# Patient Record
Sex: Male | Born: 1950
Health system: Southern US, Community
[De-identification: ages and names within clinical notes are randomized; demographics above are authoritative.]

## PROBLEM LIST (undated history)

## (undated) DIAGNOSIS — E785 Hyperlipidemia, unspecified: Secondary | ICD-10-CM

## (undated) DIAGNOSIS — I5042 Chronic combined systolic (congestive) and diastolic (congestive) heart failure: Secondary | ICD-10-CM

## (undated) DIAGNOSIS — E119 Type 2 diabetes mellitus without complications: Secondary | ICD-10-CM

## (undated) DIAGNOSIS — I10 Essential (primary) hypertension: Secondary | ICD-10-CM

## (undated) DIAGNOSIS — F419 Anxiety disorder, unspecified: Secondary | ICD-10-CM

## (undated) DIAGNOSIS — I739 Peripheral vascular disease, unspecified: Secondary | ICD-10-CM

## (undated) DIAGNOSIS — J189 Pneumonia, unspecified organism: Secondary | ICD-10-CM

## (undated) DIAGNOSIS — I2510 Atherosclerotic heart disease of native coronary artery without angina pectoris: Secondary | ICD-10-CM

## (undated) DIAGNOSIS — M1990 Unspecified osteoarthritis, unspecified site: Secondary | ICD-10-CM

## (undated) DIAGNOSIS — I214 Non-ST elevation (NSTEMI) myocardial infarction: Secondary | ICD-10-CM

## (undated) DIAGNOSIS — I25119 Atherosclerotic heart disease of native coronary artery with unspecified angina pectoris: Secondary | ICD-10-CM

## (undated) DIAGNOSIS — R079 Chest pain, unspecified: Secondary | ICD-10-CM

## (undated) HISTORY — DX: Peripheral vascular disease, unspecified: I73.9

## (undated) HISTORY — PX: CORONARY ANGIOPLASTY WITH STENT PLACEMENT: SHX49

## (undated) HISTORY — PX: CORONARY ARTERY BYPASS GRAFT: SHX141

## (undated) HISTORY — DX: Type 2 diabetes mellitus without complications: E11.9

## (undated) HISTORY — DX: Non-ST elevation (NSTEMI) myocardial infarction: I21.4

## (undated) HISTORY — DX: Atherosclerotic heart disease of native coronary artery with unspecified angina pectoris: I25.119

## (undated) HISTORY — PX: COLONOSCOPY: SHX174

## (undated) HISTORY — PX: SHOULDER SURGERY: SHX246

---

## 2000-07-03 ENCOUNTER — Encounter: Admission: RE | Admit: 2000-07-03 | Discharge: 2000-10-01 | Payer: Self-pay | Admitting: Internal Medicine

## 2000-07-11 ENCOUNTER — Encounter: Payer: Self-pay | Admitting: Neurology

## 2000-07-11 ENCOUNTER — Encounter: Admission: RE | Admit: 2000-07-11 | Discharge: 2000-07-11 | Payer: Self-pay | Admitting: Neurology

## 2004-07-19 ENCOUNTER — Encounter: Admission: RE | Admit: 2004-07-19 | Discharge: 2004-07-19 | Payer: Self-pay | Admitting: Orthopedic Surgery

## 2004-07-21 ENCOUNTER — Ambulatory Visit (HOSPITAL_BASED_OUTPATIENT_CLINIC_OR_DEPARTMENT_OTHER): Admission: RE | Admit: 2004-07-21 | Discharge: 2004-07-21 | Payer: Self-pay | Admitting: Orthopedic Surgery

## 2004-07-21 ENCOUNTER — Ambulatory Visit (HOSPITAL_COMMUNITY): Admission: RE | Admit: 2004-07-21 | Discharge: 2004-07-21 | Payer: Self-pay | Admitting: Orthopedic Surgery

## 2008-04-15 ENCOUNTER — Encounter: Admission: RE | Admit: 2008-04-15 | Discharge: 2008-04-15 | Payer: Self-pay | Admitting: Interventional Cardiology

## 2008-04-18 ENCOUNTER — Inpatient Hospital Stay (HOSPITAL_BASED_OUTPATIENT_CLINIC_OR_DEPARTMENT_OTHER): Admission: RE | Admit: 2008-04-18 | Discharge: 2008-04-18 | Payer: Self-pay | Admitting: Internal Medicine

## 2008-04-21 ENCOUNTER — Ambulatory Visit: Payer: Self-pay | Admitting: Thoracic Surgery (Cardiothoracic Vascular Surgery)

## 2008-04-23 ENCOUNTER — Encounter: Payer: Self-pay | Admitting: Thoracic Surgery (Cardiothoracic Vascular Surgery)

## 2008-04-23 ENCOUNTER — Ambulatory Visit (HOSPITAL_COMMUNITY)
Admission: RE | Admit: 2008-04-23 | Discharge: 2008-04-23 | Payer: Self-pay | Admitting: Thoracic Surgery (Cardiothoracic Vascular Surgery)

## 2008-04-25 ENCOUNTER — Ambulatory Visit: Payer: Self-pay | Admitting: Thoracic Surgery (Cardiothoracic Vascular Surgery)

## 2008-04-25 ENCOUNTER — Inpatient Hospital Stay (HOSPITAL_COMMUNITY)
Admission: RE | Admit: 2008-04-25 | Discharge: 2008-04-29 | Payer: Self-pay | Admitting: Thoracic Surgery (Cardiothoracic Vascular Surgery)

## 2008-05-02 ENCOUNTER — Ambulatory Visit: Payer: Self-pay | Admitting: Thoracic Surgery (Cardiothoracic Vascular Surgery)

## 2008-05-19 ENCOUNTER — Ambulatory Visit: Payer: Self-pay | Admitting: Thoracic Surgery (Cardiothoracic Vascular Surgery)

## 2008-05-19 ENCOUNTER — Encounter
Admission: RE | Admit: 2008-05-19 | Discharge: 2008-05-19 | Payer: Self-pay | Admitting: Thoracic Surgery (Cardiothoracic Vascular Surgery)

## 2008-05-21 ENCOUNTER — Inpatient Hospital Stay (HOSPITAL_COMMUNITY): Admission: EM | Admit: 2008-05-21 | Discharge: 2008-05-23 | Payer: Self-pay | Admitting: Emergency Medicine

## 2008-05-21 ENCOUNTER — Ambulatory Visit: Payer: Self-pay | Admitting: Cardiology

## 2008-05-30 ENCOUNTER — Ambulatory Visit: Payer: Self-pay | Admitting: Cardiothoracic Surgery

## 2008-07-28 ENCOUNTER — Ambulatory Visit (HOSPITAL_COMMUNITY): Admission: RE | Admit: 2008-07-28 | Discharge: 2008-07-28 | Payer: Self-pay | Admitting: Interventional Cardiology

## 2010-03-07 ENCOUNTER — Emergency Department (HOSPITAL_COMMUNITY): Payer: BC Managed Care – PPO

## 2010-03-07 ENCOUNTER — Emergency Department (HOSPITAL_COMMUNITY)
Admission: EM | Admit: 2010-03-07 | Discharge: 2010-03-07 | Disposition: A | Discharge status: Home / Self Care | Payer: BC Managed Care – PPO | Attending: Emergency Medicine | Admitting: Emergency Medicine

## 2010-03-07 DIAGNOSIS — R002 Palpitations: Secondary | ICD-10-CM | POA: Insufficient documentation

## 2010-03-07 DIAGNOSIS — I10 Essential (primary) hypertension: Secondary | ICD-10-CM | POA: Insufficient documentation

## 2010-03-07 DIAGNOSIS — R0602 Shortness of breath: Secondary | ICD-10-CM | POA: Insufficient documentation

## 2010-03-07 DIAGNOSIS — R0989 Other specified symptoms and signs involving the circulatory and respiratory systems: Secondary | ICD-10-CM | POA: Insufficient documentation

## 2010-03-07 DIAGNOSIS — Z7982 Long term (current) use of aspirin: Secondary | ICD-10-CM | POA: Insufficient documentation

## 2010-03-07 DIAGNOSIS — E7800 Pure hypercholesterolemia, unspecified: Secondary | ICD-10-CM | POA: Insufficient documentation

## 2010-03-07 DIAGNOSIS — R0609 Other forms of dyspnea: Secondary | ICD-10-CM | POA: Insufficient documentation

## 2010-03-07 DIAGNOSIS — Z79899 Other long term (current) drug therapy: Secondary | ICD-10-CM | POA: Insufficient documentation

## 2010-03-07 DIAGNOSIS — I252 Old myocardial infarction: Secondary | ICD-10-CM | POA: Insufficient documentation

## 2010-03-07 DIAGNOSIS — Z951 Presence of aortocoronary bypass graft: Secondary | ICD-10-CM | POA: Insufficient documentation

## 2010-03-07 DIAGNOSIS — E119 Type 2 diabetes mellitus without complications: Secondary | ICD-10-CM | POA: Insufficient documentation

## 2010-03-07 LAB — DIFFERENTIAL
Lymphs Abs: 1.8 10*3/uL (ref 0.7–4.0)
Monocytes Absolute: 0.9 10*3/uL (ref 0.1–1.0)
Monocytes Relative: 10 % (ref 3–12)
Neutro Abs: 6.1 10*3/uL (ref 1.7–7.7)
Neutrophils Relative %: 67 % (ref 43–77)

## 2010-03-07 LAB — BASIC METABOLIC PANEL
Chloride: 100 mEq/L (ref 96–112)
GFR calc Af Amer: >60 mL/min (ref >60)
Potassium: 3.9 mEq/L (ref 3.5–5.1)
Sodium: 133 mEq/L — ABNORMAL LOW (ref 135–145)

## 2010-03-07 LAB — CBC
Hemoglobin: 15.3 g/dL (ref 13.0–17.0)
MCH: 30.5 pg (ref 26.0–34.0)
MCHC: 35.7 g/dL (ref 30.0–36.0)
MCV: 85.5 fL (ref 78.0–100.0)
RBC: 5.02 MIL/uL (ref 4.22–5.81)

## 2010-03-07 LAB — POCT CARDIAC MARKERS: Troponin i, poc: <0.05 ng/mL (ref 0.00–0.09)

## 2010-04-11 LAB — GLUCOSE, CAPILLARY
Glucose-Capillary: 133 mg/dL — ABNORMAL HIGH (ref 70–99)
Glucose-Capillary: 173 mg/dL — ABNORMAL HIGH (ref 70–99)

## 2010-04-13 LAB — BASIC METABOLIC PANEL
BUN: 7 mg/dL (ref 6–23)
CO2: 23 mEq/L (ref 19–32)
Chloride: 102 mEq/L (ref 96–112)
Chloride: 97 mEq/L (ref 96–112)
GFR calc Af Amer: >60 mL/min (ref >60)
Glucose, Bld: 173 mg/dL — ABNORMAL HIGH (ref 70–99)
Potassium: 4.1 mEq/L (ref 3.5–5.1)
Potassium: 4.4 mEq/L (ref 3.5–5.1)
Sodium: 134 mEq/L — ABNORMAL LOW (ref 135–145)

## 2010-04-13 LAB — COMPREHENSIVE METABOLIC PANEL
ALT: 58 U/L — ABNORMAL HIGH (ref 0–53)
AST: 33 U/L (ref 0–37)
Alkaline Phosphatase: 118 U/L — ABNORMAL HIGH (ref 39–117)
Alkaline Phosphatase: 126 U/L — ABNORMAL HIGH (ref 39–117)
BUN: 9 mg/dL (ref 6–23)
CO2: 28 mEq/L (ref 19–32)
Calcium: 9.3 mg/dL (ref 8.4–10.5)
GFR calc Af Amer: >60 mL/min (ref >60)
GFR calc non Af Amer: >60 mL/min (ref >60)
Glucose, Bld: 181 mg/dL — ABNORMAL HIGH (ref 70–99)
Potassium: 4.1 mEq/L (ref 3.5–5.1)
Potassium: 4.2 mEq/L (ref 3.5–5.1)
Sodium: 129 mEq/L — ABNORMAL LOW (ref 135–145)
Total Bilirubin: 0.5 mg/dL (ref 0.3–1.2)
Total Protein: 7.1 g/dL (ref 6.0–8.3)
Total Protein: 7.1 g/dL (ref 6.0–8.3)

## 2010-04-13 LAB — CARDIAC PANEL(CRET KIN+CKTOT+MB+TROPI)
CK, MB: 1.7 ng/mL (ref 0.3–4.0)
Relative Index: INVALID (ref 0.0–2.5)
Relative Index: INVALID (ref 0.0–2.5)
Relative Index: INVALID (ref 0.0–2.5)
Total CK: 26 U/L (ref 7–232)
Total CK: 35 U/L (ref 7–232)

## 2010-04-13 LAB — DIFFERENTIAL
Basophils Absolute: 0 10*3/uL (ref 0.0–0.1)
Basophils Relative: 1 % (ref 0–1)
Monocytes Relative: 8 % (ref 3–12)
Neutro Abs: 6.2 10*3/uL (ref 1.7–7.7)
Neutrophils Relative %: 74 % (ref 43–77)

## 2010-04-13 LAB — HEPARIN LEVEL (UNFRACTIONATED): Heparin Unfractionated: 0.23 IU/mL — ABNORMAL LOW (ref 0.30–0.70)

## 2010-04-13 LAB — CBC
Hemoglobin: 12.9 g/dL — ABNORMAL LOW (ref 13.0–17.0)
MCHC: 34.2 g/dL (ref 30.0–36.0)
MCHC: 34.6 g/dL (ref 30.0–36.0)
Platelets: 187 10*3/uL (ref 150–400)
Platelets: 218 10*3/uL (ref 150–400)
RDW: 12.9 % (ref 11.5–15.5)
RDW: 13 % (ref 11.5–15.5)

## 2010-04-13 LAB — GLUCOSE, CAPILLARY: Glucose-Capillary: 140 mg/dL — ABNORMAL HIGH (ref 70–99)

## 2010-04-13 LAB — CK TOTAL AND CKMB (NOT AT ARMC)
Relative Index: INVALID (ref 0.0–2.5)
Total CK: 43 U/L (ref 7–232)

## 2010-04-13 LAB — PROTIME-INR
INR: 1.1 (ref 0.00–1.49)
Prothrombin Time: 14.3 seconds (ref 11.6–15.2)

## 2010-04-14 LAB — GLUCOSE, CAPILLARY
Glucose-Capillary: 111 mg/dL — ABNORMAL HIGH (ref 70–99)
Glucose-Capillary: 132 mg/dL — ABNORMAL HIGH (ref 70–99)
Glucose-Capillary: 136 mg/dL — ABNORMAL HIGH (ref 70–99)
Glucose-Capillary: 154 mg/dL — ABNORMAL HIGH (ref 70–99)
Glucose-Capillary: 157 mg/dL — ABNORMAL HIGH (ref 70–99)
Glucose-Capillary: 165 mg/dL — ABNORMAL HIGH (ref 70–99)
Glucose-Capillary: 167 mg/dL — ABNORMAL HIGH (ref 70–99)
Glucose-Capillary: 168 mg/dL — ABNORMAL HIGH (ref 70–99)
Glucose-Capillary: 174 mg/dL — ABNORMAL HIGH (ref 70–99)
Glucose-Capillary: 178 mg/dL — ABNORMAL HIGH (ref 70–99)
Glucose-Capillary: 179 mg/dL — ABNORMAL HIGH (ref 70–99)
Glucose-Capillary: 183 mg/dL — ABNORMAL HIGH (ref 70–99)
Glucose-Capillary: 98 mg/dL (ref 70–99)

## 2010-04-14 LAB — CBC
HCT: 26.4 % — ABNORMAL LOW (ref 39.0–52.0)
HCT: 28.7 % — ABNORMAL LOW (ref 39.0–52.0)
HCT: 31.1 % — ABNORMAL LOW (ref 39.0–52.0)
HCT: 31.2 % — ABNORMAL LOW (ref 39.0–52.0)
HCT: 31.7 % — ABNORMAL LOW (ref 39.0–52.0)
Hemoglobin: 11 g/dL — ABNORMAL LOW (ref 13.0–17.0)
Hemoglobin: 11 g/dL — ABNORMAL LOW (ref 13.0–17.0)
Hemoglobin: 15.8 g/dL (ref 13.0–17.0)
Hemoglobin: 9.3 g/dL — ABNORMAL LOW (ref 13.0–17.0)
Hemoglobin: 9.6 g/dL — ABNORMAL LOW (ref 13.0–17.0)
Hemoglobin: 9.9 g/dL — ABNORMAL LOW (ref 13.0–17.0)
MCHC: 35.3 g/dL (ref 30.0–36.0)
MCHC: 35.3 g/dL (ref 30.0–36.0)
MCHC: 35.4 g/dL (ref 30.0–36.0)
MCV: 88 fL (ref 78.0–100.0)
MCV: 88.3 fL (ref 78.0–100.0)
MCV: 88.7 fL (ref 78.0–100.0)
MCV: 89.8 fL (ref 78.0–100.0)
Platelets: 101 10*3/uL — ABNORMAL LOW (ref 150–400)
RBC: 3 MIL/uL — ABNORMAL LOW (ref 4.22–5.81)
RBC: 3.06 MIL/uL — ABNORMAL LOW (ref 4.22–5.81)
RBC: 3.53 MIL/uL — ABNORMAL LOW (ref 4.22–5.81)
RBC: 5.17 MIL/uL (ref 4.22–5.81)
RDW: 12 % (ref 11.5–15.5)
RDW: 12.8 % (ref 11.5–15.5)
WBC: 10 10*3/uL (ref 4.0–10.5)
WBC: 11.7 10*3/uL — ABNORMAL HIGH (ref 4.0–10.5)
WBC: 9.1 10*3/uL (ref 4.0–10.5)
WBC: 9.8 10*3/uL (ref 4.0–10.5)

## 2010-04-14 LAB — COMPREHENSIVE METABOLIC PANEL
ALT: 44 U/L (ref 0–53)
Alkaline Phosphatase: 76 U/L (ref 39–117)
CO2: 19 mEq/L (ref 19–32)
Glucose, Bld: 147 mg/dL — ABNORMAL HIGH (ref 70–99)
Potassium: 4.7 mEq/L (ref 3.5–5.1)
Sodium: 132 mEq/L — ABNORMAL LOW (ref 135–145)
Total Protein: 6.8 g/dL (ref 6.0–8.3)

## 2010-04-14 LAB — POCT I-STAT GLUCOSE: Glucose, Bld: 153 mg/dL — ABNORMAL HIGH (ref 70–99)

## 2010-04-14 LAB — POCT I-STAT, CHEM 8
BUN: 10 mg/dL (ref 6–23)
BUN: 9 mg/dL (ref 6–23)
Calcium, Ion: 1.2 mmol/L (ref 1.12–1.32)
Chloride: 103 mEq/L (ref 96–112)
Chloride: 97 mEq/L (ref 96–112)
Creatinine, Ser: 0.8 mg/dL (ref 0.4–1.5)
Glucose, Bld: 155 mg/dL — ABNORMAL HIGH (ref 70–99)
HCT: 33 % — ABNORMAL LOW (ref 39.0–52.0)
Potassium: 4.3 mEq/L (ref 3.5–5.1)
Potassium: 5.1 mEq/L (ref 3.5–5.1)
Sodium: 133 mEq/L — ABNORMAL LOW (ref 135–145)
TCO2: 20 mmol/L (ref 0–100)

## 2010-04-14 LAB — POCT I-STAT 3, ART BLOOD GAS (G3+)
Acid-base deficit: 3 mmol/L — ABNORMAL HIGH (ref 0.0–2.0)
Bicarbonate: 19.3 mEq/L — ABNORMAL LOW (ref 20.0–24.0)
O2 Saturation: 99 %
Patient temperature: 38.3
TCO2: 20 mmol/L (ref 0–100)
pCO2 arterial: 31.6 mmHg — ABNORMAL LOW (ref 35.0–45.0)
pCO2 arterial: 39.4 mmHg (ref 35.0–45.0)
pH, Arterial: 7.361 (ref 7.350–7.450)
pO2, Arterial: 351 mmHg — ABNORMAL HIGH (ref 80.0–100.0)

## 2010-04-14 LAB — BLOOD GAS, ARTERIAL
Drawn by: 181601
FIO2: 0.21 %
pCO2 arterial: 34 mmHg — ABNORMAL LOW (ref 35.0–45.0)
pO2, Arterial: 84.5 mmHg (ref 80.0–100.0)

## 2010-04-14 LAB — MAGNESIUM: Magnesium: 2.1 mg/dL (ref 1.5–2.5)

## 2010-04-14 LAB — POCT I-STAT 4, (NA,K, GLUC, HGB,HCT)
Glucose, Bld: 115 mg/dL — ABNORMAL HIGH (ref 70–99)
Glucose, Bld: 118 mg/dL — ABNORMAL HIGH (ref 70–99)
Glucose, Bld: 122 mg/dL — ABNORMAL HIGH (ref 70–99)
Glucose, Bld: 148 mg/dL — ABNORMAL HIGH (ref 70–99)
HCT: 31 % — ABNORMAL LOW (ref 39.0–52.0)
HCT: 38 % — ABNORMAL LOW (ref 39.0–52.0)
Hemoglobin: 14.6 g/dL (ref 13.0–17.0)
Hemoglobin: 8.8 g/dL — ABNORMAL LOW (ref 13.0–17.0)
Potassium: 4 mEq/L (ref 3.5–5.1)
Potassium: 4.3 mEq/L (ref 3.5–5.1)
Potassium: 4.9 mEq/L (ref 3.5–5.1)
Sodium: 133 mEq/L — ABNORMAL LOW (ref 135–145)
Sodium: 134 mEq/L — ABNORMAL LOW (ref 135–145)
Sodium: 135 mEq/L (ref 135–145)

## 2010-04-14 LAB — TYPE AND SCREEN: Antibody Screen: NEGATIVE

## 2010-04-14 LAB — BASIC METABOLIC PANEL
BUN: 13 mg/dL (ref 6–23)
Calcium: 8 mg/dL — ABNORMAL LOW (ref 8.4–10.5)
Chloride: 97 mEq/L (ref 96–112)
GFR calc Af Amer: >60 mL/min (ref >60)
GFR calc non Af Amer: >60 mL/min (ref >60)
GFR calc non Af Amer: >60 mL/min (ref >60)
Potassium: 4 mEq/L (ref 3.5–5.1)
Potassium: 4.5 mEq/L (ref 3.5–5.1)
Sodium: 129 mEq/L — ABNORMAL LOW (ref 135–145)
Sodium: 131 mEq/L — ABNORMAL LOW (ref 135–145)

## 2010-04-14 LAB — CREATININE, SERUM
GFR calc Af Amer: >60 mL/min (ref >60)
GFR calc non Af Amer: >60 mL/min (ref >60)

## 2010-04-14 LAB — HEMOGLOBIN AND HEMATOCRIT, BLOOD: Hemoglobin: 9.5 g/dL — ABNORMAL LOW (ref 13.0–17.0)

## 2010-04-14 LAB — URINALYSIS, ROUTINE W REFLEX MICROSCOPIC
Bilirubin Urine: NEGATIVE
Nitrite: NEGATIVE
Specific Gravity, Urine: 1.02 (ref 1.005–1.030)
pH: 6 (ref 5.0–8.0)

## 2010-04-14 LAB — APTT: aPTT: 25 seconds (ref 24–37)

## 2010-04-14 LAB — HEMOGLOBIN A1C: Hgb A1c MFr Bld: 6.6 % — ABNORMAL HIGH (ref 4.6–6.1)

## 2010-05-18 NOTE — Op Note (Signed)
NAMEALEXIE, SAMSON NO.:  [id number]   MEDICAL RECORD NO.:  [id number]          PATIENT TYPE:  INP   LOCATION:  2311                         FACILITY:  [facility]   PHYSICIAN:  Salvatore Decent. Dorris Fetch, M.D.DATE OF BIRTH:  30-Mar-1950   DATE OF PROCEDURE:  04/25/2008  DATE OF DISCHARGE:                               OPERATIVE REPORT   PREOPERATIVE DIAGNOSIS:  Severe three-vessel coronary disease with  progressive angina.   POSTOPERATIVE DIAGNOSIS:  Severe three-vessel coronary disease with  progressive angina.   PROCEDURE:  Median sternotomy extracorporeal circulation coronary artery  bypass graft x5 (left internal mammary artery to left anterior  descending, saphenous vein graft to first diagonal, saphenous vein graft  to ramus intermedius, saphenous vein graft to posterior descending, left  radial artery to obtuse marginal), and endoscopic vein harvest, right  thigh.   SURGEON:  Salvatore Decent. Dorris Fetch, MD   ASSISTANT:  Doree Fudge, PA   SECOND ASSISTANT:  Stephanie Acre. Dasovich, PAC   ANESTHESIA:  General.   FINDINGS:  Left radial, small but good quality; left mammary, large,  good quality; saphenous vein, good quality; ramus intermedius  intramyocardial coronaries, good quality targets with the exception of  the obtuse marginal which was a 1-mm fair-to-poor quality target.   CLINICAL NOTE:  Mr. Lia is a 60 year old gentleman with known  coronary disease who has had recent progression of symptoms from class  II to class III angina.  He had a positive stress test and cardiac  catheterization was found to have severe three-vessel coronary disease.  He was advised to undergo coronary artery bypass grafting.  The  indications, risks, benefits, and alternatives were discussed in detail  with the patient.  He understood and accepted the risks and agreed to  proceed.   OPERATIVE NOTE:  Mr. Fristoe was brought to the preop holding area on  April 25, 2008.   There lines were placed by Anesthesia for monitoring  arterial, central venous, and pulmonary arterial pressures.  Intravenous  antibiotics were administered.  The patient was taken to the operating  room, anesthetized, and intubated.  A Foley catheter was placed.  The  chest, abdomen, and legs were prepped and draped in the usual fashion.  An incision was made to the medial aspect of the right leg at the level  of the greater saphenous vein was harvested endoscopically from the  right thigh and 2000 units of heparin was administered during the vessel  harvest.  Simultaneously, a incision was made in the volar aspect of the  left wrist and extended just below the antecubital fossa.  The left  radial artery was harvested using the harmonic scalpel.  The patient had  confirmed normal Allen's test preoperatively.  There was a good distal  pulse with proximal radial occlusion.   A median sternotomy was performed.  The left internal mammary artery was  harvested using standard technique.  There was a large caliber good  quality vessel.  There was excellent flow through the distal end of the  mammary artery when divided.   After harvesting the conduits, the left  radial artery incision was  closed.  The arm was tucked.  The leg incision was closed in standard  fashion.  The pericardium was opened.  The ascending aorta was  inspected.  There was no atherosclerotic disease.  The aorta was  cannulated via concentric two Ethibond pledgeted and purse-string  sutures.  A dual-stage venous cannula was placed via purse-string suture  at the right atrial appendage.  Cardiopulmonary bypass was instituted  and the patient was cooled to 32 degrees Celsius.  The coronary arteries  were inspected and anastomotic sites were chosen.  The conduits were  inspected and cut to length.  A foam pad was placed in the pericardium  to protect the left phrenic nerve and insulate the heart.  A temperature  probe was  placed in myocardial septum and a cardioplegic cannula was  placed in the ascending aorta.   The aorta was cross-clamped.  The left ventricle was emptied via the  aortic root and cardiac arrest was then achieved with a combination of  cold antegrade blood cardioplegia and topical iced saline.  A 600 mL of  cardioplegia was administered initially.  The myocardial septal  temperature fell to 9 degrees Celsius.  The following distal anastomoses  were performed.   First a reversed saphenous vein graft was placed end-to-side to the  posterior descending branch of the right coronary.  This was a 1.5 mm  vessel.  It was a good quality target.  The vein graft was anastomosed  end-to-side with a running 7-0 Prolene suture.  At the completion of  each vein graft, it was probed proximally and distally to ensure  patency.  Cardioplegia then was administered to assess flow and  hemostasis.   Next, a reverse saphenous vein graft was placed end-to-side to the ramus  intermedius.  This was a 1.5 mm vessel that was plaquing but was still  good quality.  This vessel was intramyocardial.  The vein graft was  anastomosed end-to-side with a running 7-0 Prolene suture.   Next, a reverse saphenous vein graft was placed end-to-side to the first  diagonal branch of the LAD.  This was a 1.5 mm vessel that was of good  quality and had a 75% stenosis proximally.  The vein was anastomosed end-  to-side with a running 7-0 Prolene suture.  There was good flow through  the graft with good hemostasis.   Next, the radial artery was beveled at its distal end.  As noted, it was  a small vessel but had had good flow in the arm and divided distally and  was a suitable size match for the small obtuse marginal branch.  There  were multiple obtuse marginal branches.  This vessel was nearly worn  that was even close to consideration for grafting.  It was diffusely  diseased proximally but was a 1-mm fair-quality target  distally.  The  radial to obtuse marginal anastomosis was performed with a running 8-0  Prolene suture.  It was probed proximally and distally to ensure patency  with cardioplegia administration down the aortic root.  There was good  backbleeding from the radial artery.   Next, the left internal mammary artery was brought through a window in  the pericardium.  The distal end was beveled and was anastomosed end-to-  side to the LAD.  The LAD was a 2-mm good quality target site  anastomosis.  The mammary was a 2.5 mm good quality conduit.  The  anastomosis was performed with a  running 8-0 Prolene suture.  At the  completion of the mammary to LAD anastomosis, the bulldog clamp was  briefly removed to inspect for hemostasis.  Immediate rapid septal  rewarming was noted.  The bulldog clamp was replaced and the mammary  pedicle was tacked to the epicardial surface of the heart with 6-0  Prolene sutures.   Additional cardioplegia was administered.  The vein grafts were cut to  length.  The proximal vein graft anastomoses were performed to 4.5 mm  punch aortotomies with running 6-0 Prolene sutures.  The radial artery  was too small to anastomose directly to the aorta.  At the completion of  final proximal anastomosis, the patient was placed in Trendelenburg  position.  Lidocaine was administered.  The aortic root was de-aired and  the aortic cross-clamp was removed.  Total cross-clamp time was 75  minutes.  The patient required a single defibrillation with 20 joules  and remained in sinus rhythm thereafter.  Bulldog clamps were placed  proximally and distally on the vein graft to the ramus intermedius.  A  venotomy was made at the proximal anastomosis for the radial was placed  close to the origin of the ramus vein graft from the aorta with a  running 7-0 Prolene suture.  The anastomosis was de-aired.  The bulldog  clamps were removed.  While the patient was being rewarmed, all proximal  and  distal anastomoses were inspected for hemostasis.  Epicardial pacing  wires were placed on the right ventricle and right atrium.  When the  patient rewarmed to a core temperature of 37 degrees Celsius, he was  weaned from cardiopulmonary bypass on the first attempt.  The total  bypass time was 127 minutes.  The patient had initial cardiac output of  3 L per minute, but then subsequently improved to 5 L per minute with  volume administration.  The patient remained hemodynamically stable  thereafter.   A test dose of protamine was administered and was well tolerated.  The  atrial and aortic cannulae were removed.  The remainder of the protamine  was administered without incident.  The chest was irrigated with 1 L of  warm normal saline containing 1 gram of vancomycin.  Hemostasis was  achieved.  The pericardium was reapproximated with interrupted 3-0 silk  sutures and came together easily without tension.  The left pleural and  mediastinal chest tubes were placed through separate subcostal  incisions.  The sternum was closed with interrupted heavy gauge  stainless steel wires.  The pectoralis fascia, subcutaneous tissue, and  skin were closed in standard fashion.  All sponge, needle, and  instrument counts were correct at the end of the procedure.  The patient  was taken from the operating room to the Surgical Intensive Care Unit in  critical but stable condition.      Salvatore Decent Dorris Fetch, M.D.  Electronically Signed     SCH/MEDQ  D:  04/25/2008  T:  04/26/2008  Job:  [id number]   cc:   Lyn Records, M.D.  Thora Lance, M.D.

## 2010-05-18 NOTE — H&P (Signed)
HISTORY AND PHYSICAL EXAMINATION   April 21, 2008   Re:  LINK, BURGESON            DOB:  November 15, 1950   CHIEF COMPLAINT:  The patient is a 60 year old gentleman with a chief  complaint of chest pain.   HISTORY OF PRESENT ILLNESS:  The patient is a 60 year old gentleman with  a history of hypertension, hyperlipidemia, type 2 diabetes and known  coronary disease.  He has a had a chronically totally occluded left  circumflex, which had previously been treated medically.  He noticed  this winter with a cold that he was having more chest tightness since  over the past several winters.  He has had chest tightness when it is  cold, and he is exerting himself, but this year was a little more strain  than it had been in the past.  About 2 weeks ago, he was attending to  some horses, and he got a severe episode of chest pain.  He stopped what  he was doing and sat down.  He rested about 10 minutes and the pain  resolved.  However, we got back up and tried to start again, the pain  came back, he eventually took the nitroglycerin and the pain resolved.  He had a stress test done, which showed an inferior infarction with a  large region of peripheral ischemia.  There was basal-to-mid anterior  wall ischemia, which was new and his ejection fraction was 77%.  On  April 18, 2008, he underwent coronary angiography, which revealed severe  three-vessel disease.  He had about a 40% distal left main.  His LAD was  heavily calcified.  There was 80% stenosis from the LAD after the  takeoff of the large diagonal branch.  This had an 80%-90% stenosis.  Circumflex was totally occluded with left-to-left and left-to-right  collaterals.  Ramus intermedius had a proximal 75% stenosis.  Right  coronary was diffusely diseased and had a 90% stenosis proximal to the  posterior descending.   The patient has had some shortness of breath with lying flat but has not  had any nocturnal or rest anginal  symptoms.   PAST MEDICAL HISTORY:  Significant for coronary artery disease,  hypertension, hyperlipidemia, type 2 non-insulin-dependent diabetes for  10-15 years.  He has had a previous heart attack.   CURRENT MEDICATIONS:  1. Quinaretic 20/12.5 one tablet daily.  2. Crestor 20 mg daily.  3. Toprol-XL 50 mg daily.  4. Cardizem 420 mg daily.  5. Aspirin 81 mg daily.  6. Metformin 1000 mg p.o. b.i.d.  7. Glimepiride 4 mg p.o. daily and nitroglycerin p.r.n.   ALLERGIES:  He has an allergy to codeine.   FAMILY HISTORY:  Strongly positive for coronary artery disease in his  mother and 3 brothers.   SOCIAL HISTORY:  He is married with 5 children.  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  He had a past history of  smoking about 45 pack years, 2-3 packs a day for 15 plus years.  He quit  in 1989.  He does not drink alcohol.   REVIEW OF SYSTEMS:  See HPI, otherwise complains of some cramping in his  calves when he walks.  Also has had right shoulder reconstruction and  recently hurt his left shoulder lifting something heavy.  All other  systems are negative.   PHYSICAL EXAMINATION:  VITAL SIGNS:  The patient is a 5 feet 6 inches  tall, 185 pounds.  His blood pressure is 144/81, pulse 61, respirations  were 18, his oxygen saturation is 93% on room air.  NEUROLOGICALLY:  He is alert, oriented x3.  GENERAL:  He is well developed and well nourished.  HEENT:  Unremarkable.  NECK:  Supple without thyromegaly, adenopathy, or bruits.  CARDIAC:  He has regular rate and rhythm.  Normal S1 and S2.  No rubs,  murmurs, or gallops.  LUNGS:  Clear with equal breath sounds.  ABDOMEN:  Soft, nontender.  EXTREMITIES:  Without clubbing, cyanosis, or edema.  He has a normal  Allen's test on the left.  He has 2+ dorsalis pedis pulses bilaterally.   LABORATORY DATA:  Stress test cardiac catheterization reviewed.  His  labs from April 15, 2008, had a white count 7.9, hematocrit 47,   platelets 180, glucose 125, BUN 10, creatinine 1.0.  Sodium 133,  potassium 4.1, albumin 4.4.  PT is 11.7 with an INR of 1.02.   IMPRESSION:  The patient is a 60 year old gentleman with multiple  cardiac risk factors and known coronary disease with a chronically  totally occluded left circumflex.  He now presents with progression of  angina from class II to class III with a positive stress test and  cardiac catheterization showing severe three-vessel coronary artery  disease.  Coronary artery bypass grafting is indicated for survival  benefit as well as relief of symptoms.  I have discussed in detail with  the patient and his wife.  Indications, risks, benefits, and  alternatives.  They understand the general conduct of the operation.  They understand the reasoning behind using left radial artery, in  addition to the  mammary artery as well as an endoscopic harvest of the  saphenous vein.  They understand the expected hospital stay and overall  recovery.   I discussed in detail with him the risks of the procedure that  understands the risks include but not limited to death, stroke, MI, DVT,  PE, bleeding, possible need for transfusions, infections as well as  other organ system dysfunction including respiratory, renal, or GI  complications.  He understands and accepts these risks and agrees to  proceed.  We have scheduled him for Friday, April 23.  He will be  admitted on the day of surgery.   Salvatore Decent Dorris Fetch, M.D.  Electronically Signed   SCH/MEDQ  D:  04/21/2008  T:  04/22/2008  Job:  [id number]   cc:   Lyn Records, M.D.  Thora Lance, M.D.

## 2010-05-18 NOTE — Cardiovascular Report (Signed)
NAMEJHAIR, WITHERINGTON NO.:  [id number]   MEDICAL RECORD NO.:  [id number]          PATIENT TYPE:  OIB   LOCATION:  1961                         FACILITY:  [facility]   PHYSICIAN:  Lyn Records, M.D.   DATE OF BIRTH:  October 08, 1950   DATE OF PROCEDURE:  04/18/2008  DATE OF DISCHARGE:                            CARDIAC CATHETERIZATION   INDICATIONS FOR PROCEDURE:  Recent progression and angina with  increasing angina grade 2 class III.  Known history of occlusion of the  circumflex collateralized from the left and right coronary.  Recent  Cardiolite study demonstrating a large area of inferior ischemia and a  new anterolateral ischemia.   PROCEDURES PERFORMED:  1. Left heart catheterization.  2. Coronary angiography.  3. Left ventriculography.   DESCRIPTION:  After informed consent, a 4-French sheath was placed in  the right femoral artery using the modified Seldinger technique.  A 4-  [place] A2 multipurpose catheter was used for left ventriculography by  hand injection.  Hemodynamic recordings were performed with this  catheter.  A #4 4-French left and right coronary catheter was used for  left and right coronary angiography.  The patient tolerated the  procedure without complications.   RESULTS:  1. Hemodynamic data:      a.     Aortic pressure 141/74 mmHg.      b.     Left ventricular pressure 143/17.  2. Left ventriculography:  The LV cavity size and systolic function      are normal.  The EF is 60%.  No regional wall motion abnormalities      noted.  3. Coronary angiography:      a.     Left main coronary:  30-40% distal narrowing.      b.     Left anterior descending coronary:  Heavily calcified.  The       LAD branches into a near codominant diagonal LAD.  The LAD wraps       around the left ventricular apex.  The LAD distal to the       bifurcation with the diagonal contains an eccentric 75-80%       stenosis.  The large diagonal contains an eccentric  80-90%       stenosis.  The diagonal supplies collaterals to the large obtuse       margin of the circumflex territory.      c.     Circumflex artery:  Totally occluded proximally.      d.     Ramus intermedius branch:  Eccentric 75-80% stenosis in its       proximal segment.      e.     Right coronary:  The right coronary is severely and       diffusely diseased throughout its entire length but contains 85-       90% stenosis proximal to the PDA.  The midvessel contains 50-60%       narrowing after the first right ventricular branch.  The first       right ventricular branch contains a 95%  stenosis.   CONCLUSIONS:  1. Severe three-vessel coronary disease with heavy calcification in      the proximal portions of all three coronaries.  There is total      occlusion of the circumflex, the circumflex is collateralized from      the diagonal and the right coronary.  The diagonal contains an 85-      90% stenosis, the left anterior descending contains 70-80%      stenosis, a ramus branch contains a 70-80% stenosis, and the distal      right coronary contains 90% stenosis before the      posterior descending artery.  2. Normal left ventricular function.   PLAN:  Surgical revascularization.      Lyn Records, M.D.  Electronically Signed     HWS/MEDQ  D:  04/18/2008  T:  04/18/2008  Job:  [id number]   cc:   [facility]

## 2010-05-18 NOTE — Discharge Summary (Signed)
[name], [name] NO.:  [id number]   MEDICAL RECORD NO.:  [id number]          PATIENT TYPE:  INP   LOCATION:  3701                         FACILITY:  [facility]   PHYSICIAN:  [name] [name], M.D.   DATE OF BIRTH:  [birth year]   DATE OF ADMISSION:  05/21/2008  DATE OF DISCHARGE:  05/23/2008                               DISCHARGE SUMMARY   DISCHARGE DIAGNOSES:  1. Chest pain, not felt to be cardiac.  2. Known coronary artery disease, history of coronary artery bypass      grafting.  3. Diabetes mellitus.  4. Hypertension.  5. Hyperlipidemia.  6. Hyponatremia.  7. Elevated D-dimer with negative pulmonary embolism on CT.   HOSPITAL COURSE:  [name] [name] is a 60 year old male patient who has  severe 3-vessel coronary artery disease and has undergone bypass surgery  who went for a walk with his wife and developed an achiness left  shoulder, which radiated to the left jaw.  He stopped and took a  sublingual nitroglycerin with resolution.  Later, he started walking to  his house, to go to his computer and had sudden onset of shortness of  breath and palpitations.  He took 1 sublingual nitroglycerin with  relief.  His symptoms are not similar to what he has had before his  bypass surgery.  Of note, his grafts were as follows:  LIMA to LAD,  saphenous vein graft to the diagonal, saphenous vein graft to the OM,  saphenous vein graft to PDA, left radial artery to the OM.   He was admitted to the hospital and ruled out by cardiac enzymes.  D-  dimer was elevated, but the CT of the chest showed no central pulmonary  embolism.  He did have a normal sodium initially, but did drop to 129,  but he had no clinical abnormalities.  On discharge, sodium was 129,  potassium 4.4, BUN 7, and creatinine 0.8.  TSH 1.631.  Hemoglobin 12.5,  hematocrit 36.0, white count 7.4, and platelets 187.   DISCHARGE MEDICATIONS:  1. Enteric-coated aspirin 325 mg a day.  2. Toprol XL 50 mg a  day.  3. Quinaretic 20/12.5 mg a day.  4. Crestor 20 mg a day.  5. Glimepiride 4 mg a day.  6. Imdur 30 mg a day.  7. Cardizem daily as before.  8. Finish Keflex.  9. Sublingual nitroglycerin p.r.n. chest pain.  10.Restart metformin on May 25, 2008, secondary to diabetes mellitus.   He had contrast with his chest CT.  We will recheck a sodium level on  the return office visit.  Follow up with Dr. Effie Shy, nurse  practitioner on 06/05/2008 at 10:30 a.m.  Remain on a diabetic diet.  Increase activity slowly.      [name] [name], P.A.      [name] [name], M.D.  Electronically Signed    LB/MEDQ  D:  07/14/2008  T:  07/15/2008  Job:  [id number]

## 2010-05-18 NOTE — Discharge Summary (Signed)
NAMEEVERET, FLAGG NO.:  [id number]   MEDICAL RECORD NO.:  [id number]          PATIENT TYPE:  INP   LOCATION:  2024                         FACILITY:  [facility]   PHYSICIAN:  Salvatore Decent. Dorris Fetch, M.D.DATE OF BIRTH:  [birth year]   DATE OF ADMISSION:  04/25/2008  DATE OF DISCHARGE:  04/29/2008                               DISCHARGE SUMMARY   ADMITTING DIAGNOSES:  1. History of coronary artery disease.  2. History of myocardial infarction.  3. History of hypertension.  4. History of hyperlipidemia.  5. History of diabetes mellitus, type 2.  6. History of remote tobacco abuse (quit in 1989).   DISCHARGE DIAGNOSES:  1. History of coronary artery disease.  2. History of myocardial infarction.  3. History of hypertension.  4. History of hyperlipidemia.  5. History of diabetes mellitus, type 2.  6. History of remote tobacco abuse (quit in 1989).  7. Thrombocytopenia.  8. Postoperative ADL.   PROCEDURES:  Coronary artery bypass graft x5 (left internal mammary  artery to left anterior descending, saphenous vein graft to first  diagonal, saphenous vein graft to ramus intermedius, saphenous vein  graft to posterior descending artery, left radial artery to obtuse  marginal with endoscopic vein harvesting of the right thigh and open  harvest of the left radial artery by Dr. Dorris Fetch on April 25, 2008.   HISTORY OF PRESENTING ILLNESS:  This is a 60 year old Caucasian male  with a past medical history of known coronary artery disease, previous  MI, history of hypertension, history of hyperlipidemia, history of  diabetes mellitus who had a chronically totally occluded left  circumflex, which has been treated medically.  Over this past winter,  the patient noticed he began having more chest tightness.  This usually  occurs while he is exerting himself.  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  He  immediately stopped what he was doing, and after about 10 minutes,  the chest pain resolved.  He went to resume his work, and the chest pain  again recurred.  He took a nitroglycerin, and again, the pain resolved.  He then saw the cardiologist in followup.  A stress test was done, which  showed an inferior infarction with a large region of peripheral  ischemia.  There was basal to mid anterior wall ischemia, which was new,  and his ejection fraction was noted to be 77%.  On April 18, 2008, the  patient then underwent a cardiac catheterization, which revealed severe  3-vessel disease.  He had a 40% distal left main, his LAD was heavily  calcified, there was an 80% stenosis from the LAD after the takeoff of  the first diagonal, totally occluded circumflex with collaterals, ramus  intermediate had proximal 75% stenosis, and the right coronary artery  was diffusely disease with a 90% proximal stenosis.  The patient was  then referred to Dr. Dorris Fetch.  He saw the patient in the office on  April 21, 2008.  The patient was then admitted to [facility] on  April 25, 2008, to undergo the aforementioned coronary bypass grafting  surgery.   BRIEF HOSPITAL COURSE STAY:  The patient was extubated without  difficulty on the evening of surgery.  The patient remained afebrile and  hemodynamically stable.  He was weaned off his drips as tolerated.  A-  line, Swan, and Foley and chest tubes were all removed by April 26, 2008.  Followup chest x-ray revealed no pneumothorax, persistent  bilateral pleural effusions with atelectasis.  The patient was found to  have thrombocytopenia postoperatively (lowest platelet count 74,000).  Gradually, this did begin to increase.  He was not placed on Lovenox  postoperatively.  He was volume overloaded and diuresed accordingly.  He  was also found to have a postoperative acute blood loss anemia.  He did  not require any transfusion, and his H and H were monitored  closely.  He  was then transferred from the intensive care into [facility] for further  convalescence and currently on postop day #3, a T-max of 99 x1, but he  has been febrile in the last 16 hours, heart rate is in the 80s, BP was  117/67, O2 sat 94% on room air, preoperative weight 87.8 kg, today's  weight down to 91.9 kg, CBGs 157, 165, 160 respectively.   PHYSICAL EXAMINATION:  CARDIOVASCULAR:  Regular rate and rhythm.  PULMONARY:  Slightly wheezy at the bases, otherwise clear.  ABDOMEN:  Soft, nontender.  Bowel sounds present.  EXTREMITIES:  Mild lower extremity edema.  Wounds clean and dry.  Left  forearm wound, slight bloody ooze in mid incision, positive ecchymosis,  has some numbness in the last 3 fingers.  Pacing wires are going to  removed today provided the patient remains afebrile and hemodynamically  stable.  He will be discharged home on April 28, 2008.   LAST LABORATORY STUDIES:  BMET done on April 27, 2008, sodium 129,  potassium 4.5, BUN and creatinine 13 and 1.01 respectively.  Last CBC done also on this date, H and H were 9.9 and 28.7, white count  11,700, platelet count 8000.  CBC will be drawn in the morning prior to  discharge.  Last chest x-ray done on April 27, 2008, results as previously  discussed.   DISCHARGE INSTRUCTIONS:  The patient is not to drive or lift more than  10 pounds.  He is to continue with his breathing exercise daily.  He is  to walk every day and increase frequency and duration as tolerated.  He  is to remain on a low-fat, low-salt carbohydrate modified, medium  caloric diet.  He may shower.  He is to cleanse his wounds with mild  soap and water.  He is to call the office if any wound problems arise.   FOLLOWUP APPOINTMENTS:  1. The patient is to contact Dr. Michaelle Copas office a for followup      appointment in 2 weeks.  2. The patient has an appointment to see Dr. Sunday Corn PA on May 19, 2008, at 1:15 p.m.  Prior to this office  appointment, a chest x-      ray will be obtained.   DISCHARGE MEDICATIONS:  1. Enteric-coated aspirin 325 mg p.o. daily.  2. Toprol-XL 50 mg p.o. daily.  3. Quinaretic 20/12.5 mg p.o. daily.  4. Crestor 20 mg p.o. at bedtime.  5. Metformin 1000 mg p.o. 2 times daily.  6. Glimepiride 4 mg p.o. daily.  7. Imdur 30 mg p.o. daily.  8. Cardizem CD 420 mg p.o. daily.  9. Lasix 40 mg p.o. daily.  10.KCl 20 mEq p.o. daily.  It will be decided prior to discharge the number of days that the  potassium and Lasix are to be continued.  1. Darvocet-N 100 one to two tablets every 4-6 hours as needed for      pain.      Doree Fudge, PA      Viviann Spare C. Dorris Fetch, M.D.  Electronically Signed    DZ/MEDQ  D:  04/28/2008  T:  04/28/2008  Job:  [id number]   cc:   Thora Lance, M.D.  Lyn Records, M.D.

## 2010-05-18 NOTE — Assessment & Plan Note (Signed)
OFFICE VISIT   [name] [name], [name] [name]  DOB:  [birth year]                                        May 30, 2008  CHART #:  [id number]   CURRENT PROBLEMS:  1. Status post coronary artery bypass graft x5 with left radial artery      and left IMA on April 24, 2008, by Dr. Charlett Lango.  2. Diabetes mellitus.  3. Cellulitis of the left forearm incision, now resolved with      antibiotics.   PRESENT ILLNESS:  The patient returns for his final office visit after  undergoing multivessel coronary artery bypass revascularization by Dr.  Dorris Fetch in mid April.  He is doing well and is walking twice daily  at home without recurrent angina or symptoms of CHF.  He did have some  palpitations and shortness of breath approximately a week ago and was  hospitalized for 2 days and a CT angiogram at that time showed no  evidence of pulmonary emboli.  He has had no recurrent problems and he  states multiple test during the hospitalization were negative.  He  remains on his home medications including Crestor 20 mg, Toprol-XL 50  mg, Cardizem CD 420 mg, aspirin 325 mg, metformin b.i.d., and  glimepiride daily.   PHYSICAL EXAMINATION:  VITAL SIGNS:  Blood pressure 126/70, pulse 60,  respirations 18, and saturation 98%.  GENERAL:  He is alert and comfortable.  LUNGS:  Breath sounds are clear and equal.  CHEST:  The sternum is stable and well healed.  CARDIAC:  Rhythm is regular.  EXTREMITIES:  The left forearm incision is healed and left hand is warm  with a good grip.  There is no peripheral edema.   PLAN:  The patient will resume driving and light activities.  I provided  him with a return-to-work slip for July 28, 2008, 3 months after  surgery.  Until then he knows not to lift more than 20  pounds with a continuous walking program since he was so far from the  hospital and cannot practically tend outpatient cardiac rehab.   Kerin Perna, M.D.  Electronically Signed   PV/MEDQ  D:  05/30/2008  T:  05/31/2008  Job:  [id number]   cc:   Lyn Records, M.D.

## 2010-05-18 NOTE — Cardiovascular Report (Signed)
NAMEILLYA, GIENGER NO.:  [id number]   MEDICAL RECORD NO.:  [id number]          PATIENT TYPE:  OIB   LOCATION:  2899                         FACILITY:  [facility]   PHYSICIAN:  Lyn Records, M.D.   DATE OF BIRTH:  December 14, 1950   DATE OF PROCEDURE:  07/28/2008  DATE OF DISCHARGE:  07/28/2008                            CARDIAC CATHETERIZATION   INDICATION FOR THIS STUDY:  Recurrent angina following coronary artery  bypass grafting in April 2010.   PROCEDURE PERFORMED:  1. Left heart catheterization.  2. Left ventriculography.  3. Coronary angiography.  4. Vein graft angiography.  5. Left internal mammary graft angiography.   DESCRIPTION:  Versed 2 mg and 50 mcg of fentanyl was given in aliquots  to achieve conscious sedation.  Xylocaine 1% was used in the right  femoral for local anesthesia.  A 6-French sheath was then placed using a  modified Seldinger technique.  A 6-French A2 multipurpose catheter was  used for hemodynamic recordings, left ventriculography by hand  injection, and selective bypass graft angiography.  Native right  coronary angiography was performed with this catheter as well.   We removed the multipurpose catheter and performed saphenous vein bypass  graft angiography with a 6-French left bypass graft catheter.  A 6-  [place] internal mammary catheter was used for left internal mammary  artery angiography.  The patient tolerated the procedure without  complications.   RESULTS:  1. Hemodynamic data:      a.     Aortic pressure 103/60.      b.     Left ventricular pressure 103/10.  2. Left ventriculography:  The left ventricle is normal in size and      has normal contractility.  EF is 65%.  3. Coronary angiography.      a.     Left main coronary widely patent.      b.     Left anterior descending coronary artery:  Proximal vessel       with heavy calcification.  There is 70% proximal LAD eccentric       narrowing seen best in AP cranial  views just after the first       septal perforator.  The first diagonal is a moderate size vessel       with severe proximal and mid obstruction and TIMI grade 2 flow.       The bypass graft to this vessel is occluded.  The LAD beyond the       left internal mammary insertion site contains a relatively       concentric focal 85% stenosis.      c.     Circumflex artery:  The circumflex coronary artery is       totally occluded after the ramus intermedius origin.      d.     Ramus intermedius:  The ramus intermedius contains segmental       60-80% stenosis from its ostium to the end of the proximal       segment.  According to Dr. Sunday Corn note, the ramus was  intramyocardial.      e.     Right coronary artery:  The vessel is totally occluded       distally.  4. Bypass graft angiography:      a.     Saphenous vein graft to the diagonal:  Totally occluded to       the aorto-ostial junction.      b.     Saphenous vein graft to the ramus intermedius:  Totally       occluded.      c.     Free internal mammary artery graft of the saphenous vein       graft to the obtuse marginal:  Widely patent to the distal obtuse       marginal.  D.  Saphenous vein graft to the PDA:  40-50% proximal       narrowing.  5. Left internal mammary to the LAD:  The graft is widely patent.      There is 85% stenosis distal to the graft insertion site.   CONCLUSION:  1. Bypass graft failure with occlusion of the saphenous vein graft to      the diagonal and occlusion of the saphenous vein graft to the ramus      intermedius.  2. Patent man-made Y using the free RIMA from the hood of the      saphenous vein graft to the obtuse marginal.  The saphenous vein      graft to the right coronary contains 50% proximal narrowing.  3. Severe native vessel coronary artery disease with high-grade      occlusion of the first diagonal, 85% obstruction beyond the      insertion of the LIMA to the LAD, 80% segmental  disease in the      ostial to proximal ramus, and total occlusion of the distal right      coronary.  4. Normal LV function.   PLAN:  Intensification of medical therapy.  If angina is refractory  consider PCI on the LAD distal to the LIMA graft insertion site and the  ramus intermedius branch.  The diagonal branch is probably not easily  amendable to PCI because of the small size and tortuosity.  We will  start Plavix. We will follow the patient closely.      Lyn Records, M.D.  Electronically Signed     HWS/MEDQ  D:  07/28/2008  T:  07/28/2008  Job:  [id number]   cc:   Salvatore Decent. Dorris Fetch, M.D.  Thora Lance, M.D.

## 2010-05-18 NOTE — Assessment & Plan Note (Signed)
OFFICE VISIT   [name] [name], [name] [name]  DOB:  24-Apr-1950                                        May 19, 2008  CHART #:  [id number]   HISTORY OF PRESENT ILLNESS:  The patient is status post coronary artery  bypass grafting x5, which was done by Dr. Dorris Fetch in April 25, 2008.  The patient tolerated this procedure well and had a pretty much  uncomplicated postoperative course.  He was discharged to home on April 29, 2008, in stable condition.  The patient presents back to the office  today for his 3-week followup appointment.  The patient saw Dr. Katrinka Blazing PA  last week who felt that the patient was progressing well.  Today, the  patient feels that he is progressing well.  He does complain of some  numbness in his left arm as well as some redness around his left radial  artery harvest site and redness around his middle chest tube site.  Otherwise, he is doing fairly well.  He has pretty much stopped taking  the pain medication except at night as needed.  His pain is minimal.  He  is up ambulating half mile twice a day.  His appetite is slowly coming  back.  He does state that he has lost taste for some of the food he used  to enjoy.  The patient states that cardiac rehab has not contacted him  but that he does not want to do cardiac rehab.  He feels that he is able  to ambulate enough on his own.  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   PHYSICAL EXAMINATION:  VITAL SIGNS:  Blood pressure of 120/73, pulse of  76, respirations of 18, O2 sats 97% on room air.  RESPIRATORY:  Clear to auscultation bilaterally.  CARDIAC:  Regular rate and rhythm.  No murmurs, gallops, or rubs noted.  Sternum stable.  ABDOMEN:  Soft, nontender.  EXTREMITIES:  No edema noted.  Warm to touch.  INCISIONS:  The patient's sternotomy site is clean, dry, and intact and  healing well.  One suture was removed from the midline and one suture  was  removed distally.  The patient's middle chest tube site scabbed over  with mild erythema surrounding.  His right lower extremity incision  sites are clean, dry, and intact.  The patient's left radial artery  harvest site has mild erythema surrounding.  There was one suture  removed from his distal site.  No signs of purulent drainage noted.   STUDY:  The patient had a PA and lateral chest x-ray done today, which  is clear showing improved aeration.  There is a very small amount of  left pleural effusion noted.  All wires intact.   IMPRESSION AND PLAN:  The patient is progressing extremely well.  He was  encouraged to attend cardiac rehab when contacted but told if not he is  to continue ambulating as tolerated and increasing his distance as  tolerated.  He is told it is okay to drive at this standpoint.  The  restrictions are still no heavy lifting over 10 pounds for another  month.  After that time, he is to slowly increase his lifting  requirements based on how he feels.  I placed the  patient on a 10-day  course of Keflex to cover his left radial artery harvest site wound.  I  was planning to see the patient back in a week.  The patient is unable  to come back at that time,  so I scheduled him to follow up with Dr. Donata Clay for Friday the 28th without a chest x-ray.  The patient told in  the interim if he develops any fevers, if his incision site erythema  increases, he develops purulent drainage, shortness of breath, he is to  contact us, and we will see him sooner.   Salvatore Decent Dorris Fetch, M.D.  Electronically Signed   KMD/MEDQ  D:  05/19/2008  T:  05/20/2008  Job:  [id number]   cc:   Lyn Records, M.D.  Thora Lance, M.D.

## 2010-05-21 NOTE — Op Note (Signed)
[name] [name], [name] [name]                ACCOUNT NO.:  [id number]   MEDICAL RECORD NO.:  [id number]          PATIENT TYPE:  AMB   LOCATION:  DSC                          FACILITY:  [facility]   PHYSICIAN:  Harvie Junior, M.D.   DATE OF BIRTH:  06-30-1950   DATE OF PROCEDURE:  07/21/2004  DATE OF DISCHARGE:                                 OPERATIVE REPORT   PREOPERATIVE DIAGNOSIS:  1.  Impingement.  2.  Acromioclavicular joint arthritis.  3.  Suspect rotator cuff tear.   POSTOPERATIVE DIAGNOSIS:  1.  Impingement.  2.  Acromioclavicular joint arthritis.  3.  Suspect rotator cuff tear.  4.  Biceps tendon tear with superior labral tear, anterior to posterior.   OPERATION PERFORMED:  1.  Mini open rotator cuff repair with corresponding acromioplasty.  2.  Arthroscopic distal clavicle resection through an anterior portal.  3.  Open biceps tenodesis.  4.  Debridement of biceps tendon stump and superior labral tear from within      the glenohumeral joint.   SURGEON:  Harvie Junior, M.D.   ASSISTANT:  Marshia Ly, P.A.   ANESTHESIA:  General.   INDICATIONS FOR PROCEDURE:  The patient is a 60 year old male with a long  history of having right shoulder pain. We treated him conservatively for a  long period of time.  Injection therapy seemed to have helped quite a bit  and because of continued complaints of pain, he was ultimately taken to the  operating room for subacromial decompression, distal clavicle resection,  other as needed.   DESCRIPTION OF PROCEDURE:  The patient was brought to the operating room and  after adequate anesthesia was obtained with general anesthetic, the patient  was placed supine on the operating table, moved to beach chair position and  all bony prominences were well padded.  Attention was then turned to the  right shoulder where after routine prep and drape, the patient underwent  evaluation of the glenohumeral joint. There were obvious significant issues  with the superior labrum and somewhat of a slap lesion.  The biceps tendon  was frayed but also pulled down, would pull down into the joint.  At that  point, it was felt that either superior labral repair versus biceps  tenodesis was going to be the most appropriate course of action ultimately,  given the nature of fraying of the biceps tendon, it was felt that biceps  tenodesis would be the more reproducible course of action.  At this point,  an arthroscopic scissors was introduced through the anterior portal and a  clipping of the biceps tendon was undertaken at this point.  The rotator  cuff was evaluated from the inferior surface and there was a small area of  the rotator cuff which seemed to have a full thickness hole. This was just  posterior to the biceps tendon, not a large area.  At this point attention  was turned now to the glenohumeral joint into the subacromial space.  An  aggressive anterolateral acromioplasty was performed.  Distal clavicle  resection over 15 mm and then attention was  then turned towards miniopen  repair.  Incision was made laterally.  Subcutaneous tissue dissected down to  the level of the deltoid.  The deltoid fibers were divided and the rotator  cuff was easily identifiable in this area.  Small full thickness tear was  identified.  During the arthroscopic portion of the examination there was  also a large high grade partial thickness tear that seemed to be a  delamination which was also identified within the cuff.  At this point, the  small rotator cuff tear was explored.  It did extend into the biceps  interval and once we got into the biceps interval, the biceps tendon was in  this area and was grasped and held on to.  At this point a bur was used to  bur down the area of the bone where the rotator cuff tendon tear was and  following this, the biceps groove was burred down.  At this point 5.5 mm  Arthrex suture anchors were used with two sutures attached  to each and the  rotator cuff tendon was repaired down to this area of the head.  Once the  two stitches were placed here and this was placed down in this area,  attention was then turned towards the biceps tendon where a biceps tenodesis  was performed with a 5.5 suture anchor and two stitches through the biceps  tendon. The biceps tendon the second stitch was then also advanced to the  rotator cuff and excellent repair here was achieved.  This was done with the  arm in full extension with the biceps tendon being held taut.  Once this was  repaired, the excess portion of the biceps tendon was cut.  Attention at  this time was turned towards the delaminated area where a #2 Ethibond suture  was used in a single figure-of-eight stitch to close down the delamination.  This was relatively uneventful.  At this point copious irrigation was used  in the subacromial space and the arm was moved through a full range of  motion, no tendency towards impingement at all.  At this point the deltoid  was closed with one Vicryl running suture and the skin with 0 and 2-0  Vicryls and 3-0 Maxon pull out suture.  Benzoin and Steri-Strips were  applied.  The patient was taken to the recovery room where she was noted to  be in satisfactory condition.  The estimated blood loss for this procedure  was none.       JLG/MEDQ  D:  07/21/2004  T:  07/21/2004  Job:  [id number]

## 2010-06-23 ENCOUNTER — Inpatient Hospital Stay (HOSPITAL_COMMUNITY)
Admission: EM | Admit: 2010-06-23 | Discharge: 2010-06-25 | DRG: 853 | Disposition: A | Discharge status: Home / Self Care | Payer: BC Managed Care – PPO | Attending: Interventional Cardiology | Admitting: Interventional Cardiology

## 2010-06-23 DIAGNOSIS — I10 Essential (primary) hypertension: Secondary | ICD-10-CM | POA: Diagnosis present

## 2010-06-23 DIAGNOSIS — I2510 Atherosclerotic heart disease of native coronary artery without angina pectoris: Secondary | ICD-10-CM | POA: Diagnosis present

## 2010-06-23 DIAGNOSIS — R079 Chest pain, unspecified: Secondary | ICD-10-CM

## 2010-06-23 DIAGNOSIS — E119 Type 2 diabetes mellitus without complications: Secondary | ICD-10-CM | POA: Diagnosis present

## 2010-06-23 DIAGNOSIS — Z87891 Personal history of nicotine dependence: Secondary | ICD-10-CM

## 2010-06-23 DIAGNOSIS — I25810 Atherosclerosis of coronary artery bypass graft(s) without angina pectoris: Secondary | ICD-10-CM | POA: Diagnosis present

## 2010-06-23 DIAGNOSIS — E785 Hyperlipidemia, unspecified: Secondary | ICD-10-CM | POA: Diagnosis present

## 2010-06-23 DIAGNOSIS — I214 Non-ST elevation (NSTEMI) myocardial infarction: Principal | ICD-10-CM | POA: Diagnosis present

## 2010-06-24 ENCOUNTER — Emergency Department (HOSPITAL_COMMUNITY): Payer: BC Managed Care – PPO

## 2010-06-24 LAB — CBC
Hemoglobin: 14.1 g/dL (ref 13.0–17.0)
MCV: 84.4 fL (ref 78.0–100.0)
Platelets: 163 10*3/uL (ref 150–400)
RBC: 4.73 MIL/uL (ref 4.22–5.81)
WBC: 8 10*3/uL (ref 4.0–10.5)

## 2010-06-24 LAB — GLUCOSE, CAPILLARY: Glucose-Capillary: 91 mg/dL (ref 70–99)

## 2010-06-24 LAB — CARDIAC PANEL(CRET KIN+CKTOT+MB+TROPI)
CK, MB: 9.6 ng/mL (ref 0.3–4.0)
CK, MB: 7.3 ng/mL (ref 0.3–4.0)
Relative Index: 5 — ABNORMAL HIGH (ref 0.0–2.5)
Relative Index: 5.1 — ABNORMAL HIGH (ref 0.0–2.5)
Total CK: 141 U/L (ref 7–232)
Troponin I: 2.45 ng/mL (ref <0.30)
Troponin I: 3 ng/mL (ref <0.30)
Troponin I: 1.81 ng/mL (ref <0.30)

## 2010-06-24 LAB — MRSA PCR SCREENING: MRSA by PCR: NEGATIVE

## 2010-06-24 LAB — CK TOTAL AND CKMB (NOT AT ARMC)
Relative Index: 3 — ABNORMAL HIGH (ref 0.0–2.5)
Total CK: 147 U/L (ref 7–232)

## 2010-06-24 LAB — LIPID PANEL
Cholesterol: 130 mg/dL (ref 0–200)
LDL Cholesterol: 68 mg/dL (ref 0–99)
Triglycerides: 92 mg/dL (ref <150)
VLDL: 18 mg/dL (ref 0–40)

## 2010-06-24 LAB — DIFFERENTIAL
Basophils Relative: 0 % (ref 0–1)
Eosinophils Absolute: 0.1 10*3/uL (ref 0.0–0.7)
Lymphs Abs: 1.3 10*3/uL (ref 0.7–4.0)
Neutro Abs: 5.8 10*3/uL (ref 1.7–7.7)
Neutrophils Relative %: 73 % (ref 43–77)

## 2010-06-24 LAB — PROTIME-INR: INR: 1.06 (ref 0.00–1.49)

## 2010-06-24 LAB — POCT I-STAT, CHEM 8
Calcium, Ion: 1.19 mmol/L (ref 1.12–1.32)
Chloride: 98 mEq/L (ref 96–112)
HCT: 42 % (ref 39.0–52.0)
Sodium: 129 mEq/L — ABNORMAL LOW (ref 135–145)

## 2010-06-24 LAB — TROPONIN I
Troponin I: 0.54 ng/mL (ref <0.30)
Troponin I: 1.67 ng/mL (ref <0.30)

## 2010-06-24 LAB — HEPARIN LEVEL (UNFRACTIONATED): Heparin Unfractionated: 0.44 IU/mL (ref 0.30–0.70)

## 2010-06-25 LAB — GLUCOSE, CAPILLARY
Glucose-Capillary: 157 mg/dL — ABNORMAL HIGH (ref 70–99)
Glucose-Capillary: 202 mg/dL — ABNORMAL HIGH (ref 70–99)

## 2010-06-25 LAB — BASIC METABOLIC PANEL
Calcium: 8.9 mg/dL (ref 8.4–10.5)
Creatinine, Ser: 0.97 mg/dL (ref 0.50–1.35)
GFR calc Af Amer: >60 mL/min (ref >60)
GFR calc non Af Amer: >60 mL/min (ref >60)
Sodium: 131 mEq/L — ABNORMAL LOW (ref 135–145)

## 2010-06-25 LAB — CBC
MCH: 30.1 pg (ref 26.0–34.0)
MCHC: 34.8 g/dL (ref 30.0–36.0)
MCV: 86.5 fL (ref 78.0–100.0)
Platelets: 151 10*3/uL (ref 150–400)
RBC: 4.38 MIL/uL (ref 4.22–5.81)
RDW: 12.8 % (ref 11.5–15.5)

## 2010-06-29 NOTE — Cardiovascular Report (Signed)
[name], [name] NO.:  [id number]  MEDICAL RECORD NO.:  [id number]  LOCATION:  2916                         FACILITY:  [facility]  PHYSICIAN:  Jake Bathe, MD      DATE OF BIRTH:  January 24, 1950  DATE OF PROCEDURE:  06/24/2010 DATE OF DISCHARGE:                           CARDIAC CATHETERIZATION   CARDIOLOGIST:  Lyn Records, MD  REASON FOR CATHETERIZATION:  Non-ST-elevation myocardial infarction, prior bypass surgery, and chest pain.  PROCEDURE DETAILS:  Informed consent was obtained.  Risk of stroke, heart attack, death, renal impairment, arterial damage, bleeding were explained to the patient at length.  He was prepped and draped in a sterile fashion, placed on catheterization table.  Femoral head was visualized via fluoroscopy.  Modified Seldinger technique was used to cannulate the right heart femoral artery with 6-French sheath. Diagnostic catheters utilized were JL-4 Judkins right 4 and a LIMA catheter.  An angled pigtail was also used to cross the left ventricle. Multiple views with hand injection of Omnipaque were obtained of the native coronary vasculature as well as the LIMA bypass graft and the SVG bypass grafts.  After the procedure was done, diagnostic catheters were removed and Dr. Eldridge Dace performed percutaneous intervention to the first diagonal branch utilizing a Promus DES stent.  He tolerated the procedure well.  FINDINGS: 1. Left main artery - widely patent, gives rise to both the LAD as     well as circumflex branch.  No significant luminal irregularities     noted. 2. LAD - this vessel demonstrates a calcified 60% lesion at the     bifurcation of the first diagonal branch, then continues down to     the anastomosis site of the LIMA graft where there is competitive     flow.  Distal to the LIMA graft, there is a luminal irregularity of     approximately 40%, but when compared to prior catheterization film,     this does not appear to  be 80% as was previously described.  The     first diagonal branch does comprise a long 90% stenosis in a     moderate-sized vessel.  This was the vessel that was by     percutaneous intervention was performed on #3 obtuse marginal - the     obtuse marginal/ramus intermediate branch comprises approximately a     30-50% lesion proximally.  This lesion does not appear to be as     significant as the previously demonstrated 80%.  The ramus was     intramyocardial according to Dr. Sunday Corn note.  The obtuse     marginal is occluded proximally. 3. Right coronary artery - there is calcification at the ostium.  This     vessel is occluded in the distal segment.  Two acute marginal     branches are noted.  The SVG to diagonal graft is occluded at the     aortic junction.  This was seen on prior catheterization in 2010. 4. The SVG to obtuse marginal graft is widely patent and just distal     to this anastomosis and a fairly large obtuse marginal branch,  there is an 80-90% stenosis.  In the vessel that is anastomosed to     the bypass graft ostially, there is a 80% to 90% stenosis.  There     are two other obtuse marginal branches distally that are supplied     by this bypass graft through retrograde flow.  Watching the film in     its late segments, the distal segment of this anastomosis obtuse     marginal branch seems to give rise to a collateral blood flow     perhaps to this diagonal vessel. 5. SVG to PDA, this graft is widely patent.  The PDA distal to this is     also widely patent.  No luminal irregularities. 6. LIMA to LAD also widely patent.  Distal to the LIMA insertion site,     there is a 30-40% lesion.  This lesion does not appear to be as     significant as the previously described 80%.  Graft #5 is occluded     and this is the Y graft that was a free radial, which tied into     another acute obtuse marginal branch.  The button or nub of this     free radial graft is noted  in the SVG to obtuse marginal graft in     the proximal segment.  This has been occluded since the prior     catheterization.  In summary, the free radial graft to obtuse marginal and the SVG to diagonal grafts are occluded as was seen on prior catheterization.  LEFT VENTRICULOGRAM:  Normal left ventricular ejection fraction of 60% with no wall motion abnormalities detected.  No mitral regurgitation. There is no significant aortic stenosis detected.  Ejection fraction of 60%.  Left ventricular systolic pressure 119 with an end-diastolic pressure of 18 mmHg.  Aortic pressure 119/69 with a mean of 90 mmHg.  IMPRESSION: 1. Severe native three-vessel coronary artery disease. 2. Widely patent left internal mammary artery graft to left anterior     descending, saphenous vein graft to posterior descending artery and     saphenous vein graft to obtuse marginal grafts.  Both the free     radial graft to obtuse marginal and the saphenous vein graft to     diagonal are occluded as was seen on prior catheterization. 3. Diseased distal to the left anterior descending anastomosis site of     left internal mammary artery graft appears to be more moderate in     the 30-50% range, then previously seen on prior catheterization. 4. 80-90% stenosis in the obtuse marginal branch distal to the     anastomosis site of the saphenous vein graft.  This vessel seems to     give rise to some collateralization, perhaps to the diagonal     branch. 5. 90% diagonal lesion long.  This is the vessel that percutaneous     coronary intervention was performed on by Dr. Everette Rank. 6. The high obtuse marginal branch that was previously described as     80%, appears to be in the 30-50% range currently. 7. Normal left ventricular ejection fraction.  PLAN:  Findings have been discussed with the patient and Dr. Eldridge Dace. Planned percutaneous intervention to the first diagonal branch was performed with a Promus DES 2.25 x  28 mm stent.  Please see Dr. Hoyle Barr report for full details.  He will continue with his Plavix, which needs to be continued for at least 1 year.  If  he continues to have anginal symptoms, one could consider percutaneous intervention just distal to the SVG obtuse marginal site and the 80% stenotic territory in that region.     Jake Bathe, MD     MCS/MEDQ  D:  06/24/2010  T:  06/25/2010  Job:  [id number]  cc:   Lyn Records, M.D.  Electronically Signed by Donato Schultz MD on 06/29/2010 06:38:39 AM

## 2010-07-12 NOTE — Cardiovascular Report (Signed)
  NAMECHISUM, HABENICHT NO.:  [id number]  MEDICAL RECORD NO.:  [id number]  LOCATION:  2916                         FACILITY:  [facility]  PHYSICIAN:  Corky Crafts, MDDATE OF BIRTH:  10-Dec-1950  DATE OF PROCEDURE:  06/24/2010 DATE OF DISCHARGE:                           CARDIAC CATHETERIZATION   PRIMARY CARDIOLOGIST:  Lyn Records, MD  PROCEDURES PERFORMED:  Percutaneous coronary intervention of the first diagonal.  OPERATOR:  Corky Crafts, MD  INDICATIONS:  Non-ST-elevation MI.  PROCEDURE NARRATIVE:  The diagnostic catheterization was done by Dr. Anne Fu.  This revealed a 95% proximal to mid first diagonal stenosis. The bypass graft to this territory was occluded.  Angiomax was used for anticoagulation.  We elected to perform angioplasty with stent placement.  A CLS 3.5 guiding catheter was used to engage the left main. A Prowater wire was used to cross the stenosis.  Area was predilated with 2.5 x 15 Emerge balloon.  Subsequently a 2.25 x 28 Promus Element stent was used to stent the diseased area.  This stent was postdilated with a 2.5 x 15 [place] Trek balloon.  Several doses of intracoronary nitroglycerin were administered to relieve vasospasm in the distal portion of the vessel likely related to the tight stenosis as well as wire.  There is no residual stenosis at the end of the procedure.  TIMI III flow was maintained throughout.  The patient tolerated the procedure well.  IMPRESSION:  Successful percutaneous coronary intervention of the first diagonal with a drug-eluting stent 2.25 x 28 Promus Element stent postdilated to greater than 2.5 mm in diameter.  RECOMMENDATIONS:  Continue aggressive medical therapy.  He will need to be on dual antiplatelet therapy for at least a year if not longer.  He will be watched overnight and hopefully discharged home tomorrow assuming no complications.     Corky Crafts, MD     JSV/MEDQ  D:   06/24/2010  T:  06/25/2010  Job:  [id number]  Electronically Signed by Lance Muss MD on 07/12/2010 01:27:52 PM

## 2010-07-12 NOTE — Discharge Summary (Signed)
  [name] [name], HUPP NO.:  [id number]  MEDICAL RECORD NO.:  [id number]  LOCATION:  2916                         FACILITY:  [facility]  PHYSICIAN:  Corky Crafts, MDDATE OF BIRTH:  1950-09-11  DATE OF ADMISSION:  06/23/2010 DATE OF DISCHARGE:  06/25/2010                              DISCHARGE SUMMARY   PRIMARY CARDIOLOGIST:  Lyn Records, MD  FINAL DIAGNOSES: 1. Non-ST elevation myocardial infarction. 2. Coronary artery disease. 3. Hypertension. 4. Hyperlipidemia. 5. Diabetes.  PROCEDURE PERFORMED:  Cardiac catheterization with drug-eluting stent placement to the first diagonal on June 24, 2010.  HOSPITAL COURSE:  The patient was admitted after having chest discomfort and having abnormal cardiac enzymes.  He underwent cardiac catheterization the following day.  It was somewhat difficult to tell what the culprit vessel was.  He did have compromised flow to the distal lateral wall territory because his native diagonal had a severe stenosis and the bypass graft to this vessel was occluded.  There were collaterals noted from the circumflex system, however, these were also compromised by a stenosis.  Therefore, the diagonal was intervened upon as noted above.  He tolerated the procedure well.  He had no further angina.  He walked in the hallways the day after the procedure with no problems.  His EKG was normal the following day.  His heart rate was on the slow side.  He had heart rates in the high 50s but would go up to the 70s with walking.  His Cardizem was held on June 25, 2010.  We spoke about this.  He mentioned that sometimes he notes a slow heart rate when he feels short of breath.  He has not had any syncope.  He will continue his home medications and we will discuss this further with Dr. Katrinka Blazing.  DISCHARGE MEDICATIONS: 1. Aspirin 325 mg daily. 2. Xanax 0.25 mg at bedtime p.r.n. 3. Cardizem 420 mg p.o. daily. 4. Crestor 20 mg p.o. daily. 5.  Flonase. 6. Glimepiride 4 mg p.o. daily. 7. Isosorbide 60 mg daily. 8. Metformin 1 gram b.i.d., restart on June 26, 2010. 9. Metoprolol XL 50 mg daily. 10.Nitroglycerin sublingual tablets p.r.n. chest pain. 11.Plavix 75 mg daily. 12.Quinapril/hydrochlorothiazide 20/12.5 mg daily.  ACTIVITY:  Increase activity slowly.  No lifting more than 10 pounds for a week.  DIET:  Low-sodium, heart-healthy diet.  FOLLOWUP APPOINTMENTS:  With Dr. Katrinka Blazing as previously scheduled in July. Of note, at that appointment they should discuss the bradycardia that he describes during episodes of shortness of breath.     Corky Crafts, MD     JSV/MEDQ  D:  06/25/2010  T:  06/26/2010  Job:  [id number]  Electronically Signed by Lance Muss MD on 07/12/2010 01:27:44 PM

## 2010-08-04 ENCOUNTER — Inpatient Hospital Stay (HOSPITAL_COMMUNITY)
Admission: EM | Admit: 2010-08-04 | Discharge: 2010-08-06 | DRG: 808 | Disposition: A | Discharge status: Home / Self Care | Payer: BC Managed Care – PPO | Attending: Interventional Cardiology | Admitting: Interventional Cardiology

## 2010-08-04 ENCOUNTER — Emergency Department (HOSPITAL_COMMUNITY): Payer: BC Managed Care – PPO

## 2010-08-04 DIAGNOSIS — R0602 Shortness of breath: Secondary | ICD-10-CM

## 2010-08-04 DIAGNOSIS — Z7902 Long term (current) use of antithrombotics/antiplatelets: Secondary | ICD-10-CM

## 2010-08-04 DIAGNOSIS — F411 Generalized anxiety disorder: Secondary | ICD-10-CM | POA: Diagnosis present

## 2010-08-04 DIAGNOSIS — I214 Non-ST elevation (NSTEMI) myocardial infarction: Principal | ICD-10-CM | POA: Diagnosis present

## 2010-08-04 DIAGNOSIS — Z7982 Long term (current) use of aspirin: Secondary | ICD-10-CM

## 2010-08-04 DIAGNOSIS — Z951 Presence of aortocoronary bypass graft: Secondary | ICD-10-CM

## 2010-08-04 DIAGNOSIS — Z888 Allergy status to other drugs, medicaments and biological substances status: Secondary | ICD-10-CM

## 2010-08-04 DIAGNOSIS — E119 Type 2 diabetes mellitus without complications: Secondary | ICD-10-CM | POA: Diagnosis present

## 2010-08-04 DIAGNOSIS — I2510 Atherosclerotic heart disease of native coronary artery without angina pectoris: Secondary | ICD-10-CM | POA: Diagnosis present

## 2010-08-04 DIAGNOSIS — E785 Hyperlipidemia, unspecified: Secondary | ICD-10-CM | POA: Diagnosis present

## 2010-08-04 LAB — POCT ACTIVATED CLOTTING TIME
Activated Clotting Time: 303 seconds
Activated Clotting Time: 0 seconds

## 2010-08-04 LAB — TROPONIN I: Troponin I: 0.33 ng/mL (ref <0.30)

## 2010-08-04 LAB — DIFFERENTIAL
Eosinophils Absolute: 0.2 10*3/uL (ref 0.0–0.7)
Eosinophils Relative: 2 % (ref 0–5)
Lymphocytes Relative: 23 % (ref 12–46)
Lymphs Abs: 1.8 10*3/uL (ref 0.7–4.0)
Monocytes Absolute: 1 10*3/uL (ref 0.1–1.0)

## 2010-08-04 LAB — PRO B NATRIURETIC PEPTIDE: Pro B Natriuretic peptide (BNP): 215.7 pg/mL — ABNORMAL HIGH (ref 0–125)

## 2010-08-04 LAB — COMPREHENSIVE METABOLIC PANEL
ALT: 23 U/L (ref 0–53)
AST: 29 U/L (ref 0–37)
Alkaline Phosphatase: 71 U/L (ref 39–117)
CO2: 25 mEq/L (ref 19–32)
Calcium: 9.4 mg/dL (ref 8.4–10.5)
Chloride: 101 mEq/L (ref 96–112)
GFR calc non Af Amer: >60 mL/min (ref >60)
Potassium: 3.9 mEq/L (ref 3.5–5.1)
Sodium: 135 mEq/L (ref 135–145)

## 2010-08-04 LAB — GLUCOSE, CAPILLARY
Glucose-Capillary: 138 mg/dL — ABNORMAL HIGH (ref 70–99)
Glucose-Capillary: 132 mg/dL — ABNORMAL HIGH (ref 70–99)

## 2010-08-04 LAB — POCT I-STAT, CHEM 8
Calcium, Ion: 1.2 mmol/L (ref 1.12–1.32)
Chloride: 99 mEq/L (ref 96–112)
Glucose, Bld: 257 mg/dL — ABNORMAL HIGH (ref 70–99)
HCT: 43 % (ref 39.0–52.0)
Hemoglobin: 14.6 g/dL (ref 13.0–17.0)
TCO2: 23 mmol/L (ref 0–100)

## 2010-08-04 LAB — CBC
HCT: 40.4 % (ref 39.0–52.0)
MCH: 30.3 pg (ref 26.0–34.0)
MCHC: 34.9 g/dL (ref 30.0–36.0)
MCV: 86.9 fL (ref 78.0–100.0)
Platelets: 162 10*3/uL (ref 150–400)
RDW: 12.4 % (ref 11.5–15.5)

## 2010-08-04 LAB — CK TOTAL AND CKMB (NOT AT ARMC)
CK, MB: 4.2 ng/mL — ABNORMAL HIGH (ref 0.3–4.0)
Relative Index: 1.2 (ref 0.0–2.5)
Total CK: 347 U/L — ABNORMAL HIGH (ref 7–232)

## 2010-08-04 LAB — CARDIAC PANEL(CRET KIN+CKTOT+MB+TROPI)
Relative Index: 3.3 — ABNORMAL HIGH (ref 0.0–2.5)
Relative Index: 4.8 — ABNORMAL HIGH (ref 0.0–2.5)
Total CK: 346 U/L — ABNORMAL HIGH (ref 7–232)
Troponin I: 2.89 ng/mL (ref <0.30)

## 2010-08-04 LAB — PROTIME-INR: Prothrombin Time: 13.9 seconds (ref 11.6–15.2)

## 2010-08-05 LAB — GLUCOSE, CAPILLARY
Glucose-Capillary: 116 mg/dL — ABNORMAL HIGH (ref 70–99)
Glucose-Capillary: 106 mg/dL — ABNORMAL HIGH (ref 70–99)

## 2010-08-05 LAB — CARDIAC PANEL(CRET KIN+CKTOT+MB+TROPI): Total CK: 215 U/L (ref 7–232)

## 2010-08-05 LAB — BASIC METABOLIC PANEL
BUN: 12 mg/dL (ref 6–23)
GFR calc Af Amer: >60 mL/min (ref >60)
GFR calc non Af Amer: >60 mL/min (ref >60)
Potassium: 3.6 mEq/L (ref 3.5–5.1)
Sodium: 132 mEq/L — ABNORMAL LOW (ref 135–145)

## 2010-08-05 LAB — POCT ACTIVATED CLOTTING TIME: Activated Clotting Time: 144 seconds

## 2010-08-05 LAB — CBC
MCH: 30.2 pg (ref 26.0–34.0)
MCHC: 34.8 g/dL (ref 30.0–36.0)
Platelets: 155 10*3/uL (ref 150–400)

## 2010-08-06 LAB — GLUCOSE, CAPILLARY: Glucose-Capillary: 144 mg/dL — ABNORMAL HIGH (ref 70–99)

## 2010-08-06 LAB — CARDIAC PANEL(CRET KIN+CKTOT+MB+TROPI): Total CK: 159 U/L (ref 7–232)

## 2010-08-06 LAB — CBC
Hemoglobin: 13.7 g/dL (ref 13.0–17.0)
MCHC: 35.3 g/dL (ref 30.0–36.0)
RDW: 12.3 % (ref 11.5–15.5)

## 2010-08-25 NOTE — H&P (Signed)
NAMEMURRIEL, EIDEM NO.:  [id number]  MEDICAL RECORD NO.:  [id number]  LOCATION:  [facility]                         FACILITY:  [facility]  PHYSICIAN:  Harlon Flor, MD   DATE OF BIRTH:  1950/01/06  DATE OF ADMISSION:  08/04/2010 DATE OF DISCHARGE:                             HISTORY & PHYSICAL   Admission is for outpatient observation.  PRIMARY CARDIOLOGIST:  Lyn Records, MD  PRIMARY CARE PHYSICIAN:  Thora Lance, MD  CHIEF COMPLAINT:  Shortness of breath.  HISTORY OF PRESENT ILLNESS:  Mr. Geiman is a 60 year old white male with coronary artery disease and bypass surgery in 2010 who recently underwent an intervention to his native diagonal 1 month ago.  He presents to the emergency room tonight after an episode of shortness of breath while lying in bed.  He said he had the sensation that his heart rate had slowed down.  This was followed by approximately 15 minutes of shortness of breath, which spontaneously resolved.  He did not have any chest pain.  He thought he may have had a chill; however, he has not had a fever.  He was having similar symptoms prior to his PCI; however, he was also having chest pain at that time.  Since his PCI in late June, he has not had any chest pain at rest or with exertion.  He has a history of what he reports as intermittent bradycardia and intermittent tachycardia.  He has had this evaluated this with Holter monitors in the past that have been unremarkable per his report.  He is currently asymptomatic and not having any pain.  Because of some similarity between these symptoms and ones he had had previously, he wanted to come and get this checked out.  Overall since his most recent intervention, he has been doing fairly well.  He has not had any changes to his medications.  PAST MEDICAL HISTORY: 1. Coronary artery disease:  In 2010, he underwent bypass surgery x5     grafts with a LIMA to LAD, vein graft to diagonal,  vein graft to     ramus, vein graft to right PDA and a left radial to an obtuse     marginal.  By his most recent cath in June 2012, the vein graft to     diagonal as well as a left radial to the OM were occluded.  He     underwent intervention with a 2.25 x 28 Promus Element stent to his     native first diagonal.  Of note, he also had some disease distal to     the graft to the obtuse marginal or ramus that was interpreted as     80% that might be a target for further intervention if needed at a     later date. 2. Diabetes mellitus type 2. 3. Hypertension. 4. Hyperlipidemia. 5. Anxiety.  HOME MEDICATIONS: 1. Xanax 0.25 mg at bedtime as needed. 2. Hydrocodone/Tylenol 5/325 every 6 hours as needed. 3. Quinapril/HCTZ 20/12.5 daily. 4. Plavix 75 mg daily. 5. Nitroglycerin sublingual p.r.n. 6. Toprol-XL 50 mg at bedtime. 7. Metformin 1000 mg b.i.d. 8. Imdur 60 mg daily. 9.  Amaryl 4 mg half a tablet daily. 10.Flonase 1 spray each day. 11.Crestor 20 mg daily. 12.Cardizem 420 mg daily. 13.Aspirin 325 daily.  ALLERGIES:  CODEINE and AVANDIA.  SOCIAL HISTORY:  The patient smoked cigarettes 20 years ago, but quit. He rarely drinks alcohol.  He works as a Teaching laboratory technician at [employer].  FAMILY HISTORY:  There is no history of early coronary artery disease.  REVIEW OF SYSTEMS:  A full review of systems is obtained and is negative except what is stated in the HPI.  PHYSICAL EXAMINATION:  VITAL SIGNS:  Blood pressure 147/71, pulse 70, respirations 16, temperature 98.5. GENERAL:  No acute distress. HEENT: Extraocular movements intact.  Oropharynx benign.  Nonicteric sclera. NECK:  Supple. CARDIOVASCULAR:  Regular rate and rhythm without murmurs, rubs, or gallops. LUNGS:  Clear to auscultation bilaterally. ABDOMEN:  Soft, nontender, nondistended. EXTREMITIES:  No clubbing, cyanosis or edema. NEURO:  Grossly afocal.  Alert and oriented.  Moves all  extremities well. SKIN:  No rashes. LYMPH:  No lymphadenopathy.  EKG shows normal sinus rhythm without ST-T changes.  His chest x-ray is clear.  White count of 7.8, hemoglobin 14, platelets 162.  BUN 15, creatinine 1.10.  His cardiac enzymes were negative.  His D-dimer is negative.  ASSESSMENT/PLAN:  Mr. Purk is a 60 year old white male with coronary artery disease and bypass surgery in 2010 with 3/5 grafts patent by his most recent left heart cath in June 2012, who underwent percutaneous coronary intervention with a 2.25 x 28 Promus drug-eluting stent to his native first diagonal on June 22.  He returns with nonspecific symptoms. He relates nonspecific shortness of breath symptoms to bradycardia. Since his percutaneous coronary intervention, it does not appear that he has had any angina. 1. Coronary artery disease:  It is unclear to me if his symptoms represent     angina.  He reports intermittent shortness of breath that he is relating to     bradycardia.  Currently he is pain free.  We will admit him to a      monitored bed and rule him out for an myocardial infarction with     serial biomarkers.  If he rules out, he can likely go home later     this afternoon. 2. Possible bradycardia:  The patient reports intermittent bradycardia     as well as some tachycardia with heart rates just at 100.  I will     continue Toprol-XL 50 mg daily, but decrease his diltiazem to 240     mg daily and monitor him on telemetry.  If he has no events, we     might consider a 30-day event monitor at discharge. 3. Hypertension:  Fairly well controlled now, however, with a decrease     in his diltiazem dose, may need to     consider increasing Imdur or increasing his quinapril/HCTZ as     needed.    Harlon Flor, MD     MMB/MEDQ  D:  08/04/2010  T:  08/04/2010  Job:  [id number]  cc:   Lyn Records, M.D. Thora Lance, M.D.  Electronically Signed by Meridee Score MD on 08/25/2010  08:16:45 PM

## 2010-09-03 NOTE — Discharge Summary (Signed)
  [name] [name], [name] NO.:  [id number]  MEDICAL RECORD NO.:  [id number]  LOCATION:  2926                         FACILITY:  [facility]  PHYSICIAN:  [name] [name], M.D.   DATE OF BIRTH:  Dec 25, 1950  DATE OF ADMISSION:  08/04/2010 DATE OF DISCHARGE:  08/06/2010                              DISCHARGE SUMMARY   REASON FOR ADMISSION:  Non-ST-elevation myocardial infarction.  DISCHARGE DIAGNOSES: 1. Non-ST-elevation myocardial infarction on August 04, 2010.     a.     Treated with angioplasty of the free radial artery graft to      the obtuse marginal #1. 2. Coronary atherosclerotic heart disease.     a.     Status post coronary artery bypass surgery in 2010.     b.     Non-ST-elevation myocardial infarction in June 2012 with      drug-eluting stent to diagonal #1. 3. Diabetes mellitus. 4. Hyperlipidemia. 5. Anxiety disorder.  PROCEDURE PERFORMED:  Coronary angiography and percutaneous coronary intervention on August 04, 2010 by Dr. Verdis [name].  DISCHARGE PLANNING:  Medication regimen to include an increase in isosorbide XR to 120 mg per day and other medications will be unchanged from admitting including: 1. Alprazolam 0.25 mg at bedtime as needed. 2. Aspirin 325 mg daily. 3. Diltiazem LA 420 mg daily. 4. Crestor 20 mg daily. 5. Flonase one spray each nostril daily. 6. Glimepiride 4 mg one half tablet daily. 7. Hydrocodone/APAP 5/325 mg one tablet every 6 hours. 8. Metformin 1000 mg twice a day. 9. Metoprolol XL 50 mg at bedtime. 10.Nitroglycerin 0.4 mg sublingually p.r.n. 11.Plavix 75 mg per day. 12.Quinapril HCT 20/12.5 mg daily.  ACTIVITY:  As instructed and tolerated per cardiac rehab.  The patient should not return to work until after September 04, 2010.  The patient will have an office visit to see Dr. Verdis [name] on August 18, 2010.  He is to call if any recurrent chest pain.  HISTORY AND PHYSICAL AND HOSPITAL COURSE:  Please see the  patient's admitting history and physical.  Please see the patient's procedure note of August 04, 2010 by Dr. Verdis [name].  The patient's hospital course after PCI was unremarkable.  The patient had elevated cardiac markers on admission to the hospital that gradually diminished during his 3-day stay.  He had no recurrent angina after the percutaneous procedure.  At the time of discharge, the patient is stable.  He is deemed to be at increased risk for ischemic events.  We have discussed this in full.  He will return if any recurrent angina.  Exiting laboratory data includes a hemoglobin of 13.7, creatinine of 0.76, potassium 3.6.     [name] [name], M.D.     HWS/MEDQ  D:  08/06/2010  T:  08/06/2010  Job:  [id number]  Electronically Signed by [name] [name] M.D. on 09/03/2010 04:20:21 PM

## 2010-09-03 NOTE — Cardiovascular Report (Signed)
NAMELARRELL, RAPOZO NO.:  [id number]  MEDICAL RECORD NO.:  [id number]  LOCATION:  2926                         FACILITY:  [facility]  PHYSICIAN:  Lyn Records, M.D.   DATE OF BIRTH:  18-Oct-1950  DATE OF PROCEDURE:  08/04/2010 DATE OF DISCHARGE:                           CARDIAC CATHETERIZATION   INDICATION FOR THE STUDY:  Non-ST-elevation myocardial infarction.  PROCEDURES PERFORMED: 1. Left heart catheterization. 2. Selective coronary angiography. 3. Left ventriculography. 4. Saphenous vein graft angiography. 5. Internal mammary graft angiography. 6. Angioplasty of the obtuse marginal #2 via the free radial graft to     the obtuse marginal #2:  The lesion was undilatable at high     pressure and it is suspected that perhaps the lesion was due to a     suture.  DESCRIPTION:  The patient was brought to the cath lab under urgent circumstances because of continued low grade interscapular and left scapular discomfort and mild dyspnea.  This started last evening and after cycling enzymes, elevation in CPK and troponin was noted.  The patient was then reevaluated.  No EKG changes were present.  He was having waxing and waning episodes of discomfort.  It was felt that recatheterization was indicated, especially in light of the recently placed diagonal drug-eluting stent.  A 5-French sheath was inserted without difficulty.  Versed and fentanyl were used for sedation and analgesia.  Diagnostic cath was performed with 5-French A2 multipurpose catheter and we also used an internal mammary artery catheter.  After visualizing the coronaries, the native diagonal stent looks better than at the completion of the case in June.  Only significant and treatable lesion was a high-grade stenosis distal to the free radial graft insertion site in the second obtuse marginal which is felt to be greater than 90%. This appeared slightly worse than that noted at the time  of catheterization 6 weeks ago.  After some discussion, we decided to proceed with PCI of this lesion. We used Angiomax bolus and 300 mg of Plavix orally.  We then performed angioplasty using initially a 6-French #1 left Amplatz.  This gave poor guide support and ultimately we changed to a 2-cm 6-French left Amplatz guide.  We used an Office manager and did angioplasty on this lesion at the junction between the graft insertion and the obtuse marginal.  Using a 2.25 and 2.5 mm balloons at pressures up to 22 atmospheres, we were unable to fully dilate the stenosis with a napkin ring obstruction being noted at high pressure.  We were unable to pass the cutting balloon in this area.  We have no access with rotational atherectomy.  We ultimately removed the equipment, kind of watched the vessel in the lab for 15 minutes.  No symptoms occurred.  The patient's Angiomax infusion was decreased to 0.25 mg/hour and Integrilin infusion will be started.  At that time, the Angiomax will be discontinued. Sheath removal will be by manual compression. RESULTS: 1. Hemodynamic data:     a.     Aortic pressure 117/64.     b.     Left ventricular pressure 114/12 mmHg. 2. Left ventriculography:  LV cavity size  and function are normal. 3. Left main coronary:     a.     Distal left main disease of 40-50% is noted.     b.     Left anterior descending coronary artery:  Heavily calcified      with competitive flow in the midvessel due to patent LIMA.  A      large diagonal that was recently stented by Dr. Eldridge Dace is      patent.  There is a mid stent 60-70% narrowing and a significant      region of tortuosity/kinking at the distal stent margin.  TIMI      flow however is grade 3.  There is a proximal edge dissection      noted.     c.     Ramus intermedius branch:  The ramus is patent and contains      proximal 30-50% narrowing and haziness.     d.     Circumflex artery:  Totally occluded proximally.      e.     Right coronary artery:  The right coronary artery is totally      occluded in the distal vessel.  The conus branch supplies      collaterals and the right atrial recurrent branch supplies      collaterals to the distal circumflex. 4. Bypass graft angiography.     a.     Saphenous vein graft to the right coronary/PDA:  Patent with      proximal 50% narrowing.     b.     Free radial graft to the distal circumflex/second obtuse      marginal widely patent but with a distal anastomoses/obtuse      marginal #2 greater than 90% stenosis.  The man-made Y saphenous      vein graft to the ramus intermedius is occluded.     c.     Saphenous vein graft to the first diagonal:  Totally      occluded. 5. LIMA to the LAD:  Widely patent. 6. PTCA of the obtuse marginal #2 via the free radial graft     technically successful but clinically failed due to inability to     dilate the lesion at high pressure greater than 20 atmospheres.  We     therefore did not stent the lesion because of our inability to     expand the artery appropriately.  CONCLUSIONS: 1. Recurrent unstable angina/non-ST-elevation MI due to marginal     disease beyond the graft insertion site and possibly some ischemia     caused by the diagonal. 2. Bypass graft occlusion with 100% stenosis of the saphenous vein     graft to the ramus and 100% of the graft to the diagonal. 3. Patent recently tented diagonal #1 but with 2 regions of in-stent     narrowing of up to 60-70%. 4. Normal LV function. 5. Severe native vessel coronary artery disease with occlusion of the     distal right coronary artery, the proximal circumflex, and high-     grade obstruction of the distal circumflex/obtuse marginal. 6. The patient will be at high risk for occlusion of this graft at     segment due to inability to dilate.  PLAN:  ICU.  Integrilin.  Medical management of this coronary artery syndrome.     Lyn Records,  M.D.     HWS/MEDQ  D:  08/04/2010  T:  08/04/2010  Job:  [id number]  cc:   Thora Lance, M.D.  Electronically Signed by Verdis Prime M.D. on 09/03/2010 04:20:24 PM

## 2010-09-26 NOTE — H&P (Signed)
[name] [name], [name] [name] NO.:  [id number]  MEDICAL RECORD NO.:  [id number]  LOCATION:  [facility]                         FACILITY:  [facility]  PHYSICIAN:  Henderson Cloud, MD     DATE OF BIRTH:  12/11/1950  DATE OF ADMISSION:  06/23/2010 DATE OF DISCHARGE:                             HISTORY & PHYSICAL   ATTENDING PHYSICIAN:  Dr. Katrinka Blazing  CHIEF COMPLAINT:  Chest pain.  HISTORY OF PRESENT ILLNESS:  The patient is a 60 year old white male with past medical history significant for coronary artery disease status post CABG, diabetes, hypertension, hyperlipidemia who is presenting with worsening chest discomfort.  The patient states he usually has chest discomfort several times a week requiring nitroglycerin but this evening at rest, the patient had onset of chest discomfort that was significantly worse than his normal angina.  It radiated to his left arm associated with some shortness of breath.  The patient reported to the emergency room where his troponin was found to be elevated.  Other than the symptoms above, the patient has been in his normal state of health. He denies any issues with hematochezia and remains on dual antiplatelet therapy and is compliant with his medications.  PAST MEDICAL HISTORY:  Coronary artery disease.  His last left heart catheterization was in July 2010 that showed an occluded vein graft to diagonal and occluded vein graft to ramus.  His LIMA to LAD was patent, However, there was stenosis of 85% distal to the LIMA anastomosis.  His free RIMA to OM was patent.  Other past medical history as above in HPI.  SOCIAL HISTORY:  History of tobacco, but none current.  No alcohol.  He was at home with his wife.  FAMILY HISTORY:  Noncontributory.  ALLERGIES:  CODEINE.  MEDICATIONS: 1. Aspirin 325 daily. 2. Plavix 75 mg daily. 3. Toprol-XL 50 mg daily. 4. Crestor, unclear dose. 5. Glimepiride, unclear dose. 6. Metformin 1000 mg b.i.d. 7. Cardizem  CD per his list 420 mg daily. 8. Quinaretic 20/12.5 mg daily. 9. Imdur 60 mg daily.  REVIEW OF SYSTEMS:  As in HPI.  The patient also endorses palpitations and he wore a cardiac event monitor at home that was nondiagnostic per him.  Other systems as in HPI, otherwise negative.  PHYSICAL EXAMINATION:  VITAL SIGNS:  Temperature is 98.2, blood pressure 138/71, pulse of 74, respirations 14, satting 99% on room air. GENERAL:  No acute distress. HEENT:  Normocephalic, atraumatic. NECK:  Supple.  There is no JVD. HEART:  Regular rate and rhythm without murmur, rub or gallop. LUNGS:  Clear bilaterally. ABDOMEN:  Soft, nontender, nondistended. EXTREMITIES:  Without edema. SKIN:  Warm and dry. PSYCHIATRIC:  The patient is appropriate. NEURO:  Nonfocal. MUSCULOSKELETAL:  5/5 bilateral upper and lower extremity strength.  LABORATORY DATA:  Sodium 133, potassium 3.9, chloride 100, CO2 22, BUN 15, creatinine 1.2, glucose 147.  White count 8, hemoglobin 14, hematocrit 40, platelet count 163.  Troponin 0.54, CK-MB 4.4, CK 147.Chest x-ray is within normal limits.  EKG shows normal sinus rhythm without any significant ST-segment or T-wave abnormalities.  ASSESSMENT:  A 60 year old white male with known coronary artery disease presenting with a  non-ST-segment elevation myocardial infarction.  He is currently having very mild ongoing chest discomfort.  PLAN:  The patient will be admitted to step-down unit.  His home medications including aspirin and Plavix will be continued.  We will start him on a heparin and a nitroglycerin drip.  He will be given morphine p.r.n. for chest discomfort.  He will be made n.p.o. for a probable left heart catheterization.  It is possible that his native LAD lesion distal to the LIMA anastomosis may need to be intervened on.     Henderson Cloud, MD     SGA/MEDQ  D:  06/24/2010  T:  06/24/2010  Job:  [id number]  Electronically Signed by Raynelle Bring MD on  09/26/2010 09:52:39 AM

## 2011-01-04 ENCOUNTER — Other Ambulatory Visit: Payer: Self-pay

## 2011-01-04 ENCOUNTER — Encounter: Payer: Self-pay | Admitting: *Deleted

## 2011-01-04 ENCOUNTER — Emergency Department (HOSPITAL_COMMUNITY): Payer: BC Managed Care – PPO

## 2011-01-04 ENCOUNTER — Inpatient Hospital Stay (HOSPITAL_COMMUNITY)
Admission: EM | Admit: 2011-01-04 | Discharge: 2011-01-06 | DRG: 132 | Disposition: A | Discharge status: Home / Self Care | Payer: BC Managed Care – PPO | Attending: Interventional Cardiology | Admitting: Interventional Cardiology

## 2011-01-04 DIAGNOSIS — I200 Unstable angina: Secondary | ICD-10-CM | POA: Diagnosis present

## 2011-01-04 DIAGNOSIS — R002 Palpitations: Secondary | ICD-10-CM | POA: Diagnosis present

## 2011-01-04 DIAGNOSIS — Z7982 Long term (current) use of aspirin: Secondary | ICD-10-CM

## 2011-01-04 DIAGNOSIS — Z951 Presence of aortocoronary bypass graft: Secondary | ICD-10-CM

## 2011-01-04 DIAGNOSIS — I10 Essential (primary) hypertension: Secondary | ICD-10-CM

## 2011-01-04 DIAGNOSIS — I2510 Atherosclerotic heart disease of native coronary artery without angina pectoris: Principal | ICD-10-CM | POA: Diagnosis present

## 2011-01-04 DIAGNOSIS — E119 Type 2 diabetes mellitus without complications: Secondary | ICD-10-CM | POA: Diagnosis present

## 2011-01-04 DIAGNOSIS — E785 Hyperlipidemia, unspecified: Secondary | ICD-10-CM

## 2011-01-04 DIAGNOSIS — E871 Hypo-osmolality and hyponatremia: Secondary | ICD-10-CM | POA: Diagnosis present

## 2011-01-04 HISTORY — DX: Atherosclerotic heart disease of native coronary artery without angina pectoris: I25.10

## 2011-01-04 HISTORY — DX: Essential (primary) hypertension: I10

## 2011-01-04 LAB — CBC
HCT: 41.7 % (ref 39.0–52.0)
MCHC: 35.5 g/dL (ref 30.0–36.0)
MCV: 83.9 fL (ref 78.0–100.0)
RDW: 12.8 % (ref 11.5–15.5)

## 2011-01-04 LAB — PROTIME-INR
INR: 1.1 (ref 0.00–1.49)
Prothrombin Time: 14.4 seconds (ref 11.6–15.2)

## 2011-01-04 LAB — DIFFERENTIAL
Basophils Absolute: 0 10*3/uL (ref 0.0–0.1)
Basophils Relative: 0 % (ref 0–1)
Eosinophils Relative: 1 % (ref 0–5)
Monocytes Absolute: 1 10*3/uL (ref 0.1–1.0)

## 2011-01-04 LAB — COMPREHENSIVE METABOLIC PANEL
AST: 16 U/L (ref 0–37)
Albumin: 4 g/dL (ref 3.5–5.2)
Calcium: 9.5 mg/dL (ref 8.4–10.5)
Creatinine, Ser: 0.91 mg/dL (ref 0.50–1.35)
GFR calc non Af Amer: 89 mL/min — ABNORMAL LOW (ref >90)

## 2011-01-04 LAB — CARDIAC PANEL(CRET KIN+CKTOT+MB+TROPI)
Relative Index: INVALID (ref 0.0–2.5)
Relative Index: INVALID (ref 0.0–2.5)
Total CK: 105 U/L (ref 7–232)
Total CK: 99 U/L (ref 7–232)

## 2011-01-04 LAB — HEMOGLOBIN A1C
Hgb A1c MFr Bld: 6.2 % — ABNORMAL HIGH (ref <5.7)
Mean Plasma Glucose: 131 mg/dL — ABNORMAL HIGH (ref <117)

## 2011-01-04 MED ORDER — ONDANSETRON HCL 4 MG/2ML IJ SOLN: 4.0000 mg | Freq: Four times a day (QID) | INTRAMUSCULAR | Status: DC | PRN

## 2011-01-04 MED ORDER — FLUTICASONE PROPIONATE 50 MCG/ACT NA SUSP
2.0000 | Freq: Every day | NASAL | Status: DC | PRN
  Filled 2011-01-04: qty 16

## 2011-01-04 MED ORDER — ISOSORBIDE MONONITRATE ER 60 MG PO TB24
120.0000 mg | ORAL_TABLET | Freq: Every day | ORAL | Status: DC
  Administered 2011-01-04 – 2011-01-06 (×3): 120 mg via ORAL
  Filled 2011-01-04 (×3): qty 2

## 2011-01-04 MED ORDER — NITROGLYCERIN 0.4 MG SL SUBL: 0.4000 mg | SUBLINGUAL_TABLET | SUBLINGUAL | Status: DC | PRN

## 2011-01-04 MED ORDER — DILTIAZEM HCL ER BEADS 300 MG PO CP24
420.0000 mg | ORAL_CAPSULE | Freq: Every day | ORAL | Status: DC
  Administered 2011-01-04 – 2011-01-06 (×3): 420 mg via ORAL
  Filled 2011-01-04 (×3): qty 1

## 2011-01-04 MED ORDER — HYDROCODONE-ACETAMINOPHEN 5-325 MG PO TABS: 1.0000 | ORAL_TABLET | Freq: Four times a day (QID) | ORAL | Status: DC | PRN

## 2011-01-04 MED ORDER — HYDROCHLOROTHIAZIDE 12.5 MG PO CAPS
12.5000 mg | ORAL_CAPSULE | Freq: Every day | ORAL | Status: DC
  Administered 2011-01-04: 12.5 mg via ORAL
  Filled 2011-01-04 (×2): qty 1

## 2011-01-04 MED ORDER — HEPARIN SOD (PORCINE) IN D5W 100 UNIT/ML IV SOLN
14.0000 [IU]/kg/h | INTRAVENOUS | Status: DC
  Administered 2011-01-04 – 2011-01-05 (×2): 14 [IU]/kg/h via INTRAVENOUS
  Filled 2011-01-04 (×2): qty 250

## 2011-01-04 MED ORDER — LISINOPRIL 20 MG PO TABS
20.0000 mg | ORAL_TABLET | Freq: Every day | ORAL | Status: DC
  Administered 2011-01-04 – 2011-01-06 (×3): 20 mg via ORAL
  Filled 2011-01-04 (×3): qty 1

## 2011-01-04 MED ORDER — HEPARIN SOD (PORCINE) IN D5W 100 UNIT/ML IV SOLN: 1150.0000 [IU]/kg/h | INTRAVENOUS | Status: DC

## 2011-01-04 MED ORDER — ASPIRIN 325 MG PO TABS
325.0000 mg | ORAL_TABLET | Freq: Every day | ORAL | Status: DC
  Administered 2011-01-04 – 2011-01-06 (×3): 325 mg via ORAL
  Filled 2011-01-04 (×3): qty 1

## 2011-01-04 MED ORDER — METFORMIN HCL 500 MG PO TABS
1000.0000 mg | ORAL_TABLET | Freq: Two times a day (BID) | ORAL | Status: DC
  Administered 2011-01-04 – 2011-01-06 (×4): 1000 mg via ORAL
  Filled 2011-01-04 (×6): qty 2

## 2011-01-04 MED ORDER — CLOPIDOGREL BISULFATE 75 MG PO TABS
75.0000 mg | ORAL_TABLET | Freq: Every day | ORAL | Status: DC
  Administered 2011-01-04 – 2011-01-06 (×3): 75 mg via ORAL
  Filled 2011-01-04 (×3): qty 1

## 2011-01-04 MED ORDER — NITROGLYCERIN 2 % TD OINT
0.5000 [in_us] | TOPICAL_OINTMENT | Freq: Three times a day (TID) | TRANSDERMAL | Status: DC
  Administered 2011-01-04 (×3): 0.5 [in_us] via TOPICAL
  Filled 2011-01-04: qty 30

## 2011-01-04 MED ORDER — ALPRAZOLAM 0.25 MG PO TABS: 0.2500 mg | ORAL_TABLET | Freq: Three times a day (TID) | ORAL | Status: DC | PRN

## 2011-01-04 MED ORDER — QUINAPRIL-HYDROCHLOROTHIAZIDE 20-12.5 MG PO TABS: 1.0000 | ORAL_TABLET | Freq: Every day | ORAL | Status: DC

## 2011-01-04 MED ORDER — GLIMEPIRIDE 4 MG PO TABS
4.0000 mg | ORAL_TABLET | Freq: Every day | ORAL | Status: DC
  Administered 2011-01-05: 4 mg via ORAL
  Administered 2011-01-06: 1 mg via ORAL
  Filled 2011-01-04 (×3): qty 1

## 2011-01-04 MED ORDER — HEPARIN BOLUS VIA INFUSION
4000.0000 [IU] | Freq: Once | INTRAVENOUS | Status: AC
  Administered 2011-01-04: 4000 [IU] via INTRAVENOUS
  Filled 2011-01-04: qty 4000

## 2011-01-04 MED ORDER — ROSUVASTATIN CALCIUM 20 MG PO TABS
20.0000 mg | ORAL_TABLET | Freq: Every day | ORAL | Status: DC
  Administered 2011-01-04 – 2011-01-06 (×3): 20 mg via ORAL
  Filled 2011-01-04 (×3): qty 1

## 2011-01-04 NOTE — H&P (Signed)
Admit date: 01/04/2011 Referring Physician [facility] emergency room Primary Cardiologist Verdis Prime, M.D. Chief complaint/reason for admission: Chest discomfort  HPI: 61 year old with coronary artery disease who has had multiple coronary interventions, most recently in August of 2012.  He woke up this morning and walked to the bathroom.  He felt like his heart was beating fast.  He then took a nitroglycerin.  He had some mild chest discomfort at that point.  It was not like his prior anginal pain that he had before.  This was less severe.  This was not sharp in nature.  He called EMS.  He has been pain free since he has been at the hospital.  His initial cardiac markers were mildly positive and so we are called to further evaluate.    PMH:    Past Medical History  Diagnosis Date  . Coronary artery disease   . Diabetes mellitus   . Hypertension     PSH:    Past Surgical History  Procedure Date  . Coronary artery bypass graft   . Coronary angioplasty with stent placement     ALLERGIES:   Codeine  Prior to Admit Meds:   (Not in a hospital admission) Family HX:   History reviewed. No pertinent family history. Social HX:    History   Social History  . Marital Status: Married    Spouse Name: N/A    Number of Children: N/A  . Years of Education: N/A   Occupational History  . Not on file.   Social History Main Topics  . Smoking status: Former Games developer  . Smokeless tobacco: Not on file  . Alcohol Use: Yes  . Drug Use: No  . Sexually Active:    Other Topics Concern  . Not on file   Social History Narrative  . No narrative on file     ROS:  All 11 ROS were addressed and are negative except what is stated in the HPI  PHYSICAL EXAM Filed Vitals:   01/04/11 0730  BP: 137/72  Pulse: 88  Temp:   Resp: 14   General: Well developed, well nourished, in no acute distress Head: Eyes PERRLA, No xanthomas.   Normal cephalic and atramatic  Lungs:   Clear bilaterally to  auscultation and percussion. Heart:   HRRR S1 S2             No carotid bruit.  Abdomen: Bowel sounds are positive, abdomen soft and non-tender without masses or                  Hernia's noted. Msk:  Back normal,  Normal strength and tone for age. Extremities:   No  edema.  Neuro: Alert and oriented X 3. Psych:  Good affect, responds appropriately   Labs:   Lab Results  Component Value Date   WBC 9.2 01/04/2011   HGB 14.8 01/04/2011   HCT 41.7 01/04/2011   MCV 83.9 01/04/2011   PLT 173 01/04/2011    Lab 01/04/11 0453  NA 128*  K 4.1  CL 93*  CO2 22  BUN 13  CREATININE 0.91  CALCIUM 9.5  PROT 7.4  BILITOT 0.5  ALKPHOS 84  ALT 19  AST 16  GLUCOSE 131*   Lab Results  Component Value Date   CKTOTAL 105 01/04/2011   CKMB 2.6 01/04/2011   TROPONINI 0.35* 01/04/2011   No results found for this basename: PTT   Lab Results  Component Value Date   INR 1.05 08/04/2010  INR 1.03 08/04/2010   INR 1.06 06/24/2010     Lab Results  Component Value Date   CHOL 130 06/24/2010   Lab Results  Component Value Date   HDL 44 06/24/2010   Lab Results  Component Value Date   LDLCALC 68 06/24/2010   Lab Results  Component Value Date   TRIG 92 06/24/2010   Lab Results  Component Value Date   CHOLHDL 3.0 06/24/2010   No results found for this basename: LDLDIRECT      Radiology:  @RISRSLT24 @  EKG:  Normal sinus rhythm, no significant ST segment changes.  ASSESSMENT: 1) unstable angina  PLAN:  1) rule out MI with enzymes.  Watch on telemetry.  Heparin per pharmacy protocol.  Continue aspirin, Plavix, statin.  Depending on his enzymes, he may need repeat coronary angiography in the morning.  Will keep n.p.o. past midnight.  2) mild hyponatremia.  May need to stop hydrochlorothiazide.  Corky Crafts., MD  01/04/2011  8:59 AM

## 2011-01-04 NOTE — ED Provider Notes (Signed)
History     CSN: [id number]  Arrival date & time 01/04/11  7829   First MD Initiated Contact with Patient 01/04/11 [phone]      Chief Complaint  Patient presents with  . Chest Pain    (Consider location/radiation/quality/duration/timing/severity/associated sxs/prior treatment) Patient is a 61 y.o. male presenting with chest pain. The history is provided by the patient (the pt states he had chest pain and palpitaions).  Chest Pain The chest pain began 3 - 5 hours ago. Chest pain occurs frequently. The chest pain is improving. Associated with: nothing. At its most intense, the pain is at 5/10. The pain is currently at 2/10. The quality of the pain is described as aching. The pain radiates to the left arm. Pertinent negatives for primary symptoms include no fever, no fatigue, no cough and no abdominal pain.  Pertinent negatives for associated symptoms include no claudication. He tried nitroglycerin for the symptoms. Risk factors include male gender.  Pertinent negatives for past medical history include no aneurysm and no seizures.  His family medical history is significant for CAD in family.     Past Medical History  Diagnosis Date  . Coronary artery disease   . Diabetes mellitus   . Hypertension     Past Surgical History  Procedure Date  . Coronary artery bypass graft   . Coronary angioplasty with stent placement     History reviewed. No pertinent family history.  History  Substance Use Topics  . Smoking status: Former Games developer  . Smokeless tobacco: Not on file  . Alcohol Use: Yes      Review of Systems  Constitutional: Negative for fever and fatigue.  HENT: Negative for congestion, sinus pressure and ear discharge.   Eyes: Negative for discharge.  Respiratory: Negative for cough.   Cardiovascular: Positive for chest pain. Negative for claudication.  Gastrointestinal: Negative for abdominal pain and diarrhea.  Genitourinary: Negative for frequency and hematuria.    Musculoskeletal: Negative for back pain.  Skin: Negative for rash.  Neurological: Negative for seizures and headaches.  Hematological: Negative.   Psychiatric/Behavioral: Negative for hallucinations.    Allergies  Codeine  Home Medications   Current Outpatient Rx  Name Route Sig Dispense Refill  . ALPRAZOLAM 0.25 MG PO TABS Oral Take 0.25 mg by mouth 3 (three) times daily as needed. For anxiety     . ASPIRIN 325 MG PO TABS Oral Take 325 mg by mouth daily.      Marland Kitchen LIPITOR PO Oral Take 1 tablet by mouth daily.      Marland Kitchen CLOPIDOGREL BISULFATE 75 MG PO TABS Oral Take 75 mg by mouth daily.      Marland Kitchen DILTIAZEM HCL ER BEADS 420 MG PO CP24 Oral Take 420 mg by mouth daily.      Marland Kitchen FLUTICASONE PROPIONATE 50 MCG/ACT NA SUSP Nasal Place 2 sprays into the nose daily as needed. For nasal congestion      . GLIMEPIRIDE 4 MG PO TABS Oral Take 4 mg by mouth daily before breakfast.      . HYDROCODONE-ACETAMINOPHEN 5-325 MG PO TABS Oral Take 1 tablet by mouth every 6 (six) hours as needed. For pain     . ISOSORBIDE MONONITRATE ER 120 MG PO TB24 Oral Take 120 mg by mouth daily.      Marland Kitchen METFORMIN HCL 1000 MG PO TABS Oral Take 1,000 mg by mouth 2 (two) times daily with a meal.      . NITROGLYCERIN 2 % TD OINT Transdermal Place  0.5 inches onto the skin 3 (three) times daily.      Marland Kitchen NITROGLYCERIN 0.4 MG SL SUBL Sublingual Place 0.4 mg under the tongue every 5 (five) minutes as needed.      Marland Kitchen PRESCRIPTION MEDICATION Oral Take 1 tablet by mouth 2 (two) times daily. metoprolol     . QUINAPRIL-HYDROCHLOROTHIAZIDE 20-12.5 MG PO TABS Oral Take 1 tablet by mouth daily.        BP 137/72  Pulse 88  Temp(Src) 98.7 F (37.1 C) (Oral)  Resp 14  SpO2 97%  Physical Exam  Constitutional: He is oriented to person, place, and time. He appears well-developed.  HENT:  Head: Normocephalic and atraumatic.  Eyes: Conjunctivae and EOM are normal. No scleral icterus.  Neck: Neck supple. No thyromegaly present.  Cardiovascular:  Normal rate and regular rhythm.  Exam reveals no gallop and no friction rub.   No murmur heard. Pulmonary/Chest: No stridor. He has no wheezes. He has no rales. He exhibits no tenderness.  Abdominal: He exhibits no distension. There is no tenderness. There is no rebound.  Musculoskeletal: Normal range of motion. He exhibits no edema.  Lymphadenopathy:    He has no cervical adenopathy.  Neurological: He is oriented to person, place, and time. Coordination normal.  Skin: No rash noted. No erythema.  Psychiatric: He has a normal mood and affect. His behavior is normal.    ED Course  Procedures (including critical care time)  Labs Reviewed  CARDIAC PANEL(CRET KIN+CKTOT+MB+TROPI) - Abnormal; Notable for the following:    Troponin I 0.35 (*)    All other components within normal limits  COMPREHENSIVE METABOLIC PANEL - Abnormal; Notable for the following:    Sodium 128 (*)    Chloride 93 (*)    Glucose, Bld 131 (*)    GFR calc non Af Amer 89 (*)    All other components within normal limits  CBC  DIFFERENTIAL   Dg Chest 2 View  01/04/2011  *RADIOLOGY REPORT*  Clinical Data: Rapid heart rate and left arm pain.  CHEST - 2 VIEW  Comparison: 08/04/2010  Findings: Stable postoperative changes in the mediastinum.  Normal heart size and pulmonary vascularity.  No focal airspace consolidation in the lungs.  No blunting of costophrenic angles. No pneumothorax.  No significant change since prior study.  IMPRESSION: No evidence of active pulmonary disease.  Original Report Authenticated By: Marlon Pel, M.D.     1. Chest pain      Date: 01/04/2011  Rate: 91  Rhythm: normal sinus rhythm  QRS Axis: normal  Intervals: normal  ST/T Wave abnormalities: normal  Conduction Disutrbances:none  Narrative Interpretation:   Old EKG Reviewed: none available    MDM  Chest pain and palpitations will admit to rule out.  Slightly elevated troponin        Benny Lennert, MD 01/05/11  1220

## 2011-01-04 NOTE — ED Notes (Signed)
Received pt. From home via EMS with c/o chest pain, pt. Alert and oriented, pain free on arrival, pt. Took aspirin 325mg . And 1 nitro without improvement, Nitro x 1 and nitro paste given per EMS

## 2011-01-04 NOTE — Progress Notes (Signed)
Pharmacy: Re-Heparin  A: Patient is a 61 y.o M on heparin for ACS with heparin drip started earlier today..   Heparin level now back therapeutic at 0.47.  No bleeding noted.  P:  Continue current heparin drip at 1150 units/hr for now   Dorna Leitz, PharmD, BCPS

## 2011-01-04 NOTE — Progress Notes (Signed)
ANTICOAGULATION CONSULT NOTE - Initial Consult  Pharmacy Consult for heparin Indication: chest pain/ACS  Allergies  Allergen Reactions  . Codeine Itching, Swelling and Other (See Comments)    Burning and heart flutter    Patient Measurements: Ht=67'' Wt ~83kg Adjusted Body Weight: 83kg  Vital Signs: Temp: 98.7 F (37.1 C) (01/01 0330) Temp src: Oral (01/01 0330) BP: 137/72 mmHg (01/01 0730) Pulse Rate: 88  (01/01 0730)  Labs:  Basename 01/04/11 0453  HGB 14.8  HCT 41.7  PLT 173  APTT --  LABPROT --  INR --  HEPARINUNFRC --  CREATININE 0.91  CKTOTAL 105  CKMB 2.6  TROPONINI 0.35*    Medical History: Past Medical History  Diagnosis Date  . Coronary artery disease   . Diabetes mellitus   . Hypertension     Assessment: 61 yo amle here with CP to start heparin, patient noted for cath in am.  Goal of Therapy:  Heparin level 0.3-0.7 units/ml   Plan:  1) Heparin bolus of 4000 units followed by 1150 units/kg/hr (~14 units/kg/hr) 2) Heparin level in 6 hrs and daily with CBC daily   Benny Lennert 01/04/2011,9:09 AM

## 2011-01-05 ENCOUNTER — Other Ambulatory Visit: Payer: Self-pay

## 2011-01-05 LAB — CBC
HCT: 38.6 % — ABNORMAL LOW (ref 39.0–52.0)
MCH: 30 pg (ref 26.0–34.0)
MCHC: 35.2 g/dL (ref 30.0–36.0)
RDW: 13 % (ref 11.5–15.5)

## 2011-01-05 LAB — BASIC METABOLIC PANEL
BUN: 12 mg/dL (ref 6–23)
Creatinine, Ser: 0.96 mg/dL (ref 0.50–1.35)
GFR calc Af Amer: >90 mL/min (ref >90)
GFR calc non Af Amer: 87 mL/min — ABNORMAL LOW (ref >90)
Glucose, Bld: 138 mg/dL — ABNORMAL HIGH (ref 70–99)

## 2011-01-05 LAB — GLUCOSE, CAPILLARY: Glucose-Capillary: 63 mg/dL — ABNORMAL LOW (ref 70–99)

## 2011-01-05 MED ORDER — METOPROLOL TARTRATE 12.5 MG HALF TABLET
12.5000 mg | ORAL_TABLET | Freq: Two times a day (BID) | ORAL | Status: DC
  Administered 2011-01-05 – 2011-01-06 (×3): 12.5 mg via ORAL
  Filled 2011-01-05 (×4): qty 1

## 2011-01-05 NOTE — Progress Notes (Signed)
Utilization review completed. Matthew Pais, RN, BSN. 01/05/11 

## 2011-01-05 NOTE — Progress Notes (Signed)
Patient Name: [name] [name] Date of Encounter: 01/05/2011     SUBJECTIVE: History reviewed with the patient. This was very similar to August admission. He awakened and felt that his heart was pounding. He then experienced chest and left arm discomfort. Total duration of the discomfort was one hour and a half.  TELEMETRY:  Normal sinus rhythm: Filed Vitals:   01/04/11 1100 01/04/11 1427 01/04/11 2057 01/05/11 0546  BP:  138/75 103/65 111/61  Pulse:  77 71 60  Temp:  98.1 F (36.7 C) 98.4 F (36.9 C) 97.7 F (36.5 C)  TempSrc:  Oral Oral Oral  Resp:  18 18 18   Height: 5\' 7"  (1.702 m)     Weight: 80.74 kg (178 lb)     SpO2:  95% 96% 98%    Intake/Output Summary (Last 24 hours) at 01/05/11 0835 Last data filed at 01/04/11 1900  Gross per 24 hour  Intake    360 ml  Output    600 ml  Net   -240 ml    LABS: Basic Metabolic Panel:  Basename 01/05/11 0540 01/04/11 0453  NA 126* 128*  K 3.6 4.1  CL 92* 93*  CO2 23 22  GLUCOSE 138* 131*  BUN 12 13  CREATININE 0.96 0.91  CALCIUM 9.5 9.5  MG -- --  PHOS -- --   CBC:  Basename 01/05/11 0540 01/04/11 0453  WBC 7.8 9.2  NEUTROABS -- 6.8  HGB 13.6 14.8  HCT 38.6* 41.7  MCV 85.2 83.9  PLT 158 173   Cardiac Enzymes:  Basename 01/04/11 1738 01/04/11 1117 01/04/11 0453  CKTOTAL 97 99 105  CKMB 2.7 2.9 2.6  CKMBINDEX -- -- --  TROPONINI <0.30 0.38* 0.35*   BNP: No components found with this basename: POCBNP:3 Hemoglobin A1C:  Basename 01/04/11 1117  HGBA1C 6.2*   Fasting Lipid Panel: No results found for this basename: CHOL,HDL,LDLCALC,TRIG,CHOLHDL,LDLDIRECT in the last 72 hours  Radiology/Studies:  No acute cardiopulmonary abnormality noted on chest x-ray 01/04/2011  Physical Exam: Blood pressure 111/61, pulse 60, temperature 97.7 F (36.5 C), temperature source Oral, resp. rate 18, height 5\' 7"  (1.702 m), weight 80.74 kg (178 lb), SpO2 98.00%. Weight change:    S4 gallop. Soft 1/6 systolic  murmur.  ASSESSMENT:  1. Coronary atherosclerotic heart disease with acute coronary syndrome possibly precipitated by arrhythmia with minimal enzyme bump. This is a similar presentation to August. In August we were unable to dilate and stent the circumflex stenosis distal to the graft insertion site because of it being undilatable.  2. Hypertension  3. Hyponatremia  Plan:  1. Medical therapy, as interventional approach 6 months ago was unsuccessful.  2. Discontinue heparin.  3. May need long-acting nitrate therapy  4. Hyponatremia possibly due to diuretic therapy.  Selinda Eon 01/05/2011, 8:35 AM

## 2011-01-06 ENCOUNTER — Other Ambulatory Visit: Payer: Self-pay

## 2011-01-06 DIAGNOSIS — E785 Hyperlipidemia, unspecified: Secondary | ICD-10-CM

## 2011-01-06 DIAGNOSIS — I10 Essential (primary) hypertension: Secondary | ICD-10-CM

## 2011-01-06 NOTE — Discharge Summary (Signed)
Patient ID: [name] [name] MRN: [id number] DOB/AGE: 63/16/1952 63 y.o.  Admit date: 01/04/2011 Discharge date: 01/06/2011  Primary Discharge Diagnosis: Acute coronary syndrome Secondary Discharge Diagnosis: 1. Coronary artery disease  2. Hypertension  3. Hyperlipidemia  4. Palpitations preceding the episodes of angina, raising suspicion of atrial fib or flutter  5 Hyponatremia  Significant Diagnostic Studies: none  Consults: none  Hospital Course: The patient was admited with mild chest pain and had trace elevation in troponins. His prior angiogram from 08/2010 was reviewed. The problem was an undilatable stenosis in the OM beyond the graft insertion site. Assumptions were made after discussion with the patient that this presentation was likely due to the same problem. This region is collateralized. It was decided to continue med therapy without re-investigation unless MI or refractory symptoms.   Discharge Exam: Blood pressure 125/68, pulse 63, temperature 97.4 F (36.3 C), temperature source Oral, resp. rate 18, height 5\' 7"  (1.702 m), weight 80.8 kg (178 lb 2.1 oz), SpO2 95.00%.   No significant abnormality. Labs:   Lab Results  Component Value Date   WBC 7.8 01/05/2011   HGB 13.6 01/05/2011   HCT 38.6* 01/05/2011   MCV 85.2 01/05/2011   PLT 158 01/05/2011    Lab 01/05/11 0540 01/04/11 0453  NA 126* --  K 3.6 --  CL 92* --  CO2 23 --  BUN 12 --  CREATININE 0.96 --  CALCIUM 9.5 --  PROT -- 7.4  BILITOT -- 0.5  ALKPHOS -- 84  ALT -- 19  AST -- 16  GLUCOSE 138* --   Lab Results  Component Value Date   CKTOTAL 97 01/04/2011   CKMB 2.7 01/04/2011   TROPONINI <0.30 01/04/2011    Lab Results  Component Value Date   CHOL 130 06/24/2010   Lab Results  Component Value Date   HDL 44 06/24/2010   Lab Results  Component Value Date   LDLCALC 68 06/24/2010   Lab Results  Component Value Date   TRIG 92 06/24/2010   Lab Results  Component Value Date   CHOLHDL 3.0 06/24/2010     No results found for this basename: LDLDIRECT      Radiology: No abnormality on admission CXR  EKG: No acute changes  FOLLOW UP PLANS AND APPOINTMENTS  Current Discharge Medication List    CONTINUE these medications which have NOT CHANGED   Details  ALPRAZolam (XANAX) 0.25 MG tablet Take 0.25 mg by mouth 3 (three) times daily as needed. For anxiety     aspirin 325 MG tablet Take 325 mg by mouth daily.      Atorvastatin Calcium (LIPITOR PO) Take 1 tablet by mouth daily.      clopidogrel (PLAVIX) 75 MG tablet Take 75 mg by mouth daily.      diltiazem (TIAZAC) 420 MG 24 hr capsule Take 420 mg by mouth daily.      fluticasone (FLONASE) 50 MCG/ACT nasal spray Place 2 sprays into the nose daily as needed. For nasal congestion      glimepiride (AMARYL) 4 MG tablet Take 4 mg by mouth daily before breakfast.      HYDROcodone-acetaminophen (NORCO) 5-325 MG per tablet Take 1 tablet by mouth every 6 (six) hours as needed. For pain     isosorbide mononitrate (IMDUR) 120 MG 24 hr tablet Take 120 mg by mouth daily.      metFORMIN (GLUCOPHAGE) 1000 MG tablet Take 1,000 mg by mouth 2 (two) times daily with a meal.  metoprolol (TOPROL-XL) 50 MG 24 hr tablet Take 50 mg by mouth 2 (two) times daily.     nitroGLYCERIN (NITROSTAT) 0.4 MG SL tablet Place 0.4 mg under the tongue every 5 (five) minutes as needed.      quinapril-hydrochlorothiazide (ACCURETIC) 20-12.5 MG per tablet Take 1 tablet by mouth daily.        STOP taking these medications     nitroGLYCERIN (NITROGLYN) 2 % ointment        Follow-up Information    Follow up with Lesleigh Noe, MD. (Already scheduled for later thi month)    Contact information:   [address] [place] [address] [phone]          BRING ALL MEDICATIONS WITH YOU TO FOLLOW UP APPOINTMENTS  Time spent with patient to include physician time: 30 min Signed: Lesleigh Noe 01/06/2011, 7:48 AM

## 2011-06-02 ENCOUNTER — Observation Stay (HOSPITAL_COMMUNITY)
Admission: EM | Admit: 2011-06-02 | Discharge: 2011-06-02 | Disposition: A | Discharge status: Home / Self Care | Payer: BC Managed Care – PPO | Attending: Internal Medicine | Admitting: Internal Medicine

## 2011-06-02 ENCOUNTER — Encounter (HOSPITAL_COMMUNITY): Payer: Self-pay | Admitting: Emergency Medicine

## 2011-06-02 ENCOUNTER — Emergency Department (HOSPITAL_COMMUNITY): Payer: BC Managed Care – PPO

## 2011-06-02 DIAGNOSIS — E871 Hypo-osmolality and hyponatremia: Secondary | ICD-10-CM | POA: Insufficient documentation

## 2011-06-02 DIAGNOSIS — Z951 Presence of aortocoronary bypass graft: Secondary | ICD-10-CM | POA: Insufficient documentation

## 2011-06-02 DIAGNOSIS — M25529 Pain in unspecified elbow: Secondary | ICD-10-CM | POA: Insufficient documentation

## 2011-06-02 DIAGNOSIS — I252 Old myocardial infarction: Secondary | ICD-10-CM | POA: Insufficient documentation

## 2011-06-02 DIAGNOSIS — E1165 Type 2 diabetes mellitus with hyperglycemia: Secondary | ICD-10-CM

## 2011-06-02 DIAGNOSIS — E119 Type 2 diabetes mellitus without complications: Secondary | ICD-10-CM

## 2011-06-02 DIAGNOSIS — R0602 Shortness of breath: Secondary | ICD-10-CM | POA: Insufficient documentation

## 2011-06-02 DIAGNOSIS — R072 Precordial pain: Secondary | ICD-10-CM

## 2011-06-02 DIAGNOSIS — R079 Chest pain, unspecified: Principal | ICD-10-CM | POA: Diagnosis present

## 2011-06-02 DIAGNOSIS — I10 Essential (primary) hypertension: Secondary | ICD-10-CM | POA: Diagnosis present

## 2011-06-02 DIAGNOSIS — I2510 Atherosclerotic heart disease of native coronary artery without angina pectoris: Secondary | ICD-10-CM | POA: Insufficient documentation

## 2011-06-02 DIAGNOSIS — E782 Mixed hyperlipidemia: Secondary | ICD-10-CM

## 2011-06-02 DIAGNOSIS — E785 Hyperlipidemia, unspecified: Secondary | ICD-10-CM | POA: Diagnosis present

## 2011-06-02 HISTORY — DX: Type 2 diabetes mellitus without complications: E11.9

## 2011-06-02 LAB — COMPREHENSIVE METABOLIC PANEL
ALT: 20 U/L (ref 0–53)
Albumin: 3.6 g/dL (ref 3.5–5.2)
BUN: 16 mg/dL (ref 6–23)
CO2: 23 mEq/L (ref 19–32)
Calcium: 9.4 mg/dL (ref 8.4–10.5)
Calcium: 9.2 mg/dL (ref 8.4–10.5)
Creatinine, Ser: 1.09 mg/dL (ref 0.50–1.35)
GFR calc Af Amer: 82 mL/min — ABNORMAL LOW (ref >90)
GFR calc Af Amer: >90 mL/min (ref >90)
GFR calc non Af Amer: 70 mL/min — ABNORMAL LOW (ref >90)
Glucose, Bld: 134 mg/dL — ABNORMAL HIGH (ref 70–99)
Glucose, Bld: 116 mg/dL — ABNORMAL HIGH (ref 70–99)
Sodium: 128 mEq/L — ABNORMAL LOW (ref 135–145)
Sodium: 130 mEq/L — ABNORMAL LOW (ref 135–145)
Total Protein: 7.2 g/dL (ref 6.0–8.3)
Total Protein: 6.7 g/dL (ref 6.0–8.3)

## 2011-06-02 LAB — GLUCOSE, CAPILLARY
Glucose-Capillary: 133 mg/dL — ABNORMAL HIGH (ref 70–99)
Glucose-Capillary: 119 mg/dL — ABNORMAL HIGH (ref 70–99)
Glucose-Capillary: 127 mg/dL — ABNORMAL HIGH (ref 70–99)

## 2011-06-02 LAB — CBC
HCT: 38.2 % — ABNORMAL LOW (ref 39.0–52.0)
Hemoglobin: 14.1 g/dL (ref 13.0–17.0)
Hemoglobin: 13.5 g/dL (ref 13.0–17.0)
MCH: 31.1 pg (ref 26.0–34.0)
MCHC: 36.5 g/dL — ABNORMAL HIGH (ref 30.0–36.0)
MCHC: 35.3 g/dL (ref 30.0–36.0)
MCV: 85 fL (ref 78.0–100.0)
MCV: 84.9 fL (ref 78.0–100.0)
WBC: 8.9 10*3/uL (ref 4.0–10.5)

## 2011-06-02 LAB — CARDIAC PANEL(CRET KIN+CKTOT+MB+TROPI)
CK, MB: 2 ng/mL (ref 0.3–4.0)
CK, MB: 2 ng/mL (ref 0.3–4.0)
Relative Index: INVALID (ref 0.0–2.5)
Total CK: 89 U/L (ref 7–232)
Troponin I: <0.3 ng/mL (ref <0.30)

## 2011-06-02 LAB — DIFFERENTIAL
Basophils Relative: 1 % (ref 0–1)
Eosinophils Absolute: 0.2 10*3/uL (ref 0.0–0.7)
Eosinophils Relative: 3 % (ref 0–5)
Lymphs Abs: 1.8 10*3/uL (ref 0.7–4.0)
Monocytes Absolute: 0.7 10*3/uL (ref 0.1–1.0)
Monocytes Relative: 8 % (ref 3–12)
Neutrophils Relative %: 68 % (ref 43–77)

## 2011-06-02 MED ORDER — MORPHINE SULFATE 2 MG/ML IJ SOLN
2.0000 mg | Freq: Once | INTRAMUSCULAR | Status: AC
  Administered 2011-06-02: 2 mg via INTRAVENOUS
  Filled 2011-06-02: qty 1

## 2011-06-02 MED ORDER — ASPIRIN 325 MG PO TABS: 325.0000 mg | ORAL_TABLET | Freq: Every day | ORAL | Status: DC

## 2011-06-02 MED ORDER — ONDANSETRON HCL 4 MG/2ML IJ SOLN: 4.0000 mg | Freq: Four times a day (QID) | INTRAMUSCULAR | Status: DC | PRN

## 2011-06-02 MED ORDER — HEPARIN (PORCINE) IN NACL 100-0.45 UNIT/ML-% IJ SOLN
1150.0000 [IU]/h | INTRAMUSCULAR | Status: DC
  Administered 2011-06-02: 1150 [IU]/h via INTRAVENOUS
  Filled 2011-06-02 (×2): qty 250

## 2011-06-02 MED ORDER — ACETAMINOPHEN 325 MG PO TABS: 650.0000 mg | ORAL_TABLET | Freq: Four times a day (QID) | ORAL | Status: DC | PRN

## 2011-06-02 MED ORDER — SODIUM CHLORIDE 0.9 % IV SOLN
INTRAVENOUS | Status: AC
  Administered 2011-06-02: 04:00:00 via INTRAVENOUS

## 2011-06-02 MED ORDER — FLUTICASONE PROPIONATE 50 MCG/ACT NA SUSP: 2.0000 | Freq: Every day | NASAL | Status: DC | PRN

## 2011-06-02 MED ORDER — ALPRAZOLAM 0.25 MG PO TABS: 0.2500 mg | ORAL_TABLET | Freq: Three times a day (TID) | ORAL | Status: DC | PRN

## 2011-06-02 MED ORDER — ONDANSETRON HCL 4 MG PO TABS: 4.0000 mg | ORAL_TABLET | Freq: Four times a day (QID) | ORAL | Status: DC | PRN

## 2011-06-02 MED ORDER — ASPIRIN EC 325 MG PO TBEC
325.0000 mg | DELAYED_RELEASE_TABLET | Freq: Every day | ORAL | Status: DC
  Administered 2011-06-02: 325 mg via ORAL
  Filled 2011-06-02: qty 1

## 2011-06-02 MED ORDER — HEPARIN BOLUS VIA INFUSION
4000.0000 [IU] | Freq: Once | INTRAVENOUS | Status: AC
  Administered 2011-06-02: 4000 [IU] via INTRAVENOUS

## 2011-06-02 MED ORDER — INSULIN ASPART 100 UNIT/ML ~~LOC~~ SOLN
0.0000 [IU] | Freq: Three times a day (TID) | SUBCUTANEOUS | Status: DC
  Administered 2011-06-02 (×2): 1 [IU] via SUBCUTANEOUS

## 2011-06-02 MED ORDER — QUINAPRIL HCL 20 MG PO TABS: 20.0000 mg | ORAL_TABLET | Freq: Every day | ORAL | Status: DC

## 2011-06-02 MED ORDER — NITROGLYCERIN 2 % TD OINT
1.0000 [in_us] | TOPICAL_OINTMENT | Freq: Once | TRANSDERMAL | Status: AC
  Administered 2011-06-02: 1 [in_us] via TOPICAL
  Filled 2011-06-02: qty 1

## 2011-06-02 MED ORDER — LISINOPRIL 20 MG PO TABS
20.0000 mg | ORAL_TABLET | Freq: Every day | ORAL | Status: DC
  Administered 2011-06-02: 20 mg via ORAL
  Filled 2011-06-02: qty 1

## 2011-06-02 MED ORDER — METOPROLOL SUCCINATE ER 50 MG PO TB24
50.0000 mg | ORAL_TABLET | Freq: Two times a day (BID) | ORAL | Status: DC
  Administered 2011-06-02: 50 mg via ORAL
  Filled 2011-06-02 (×2): qty 1

## 2011-06-02 MED ORDER — METOPROLOL SUCCINATE ER 50 MG PO TB24: 50.0000 mg | ORAL_TABLET | Freq: Every day | ORAL | Status: DC

## 2011-06-02 MED ORDER — DILTIAZEM HCL ER BEADS 300 MG PO CP24
420.0000 mg | ORAL_CAPSULE | Freq: Every day | ORAL | Status: DC
  Administered 2011-06-02: 420 mg via ORAL
  Filled 2011-06-02: qty 1

## 2011-06-02 MED ORDER — SODIUM CHLORIDE 0.9 % IV SOLN: INTRAVENOUS | Status: DC

## 2011-06-02 MED ORDER — NITROGLYCERIN 0.4 MG SL SUBL: 0.4000 mg | SUBLINGUAL_TABLET | SUBLINGUAL | Status: DC | PRN

## 2011-06-02 MED ORDER — SODIUM CHLORIDE 0.9 % IJ SOLN: 3.0000 mL | Freq: Two times a day (BID) | INTRAMUSCULAR | Status: DC

## 2011-06-02 MED ORDER — ISOSORBIDE MONONITRATE ER 60 MG PO TB24
120.0000 mg | ORAL_TABLET | Freq: Every day | ORAL | Status: DC
  Administered 2011-06-02: 120 mg via ORAL
  Filled 2011-06-02: qty 2

## 2011-06-02 MED ORDER — ACETAMINOPHEN 650 MG RE SUPP: 650.0000 mg | Freq: Four times a day (QID) | RECTAL | Status: DC | PRN

## 2011-06-02 MED ORDER — ATORVASTATIN CALCIUM 40 MG PO TABS
40.0000 mg | ORAL_TABLET | Freq: Every day | ORAL | Status: DC
  Administered 2011-06-02: 40 mg via ORAL
  Filled 2011-06-02: qty 1

## 2011-06-02 MED ORDER — CLOPIDOGREL BISULFATE 75 MG PO TABS
75.0000 mg | ORAL_TABLET | Freq: Every day | ORAL | Status: DC
  Administered 2011-06-02: 75 mg via ORAL
  Filled 2011-06-02 (×2): qty 1

## 2011-06-02 MED ORDER — TRAMADOL HCL 50 MG PO TABS: 50.0000 mg | ORAL_TABLET | Freq: Four times a day (QID) | ORAL | Status: DC | PRN

## 2011-06-02 NOTE — ED Notes (Signed)
CALLED FLOOR TO GIVE REPORT , NURSE NOT READY AT THIS TIME.

## 2011-06-02 NOTE — Discharge Summary (Addendum)
Patient ID: [name] [name] MRN: [id number] DOB/AGE: July 23, 1950 61 y.o.  Admit date: 06/02/2011 Discharge date: 06/02/2011  Primary Discharge Diagnosis: Dyspnea and palpitations, uncertain cause. Myocardial infarction excluded. Severe sinus bradycardia noted at rest. The beta blocker dose was decreased by 50% Secondary Discharge Diagnosis: 1. Coronary atherosclerosis with prior CABG and drug-eluting stent implantations  2. Hypertension  3. Diabetes mellitus  4. Hyperlipidemia  5. Hyponatremia due to diuretic therapy  Significant Diagnostic Studies: Electrocardiogram. Telemetry monitoring.  Consults: None.  Hospital Course: The patient developed a palpitation that caused him to feel he was bradycardic. He did not measure his pulse. During this time he felt short of breath. After lasting 20 minutes he called EMS. Upon EMS arrival there was no arrhythmia although he continued to have this sensation although slightly less severe. There is no significant chest pain. Nitroglycerin did not help it. He denied tachycardia/tachypalpitations. He has been active recently without limitations.  Hydrochlorothiazide will be discontinued due to hyponatremia.   Discharge Exam: Blood pressure 114/61, pulse 50, temperature 97.5 F (36.4 C), temperature source Tympanic, resp. rate 14, height 5' 8.11" (1.73 m), weight 84.1 kg (185 lb 6.5 oz), SpO2 96.00%.   The chest is clear.  Cardiac exam reveals a 2 of 6 systolic murmur left lower sternal border with radiation to right upper sternal border  Neurological exam is intact.  Labs:   Lab Results  Component Value Date   WBC 8.9 06/02/2011   HGB 13.5 06/02/2011   HCT 38.2* 06/02/2011   MCV 84.9 06/02/2011   PLT 159 06/02/2011     Lab 06/02/11 0705  NA 130*  K 3.7  CL 93*  CO2 24  BUN 15  CREATININE 0.86  CALCIUM 9.2  PROT 6.7  BILITOT 0.4  ALKPHOS 78  ALT 20  AST 23  GLUCOSE 116*   Lab Results  Component Value Date   CKTOTAL 89  06/02/2011   CKMB 2.0 06/02/2011   TROPONINI <0.30 06/02/2011    Lab Results  Component Value Date   CHOL 130 06/24/2010   Lab Results  Component Value Date   HDL 44 06/24/2010   Lab Results  Component Value Date   LDLCALC 68 06/24/2010   Lab Results  Component Value Date   TRIG 92 06/24/2010   Lab Results  Component Value Date   CHOLHDL 3.0 06/24/2010   No results found for this basename: LDLDIRECT      Radiology: No acute abnormality EKG: Normal  FOLLOW UP PLANS AND APPOINTMENTS  Medication List  As of 06/02/2011  4:56 PM   STOP taking these medications         quinapril-hydrochlorothiazide 20-12.5 MG per tablet         TAKE these medications         ALPRAZolam 0.25 MG tablet   Commonly known as: XANAX   Take 0.25 mg by mouth 3 (three) times daily as needed. For anxiety      aspirin 325 MG tablet   Take 325 mg by mouth daily.      atorvastatin 40 MG tablet   Commonly known as: LIPITOR   Take 40 mg by mouth daily.      cholecalciferol 1000 UNITS tablet   Commonly known as: VITAMIN D   Take 1,000 Units by mouth daily.      clopidogrel 75 MG tablet   Commonly known as: PLAVIX   Take 75 mg by mouth daily.      diltiazem 420 MG  24 hr capsule   Commonly known as: TIAZAC   Take 420 mg by mouth daily.      fluticasone 50 MCG/ACT nasal spray   Commonly known as: FLONASE   Place 2 sprays into the nose daily as needed. For nasal congestion        isosorbide mononitrate 120 MG 24 hr tablet   Commonly known as: IMDUR   Take 120 mg by mouth daily.      metFORMIN 1000 MG tablet   Commonly known as: GLUCOPHAGE   Take 1,000 mg by mouth 2 (two) times daily with a meal.      metoprolol succinate 50 MG 24 hr tablet   Commonly known as: TOPROL-XL   Take 1 tablet (50 mg total) by mouth daily.      nitroGLYCERIN 0.4 MG SL tablet   Commonly known as: NITROSTAT   Place 0.4 mg under the tongue every 5 (five) minutes x 3 doses as needed. For chest pain       quinapril 20 MG tablet   Commonly known as: ACCUPRIL   Take 1 tablet (20 mg total) by mouth at bedtime.      traMADol 50 MG tablet   Commonly known as: ULTRAM   Take 50 mg by mouth every 6 (six) hours as needed. For pain.           Follow-up Information    Follow up with Lesleigh Noe, MD. (As previously scheduled)    Contact information:   [address] [place] [address] [phone]          BRING ALL MEDICATIONS WITH YOU TO FOLLOW UP APPOINTMENTS  Time spent with patient to include physician time: 20 minutes Signed: Lesleigh Noe 06/02/2011, 4:56 PM

## 2011-06-02 NOTE — Progress Notes (Signed)
ANTICOAGULATION CONSULT NOTE - Initial Consult  Pharmacy Consult for heparin Indication: chest pain/ACS  Allergies  Allergen Reactions  . Avandia (Rosiglitazone) Other (See Comments)    Weight gain  . Codeine Itching, Swelling and Other (See Comments)    Burning and heart flutter dizziness    Patient Measurements: Height: 5' 8.11" (173 cm) Weight: 178 lb 2.1 oz (80.8 kg) IBW/kg (Calculated) : 68.65  Heparin Dosing Weight: 80.8  Vital Signs: Temp: 98.6 F (37 C) (05/30 0252) Temp src: Oral (05/30 0252) BP: 119/64 mmHg (05/30 0423) Pulse Rate: 55  (05/30 0423)  Labs:  Basename 06/02/11 0100  HGB 14.1  HCT 38.6*  PLT 163  APTT --  LABPROT --  INR --  HEPARINUNFRC --  CREATININE 1.09  CKTOTAL --  CKMB --  TROPONINI <0.30    Estimated Creatinine Clearance: 67.4 ml/min (by C-G formula based on Cr of 1.09).   Medical History: Past Medical History  Diagnosis Date  . Coronary artery disease   . Diabetes mellitus   . Hypertension   . Myocardial infarction     Medications: see med rec   Assessment:  61 yo man to start heparin for ACS/STEMI after presenting with SOB and chest pain and pain in left arm.  PMH significant for CAD, DM, HTN, MI, CABG, Stent. Goal of Therapy:  Heparin level 0.3-0.7 units/ml Monitor platelets by anticoagulation protocol: Yes   Plan:  Give 4000 units bolus x 1 Start heparin infusion at 1150 units/hr Check anti-Xa level in 6 hours and daily while on heparin Continue to monitor H&H and platelets  Herby Abraham, Pharm.D. [phone] 06/02/2011 5:26 AM

## 2011-06-02 NOTE — ED Notes (Signed)
REPORT GIVEN TO ADRIAN RN , TRANSPORTED TO FLOOR IN STABLE CONDITION , NO CHEST PAIN / RESPIRATIONS UNLABORED / IV HEPARIN DRIP INFUSING .

## 2011-06-02 NOTE — ED Provider Notes (Signed)
History     CSN: [id number]  Arrival date & time 06/02/11  0029   First MD Initiated Contact with Patient 06/02/11 0049      Chief Complaint  Patient presents with  . Elbow Pain    (Consider location/radiation/quality/duration/timing/severity/associated sxs/prior treatment) Patient is a 61 y.o. male presenting with chest pain. The history is provided by the patient (pt had sob and chest pain in left arm). No language interpreter was used.  Chest Pain The chest pain began less than 1 hour ago. Chest pain occurs intermittently. The chest pain is unchanged. The pain is associated with stress. At its most intense, the pain is at 6/10. The pain is currently at 2/10. The severity of the pain is moderate. The quality of the pain is described as aching. The pain does not radiate. Chest pain is worsened by stress. Pertinent negatives for primary symptoms include no fever, no fatigue, no cough and no abdominal pain.  Pertinent negatives for past medical history include no seizures.     Past Medical History  Diagnosis Date  . Coronary artery disease   . Diabetes mellitus   . Hypertension   . Myocardial infarction     Past Surgical History  Procedure Date  . Coronary artery bypass graft   . Coronary angioplasty with stent placement     No family history on file.  History  Substance Use Topics  . Smoking status: Former Games developer  . Smokeless tobacco: Not on file  . Alcohol Use: Yes      Review of Systems  Constitutional: Negative for fever and fatigue.  HENT: Negative for congestion, sinus pressure and ear discharge.   Eyes: Negative for discharge.  Respiratory: Negative for cough.   Cardiovascular: Positive for chest pain.  Gastrointestinal: Negative for abdominal pain and diarrhea.  Genitourinary: Negative for frequency and hematuria.  Musculoskeletal: Negative for back pain.  Skin: Negative for rash.  Neurological: Negative for seizures and headaches.  Hematological:  Negative.   Psychiatric/Behavioral: Negative for hallucinations.    Allergies  Avandia and Codeine  Home Medications   Current Outpatient Rx  Name Route Sig Dispense Refill  . ALPRAZOLAM 0.25 MG PO TABS Oral Take 0.25 mg by mouth 3 (three) times daily as needed. For anxiety     . ASPIRIN 325 MG PO TABS Oral Take 325 mg by mouth daily.      . ATORVASTATIN CALCIUM 40 MG PO TABS Oral Take 40 mg by mouth daily.    Marland Kitchen VITAMIN D 1000 UNITS PO TABS Oral Take 1,000 Units by mouth daily.    Marland Kitchen CLOPIDOGREL BISULFATE 75 MG PO TABS Oral Take 75 mg by mouth daily.      Marland Kitchen DILTIAZEM HCL ER BEADS 420 MG PO CP24 Oral Take 420 mg by mouth daily.      Marland Kitchen FLUTICASONE PROPIONATE 50 MCG/ACT NA SUSP Nasal Place 2 sprays into the nose daily as needed. For nasal congestion      . ISOSORBIDE MONONITRATE ER 120 MG PO TB24 Oral Take 120 mg by mouth daily.     Marland Kitchen METFORMIN HCL 1000 MG PO TABS Oral Take 1,000 mg by mouth 2 (two) times daily with a meal.     . METOPROLOL SUCCINATE ER 50 MG PO TB24 Oral Take 50 mg by mouth 2 (two) times daily.     Marland Kitchen NITROGLYCERIN 0.4 MG SL SUBL Sublingual Place 0.4 mg under the tongue every 5 (five) minutes x 3 doses as needed. For chest pain    .  QUINAPRIL-HYDROCHLOROTHIAZIDE 20-12.5 MG PO TABS Oral Take 1 tablet by mouth daily.      . TRAMADOL HCL 50 MG PO TABS Oral Take 50 mg by mouth every 6 (six) hours as needed. For pain.      BP 129/64  Pulse 67  Temp(Src) 98.6 F (37 C) (Oral)  Resp 13  SpO2 98%  Physical Exam  Constitutional: He is oriented to person, place, and time. He appears well-developed.  HENT:  Head: Normocephalic and atraumatic.  Eyes: Conjunctivae and EOM are normal. No scleral icterus.  Neck: Neck supple. No thyromegaly present.  Cardiovascular: Normal rate and regular rhythm.  Exam reveals no gallop and no friction rub.   No murmur heard. Pulmonary/Chest: No stridor. He has no wheezes. He has no rales. He exhibits no tenderness.  Abdominal: He exhibits no  distension. There is no tenderness. There is no rebound.  Musculoskeletal: Normal range of motion. He exhibits no edema.  Lymphadenopathy:    He has no cervical adenopathy.  Neurological: He is oriented to person, place, and time. Coordination normal.  Skin: No rash noted. No erythema.  Psychiatric: He has a normal mood and affect. His behavior is normal.    ED Course  Procedures (including critical care time)  Labs Reviewed  CBC - Abnormal; Notable for the following:    HCT 38.6 (*)    MCHC 36.5 (*)    All other components within normal limits  COMPREHENSIVE METABOLIC PANEL - Abnormal; Notable for the following:    Sodium 128 (*)    Chloride 93 (*)    Glucose, Bld 134 (*)    GFR calc non Af Amer 70 (*)    GFR calc Af Amer 82 (*)    All other components within normal limits  DIFFERENTIAL  TROPONIN I   Dg Chest Portable 1 View  06/02/2011  *RADIOLOGY REPORT*  Clinical Data: Mid chest pain and shortness of breath.  PORTABLE CHEST - 1 VIEW  Comparison: 01/04/2011  Findings: Stable appearance of mediastinal postoperative changes. Borderline heart size with normal pulmonary vascularity.  No focal airspace consolidation in the lungs.  No blunting of costophrenic angles.  No pneumothorax.  Calcification of the aorta.  No significant changes since previous study.  IMPRESSION: No evidence of active pulmonary disease.  Original Report Authenticated By: Marlon Pel, M.D.     1. Chest pain      Date: 06/02/2011  Rate:71  Rhythm: normal sinus rhythm  QRS Axis: normal  Intervals: normal  ST/T Wave abnormalities: normal  Conduction Disutrbances:none  Narrative Interpretation:   Old EKG Reviewed: unchanged    MDM          Benny Lennert, MD 06/02/11 [phone]

## 2011-06-02 NOTE — H&P (Signed)
[name] [name] is an 61 y.o. male.   PCP - Dr.John Valentina Lucks. Cardiologist - Dr.Smith. Chief Complaint: Chest discomfort. HPI: 61 year-old male with known history of CAD status post CABG status post stenting and had her last cardiac catheter last year and had been admitted previously this year in January 2013 chest pain and was medically managed at that time presented with complaints of having sudden onset of shortness of breath and he felt like his heart was pounding but slow. This happened while he was about to go to the bed. This lasted for a few minutes and he called the EMS and was brought to the ER. In the ER he developed some chest discomfort running across the chest. This improved with nitroglycerin sublingual. He also was given morphine. Presently chest pain-free. His EKG cardiac enzymes and chest x-ray has been negative for anything acute. Patient will be admitted for further workup. Patient denies any nausea vomiting abdominal pain diaphoresis.  Past Medical History  Diagnosis Date  . Coronary artery disease   . Diabetes mellitus   . Hypertension   . Myocardial infarction     Past Surgical History  Procedure Date  . Coronary artery bypass graft   . Coronary angioplasty with stent placement     Family History  Problem Relation Age of Onset  . Anesthesia problems Brother    Social History:  reports that he has quit smoking. He does not have any smokeless tobacco history on file. He reports that he drinks alcohol. He reports that he does not use illicit drugs.  Allergies:  Allergies  Allergen Reactions  . Avandia (Rosiglitazone) Other (See Comments)    Weight gain  . Codeine Itching, Swelling and Other (See Comments)    Burning and heart flutter dizziness     (Not in a hospital admission)  Results for orders placed during the hospital encounter of 06/02/11 (from the past 48 hour(s))  CBC     Status: Abnormal   Collection Time   06/02/11  1:00 AM      Component Value  Range Comment   WBC 8.5  4.0 - 10.5 (K/uL)    RBC 4.54  4.22 - 5.81 (MIL/uL)    Hemoglobin 14.1  13.0 - 17.0 (g/dL)    HCT 29.5 (*) 62.1 - 52.0 (%)    MCV 85.0  78.0 - 100.0 (fL)    MCH 31.1  26.0 - 34.0 (pg)    MCHC 36.5 (*) 30.0 - 36.0 (g/dL)    RDW 30.8  65.7 - 84.6 (%)    Platelets 163  150 - 400 (K/uL)   DIFFERENTIAL     Status: Normal   Collection Time   06/02/11  1:00 AM      Component Value Range Comment   Neutrophils Relative 68  43 - 77 (%)    Neutro Abs 5.8  1.7 - 7.7 (K/uL)    Lymphocytes Relative 21  12 - 46 (%)    Lymphs Abs 1.8  0.7 - 4.0 (K/uL)    Monocytes Relative 8  3 - 12 (%)    Monocytes Absolute 0.7  0.1 - 1.0 (K/uL)    Eosinophils Relative 3  0 - 5 (%)    Eosinophils Absolute 0.2  0.0 - 0.7 (K/uL)    Basophils Relative 1  0 - 1 (%)    Basophils Absolute 0.0  0.0 - 0.1 (K/uL)   TROPONIN I     Status: Normal   Collection Time   06/02/11  1:00 AM      Component Value Range Comment   Troponin I <0.30  <0.30 (ng/mL)   COMPREHENSIVE METABOLIC PANEL     Status: Abnormal   Collection Time   06/02/11  1:00 AM      Component Value Range Comment   Sodium 128 (*) 135 - 145 (mEq/L)    Potassium 4.3  3.5 - 5.1 (mEq/L)    Chloride 93 (*) 96 - 112 (mEq/L)    CO2 23  19 - 32 (mEq/L)    Glucose, Bld 134 (*) 70 - 99 (mg/dL)    BUN 16  6 - 23 (mg/dL)    Creatinine, Ser 1.61  0.50 - 1.35 (mg/dL)    Calcium 9.4  8.4 - 10.5 (mg/dL)    Total Protein 7.2  6.0 - 8.3 (g/dL)    Albumin 4.0  3.5 - 5.2 (g/dL)    AST 20  0 - 37 (U/L)    ALT 21  0 - 53 (U/L)    Alkaline Phosphatase 84  39 - 117 (U/L)    Total Bilirubin 0.3  0.3 - 1.2 (mg/dL)    GFR calc non Af Amer 70 (*) >90 (mL/min)    GFR calc Af Amer 82 (*) >90 (mL/min)    Dg Chest Portable 1 View  06/02/2011  *RADIOLOGY REPORT*  Clinical Data: Mid chest pain and shortness of breath.  PORTABLE CHEST - 1 VIEW  Comparison: 01/04/2011  Findings: Stable appearance of mediastinal postoperative changes. Borderline heart size with  normal pulmonary vascularity.  No focal airspace consolidation in the lungs.  No blunting of costophrenic angles.  No pneumothorax.  Calcification of the aorta.  No significant changes since previous study.  IMPRESSION: No evidence of active pulmonary disease.  Original Report Authenticated By: Marlon Pel, M.D.    Review of Systems  Constitutional: Negative.   HENT: Negative.   Eyes: Negative.   Respiratory: Positive for shortness of breath.   Cardiovascular: Positive for chest pain.  Gastrointestinal: Negative.   Genitourinary: Negative.   Musculoskeletal: Negative.   Skin: Negative.   Neurological: Negative.   Endo/Heme/Allergies: Negative.   Psychiatric/Behavioral: Negative.     Blood pressure 119/64, pulse 55, temperature 98.6 F (37 C), temperature source Oral, resp. rate 14, height 5' 8.11" (1.73 m), weight 80.8 kg (178 lb 2.1 oz), SpO2 97.00%. Physical Exam  Constitutional: He is oriented to person, place, and time. He appears well-developed and well-nourished. No distress.  HENT:  Head: Normocephalic and atraumatic.  Right Ear: External ear normal.  Left Ear: External ear normal.  Nose: Nose normal.  Mouth/Throat: Oropharynx is clear and moist. No oropharyngeal exudate.  Eyes: Conjunctivae are normal. Pupils are equal, round, and reactive to light. Right eye exhibits no discharge. Left eye exhibits no discharge. No scleral icterus.  Neck: Normal range of motion. Neck supple.  Cardiovascular: Normal rate and regular rhythm.   Respiratory: Effort normal and breath sounds normal. No respiratory distress. He has no wheezes. He has no rales.  GI: Soft. Bowel sounds are normal. He exhibits no distension. There is no tenderness. There is no rebound.  Musculoskeletal: Normal range of motion. He exhibits no edema and no tenderness.  Neurological: He is alert and oriented to person, place, and time.       Moves all extremities.  Skin: Skin is warm and dry. No rash noted. He  is not diaphoretic. No erythema.  Psychiatric: His behavior is normal.     Assessment/Plan #1. Chest pain  with history of CAD status post CABG and stenting at this time concerning for unstable angina - patient is chest pain-free. Has been started on IV heparin infusion. We will cycle cardiac markers. Patient is already on aspirin and Plavix which we'll continue. Consult cardiology in a.m. #2. Mild hyponatremia - patient did have hyponatremia in this January also. We will discontinue the HCTZ and follow metabolic panel. #3. Diabetes mellitus2 - hold metformin in anticipation of possible procedure. Place patient on sliding-scale coverage. #4. Hypertension - continue present medications. #5. Hyperlipidemia - continue present medications.  CODE STATUS - full code.  Zowie Lundahl N. 06/02/2011, 5:31 AM

## 2011-06-02 NOTE — ED Notes (Signed)
PT. ARRIVED WITH EMS REPORTS PAIN AT LEFT ELBOW  ONSET THIS EVENING , DENIES INJURY , PAIN RELIEVED BY OXYGEN / WORSE WITH MOVEMENT, STATES HISTORY OF MI WITH CABG. STATES MILD MID CHEST DISCOMFORT AT ARRIVAL WITH SOB.

## 2011-06-02 NOTE — Progress Notes (Signed)
ANTICOAGULATION CONSULT NOTE - Follow Up Consult  Pharmacy Consult for Heparin Indication: chest pain/ACS  Allergies  Allergen Reactions  . Avandia (Rosiglitazone) Other (See Comments)    Weight gain  . Codeine Itching, Swelling and Other (See Comments)    Burning and heart flutter dizziness    Patient Measurements: Height: 5' 8.11" (173 cm) Weight: 185 lb 6.5 oz (84.1 kg) IBW/kg (Calculated) : 68.65  Heparin Dosing Weight: 84kg  Vital Signs: Temp: 97.5 F (36.4 C) (05/30 1339) Temp src: Tympanic (05/30 1339) BP: 114/61 mmHg (05/30 1339) Pulse Rate: 50  (05/30 1339)  Labs:  Basename 06/02/11 1307 06/02/11 1300 06/02/11 0705 06/02/11 0100  HGB -- -- 13.5 14.1  HCT -- -- 38.2* 38.6*  PLT -- -- 159 163  APTT -- -- -- --  LABPROT -- -- -- --  INR -- -- -- --  HEPARINUNFRC -- 0.76* -- --  CREATININE -- -- 0.86 1.09  CKTOTAL 89 -- 99 --  CKMB 2.0 -- 2.0 --  TROPONINI <0.30 -- <0.30 <0.30    Estimated Creatinine Clearance: 93.1 ml/min (by C-G formula based on Cr of 0.86).   Medications:  Heparin 1150 units/hr  Assessment: 61yom on heparin for CP/USA. Heparin level (0.76) is supratherapeutic. H/H and plts wnl, no bleeding reported. Noted orders to lock IV so contacted Dr. Katrinka Blazing - received verbal orders to discontinue heparin drip.   Plan:  1. Discontinue heparin drip, levels and protocol per MD orders  [name] [name] [phone] 06/02/2011,3:56 PM

## 2012-10-04 ENCOUNTER — Encounter: Payer: Self-pay | Admitting: *Deleted

## 2012-10-04 ENCOUNTER — Other Ambulatory Visit: Payer: Self-pay | Admitting: Cardiology

## 2012-10-04 ENCOUNTER — Encounter (INDEPENDENT_AMBULATORY_CARE_PROVIDER_SITE_OTHER): Payer: BC Managed Care – PPO

## 2012-10-04 DIAGNOSIS — R002 Palpitations: Secondary | ICD-10-CM

## 2012-10-04 DIAGNOSIS — R42 Dizziness and giddiness: Secondary | ICD-10-CM

## 2012-10-04 NOTE — Progress Notes (Signed)
Patient ID: [name] [name], male   DOB: 28-Oct-1950, 62 y.o.   MRN: [id number] E-Cardio Verite 30 day cardiac event monitor applied to patient.

## 2012-10-18 ENCOUNTER — Other Ambulatory Visit: Payer: Self-pay | Admitting: Interventional Cardiology

## 2012-11-07 ENCOUNTER — Telehealth: Payer: Self-pay

## 2012-11-07 NOTE — Telephone Encounter (Signed)
pt notified of monitor results.pt monitor normal.pt verbalized understanding

## 2013-03-24 ENCOUNTER — Other Ambulatory Visit: Payer: Self-pay | Admitting: Interventional Cardiology

## 2013-04-18 ENCOUNTER — Other Ambulatory Visit: Payer: Self-pay | Admitting: Interventional Cardiology

## 2013-05-02 ENCOUNTER — Telehealth: Payer: Self-pay

## 2013-05-02 NOTE — Telephone Encounter (Signed)
Cardiac clearance form given to medical record to fax to [facility] attn:Wendy [phone]

## 2013-05-03 ENCOUNTER — Telehealth: Payer: Self-pay | Admitting: Interventional Cardiology

## 2013-05-03 NOTE — Telephone Encounter (Signed)
Received request from Nurse fax box, documents faxed for surgical clearance. To: [employer] Fax number: [phone] Attention: [phone]/[phone]

## 2013-05-16 IMAGING — CR DG CHEST 1V PORT
1 series · 1 of 1 positions shown · non-contrast
Comparison: 03/07/2010

CLINICAL DATA: Chest pain

PORTABLE CHEST - 1 VIEW

[AP]
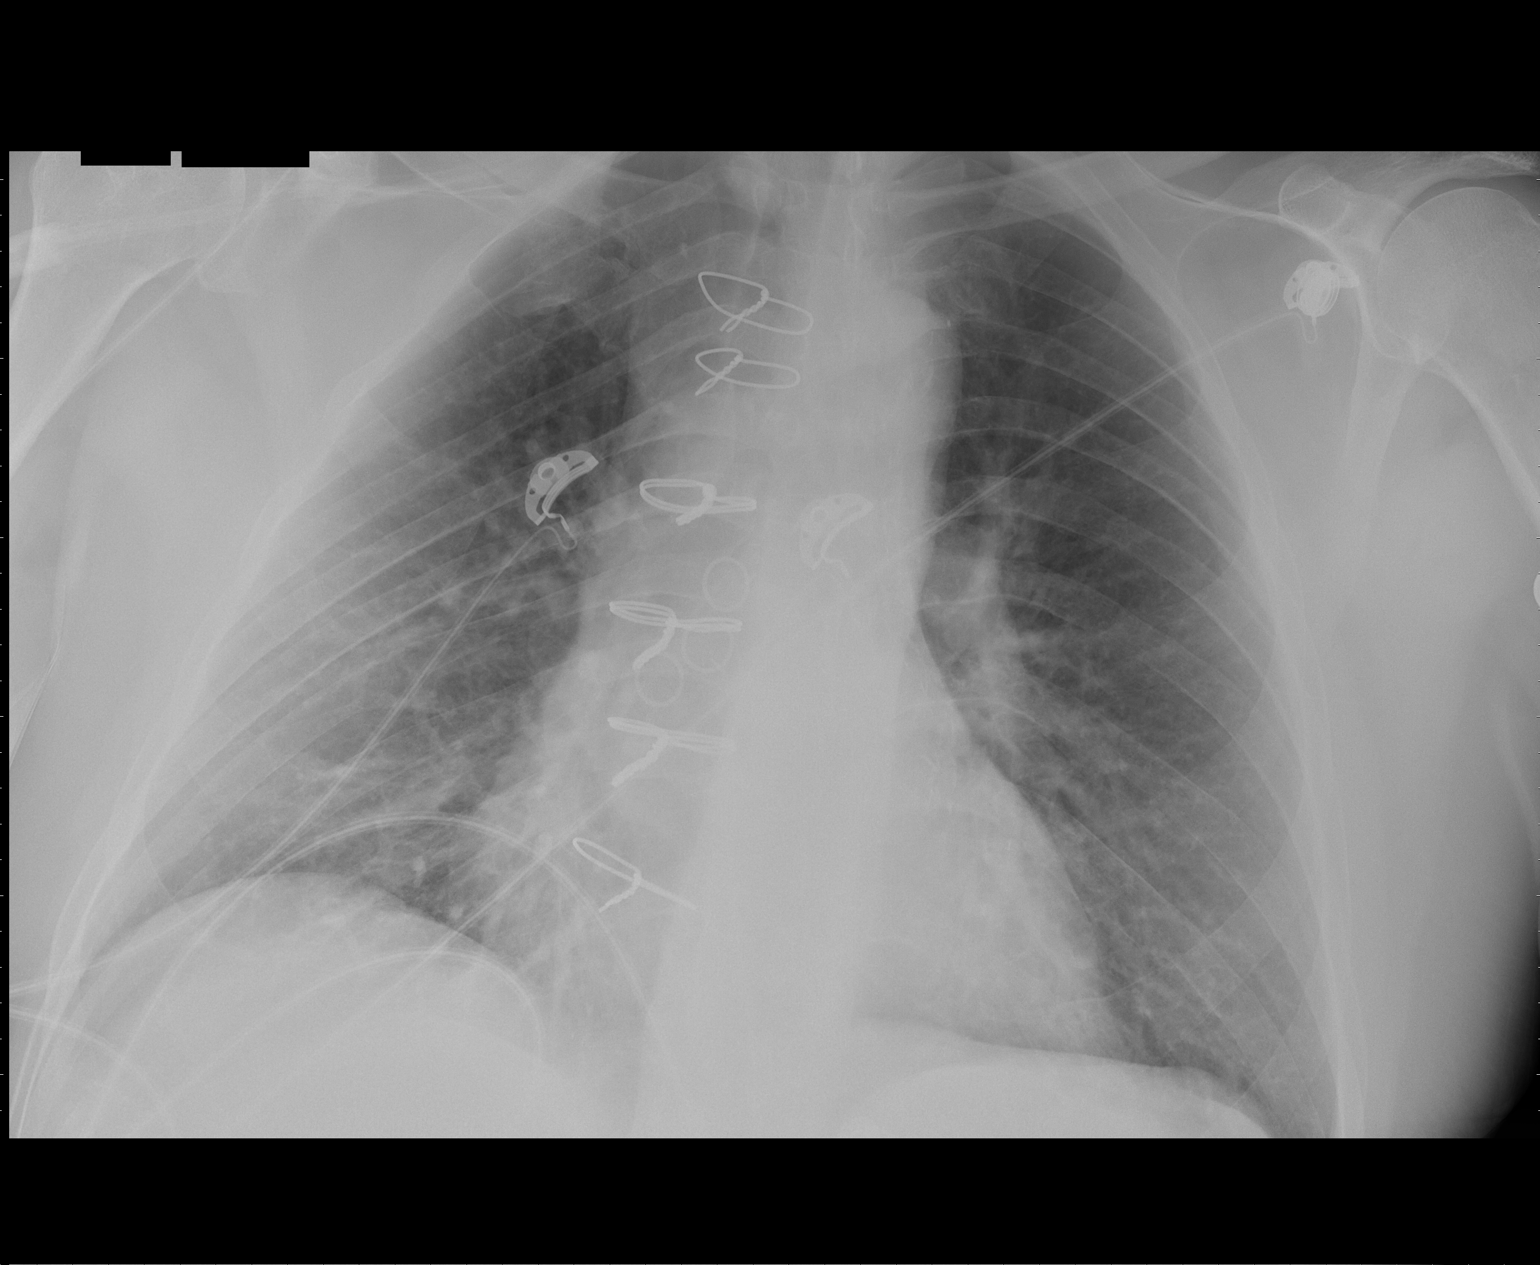

[1 of 1 positions shown; findings below may reference images not displayed]

FINDINGS: Heart size is normal.

No pleural effusion or pulmonary edema

No airspace consolidation identified.

Review of the visualized osseous structures is unremarkable.
IMPRESSION: 1.  No active cardiopulmonary abnormalities.

## 2013-05-17 NOTE — Progress Notes (Addendum)
Anesthesia note: Patient is a 63 year old male scheduled for left shoulder arthroscopy, subacromial decompression, distal clavicle resection, and rotator cuff repair on 05/23/2013 by Dr. Onnie Graham. PAT appointment is scheduled for 05/20/13.  History includes CAD with inferolateral non-Q wave MI '89 with occluded CX, s/p CABG (LIMA to LAD, SVG to DIAG, SVG to RAMUS, SVG to PDA, free RA to OM2) 04/25/08, 06/23/12 DES to [place], NSTEMI 08/04/10, hyperlipidemia, hypertension, diabetes mellitus type 2, former smoker.  Cardiologist is Dr. Daneen Schick who cleared patient for this procedure.  Last visit was in 09/2012. Patient has known stable angina. PCP is Dr. [name].  EKG on 10/01/12 [facility]) showed: SB @ 45 bpm.  30 day event monitor from 10/2012 showed: No significant brady or tachy arrhythmias even during symptoms.  Cardiac catheterization on 08/04/10 showed: Normal LV cavity size and function are normal. 40-50% distal LM. Left anterior descending coronary artery: Heavily calcified LAD with competitive flow in the midvessel due to patent LIMA. A large diagonal that was recently stented by Dr. Irish Lack is patent. There is a mid stent 60-70% narrowing and a significant region of tortuosity/kinking at the distal stent margin. TIMI flow however is grade. There is a proximal edge dissection noted. 30-50 % proximal ramus intermedius. Occluded proximal CX. Occluded distal RCA. The conus branch supplies collaterals and the right atrial recurrent branch supplies collaterals to the distal circumflex. Bypass graft angiography. SVG to RCA/PDA: Patent with proximal 50% narrowing. Free radial graft to the distal circumflex/second obtuse marginal widely patent but with a distal anastomoses/obtuse marginal #2 greater than 90% stenosis. The man-made Y saphenous vein graft to the ramus intermedius is occluded. SVG to D1 is totally occluded. Widely patent LIMA to LAD. PTCA of the obtuse marginal #2 via the free radial graft technically  successful but clinically failed due to inability to dilate the lesion at high pressure greater than 20 atmospheres. We therefore did not stent the lesion because of our inability to expand the artery appropriately.  CONCLUSIONS: 1. Recurrent unstable angina/non-ST-elevation MI due to marginal disease beyond the graft insertion site and possibly some ischemia caused by the diagonal.  2. Bypass graft occlusion with 100% stenosis of the saphenous vein graft to the ramus and 100% of the graft to the diagonal.  3. Patent recently tented diagonal #1 but with 2 regions of in-stent narrowing of up to 60-70%.  4. Normal LV function.  5. Severe native vessel coronary artery disease with occlusion of the distal right coronary artery, the proximal circumflex, and high-grade obstruction of the distal circumflex/obtuse marginal.  6. The patient will be at high risk for occlusion of this graft at segment due to inability to dilate.  Additional preoperative testing at his PAT appointment on 05/20/13.  [facility] Short Stay Center/Anesthesiology Phone [phone] 05/17/2013 6:23 PM  Addendum: 05/20/2013 4:05 PM Patient had his PAT appointment this morning.  I had planned to speak with him, but he left following his labs/CXR.  I did call him this afternoon.  He reports continued stable angina without progressive or worsening symptoms for at least a year.  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  On average, he will notice chest pressure/achiness about 3-4 X /month.  Most of the time if it occurs, it will occur later in the evening when he is out feeding his animals.  He will take one Nitro and rest for a few minutes then go  about his activities.  There are no associated symptoms such as diaphoresis or nausea. He has had no new SOB, dizziness, palpitations, or pre-syncope since Dr. Tamala Julian adjusted his cardiac medications due to bradycardia  approximately one year ago. He hurt his right knee last year and will get occasional mild right ankle edema with prolonged standing.  His fasting glucose runs ~ 130.  He reports that Dr. Tamala Julian did not want his to stop ASA or Plavix, so he is still on these medications.  Patient said Dr. Onnie Graham was aware of this. I called and spoke with Abigail Butts at Dr. Susie Cassette office, and she confirmed that Dr. Onnie Graham was aware.  His labs and CXR from today appear acceptable for OR.    I have updated anesthesiologist Dr. Sherren Kerns regarding patient's CAD and stable angina symptoms, cardiac clearance, and that Plavix and ASA are not being held for surgery.  Patient will be further evaluated by his assigned anesthesiologist on the day of surgery to ensure no acute changes and determine if a nerve block will utilized since he is on anti-platelet therapy.

## 2013-05-20 ENCOUNTER — Encounter (HOSPITAL_COMMUNITY)
Admission: RE | Admit: 2013-05-20 | Discharge: 2013-05-20 | Disposition: A | Discharge status: Home / Self Care | Payer: Worker's Compensation | Source: Ambulatory Visit | Attending: Orthopedic Surgery | Admitting: Orthopedic Surgery

## 2013-05-20 ENCOUNTER — Other Ambulatory Visit (HOSPITAL_COMMUNITY): Payer: Self-pay | Admitting: *Deleted

## 2013-05-20 ENCOUNTER — Encounter (HOSPITAL_COMMUNITY): Payer: Self-pay

## 2013-05-20 ENCOUNTER — Encounter (HOSPITAL_COMMUNITY)
Admission: RE | Admit: 2013-05-20 | Discharge: 2013-05-20 | Disposition: A | Discharge status: Home / Self Care | Payer: Worker's Compensation | Source: Ambulatory Visit | Attending: Anesthesiology | Admitting: Anesthesiology

## 2013-05-20 DIAGNOSIS — Z01818 Encounter for other preprocedural examination: Secondary | ICD-10-CM | POA: Insufficient documentation

## 2013-05-20 DIAGNOSIS — Z951 Presence of aortocoronary bypass graft: Secondary | ICD-10-CM | POA: Insufficient documentation

## 2013-05-20 DIAGNOSIS — Z01812 Encounter for preprocedural laboratory examination: Secondary | ICD-10-CM | POA: Insufficient documentation

## 2013-05-20 HISTORY — DX: Chest pain, unspecified: R07.9

## 2013-05-20 LAB — COMPREHENSIVE METABOLIC PANEL
ALK PHOS: 85 U/L (ref 39–117)
ALT: 18 U/L (ref 0–53)
AST: 21 U/L (ref 0–37)
Albumin: 4 g/dL (ref 3.5–5.2)
BILIRUBIN TOTAL: 0.5 mg/dL (ref 0.3–1.2)
BUN: 12 mg/dL (ref 6–23)
CHLORIDE: 96 meq/L (ref 96–112)
CO2: 21 meq/L (ref 19–32)
Calcium: 9.8 mg/dL (ref 8.4–10.5)
Creatinine, Ser: 0.95 mg/dL (ref 0.50–1.35)
GFR calc Af Amer: >90 mL/min (ref >90)
GFR, EST NON AFRICAN AMERICAN: 86 mL/min — AB (ref >90)
Glucose, Bld: 222 mg/dL — ABNORMAL HIGH (ref 70–99)
POTASSIUM: 4.4 meq/L (ref 3.7–5.3)
SODIUM: 133 meq/L — AB (ref 137–147)
Total Protein: 7.8 g/dL (ref 6.0–8.3)

## 2013-05-20 LAB — CBC WITH DIFFERENTIAL/PLATELET
BASOS ABS: 0 10*3/uL (ref 0.0–0.1)
BASOS PCT: 0 % (ref 0–1)
Eosinophils Absolute: 0.4 10*3/uL (ref 0.0–0.7)
Eosinophils Relative: 4 % (ref 0–5)
HCT: 44 % (ref 39.0–52.0)
Hemoglobin: 15.1 g/dL (ref 13.0–17.0)
LYMPHS PCT: 36 % (ref 12–46)
Lymphs Abs: 3.2 10*3/uL (ref 0.7–4.0)
MCH: 30 pg (ref 26.0–34.0)
MCHC: 34.3 g/dL (ref 30.0–36.0)
MCV: 87.5 fL (ref 78.0–100.0)
Monocytes Absolute: 0.7 10*3/uL (ref 0.1–1.0)
Monocytes Relative: 8 % (ref 3–12)
NEUTROS ABS: 4.6 10*3/uL (ref 1.7–7.7)
NEUTROS PCT: 52 % (ref 43–77)
PLATELETS: 156 10*3/uL (ref 150–400)
RBC: 5.03 MIL/uL (ref 4.22–5.81)
RDW: 13 % (ref 11.5–15.5)
WBC: 9 10*3/uL (ref 4.0–10.5)

## 2013-05-20 LAB — APTT: aPTT: 27 seconds (ref 24–37)

## 2013-05-20 LAB — PROTIME-INR
INR: 1.01 (ref 0.00–1.49)
Prothrombin Time: 13.1 seconds (ref 11.6–15.2)

## 2013-05-20 NOTE — Pre-Procedure Instructions (Signed)
[name] [name]  05/20/2013   Your procedure is scheduled on:  Thursday, May 23, 2013 at 10:00 AM.   Report to [facility] Entrance "A" Admitting Office at 8:00 AM.   Call this number if you have problems the morning of surgery: [phone]   Remember:   Do not eat food or drink liquids after midnight Wednesday, 05/22/13.   Take these medicines the morning of surgery with A SIP OF WATER: diltiazem (TIAZAC), isosorbide mononitrate (IMDUR),  metoprolol succinate (TOPROL-XL), traMADol (ULTRAM) - if needed, nitroGLYCERIN (NITROSTAT) - if needed, ALPRAZolam Duanne Moron) - if needed, fluticasone (FLONASE) - if needed.  Stop Vitamins, Plavix and Aspirin as of today.  Do not take any of your diabetic medications day of surgery. Continue to take all your other medicines as you normally do until day of surgery and then follow above instructions.     Do not wear jewelry.  Do not wear lotions, powders, or cologne. You may NOT wear deodorant.  Men may shave face and neck.  Do not bring valuables to the hospital.  [facility] is not responsible                  for any belongings or valuables.               Contacts, dentures or bridgework may not be worn into surgery.  Leave suitcase in the car. After surgery it may be brought to your room.  For patients admitted to the hospital, discharge time is determined by your                treatment team.               Patients discharged the day of surgery will not be allowed to drive  home.    Special Instructions: [place] - Preparing for Surgery  Before surgery, you can play an important role.  Because skin is not sterile, your skin needs to be as free of germs as possible.  You can reduce the number of germs on you skin by washing with CHG (chlorahexidine gluconate) soap before surgery.  CHG is an antiseptic cleaner which kills germs and bonds with the skin to continue killing germs even after washing.  Please DO NOT use if you have an allergy  to CHG or antibacterial soaps.  If your skin becomes reddened/irritated stop using the CHG and inform your nurse when you arrive at Short Stay.  Do not shave (including legs and underarms) for at least 48 hours prior to the first CHG shower.  You may shave your face.  Please follow these instructions carefully:   1.  Shower with CHG Soap the night before surgery and the                                morning of Surgery.  2.  If you choose to wash your hair, wash your hair first as usual with your       normal shampoo.  3.  After you shampoo, rinse your hair and body thoroughly to remove the                      Shampoo.  4.  Use CHG as you would any other liquid soap.  You can apply chg directly       to the skin and wash gently with scrungie or a clean washcloth.  5.  Apply the CHG Soap to your body ONLY FROM THE NECK DOWN.        Do not use on open wounds or open sores.  Avoid contact with your eyes, ears, mouth and genitals (private parts).  Wash genitals (private parts) with your normal soap.  6.  Wash thoroughly, paying special attention to the area where your surgery        will be performed.  7.  Thoroughly rinse your body with warm water from the neck down.  8.  DO NOT shower/wash with your normal soap after using and rinsing off       the CHG Soap.  9.  Pat yourself dry with a clean towel.            10.  Wear clean pajamas.            11.  Place clean sheets on your bed the night of your first shower and do not        sleep with pets.  Day of Surgery  Do not apply any lotions/deodorants the morning of surgery.  Please wear clean clothes to the hospital/surgery center.     Please read over the following fact sheets that you were given: Pain Booklet, Coughing and Deep Breathing and Surgical Site Infection Prevention

## 2013-05-20 NOTE — Progress Notes (Signed)
   PCP is Dr. [name] Cardiologist is Dr. Daneen Schick with [facility].  Pt reports that he has chest pain at least weekly and usually takes 1 SL NTG with relief. Last episode was yesterday. Pt denies that chest pain was new or different. Pt reports that Dr. Tamala Julian is aware of frequent chest pain and that pt was informed that due to 2 failed bypass grafts after CABG that he could expect chest pain to occur. Pt reports that he was instructed to call 911 if 3 SL NTG do not relieve chest pain.

## 2013-05-20 NOTE — Progress Notes (Signed)
05/20/13 1037  OBSTRUCTIVE SLEEP APNEA  Have you ever been diagnosed with sleep apnea through a sleep study? No  Do you snore loudly (loud enough to be heard through closed doors)?  1  Do you often feel tired, fatigued, or sleepy during the daytime? 0  Has anyone observed you stop breathing during your sleep? 0  Do you have, or are you being treated for high blood pressure? 1  BMI more than 35 kg/m2? 0  Age over 65 years old? 1  Neck circumference greater than 40 cm/16 inches? 0  Gender: 1  Obstructive Sleep Apnea Score 4

## 2013-05-22 MED ORDER — CHLORHEXIDINE GLUCONATE 4 % EX LIQD
60.0000 mL | Freq: Once | CUTANEOUS | Status: DC
  Filled 2013-05-22: qty 60

## 2013-05-22 MED ORDER — CEFAZOLIN SODIUM-DEXTROSE 2-3 GM-% IV SOLR
2.0000 g | INTRAVENOUS | Status: AC
  Administered 2013-05-23: 2 g via INTRAVENOUS
  Filled 2013-05-22: qty 50

## 2013-05-23 ENCOUNTER — Encounter (HOSPITAL_COMMUNITY): Payer: Self-pay | Admitting: *Deleted

## 2013-05-23 ENCOUNTER — Encounter (HOSPITAL_COMMUNITY): Payer: Worker's Compensation | Admitting: Vascular Surgery

## 2013-05-23 ENCOUNTER — Ambulatory Visit (HOSPITAL_COMMUNITY)
Admission: RE | Admit: 2013-05-23 | Discharge: 2013-05-23 | Disposition: A | Discharge status: Home / Self Care | Payer: Worker's Compensation | Source: Ambulatory Visit | Attending: Orthopedic Surgery | Admitting: Orthopedic Surgery

## 2013-05-23 ENCOUNTER — Ambulatory Visit (HOSPITAL_COMMUNITY): Payer: Worker's Compensation | Admitting: Anesthesiology

## 2013-05-23 ENCOUNTER — Encounter (HOSPITAL_COMMUNITY)
Admission: RE | Disposition: A | Discharge status: Home / Self Care | Payer: Self-pay | Source: Ambulatory Visit | Attending: Orthopedic Surgery

## 2013-05-23 DIAGNOSIS — Z951 Presence of aortocoronary bypass graft: Secondary | ICD-10-CM | POA: Diagnosis not present

## 2013-05-23 DIAGNOSIS — I10 Essential (primary) hypertension: Secondary | ICD-10-CM | POA: Diagnosis not present

## 2013-05-23 DIAGNOSIS — M19019 Primary osteoarthritis, unspecified shoulder: Secondary | ICD-10-CM | POA: Diagnosis not present

## 2013-05-23 DIAGNOSIS — Z885 Allergy status to narcotic agent status: Secondary | ICD-10-CM | POA: Insufficient documentation

## 2013-05-23 DIAGNOSIS — M25819 Other specified joint disorders, unspecified shoulder: Secondary | ICD-10-CM | POA: Insufficient documentation

## 2013-05-23 DIAGNOSIS — S43429A Sprain of unspecified rotator cuff capsule, initial encounter: Secondary | ICD-10-CM | POA: Insufficient documentation

## 2013-05-23 DIAGNOSIS — Z87891 Personal history of nicotine dependence: Secondary | ICD-10-CM | POA: Diagnosis not present

## 2013-05-23 DIAGNOSIS — X58XXXA Exposure to other specified factors, initial encounter: Secondary | ICD-10-CM | POA: Diagnosis not present

## 2013-05-23 DIAGNOSIS — M7530 Calcific tendinitis of unspecified shoulder: Secondary | ICD-10-CM | POA: Insufficient documentation

## 2013-05-23 DIAGNOSIS — E119 Type 2 diabetes mellitus without complications: Secondary | ICD-10-CM | POA: Insufficient documentation

## 2013-05-23 DIAGNOSIS — Z9861 Coronary angioplasty status: Secondary | ICD-10-CM | POA: Insufficient documentation

## 2013-05-23 DIAGNOSIS — Z888 Allergy status to other drugs, medicaments and biological substances status: Secondary | ICD-10-CM | POA: Insufficient documentation

## 2013-05-23 DIAGNOSIS — Y9289 Other specified places as the place of occurrence of the external cause: Secondary | ICD-10-CM | POA: Insufficient documentation

## 2013-05-23 DIAGNOSIS — I252 Old myocardial infarction: Secondary | ICD-10-CM | POA: Insufficient documentation

## 2013-05-23 DIAGNOSIS — Z79899 Other long term (current) drug therapy: Secondary | ICD-10-CM | POA: Insufficient documentation

## 2013-05-23 DIAGNOSIS — I2510 Atherosclerotic heart disease of native coronary artery without angina pectoris: Secondary | ICD-10-CM | POA: Insufficient documentation

## 2013-05-23 DIAGNOSIS — Z7982 Long term (current) use of aspirin: Secondary | ICD-10-CM | POA: Insufficient documentation

## 2013-05-23 DIAGNOSIS — M7580 Other shoulder lesions, unspecified shoulder: Secondary | ICD-10-CM

## 2013-05-23 HISTORY — PX: SHOULDER ARTHROSCOPY WITH ROTATOR CUFF REPAIR AND SUBACROMIAL DECOMPRESSION: SHX5686

## 2013-05-23 LAB — GLUCOSE, CAPILLARY
GLUCOSE-CAPILLARY: 166 mg/dL — AB (ref 70–99)
Glucose-Capillary: 155 mg/dL — ABNORMAL HIGH (ref 70–99)

## 2013-05-23 SURGERY — SHOULDER ARTHROSCOPY WITH ROTATOR CUFF REPAIR AND SUBACROMIAL DECOMPRESSION
Anesthesia: Regional | Site: Shoulder | Laterality: Left

## 2013-05-23 MED ORDER — LACTATED RINGERS IV SOLN
INTRAVENOUS | Status: DC
  Administered 2013-05-23: 09:00:00 via INTRAVENOUS

## 2013-05-23 MED ORDER — GLYCOPYRROLATE 0.2 MG/ML IJ SOLN
INTRAMUSCULAR | Status: AC
  Filled 2013-05-23: qty 2

## 2013-05-23 MED ORDER — OXYCODONE-ACETAMINOPHEN 5-325 MG PO TABS: 1.0000 | ORAL_TABLET | ORAL | Status: DC | PRN

## 2013-05-23 MED ORDER — ONDANSETRON HCL 4 MG/2ML IJ SOLN
INTRAMUSCULAR | Status: DC | PRN
  Administered 2013-05-23: 4 mg via INTRAVENOUS

## 2013-05-23 MED ORDER — FENTANYL CITRATE 0.05 MG/ML IJ SOLN
INTRAMUSCULAR | Status: DC | PRN
  Administered 2013-05-23: 50 ug via INTRAVENOUS

## 2013-05-23 MED ORDER — FENTANYL CITRATE 0.05 MG/ML IJ SOLN
INTRAMUSCULAR | Status: AC
  Filled 2013-05-23: qty 2

## 2013-05-23 MED ORDER — ROCURONIUM BROMIDE 100 MG/10ML IV SOLN
INTRAVENOUS | Status: DC | PRN
  Administered 2013-05-23: 50 mg via INTRAVENOUS

## 2013-05-23 MED ORDER — OXYCODONE HCL 5 MG PO TABS: 5.0000 mg | ORAL_TABLET | Freq: Once | ORAL | Status: DC | PRN

## 2013-05-23 MED ORDER — ONDANSETRON HCL 4 MG/2ML IJ SOLN
INTRAMUSCULAR | Status: AC
  Filled 2013-05-23: qty 2

## 2013-05-23 MED ORDER — PROPOFOL 10 MG/ML IV BOLUS
INTRAVENOUS | Status: AC
  Filled 2013-05-23: qty 20

## 2013-05-23 MED ORDER — EPHEDRINE SULFATE 50 MG/ML IJ SOLN
INTRAMUSCULAR | Status: AC
  Filled 2013-05-23: qty 1

## 2013-05-23 MED ORDER — DIAZEPAM 5 MG PO TABS: 2.5000 mg | ORAL_TABLET | Freq: Four times a day (QID) | ORAL | Status: DC | PRN

## 2013-05-23 MED ORDER — PROPOFOL 10 MG/ML IV BOLUS
INTRAVENOUS | Status: DC | PRN
  Administered 2013-05-23: 180 mg via INTRAVENOUS

## 2013-05-23 MED ORDER — LIDOCAINE HCL (CARDIAC) 20 MG/ML IV SOLN
INTRAVENOUS | Status: AC
  Filled 2013-05-23: qty 5

## 2013-05-23 MED ORDER — ONDANSETRON HCL 4 MG/2ML IJ SOLN: 4.0000 mg | Freq: Four times a day (QID) | INTRAMUSCULAR | Status: DC | PRN

## 2013-05-23 MED ORDER — MIDAZOLAM HCL 5 MG/ML IJ SOLN: 1.0000 mg | Freq: Once | INTRAMUSCULAR | Status: DC

## 2013-05-23 MED ORDER — BUPIVACAINE-EPINEPHRINE (PF) 0.5% -1:200000 IJ SOLN
INTRAMUSCULAR | Status: DC | PRN
  Administered 2013-05-23: 30 mL

## 2013-05-23 MED ORDER — NEOSTIGMINE METHYLSULFATE 10 MG/10ML IV SOLN
INTRAVENOUS | Status: AC
  Filled 2013-05-23: qty 1

## 2013-05-23 MED ORDER — LACTATED RINGERS IV SOLN
INTRAVENOUS | Status: DC | PRN
  Administered 2013-05-23 (×2): via INTRAVENOUS

## 2013-05-23 MED ORDER — GLYCOPYRROLATE 0.2 MG/ML IJ SOLN
INTRAMUSCULAR | Status: DC | PRN
  Administered 2013-05-23: 0.4 mg via INTRAVENOUS

## 2013-05-23 MED ORDER — FENTANYL CITRATE 0.05 MG/ML IJ SOLN
INTRAMUSCULAR | Status: AC
  Filled 2013-05-23: qty 5

## 2013-05-23 MED ORDER — OXYCODONE HCL 5 MG/5ML PO SOLN: 5.0000 mg | Freq: Once | ORAL | Status: DC | PRN

## 2013-05-23 MED ORDER — FENTANYL CITRATE 0.05 MG/ML IJ SOLN
50.0000 ug | Freq: Once | INTRAMUSCULAR | Status: AC
  Administered 2013-05-23: 50 ug via INTRAVENOUS

## 2013-05-23 MED ORDER — SUCCINYLCHOLINE CHLORIDE 20 MG/ML IJ SOLN
INTRAMUSCULAR | Status: AC
  Filled 2013-05-23: qty 1

## 2013-05-23 MED ORDER — MIDAZOLAM HCL 2 MG/2ML IJ SOLN
INTRAMUSCULAR | Status: AC
  Administered 2013-05-23: 1 mg
  Filled 2013-05-23: qty 2

## 2013-05-23 MED ORDER — HYDROMORPHONE HCL PF 1 MG/ML IJ SOLN: 0.2500 mg | INTRAMUSCULAR | Status: DC | PRN

## 2013-05-23 MED ORDER — LIDOCAINE HCL (CARDIAC) 20 MG/ML IV SOLN
INTRAVENOUS | Status: DC | PRN
  Administered 2013-05-23: 70 mg via INTRAVENOUS

## 2013-05-23 MED ORDER — EPHEDRINE SULFATE 50 MG/ML IJ SOLN
INTRAMUSCULAR | Status: DC | PRN
  Administered 2013-05-23 (×5): 5 mg via INTRAVENOUS
  Administered 2013-05-23: 10 mg via INTRAVENOUS
  Administered 2013-05-23: 5 mg via INTRAVENOUS

## 2013-05-23 MED ORDER — SODIUM CHLORIDE 0.9 % IJ SOLN
INTRAMUSCULAR | Status: AC
  Filled 2013-05-23: qty 10

## 2013-05-23 MED ORDER — SODIUM CHLORIDE 0.9 % IR SOLN
Status: DC | PRN
  Administered 2013-05-23 (×4): 3000 mL

## 2013-05-23 MED ORDER — NEOSTIGMINE METHYLSULFATE 10 MG/10ML IV SOLN
INTRAVENOUS | Status: DC | PRN
  Administered 2013-05-23: 3 mg via INTRAVENOUS

## 2013-05-23 SURGICAL SUPPLY — 66 items
ANCHOR PEEK 4.75X19.1 SWLK C (Anchor) ×6 IMPLANT
ANCHOR PEEK CORKSCREW 4.5 (Anchor) ×2 IMPLANT
ANCHOR PEEK CORKSCREW 4.5MM (Anchor) ×1 IMPLANT
BLADE CUTTER GATOR 3.5 (BLADE) ×3 IMPLANT
BLADE GREAT WHITE 4.2 (BLADE) ×2 IMPLANT
BLADE GREAT WHITE 4.2MM (BLADE) ×1
BLADE SURG 11 STRL SS (BLADE) ×3 IMPLANT
BOOTCOVER CLEANROOM LRG (PROTECTIVE WEAR) ×6 IMPLANT
BUR 3.5 LG SPHERICAL (BURR) IMPLANT
BUR OVAL 4.0 (BURR) ×3 IMPLANT
BURR 3.5 LG SPHERICAL (BURR)
BURR 3.5MM LG SPHERICAL (BURR)
CANISTER SUCT LVC 12 LTR MEDI- (MISCELLANEOUS) ×3 IMPLANT
CANNULA ACUFLEX KIT 5X76 (CANNULA) ×3 IMPLANT
CANNULA DRILOCK 5.0MMX75MM (CANNULA) ×3
CANNULA DRILOCK 5.0X75 (CANNULA) ×6 IMPLANT
CLOSURE STERI-STRIP 1/2X4 (GAUZE/BANDAGES/DRESSINGS) ×1
CLOSURE WOUND 1/2 X4 (GAUZE/BANDAGES/DRESSINGS) ×1
CLSR STERI-STRIP ANTIMIC 1/2X4 (GAUZE/BANDAGES/DRESSINGS) ×2 IMPLANT
CONNECTOR 5 IN 1 STRAIGHT STRL (MISCELLANEOUS) ×3 IMPLANT
DRAPE INCISE 23X17 IOBAN STRL (DRAPES) ×2
DRAPE INCISE IOBAN 23X17 STRL (DRAPES) ×1 IMPLANT
DRAPE INCISE IOBAN 66X45 STRL (DRAPES) ×3 IMPLANT
DRAPE STERI 35X30 U-POUCH (DRAPES) ×3 IMPLANT
DRAPE SURG 17X11 SM STRL (DRAPES) ×3 IMPLANT
DRAPE U-SHAPE 47X51 STRL (DRAPES) ×3 IMPLANT
DRSG PAD ABDOMINAL 8X10 ST (GAUZE/BANDAGES/DRESSINGS) ×6 IMPLANT
DURAPREP 26ML APPLICATOR (WOUND CARE) ×6 IMPLANT
ELECT REM PT RETURN 9FT ADLT (ELECTROSURGICAL) ×3
ELECTRODE REM PT RTRN 9FT ADLT (ELECTROSURGICAL) ×1 IMPLANT
GLOVE BIO SURGEON STRL SZ7.5 (GLOVE) ×3 IMPLANT
GLOVE BIO SURGEON STRL SZ8 (GLOVE) ×3 IMPLANT
GLOVE EUDERMIC 7 POWDERFREE (GLOVE) ×3 IMPLANT
GLOVE SS BIOGEL STRL SZ 7.5 (GLOVE) ×1 IMPLANT
GLOVE SUPERSENSE BIOGEL SZ 7.5 (GLOVE) ×2
GOWN STRL REUS W/ TWL LRG LVL3 (GOWN DISPOSABLE) ×1 IMPLANT
GOWN STRL REUS W/ TWL XL LVL3 (GOWN DISPOSABLE) ×4 IMPLANT
GOWN STRL REUS W/TWL LRG LVL3 (GOWN DISPOSABLE) ×2
GOWN STRL REUS W/TWL XL LVL3 (GOWN DISPOSABLE) ×8
KIT BASIN OR (CUSTOM PROCEDURE TRAY) ×3 IMPLANT
KIT ROOM TURNOVER OR (KITS) ×3 IMPLANT
KIT SHOULDER TRACTION (DRAPES) ×3 IMPLANT
MANIFOLD NEPTUNE II (INSTRUMENTS) ×3 IMPLANT
NDL SUT 6 .5 CRC .975X.05 MAYO (NEEDLE) IMPLANT
NEEDLE MAYO TAPER (NEEDLE)
NEEDLE SPNL 18GX3.5 QUINCKE PK (NEEDLE) ×3 IMPLANT
NS IRRIG 1000ML POUR BTL (IV SOLUTION) ×3 IMPLANT
PACK SHOULDER (CUSTOM PROCEDURE TRAY) ×3 IMPLANT
PAD ARMBOARD 7.5X6 YLW CONV (MISCELLANEOUS) ×6 IMPLANT
SET ARTHROSCOPY TUBING (MISCELLANEOUS) ×2
SET ARTHROSCOPY TUBING LN (MISCELLANEOUS) ×1 IMPLANT
SLING ARM LRG ADULT FOAM STRAP (SOFTGOODS) IMPLANT
SLING ARM MED ADULT FOAM STRAP (SOFTGOODS) ×3 IMPLANT
SPONGE GAUZE 4X4 12PLY (GAUZE/BANDAGES/DRESSINGS) ×3 IMPLANT
SPONGE GAUZE 4X4 12PLY STER LF (GAUZE/BANDAGES/DRESSINGS) ×3 IMPLANT
SPONGE LAP 4X18 X RAY DECT (DISPOSABLE) ×3 IMPLANT
STRIP CLOSURE SKIN 1/2X4 (GAUZE/BANDAGES/DRESSINGS) ×2 IMPLANT
SUT MNCRL AB 3-0 PS2 18 (SUTURE) ×3 IMPLANT
SUT PDS AB 0 CT 36 (SUTURE) IMPLANT
SUT RETRIEVER GRASP 30 DEG (SUTURE) ×3 IMPLANT
SYR 20CC LL (SYRINGE) ×3 IMPLANT
TAPE PAPER 3X10 WHT MICROPORE (GAUZE/BANDAGES/DRESSINGS) ×3 IMPLANT
TOWEL OR 17X24 6PK STRL BLUE (TOWEL DISPOSABLE) ×3 IMPLANT
TOWEL OR 17X26 10 PK STRL BLUE (TOWEL DISPOSABLE) ×3 IMPLANT
WAND SUCTION MAX 4MM 90S (SURGICAL WAND) ×3 IMPLANT
WATER STERILE IRR 1000ML POUR (IV SOLUTION) ×3 IMPLANT

## 2013-05-23 NOTE — H&P (Signed)
[name] [name]    Chief Complaint: LEFT SHOULDER IMPINGEMENT  HPI: The patient is a 63 y.o. male with chronic left shoulder pain and MRI evidence for full thickness rotator cuff tear  Past Medical History  Diagnosis Date  . Coronary artery disease   . Diabetes mellitus   . Hypertension   . Myocardial infarction   . Chest pain     Pt reports chest pain at least weekly. Reports that Dr. Daneen Schick is aware.   . Anginal pain     Past Surgical History  Procedure Laterality Date  . Coronary artery bypass graft    . Coronary angioplasty with stent placement    . Shoulder surgery Right 7-8 years ago    Dr. Dorna Leitz  . Colonoscopy      Family History  Problem Relation Age of Onset  . Anesthesia problems Brother     Social History:  reports that he has quit smoking. He has quit using smokeless tobacco. He reports that he drinks about 1.2 ounces of alcohol per week. He reports that he does not use illicit drugs.  Allergies:  Allergies  Allergen Reactions  . Avandia [Rosiglitazone] Other (See Comments)    Weight gain  . Codeine Itching, Swelling and Other (See Comments)    Burning and heart flutter dizziness    Medications Prior to Admission  Medication Sig Dispense Refill  . ALPRAZolam (XANAX) 0.25 MG tablet Take 0.25 mg by mouth 3 (three) times daily as needed. For anxiety       . aspirin 325 MG tablet Take 325 mg by mouth daily.        Marland Kitchen atorvastatin (LIPITOR) 40 MG tablet Take 40 mg by mouth daily.      . cetirizine (ZYRTEC) 10 MG tablet Take 10 mg by mouth daily.      . cholecalciferol (VITAMIN D) 1000 UNITS tablet Take 1,000 Units by mouth daily.      . clopidogrel (PLAVIX) 75 MG tablet Take 75 mg by mouth daily with breakfast.      . desoximetasone (TOPICORT) 0.25 % cream Apply 1 application topically 2 (two) times daily as needed (for itching).      Marland Kitchen diltiazem (CARDIZEM LA) 420 MG 24 hr tablet Take 420 mg by mouth daily.      . fluticasone (FLONASE) 50 MCG/ACT  nasal spray Place 2 sprays into the nose daily as needed. For nasal congestion        . hydrocortisone cream 1 % Apply 1 application topically 2 (two) times daily as needed (for eczema).      . isosorbide mononitrate (IMDUR) 120 MG 24 hr tablet Take 120 mg by mouth daily.      Marland Kitchen KRILL OIL PO Take 1 capsule by mouth daily.      . metFORMIN (GLUCOPHAGE) 1000 MG tablet Take 1,000 mg by mouth 2 (two) times daily with a meal.       . metoprolol succinate (TOPROL-XL) 50 MG 24 hr tablet Take 50 mg by mouth daily. Take with or immediately following a meal.      . nitroGLYCERIN (NITROSTAT) 0.4 MG SL tablet Place 0.4 mg under the tongue every 5 (five) minutes x 3 doses as needed. For chest pain      . Polyvinyl Alcohol-Povidone (CLEAR EYES ALL SEASONS OP) Place 1 drop into both eyes daily as needed (for dry eyes).      . quinapril (ACCUPRIL) 20 MG tablet Take 20 mg by mouth at bedtime.      Marland Kitchen  sitaGLIPtin (JANUVIA) 100 MG tablet Take 100 mg by mouth daily.      . traMADol (ULTRAM) 50 MG tablet Take 50 mg by mouth every 6 (six) hours as needed. For pain.         Physical Exam: left shoulder with painful and restricted motion as noted at recent office visits  Vitals  Temp:  [97.6 F (36.4 C)] 97.6 F (36.4 C) (05/21 0844) Pulse Rate:  [53] 53 (05/21 0910) Resp:  [12-18] 14 (05/21 0910) BP: (127-143)/(53-65) 127/53 mmHg (05/21 0910) SpO2:  [100 %] 100 % (05/21 0910) Weight:  [80.74 kg (178 lb)] 80.74 kg (178 lb) (05/21 0844)  Assessment/Plan  Impression: LEFT SHOULDER IMPINGEMENT   Plan of Action: Procedure(s): LEFT SHOULDER ARTHROSCOPY SUBACROMIAL  DECOMPRESSION DISTAL CLAVICLE RESECTION AND ROTATOR CUFF REPAIR   Metta Clines Everardo Voris 05/23/2013, 9:29 AM

## 2013-05-23 NOTE — Anesthesia Preprocedure Evaluation (Addendum)
Anesthesia Evaluation  Patient identified by MRN, date of birth, ID band Patient awake    Reviewed: Allergy & Precautions, H&P , NPO status , Patient's Chart, lab work & pertinent test results, reviewed documented beta blocker date and time   History of Anesthesia Complications Negative for: history of anesthetic complications  Airway Mallampati: III TM Distance: >3 FB Neck ROM: full    Dental  (+) Poor Dentition, Chipped, Dental Advisory Given   Pulmonary former smoker,  breath sounds clear to auscultation        Cardiovascular hypertension, Pt. on medications and Pt. on home beta blockers + angina with exertion + CAD, + Past MI and + CABG Rhythm:Regular Rate:Normal  Pt with stable angina.  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  Dr Tamala Julian (cardiologist) cleared for surgery.   Neuro/Psych negative neurological ROS  negative psych ROS   GI/Hepatic negative GI ROS, Neg liver ROS,   Endo/Other  diabetes, Type 2, Oral Hypoglycemic Agents  Renal/GU negative Renal ROS     Musculoskeletal   Abdominal   Peds  Hematology   Anesthesia Other Findings   Reproductive/Obstetrics negative OB ROS                          Anesthesia Physical Anesthesia Plan  ASA: III  Anesthesia Plan: General and Regional   Post-op Pain Management: MAC Combined w/ Regional for Post-op pain   Induction: Intravenous  Airway Management Planned: Oral ETT  Additional Equipment:   Intra-op Plan:   Post-operative Plan: Extubation in OR  Informed Consent: I have reviewed the patients History and Physical, chart, labs and discussed the procedure including the risks, benefits and alternatives for the proposed anesthesia with the patient or authorized representative who has indicated his/her understanding and acceptance.     Plan Discussed with: CRNA, Anesthesiologist and Surgeon  Anesthesia Plan Comments:          Anesthesia Quick Evaluation

## 2013-05-23 NOTE — Op Note (Signed)
05/23/2013  11:51 AM  PATIENT:   [name] [name]  63 y.o. male  PRE-OPERATIVE DIAGNOSIS:  LEFT SHOULDER IMPINGEMENT with ac joint oa and rotator cuff tear.  POST-OPERATIVE DIAGNOSIS:  Same with labral tear and bicep tendon subluxation  PROCEDURE:  LSA, bicep tenotomy, labral debridement, SAD, DCR, RCR  SURGEON:  Josette Shimabukuro, Metta Clines. M.D.  ASSISTANTS: Shuford pac   ANESTHESIA:   GET + ISB  EBL: min  SPECIMEN:  none  Drains: none   PATIENT DISPOSITION:  PACU - hemodynamically stable.    PLAN OF CARE: Discharge to home after PACU  Dictation# [id number]

## 2013-05-23 NOTE — Anesthesia Postprocedure Evaluation (Signed)
Anesthesia Post Note  Patient: [name] [name]  Procedure(s) Performed: Procedure(s) (LRB): LEFT SHOULDER ARTHROSCOPY SUBACROMIAL  DECOMPRESSION DISTAL CLAVICLE RESECTION AND ROTATOR CUFF REPAIR  (Left)  Anesthesia type: General  Patient location: PACU  Post pain: Pain level controlled and Adequate analgesia  Post assessment: Post-op Vital signs reviewed, Patient's Cardiovascular Status Stable, Respiratory Function Stable, Patent Airway and Pain level controlled  Last Vitals:  Filed Vitals:   05/23/13 1300  BP:   Pulse: 52  Temp: 35.9 C  Resp: 16    Post vital signs: Reviewed and stable  Level of consciousness: awake, alert  and oriented  Complications: No apparent anesthesia complications

## 2013-05-23 NOTE — Discharge Instructions (Signed)
° °  Metta Clines. Supple, M.D., F.A.A.O.S. Orthopaedic Surgery Specializing in Arthroscopic and Reconstructive Surgery of the Shoulder and Knee [phone] [address] [place], [place] [address] - Fax [phone]  POST-OP SHOULDER ARTHROSCOPIC ROTATOR CUFF AND/OR LABRAL REPAIR INSTRUCTIONS  1. Call the office at [phone] to schedule your first post-op appointment 7-10 days from the date of your surgery.  2. Leave the steri-strips in place over your incisions when performing dressing changes and showering. You may remove your dressings and begin showering 72 hours from surgery. You can expect drainage that is clear to bloody in nature that occasionally will soak through your dressings. If this occurs go ahead and perform a dressing change. The drainage should lessen daily and when there is no drainage from your incisions feel free to go without a dressing.  3. Wear your sling/immobilizer at all times except to perform the exercises below or to occasionally let your arm dangle by your side to stretch your elbow. You also need to sleep in your sling immobilizer until instructed otherwise.  4. Range of motion to your elbow, wrist, and hand are encouraged 3-5 times daily. Exercise to your hand and fingers helps to reduce swelling you may experience.  5. Utilize ice to the shoulder 3-4 times minimum a day and additionally if you are experiencing pain.  6. You may one-armed drive when safely off of narcotics and muscle relaxants. You may use your hand that is in the sling to support the steering wheel only. However, should it be your right arm that is in the sling it is not to be used for gear shifting in a manual transmission.  7. If you had a block pre-operatively to provide post-op pain relief you may want to go ahead and begin utilizing your pain meds as your arm begins to wake up. Blocks can sometimes last up to 16-18 hours. If you are still pain-free prior to going to bed you may  want to strongly consider taking a pain medication to avoid being awakened in the night with the onset of pain. A muscle relaxant is also provided for you should you experience muscle spasms. It is recommended that if you are experiencing pain that your pain medication alone is not controlling, add the muscle relaxant along with the pain medication which can give additional pain relief. The first one to two days is generally the most severe of your pain and then should gradually decrease. As your pain lessens it is recommended that you decrease your use of the pain medications to an "as needed basis" only and to always comply with the recommended dosages of the pain medications.  8. Pain medications can produce constipation along with their use. If you experience this, the use of an over the counter stool softener or laxative daily is recommended.   9. For additional questions or concerns, please do not hesitate to call the office. If after hours there is an answering service to forward your concerns to the physician on call.  POST-OP EXERCISES  Pendulum Exercises  Perform pendulum exercises while standing and bending at the waist. Support your uninvolved arm on a table or chair and allow your operated arm to hang freely. Make sure to do these exercises passively - not using you shoulder muscle.  Repeat 20 times. Do 3 sessions per day.

## 2013-05-23 NOTE — Anesthesia Procedure Notes (Addendum)
Anesthesia Regional Block:  Interscalene brachial plexus block  Pre-Anesthetic Checklist: ,, timeout performed, Correct Patient, Correct Site, Correct Laterality, Correct Procedure, Correct Position, site marked, Risks and benefits discussed,  Surgical consent,  Pre-op evaluation,  At surgeon's request and post-op pain management  Laterality: Left  Prep: chloraprep       Needles:  Injection technique: Single-shot  Needle Type: Echogenic Stimulator Needle     Needle Length: 5cm 5 cm Needle Gauge: 22 and 22 G    Additional Needles:  Procedures: ultrasound guided (picture in chart) and nerve stimulator Interscalene brachial plexus block  Nerve Stimulator or Paresthesia:  Response: biceps flexion, 0.45 mA,   Additional Responses:   Narrative:  Start time: 05/23/2013 9:10 AM End time: 05/23/2013 9:19 AM Injection made incrementally with aspirations every 5 mL.  Performed by: Personally  Anesthesiologist: Dr Marcie Bal  Additional Notes: Functioning IV was confirmed and monitors were applied.  A 42mm 22ga Arrow echogenic stimulator needle was used. Sterile prep and drape,hand hygiene and sterile gloves were used.  Negative aspiration and negative test dose prior to incremental administration of local anesthetic. The patient tolerated the procedure well.  Ultrasound guidance: relevent anatomy identified, needle position confirmed, local anesthetic spread visualized around nerve(s), vascular puncture avoided.  Image printed for medical record.    Procedure Name: Intubation Date/Time: 05/23/2013 10:11 AM Performed by: Jenne Campus Pre-anesthesia Checklist: Patient identified, Emergency Drugs available, Suction available, Patient being monitored and Timeout performed Patient Re-evaluated:Patient Re-evaluated prior to inductionOxygen Delivery Method: Circle system utilized Preoxygenation: Pre-oxygenation with 100% oxygen Intubation Type: IV induction Ventilation: Mask ventilation  without difficulty and Oral airway inserted - appropriate to patient size Laryngoscope Size: Miller and 2 Grade View: Grade I Tube type: Oral Tube size: 7.5 mm Number of attempts: 1 Airway Equipment and Method: Stylet Placement Confirmation: ETT inserted through vocal cords under direct vision,  positive ETCO2,  CO2 detector and breath sounds checked- equal and bilateral Secured at: 22 cm Tube secured with: Tape Dental Injury: Teeth and Oropharynx as per pre-operative assessment

## 2013-05-23 NOTE — Transfer of Care (Signed)
Immediate Anesthesia Transfer of Care Note  Patient: Artavious Eccleston  Procedure(s) Performed: Procedure(s): LEFT SHOULDER ARTHROSCOPY SUBACROMIAL  DECOMPRESSION DISTAL CLAVICLE RESECTION AND ROTATOR CUFF REPAIR  (Left)  Patient Location: PACU  Anesthesia Type:General  Level of Consciousness: awake, alert  and oriented  Airway & Oxygen Therapy: Patient Spontanous Breathing and Patient connected to nasal cannula oxygen  Post-op Assessment: Report given to PACU RN, Post -op Vital signs reviewed and stable and Patient moving all extremities  Post vital signs: Reviewed and stable  Complications: No apparent anesthesia complications

## 2013-05-24 ENCOUNTER — Encounter (HOSPITAL_COMMUNITY): Payer: Self-pay | Admitting: Orthopedic Surgery

## 2013-05-24 NOTE — Op Note (Signed)
NAMEPIERCE, BAROCIO NO.:  [id number]  MEDICAL RECORD NO.:  [id number]  LOCATION:  [facility]                         FACILITY:  [facility]  PHYSICIAN:  Metta Clines. Kellin Fifer, M.D.  DATE OF BIRTH:  10-Jun-1950  DATE OF PROCEDURE:  05/23/2013 DATE OF DISCHARGE:  05/23/2013                              OPERATIVE REPORT   PREOPERATIVE DIAGNOSES: 1. Chronic left shoulder pain with impingement syndrome and full-     thickness rotator cuff tear. 2. Left shoulder acromioclavicular joint arthrosis.  POSTOPERATIVE DIAGNOSES: 1. Chronic left shoulder impingement syndrome. 2. Left shoulder full-thickness rotator cuff tear. 3. Left shoulder acromioclavicular joint arthropathy. 4. Diffuse calcific tendinitis. 5. Biceps tendon subluxation. 6. Labral tear.  PROCEDURE: 1. Left shoulder examination under anesthesia. 2. Left shoulder glenoid joint diagnostic arthroscopy. 3. Biceps tendon tenotomy. 4. Labral debridement. 5. Arthroscopic subacromial decompression bursectomy. 6. Debridement of multiple areas of calcific     tendinitis/chondrocalcinosis. 7. Arthroscopic subacromial decompression. 8. Arthroscopic distal clavicle resection. 9. Arthroscopic rotator cuff repair using double row sutures for     repair construct.  SURGEON:  Metta Clines. Maesyn Frisinger, M.D.  Terrence DupontOlivia Mackie A. Shuford, P.A.-C.  ANESTHESIA:  General endotracheal as well as an interscalene block.  ESTIMATED BLOOD LOSS:  Minimal.  DRAINS:  None.  HISTORY:  Mr. [name] [name] is a 63 year old gentleman who has had chronic left shoulder pain after an original work-related injury having sustained a full-thickness rotator cuff tear.  His ultimate treatment of the shoulder condition was delayed related to underlying cardiac issues which needed interventional treatment and he has now been medically optimized and stabilized for planned left shoulder surgery as described below.  Preoperatively, I had counseled Mr. Petrovich regarding  treatment options and risks versus benefits thereof.  Possible surgical complications were reviewed including potential for bleeding, infection, neurovascular injury, persistent pain, loss of motion, anesthetic complication, recurrence of rotator cuff tear, possible need for additional surgery. He understands and accepts and agrees with our planned procedure.  PROCEDURE IN DETAIL:  After undergoing routine preop evaluation, the patient received prophylactic antibiotics.  An interscalene block was established in the holding area by the Anesthesia Department.  Placed supine on the operating table, underwent smooth induction of a general endotracheal anesthesia.  Turned to the right lateral decubitus position on a beanbag and appropriately padded and protected.  Left shoulder examination under anesthesia revealed full motion and no instability patterns noted.  Left arm was suspended at 70 degrees of abduction with 10 pounds traction and the left shoulder girdle region had been sterilely prepped and draped in standard fashion.  Time-out was called. Posterior portal was established in the glenohumeral joint and __________ was established under direct visualization.  The articular surfaces were found to be in good condition.  Medially evident was evidence for subluxation of the biceps tendon through the rotator cuff tear and given the degenerative nature of wound, I had performed a biceps tendon tenotomy.  There also was degenerative labral tearing superiorly, which we debrided with shaver.  No instability patterns were noted.  There was an obvious full-thickness tear of rotator cuff.  At this point, fluid and instruments were then removed from the glenohumeral  joint.  Arms were then dropped down to __________ degrees of abduction with arthroscope introduced in the subacromial space through the posterior portal and a direct lateral __________ the subacromial space.  Abundant dense bursal  tissue and multiple adhesions were encountered and also multiple areas of calcifications of the soft tissues consistent with calcific tendinitis and dystrophic calcification particularly about the [facility] joint.  These areas were all debrided.  A subacromial decompression was then performed with __________ morphology. Portal was then established directly into the distal clavicle and distal clavicle resection was performed with a bur.  Care was taken to confirm visualization of entire circumference of the distal clavicle to ensure adequate removal of bone.  We then completed the subacromial/subdeltoid bursectomy, and again, there was dense bursal tissue and multiple adhesions throughout the space which was excised and then, the wand was used to obtain hemostasis.  The rotator tear was readily identified and debrided back to stable margin of tissue with the footprint approximately 3 cm in width.  We then removed the soft tissue __________ tuberosity and gently abraded the bone to bleeding bed.  Accessory portal __________ was established and through a stab wound on the plantar margin of the acromion, placed an Arthrex PEEK corkscrew suture anchor.  Limbs of the suture anchor were then passed through the free margin of the rotator cuff in a horizontal mattress pattern and then tied with sliding locking knots followed by multiple overhand throws and alternating __________ created "suture bridge" with 2 SwiveLock suture anchors, which nicely decompressed the margin of the rotator cuff against the __________ tuberosity.  The overall construct was much to our satisfaction.  Suture limbs were clipped.  Final hemostasis was obtained.  Fluid __________ removed.  The ports were closed with Monocryl and Steri-Strips, a dry dressing __________ left shoulder, left arm was placed in sling immobilizer.  The patient was awakened, extubated, and taken to recovery room in stable condition.  Jenetta Loges, PA-C,  was used as an Environmental consultant throughout this case, essential for help with positioning of the patient, positioning of the extremity, management the arthroscopic equipment, tissue manipulations, suture management, wound closure, and intraoperative decision making.     Metta Clines. Gray Doering, M.D.    KMS/MEDQ  D:  05/23/2013  T:  05/24/2013  Job:  [id number]

## 2013-10-15 ENCOUNTER — Other Ambulatory Visit: Payer: Self-pay | Admitting: Internal Medicine

## 2013-10-15 DIAGNOSIS — Z136 Encounter for screening for cardiovascular disorders: Secondary | ICD-10-CM

## 2013-10-23 ENCOUNTER — Ambulatory Visit
Admission: RE | Admit: 2013-10-23 | Discharge: 2013-10-23 | Disposition: A | Discharge status: Home / Self Care | Payer: Medicare Other | Source: Ambulatory Visit | Attending: Internal Medicine | Admitting: Internal Medicine

## 2013-10-23 DIAGNOSIS — Z136 Encounter for screening for cardiovascular disorders: Secondary | ICD-10-CM

## 2013-11-08 ENCOUNTER — Ambulatory Visit (INDEPENDENT_AMBULATORY_CARE_PROVIDER_SITE_OTHER): Payer: Medicare Other | Admitting: Interventional Cardiology

## 2013-11-08 ENCOUNTER — Encounter: Payer: Self-pay | Admitting: Interventional Cardiology

## 2013-11-08 VITALS — BP 136/78 | HR 50 | Ht 66.0 in | Wt 181.1 lb

## 2013-11-08 DIAGNOSIS — I25709 Atherosclerosis of coronary artery bypass graft(s), unspecified, with unspecified angina pectoris: Secondary | ICD-10-CM

## 2013-11-08 DIAGNOSIS — E1159 Type 2 diabetes mellitus with other circulatory complications: Secondary | ICD-10-CM

## 2013-11-08 DIAGNOSIS — E785 Hyperlipidemia, unspecified: Secondary | ICD-10-CM

## 2013-11-08 DIAGNOSIS — I5032 Chronic diastolic (congestive) heart failure: Secondary | ICD-10-CM

## 2013-11-08 DIAGNOSIS — I10 Essential (primary) hypertension: Secondary | ICD-10-CM

## 2013-11-08 NOTE — Progress Notes (Signed)
Patient ID: [name] [name], male   DOB: 15-Jul-1950, 63 y.o.   MRN: [id number]    [address] [place], [place]  [address] Phone: [phone] Fax:  [phone]  Date:  11/08/2013   ID:  [name] [name], DOB February 25, 1950, MRN [id number]  PCP:  Irven Shelling, MD   ASSESSMENT:  1. Coronary artery disease, status post coronary bypass surgery, with angina, stable 2. Systemic arterial essential hypertension, stable 3. Chronic diastolic heart failure 4. Hyperlipidemia  PLAN:  1. Encouraged physical aerobic activity 2. No change in medical regimen 3. Clinic follow-up in one year   SUBJECTIVE: [name] [name] is a 63 y.o. male who ran out of diltiazem for 48 hours and noticed angina requiring nitroglycerin. He is now back on diltiazem and have a no difficulty. He states that overall he is been very stable from the standpoint of physical activity. Angina is not restricting his lifestyle. He denies orthopnea, PND, palpitations, and syncope. No neurological complaints.   Wt Readings from Last 3 Encounters:  11/08/13 181 lb 1.9 oz (82.155 kg)  05/23/13 178 lb (80.74 kg)  05/20/13 178 lb 12.7 oz (81.1 kg)     Past Medical History  Diagnosis Date  . Coronary artery disease   . Diabetes mellitus   . Hypertension   . Myocardial infarction   . Chest pain     Pt reports chest pain at least weekly. Reports that Dr. Daneen Schick is aware.   . Anginal pain     Current Outpatient Prescriptions  Medication Sig Dispense Refill  . ALPRAZolam (XANAX) 0.25 MG tablet Take 0.25 mg by mouth 3 (three) times daily as needed. For anxiety     . aspirin 325 MG tablet Take 325 mg by mouth daily.      Marland Kitchen atorvastatin (LIPITOR) 40 MG tablet Take 40 mg by mouth daily.    . cholecalciferol (VITAMIN D) 1000 UNITS tablet Take 1,000 Units by mouth daily.    . clopidogrel (PLAVIX) 75 MG tablet Take 75 mg by mouth daily with breakfast.    . desoximetasone (TOPICORT) 0.25 % cream Apply 1 application  topically 2 (two) times daily as needed (for itching).    . diazepam (VALIUM) 5 MG tablet Take 0.5-1 tablets (2.5-5 mg total) by mouth every 6 (six) hours as needed for muscle spasms or sedation. 40 tablet 1  . diltiazem (CARDIZEM LA) 420 MG 24 hr tablet Take 420 mg by mouth daily.    . fluticasone (FLONASE) 50 MCG/ACT nasal spray Place 2 sprays into the nose daily as needed. For nasal congestion      . hydrocortisone cream 1 % Apply 1 application topically 2 (two) times daily as needed (for eczema).    . isosorbide mononitrate (IMDUR) 120 MG 24 hr tablet Take 120 mg by mouth daily.    . metoprolol succinate (TOPROL-XL) 50 MG 24 hr tablet Take 50 mg by mouth daily. Take with or immediately following a meal.    . nitroGLYCERIN (NITROSTAT) 0.4 MG SL tablet Place 0.4 mg under the tongue every 5 (five) minutes x 3 doses as needed. For chest pain    . Polyvinyl Alcohol-Povidone (CLEAR EYES ALL SEASONS OP) Place 1 drop into both eyes daily as needed (for dry eyes).    . quinapril (ACCUPRIL) 20 MG tablet Take 20 mg by mouth at bedtime.    . sitaGLIPtin (JANUVIA) 100 MG tablet Take 100 mg by mouth daily.    . traMADol (ULTRAM) 50 MG tablet  Take 50 mg by mouth every 6 (six) hours as needed. For pain.    Marland Kitchen KRILL OIL PO Take 1 capsule by mouth daily.     No current facility-administered medications for this visit.    Allergies:    Allergies  Allergen Reactions  . Avandia [Rosiglitazone] Other (See Comments)    Weight gain  . Codeine Itching, Swelling and Other (See Comments)    Burning and heart flutter dizziness    Social History:  The patient  reports that he has quit smoking. He has quit using smokeless tobacco. He reports that he drinks about 1.2 oz of alcohol per week. He reports that he does not use illicit drugs.   ROS:  Please see the history of present illness.   Denies claudication. Denies abdominal pain postprandial. No edema. No orthopnea.   All other systems reviewed and negative.    OBJECTIVE: VS:  BP 136/78 mmHg  Pulse 50  Ht 5\' 6"  (1.676 m)  Wt 181 lb 1.9 oz (82.155 kg)  BMI 29.25 kg/m2 Well nourished, well developed, in no acute distress, appropriate for age HEENT: normal Neck: JVD flat. Carotid bruit absent  Cardiac:  normal S1, S2; RRR; no murmur Lungs:  clear to auscultation bilaterally, no wheezing, rhonchi or rales Abd: soft, nontender, no hepatomegaly Ext: Edema absent. Pulses 2+ Skin: warm and dry Neuro:  CNs 2-12 intact, no focal abnormalities noted  EKG:  Sinus bradycardia with first-degree AV block but otherwise unremarkable       Signed, Illene Labrador III, MD 11/08/2013 9:47 AM

## 2013-11-08 NOTE — Patient Instructions (Signed)
Your physician recommends that you continue on your current medications as directed. Please refer to the Current Medication list given to you today.  Your physician discussed the importance of regular exercise and recommended that you start or continue a regular exercise program for good health.   Your physician wants you to follow-up in: 1 year You will receive a reminder letter in the mail two months in advance. If you don't receive a letter, please call our office to schedule the follow-up appointment.

## 2014-07-22 ENCOUNTER — Telehealth: Payer: Self-pay | Admitting: Interventional Cardiology

## 2014-07-22 DIAGNOSIS — R29898 Other symptoms and signs involving the musculoskeletal system: Secondary | ICD-10-CM

## 2014-07-22 DIAGNOSIS — M79604 Pain in right leg: Secondary | ICD-10-CM

## 2014-07-22 NOTE — Telephone Encounter (Signed)
Returned pt call. Spoke with pt wife who sts that pt was seen by his pcp, who was concerned about pt LE circulation. No testing was ordered. Pt pcp recommended pt f/u with Dr.Smith. Adv her that Dr.Smith is currently out of the office, pt may ne LE study.Dr.Smith earliest available appt is probably mid Sept. Adv pt wife I am in a different office location today. I will f/u tomorrow when I return to the [facility] office and am able to talk with another provider in the office for further recoomendation

## 2014-07-22 NOTE — Telephone Encounter (Signed)
New message     Patient calling spoke with the orthopedic physician on yesterday - was told that he might have a circulation problem in his legs.

## 2014-07-23 ENCOUNTER — Other Ambulatory Visit: Payer: Self-pay | Admitting: Interventional Cardiology

## 2014-07-23 DIAGNOSIS — I739 Peripheral vascular disease, unspecified: Secondary | ICD-10-CM

## 2014-07-23 DIAGNOSIS — R29898 Other symptoms and signs involving the musculoskeletal system: Secondary | ICD-10-CM

## 2014-07-23 DIAGNOSIS — M79604 Pain in right leg: Secondary | ICD-10-CM

## 2014-07-23 NOTE — Telephone Encounter (Signed)
Pt sts that he has been having fatigue in both legs. He has had increased pain in the right leg. He injured his right knee a couple of years ago and has swelling and pain in that leg from time to time. Pt has been seen recently by his pcp and a orthopedic, who recommended he f/u with Dr.Smith to have LE dopp.  Adv pt that Dr.Smith is currently out of the office, I have spoken with another provider in the office Lucillie Garfinkel, it would be reasonable to have a LE study to r/o and blockages. Adv pt a scheduler from our office will call him to schedule the test. Pt verbalized understanding.

## 2014-07-23 NOTE — Telephone Encounter (Signed)
Pt LE dopp scheduled for 7/25 @ 1:30 pm

## 2014-07-25 NOTE — Telephone Encounter (Signed)
FYI

## 2014-07-27 NOTE — Telephone Encounter (Signed)
noted 

## 2014-07-28 ENCOUNTER — Other Ambulatory Visit: Payer: Self-pay | Admitting: Interventional Cardiology

## 2014-07-28 ENCOUNTER — Ambulatory Visit (HOSPITAL_COMMUNITY): Payer: Medicare Other | Attending: Interventional Cardiology

## 2014-07-28 DIAGNOSIS — R5383 Other fatigue: Secondary | ICD-10-CM | POA: Diagnosis not present

## 2014-07-28 DIAGNOSIS — I739 Peripheral vascular disease, unspecified: Secondary | ICD-10-CM

## 2014-07-28 DIAGNOSIS — M79604 Pain in right leg: Secondary | ICD-10-CM | POA: Diagnosis present

## 2014-07-28 DIAGNOSIS — R29898 Other symptoms and signs involving the musculoskeletal system: Secondary | ICD-10-CM

## 2014-07-30 ENCOUNTER — Telehealth: Payer: Self-pay

## 2014-07-30 NOTE — Telephone Encounter (Signed)
-----   Message from Belva Crome, MD sent at 07/29/2014  8:42 PM EDT ----- There is right greater than left lower extremity PAD accounting for discomfort with exertion. Please refer to Dr. Fletcher Anon or Dr. Gwenlyn Found for consultation

## 2014-07-30 NOTE — Telephone Encounter (Signed)
Pt aware of LE study results and Dr.Smith's recommendation There is right greater than left lower extremity PAD accounting for discomfort with exertion. Please refer to Dr. Fletcher Anon or Dr. Gwenlyn Found for consultation Adv pt a scheduler will call pt to schedule  Consult with Dr.Berry or Dr.Arida. Pt verbalized understanding.

## 2014-07-30 NOTE — Telephone Encounter (Signed)
Called to give pt LE study results and Dr.Smith's recommendation. [email or web address]

## 2014-08-12 ENCOUNTER — Encounter: Payer: Self-pay | Admitting: Cardiovascular Disease

## 2014-08-12 ENCOUNTER — Ambulatory Visit (INDEPENDENT_AMBULATORY_CARE_PROVIDER_SITE_OTHER): Payer: Medicare Other | Admitting: Cardiovascular Disease

## 2014-08-12 VITALS — BP 140/74 | HR 55 | Ht 66.5 in | Wt 178.0 lb

## 2014-08-12 DIAGNOSIS — I25118 Atherosclerotic heart disease of native coronary artery with other forms of angina pectoris: Secondary | ICD-10-CM | POA: Diagnosis not present

## 2014-08-12 DIAGNOSIS — I739 Peripheral vascular disease, unspecified: Secondary | ICD-10-CM | POA: Diagnosis not present

## 2014-08-12 NOTE — Patient Instructions (Signed)
Medication Instructions:  Your physician recommends that you continue on your current medications as directed. Please refer to the Current Medication list given to you today.   Labwork: NONE  Testing/Procedures: NONE  Follow-Up: Your physician recommends that you schedule a follow-up appointment in: 1 month with Dr. Fletcher Anon.   Any Other Special Instructions Will Be Listed Below (If Applicable).

## 2014-08-16 ENCOUNTER — Encounter: Payer: Self-pay | Admitting: Cardiovascular Disease

## 2014-08-16 DIAGNOSIS — I739 Peripheral vascular disease, unspecified: Secondary | ICD-10-CM

## 2014-08-16 HISTORY — DX: Peripheral vascular disease, unspecified: I73.9

## 2014-08-16 NOTE — Assessment & Plan Note (Signed)
He reports rare episodes of chest pain with no recent worsening.

## 2014-08-16 NOTE — Progress Notes (Signed)
Patient ID: [name] [name], male   DOB: 06-23-1950, 64 y.o.   MRN: [id number]     Date:  08/16/2014   ID:  [name] [name], DOB [birth year], MRN [id number]  PCP:  Irven Shelling, MD  Primary cardiologist: Dr. Tamala Julian    SUBJECTIVE: [name] [name] is a 64 y.o. male who was referred by Dr. Tamala Julian for evaluation and management of peripheral arterial disease. He has known history of coronary artery disease status post CABG, chronic diastolic heart failure, hypertension, type 2 diabetes and hyperlipidemia.  3 months ago, he started having right calf discomfort and cramps with walking. This initially progressed to both sides but since then has been gradually improving with walking. His symptoms are mostly noticeable when he is going uphill. He use to walk 3 or 4 months at the time but he has slowed down due to concerns about the discomfort. He has not tried to advance his activities again since his symptoms improved. He reports no rest pain and has no lower extremity ulceration. He has rare episodes of chest discomfort with no recent worsening. He underwent noninvasive vascular evaluation which showed an ABI of 0.79 on the right and 1.0 on the left. Duplex showed bilateral common iliac artery stenosis greater on the right with a peak velocity of 458. There was also severe right popliteal artery stenosis with a peak velocity of 425 and three-vessel runoff bilaterally.  Wt Readings from Last 3 Encounters:  08/12/14 178 lb (80.74 kg)  11/08/13 181 lb 1.9 oz (82.155 kg)  05/23/13 178 lb (80.74 kg)     Past Medical History  Diagnosis Date  . Coronary artery disease   . Diabetes mellitus   . Hypertension   . Myocardial infarction   . Chest pain     Pt reports chest pain at least weekly. Reports that Dr. Daneen Schick is aware.   . Anginal pain     Current Outpatient Prescriptions  Medication Sig Dispense Refill  . ALPRAZolam (XANAX) 0.25 MG tablet Take 0.25 mg by mouth 3 (three) times daily as needed.  For anxiety     . aspirin 325 MG tablet Take 325 mg by mouth daily.      Marland Kitchen atorvastatin (LIPITOR) 40 MG tablet Take 40 mg by mouth daily.    . cholecalciferol (VITAMIN D) 1000 UNITS tablet Take 1,000 Units by mouth daily.    . clopidogrel (PLAVIX) 75 MG tablet Take 75 mg by mouth daily with breakfast.    . desoximetasone (TOPICORT) 0.25 % cream Apply 1 application topically 2 (two) times daily as needed (for itching).    Marland Kitchen diltiazem (CARDIZEM LA) 420 MG 24 hr tablet Take 420 mg by mouth daily.    . fluticasone (FLONASE) 50 MCG/ACT nasal spray Place 2 sprays into the nose daily as needed. For nasal congestion      . hydrocortisone cream 1 % Apply 1 application topically 2 (two) times daily as needed (for eczema).    . isosorbide mononitrate (IMDUR) 120 MG 24 hr tablet Take 120 mg by mouth daily.    . metoprolol succinate (TOPROL-XL) 50 MG 24 hr tablet Take 50 mg by mouth daily. Take with or immediately following a meal.    . nitroGLYCERIN (NITROSTAT) 0.4 MG SL tablet Place 0.4 mg under the tongue every 5 (five) minutes x 3 doses as needed. For chest pain    . Polyvinyl Alcohol-Povidone (CLEAR EYES ALL SEASONS OP) Place 1 drop into both eyes daily as needed (for dry eyes).    Marland Kitchen  quinapril (ACCUPRIL) 20 MG tablet Take 20 mg by mouth at bedtime.    . sitaGLIPtin (JANUVIA) 100 MG tablet Take 100 mg by mouth daily.     No current facility-administered medications for this visit.    Allergies:    Allergies  Allergen Reactions  . Avandia [Rosiglitazone] Other (See Comments)    Weight gain  . Codeine Itching, Swelling and Other (See Comments)    Burning and heart flutter dizziness    Social History:  The patient  reports that he quit smoking about 27 years ago. He has quit using smokeless tobacco. He reports that he drinks about 1.2 oz of alcohol per week. He reports that he does not use illicit drugs.   ROS:  Please see the history of present illness.   Denies claudication. Denies abdominal  pain postprandial. No edema. No orthopnea.   All other systems reviewed and negative.   OBJECTIVE: VS:  BP 140/74 mmHg  Pulse 55  Ht 5' 6.5" (1.689 m)  Wt 178 lb (80.74 kg)  BMI 28.30 kg/m2 Well nourished, well developed, in no acute distress, appropriate for age 62: normal Neck: JVD flat. Carotid bruit absent  Cardiac:  normal S1, S2; RRR; no murmur Lungs:  clear to auscultation bilaterally, no wheezing, rhonchi or rales Abd: soft, nontender, no hepatomegaly Ext: Edema absent. Pulses 2+ Skin: warm and dry Neuro:  CNs 2-12 intact, no focal abnormalities noted Vascular: Femoral pulses are normal bilaterally. Posterior tibial: +1 on the right and +2 on the left. Bursitis pedis is not palpable.

## 2014-08-16 NOTE — Assessment & Plan Note (Signed)
The patient is mostly having right calf claudication and a lesser degree mild bilateral hip pain likely due to iliac disease. The right calf claudication is likely due mostly to the right popliteal disease with significant disease affecting the right common iliac artery. I discussed with him the natural history and management of peripheral arterial disease. He is already on optimal medical therapy for coronary artery disease. We discussed different management options including angiography with plans for endovascular intervention. However, the patient prefers to start with a conservative approach. I discussed with him the importance of a walking program and he is going to start that. I'm going to reevaluate his symptoms in one month and if there is no improvement, we can consider proceeding with angiography.

## 2014-08-27 ENCOUNTER — Encounter (HOSPITAL_COMMUNITY): Payer: Self-pay | Admitting: Neurology

## 2014-08-27 ENCOUNTER — Inpatient Hospital Stay (HOSPITAL_COMMUNITY)
Admission: EM | Admit: 2014-08-27 | Discharge: 2014-08-29 | DRG: 281 | Disposition: A | Discharge status: Home / Self Care | Payer: Medicare Other | Attending: Cardiology | Admitting: Cardiology

## 2014-08-27 ENCOUNTER — Emergency Department (HOSPITAL_COMMUNITY): Payer: Medicare Other

## 2014-08-27 DIAGNOSIS — Z7982 Long term (current) use of aspirin: Secondary | ICD-10-CM | POA: Diagnosis not present

## 2014-08-27 DIAGNOSIS — I200 Unstable angina: Secondary | ICD-10-CM | POA: Diagnosis not present

## 2014-08-27 DIAGNOSIS — I25119 Atherosclerotic heart disease of native coronary artery with unspecified angina pectoris: Secondary | ICD-10-CM | POA: Diagnosis present

## 2014-08-27 DIAGNOSIS — Z885 Allergy status to narcotic agent status: Secondary | ICD-10-CM | POA: Diagnosis not present

## 2014-08-27 DIAGNOSIS — I252 Old myocardial infarction: Secondary | ICD-10-CM

## 2014-08-27 DIAGNOSIS — I214 Non-ST elevation (NSTEMI) myocardial infarction: Principal | ICD-10-CM | POA: Diagnosis present

## 2014-08-27 DIAGNOSIS — E871 Hypo-osmolality and hyponatremia: Secondary | ICD-10-CM | POA: Diagnosis present

## 2014-08-27 DIAGNOSIS — E119 Type 2 diabetes mellitus without complications: Secondary | ICD-10-CM | POA: Diagnosis present

## 2014-08-27 DIAGNOSIS — R079 Chest pain, unspecified: Secondary | ICD-10-CM | POA: Diagnosis present

## 2014-08-27 DIAGNOSIS — I739 Peripheral vascular disease, unspecified: Secondary | ICD-10-CM | POA: Diagnosis present

## 2014-08-27 DIAGNOSIS — I209 Angina pectoris, unspecified: Secondary | ICD-10-CM

## 2014-08-27 DIAGNOSIS — Z888 Allergy status to other drugs, medicaments and biological substances status: Secondary | ICD-10-CM

## 2014-08-27 DIAGNOSIS — Z7902 Long term (current) use of antithrombotics/antiplatelets: Secondary | ICD-10-CM | POA: Diagnosis not present

## 2014-08-27 DIAGNOSIS — I2510 Atherosclerotic heart disease of native coronary artery without angina pectoris: Secondary | ICD-10-CM | POA: Diagnosis not present

## 2014-08-27 DIAGNOSIS — I5042 Chronic combined systolic (congestive) and diastolic (congestive) heart failure: Secondary | ICD-10-CM | POA: Diagnosis present

## 2014-08-27 DIAGNOSIS — I25110 Atherosclerotic heart disease of native coronary artery with unstable angina pectoris: Secondary | ICD-10-CM | POA: Diagnosis present

## 2014-08-27 DIAGNOSIS — I25700 Atherosclerosis of coronary artery bypass graft(s), unspecified, with unstable angina pectoris: Secondary | ICD-10-CM | POA: Diagnosis present

## 2014-08-27 DIAGNOSIS — E785 Hyperlipidemia, unspecified: Secondary | ICD-10-CM | POA: Diagnosis not present

## 2014-08-27 DIAGNOSIS — Z8249 Family history of ischemic heart disease and other diseases of the circulatory system: Secondary | ICD-10-CM | POA: Diagnosis not present

## 2014-08-27 DIAGNOSIS — Z79899 Other long term (current) drug therapy: Secondary | ICD-10-CM

## 2014-08-27 DIAGNOSIS — Z87891 Personal history of nicotine dependence: Secondary | ICD-10-CM | POA: Diagnosis not present

## 2014-08-27 DIAGNOSIS — I10 Essential (primary) hypertension: Secondary | ICD-10-CM | POA: Diagnosis present

## 2014-08-27 DIAGNOSIS — Z833 Family history of diabetes mellitus: Secondary | ICD-10-CM | POA: Diagnosis not present

## 2014-08-27 DIAGNOSIS — I249 Acute ischemic heart disease, unspecified: Secondary | ICD-10-CM | POA: Diagnosis present

## 2014-08-27 HISTORY — DX: Chronic combined systolic (congestive) and diastolic (congestive) heart failure: I50.42

## 2014-08-27 HISTORY — DX: Peripheral vascular disease, unspecified: I73.9

## 2014-08-27 HISTORY — DX: Hyperlipidemia, unspecified: E78.5

## 2014-08-27 HISTORY — DX: Type 2 diabetes mellitus without complications: E11.9

## 2014-08-27 LAB — BASIC METABOLIC PANEL
Anion gap: 12 (ref 5–15)
BUN: 14 mg/dL (ref 6–20)
CHLORIDE: 93 mmol/L — AB (ref 101–111)
CO2: 22 mmol/L (ref 22–32)
CREATININE: 1.09 mg/dL (ref 0.61–1.24)
Calcium: 9.4 mg/dL (ref 8.9–10.3)
GFR calc Af Amer: >60 mL/min (ref >60)
GFR calc non Af Amer: >60 mL/min (ref >60)
GLUCOSE: 195 mg/dL — AB (ref 65–99)
Potassium: 4.4 mmol/L (ref 3.5–5.1)
Sodium: 127 mmol/L — ABNORMAL LOW (ref 135–145)

## 2014-08-27 LAB — CBC WITH DIFFERENTIAL/PLATELET
Basophils Absolute: 0 10*3/uL (ref 0.0–0.1)
Basophils Relative: 0 % (ref 0–1)
EOS ABS: 0.1 10*3/uL (ref 0.0–0.7)
Eosinophils Relative: 1 % (ref 0–5)
HEMATOCRIT: 43.3 % (ref 39.0–52.0)
Hemoglobin: 15.4 g/dL (ref 13.0–17.0)
LYMPHS ABS: 1.7 10*3/uL (ref 0.7–4.0)
Lymphocytes Relative: 19 % (ref 12–46)
MCH: 31.1 pg (ref 26.0–34.0)
MCHC: 35.6 g/dL (ref 30.0–36.0)
MCV: 87.5 fL (ref 78.0–100.0)
MONOS PCT: 8 % (ref 3–12)
Monocytes Absolute: 0.8 10*3/uL (ref 0.1–1.0)
NEUTROS PCT: 72 % (ref 43–77)
Neutro Abs: 6.6 10*3/uL (ref 1.7–7.7)
Platelets: 180 10*3/uL (ref 150–400)
RBC: 4.95 MIL/uL (ref 4.22–5.81)
RDW: 12.5 % (ref 11.5–15.5)
WBC: 9.3 10*3/uL (ref 4.0–10.5)

## 2014-08-27 LAB — TROPONIN I
TROPONIN I: 0.06 ng/mL — AB (ref <0.031)
TROPONIN I: 0.29 ng/mL — AB (ref <0.031)

## 2014-08-27 LAB — HEPARIN LEVEL (UNFRACTIONATED): Heparin Unfractionated: 0.42 IU/mL (ref 0.30–0.70)

## 2014-08-27 LAB — GLUCOSE, CAPILLARY: Glucose-Capillary: 148 mg/dL — ABNORMAL HIGH (ref 65–99)

## 2014-08-27 LAB — I-STAT TROPONIN, ED: Troponin i, poc: 0.06 ng/mL (ref 0.00–0.08)

## 2014-08-27 MED ORDER — CLOPIDOGREL BISULFATE 75 MG PO TABS
75.0000 mg | ORAL_TABLET | Freq: Every day | ORAL | Status: DC
  Administered 2014-08-28 – 2014-08-29 (×2): 75 mg via ORAL
  Filled 2014-08-27 (×2): qty 1

## 2014-08-27 MED ORDER — ASPIRIN 81 MG PO CHEW: 324.0000 mg | CHEWABLE_TABLET | Freq: Once | ORAL | Status: DC

## 2014-08-27 MED ORDER — NITROGLYCERIN 0.4 MG SL SUBL
0.4000 mg | SUBLINGUAL_TABLET | SUBLINGUAL | Status: DC | PRN
  Administered 2014-08-28: 0.4 mg via SUBLINGUAL
  Filled 2014-08-27: qty 1

## 2014-08-27 MED ORDER — DILTIAZEM HCL ER COATED BEADS 420 MG PO TB24: 420.0000 mg | ORAL_TABLET | Freq: Every day | ORAL | Status: DC

## 2014-08-27 MED ORDER — ASPIRIN EC 81 MG PO TBEC: 81.0000 mg | DELAYED_RELEASE_TABLET | Freq: Every day | ORAL | Status: DC

## 2014-08-27 MED ORDER — DILTIAZEM HCL ER COATED BEADS 240 MG PO CP24
420.0000 mg | ORAL_CAPSULE | Freq: Every day | ORAL | Status: DC
  Administered 2014-08-28: 420 mg via ORAL
  Filled 2014-08-27 (×2): qty 1

## 2014-08-27 MED ORDER — SODIUM CHLORIDE 0.9 % IJ SOLN: 3.0000 mL | INTRAMUSCULAR | Status: DC | PRN

## 2014-08-27 MED ORDER — VITAMIN D 1000 UNITS PO TABS
1000.0000 [IU] | ORAL_TABLET | Freq: Every day | ORAL | Status: DC
  Administered 2014-08-28 – 2014-08-29 (×2): 1000 [IU] via ORAL
  Filled 2014-08-27 (×2): qty 1

## 2014-08-27 MED ORDER — HEPARIN (PORCINE) IN NACL 100-0.45 UNIT/ML-% IJ SOLN
1000.0000 [IU]/h | INTRAMUSCULAR | Status: DC
Start: 2014-08-27 — End: 2014-08-28
  Administered 2014-08-27: 1000 [IU]/h via INTRAVENOUS
  Filled 2014-08-27: qty 250

## 2014-08-27 MED ORDER — ISOSORBIDE MONONITRATE ER 60 MG PO TB24
120.0000 mg | ORAL_TABLET | Freq: Every day | ORAL | Status: DC
  Administered 2014-08-28: 120 mg via ORAL
  Filled 2014-08-27 (×2): qty 2

## 2014-08-27 MED ORDER — ALPRAZOLAM 0.25 MG PO TABS: 0.2500 mg | ORAL_TABLET | Freq: Three times a day (TID) | ORAL | Status: DC | PRN

## 2014-08-27 MED ORDER — INSULIN ASPART 100 UNIT/ML ~~LOC~~ SOLN
0.0000 [IU] | Freq: Three times a day (TID) | SUBCUTANEOUS | Status: DC
  Administered 2014-08-28: 3 [IU] via SUBCUTANEOUS

## 2014-08-27 MED ORDER — SODIUM CHLORIDE 0.9 % IJ SOLN
3.0000 mL | Freq: Two times a day (BID) | INTRAMUSCULAR | Status: DC
  Administered 2014-08-28: 3 mL via INTRAVENOUS

## 2014-08-27 MED ORDER — ONDANSETRON HCL 4 MG/2ML IJ SOLN: 4.0000 mg | Freq: Four times a day (QID) | INTRAMUSCULAR | Status: DC | PRN

## 2014-08-27 MED ORDER — FLUTICASONE PROPIONATE 50 MCG/ACT NA SUSP: 2.0000 | Freq: Every day | NASAL | Status: DC | PRN

## 2014-08-27 MED ORDER — ACETAMINOPHEN 325 MG PO TABS: 650.0000 mg | ORAL_TABLET | ORAL | Status: DC | PRN

## 2014-08-27 MED ORDER — METOPROLOL SUCCINATE ER 50 MG PO TB24
50.0000 mg | ORAL_TABLET | Freq: Every day | ORAL | Status: DC
  Administered 2014-08-28 – 2014-08-29 (×2): 50 mg via ORAL
  Filled 2014-08-27 (×2): qty 1

## 2014-08-27 MED ORDER — HEPARIN BOLUS VIA INFUSION
4000.0000 [IU] | Freq: Once | INTRAVENOUS | Status: AC
  Administered 2014-08-27: 4000 [IU] via INTRAVENOUS
  Filled 2014-08-27: qty 4000

## 2014-08-27 MED ORDER — ATORVASTATIN CALCIUM 80 MG PO TABS
80.0000 mg | ORAL_TABLET | Freq: Every day | ORAL | Status: DC
  Administered 2014-08-27 – 2014-08-28 (×2): 80 mg via ORAL
  Filled 2014-08-27 (×2): qty 1

## 2014-08-27 MED ORDER — QUINAPRIL HCL 10 MG PO TABS
20.0000 mg | ORAL_TABLET | Freq: Every day | ORAL | Status: DC
  Administered 2014-08-27 – 2014-08-28 (×2): 20 mg via ORAL
  Filled 2014-08-27 (×3): qty 2

## 2014-08-27 MED ORDER — SODIUM CHLORIDE 0.9 % IV SOLN: 250.0000 mL | INTRAVENOUS | Status: DC | PRN

## 2014-08-27 MED ORDER — NITROGLYCERIN 0.4 MG SL SUBL: 0.4000 mg | SUBLINGUAL_TABLET | SUBLINGUAL | Status: DC | PRN

## 2014-08-27 NOTE — ED Provider Notes (Signed)
CSN: [id number]     Arrival date & time 08/27/14  1212 History   First MD Initiated Contact with Patient 08/27/14 1216     Chief Complaint  Patient presents with  . Chest Pain     (Consider location/radiation/quality/duration/timing/severity/associated sxs/prior Treatment) Patient is a 64 y.o. male presenting with chest pain. The history is provided by the patient.  Chest Pain Pain location:  Substernal area Pain quality: crushing and sharp   Pain radiates to:  Does not radiate Pain radiates to the back: no   Pain severity:  Moderate Onset quality:  Sudden Duration:  4 hours Timing:  Constant Progression:  Waxing and waning Chronicity:  New Relieved by:  Rest and nitroglycerin Worsened by:  Movement and exertion Ineffective treatments:  None tried Associated symptoms: diaphoresis   Associated symptoms: no abdominal pain, no fever, no headache, no nausea, no palpitations, no shortness of breath and not vomiting   Risk factors: coronary artery disease and diabetes mellitus    64 year old male with a chief complaint chest pain. This feels like his prior coronary events. Patient had a 5 way bypass as well as stent. 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 He took one nitroglycerin onset down with some relief. Patient went to go do a separate activity had recurrence of the pain. Patient took another nitroglycerin and had improvement, then went to do a third activity and had recurrence of pain again patient took third nitroglycerin and called EMS. Took asa 325 at home.   Past Medical History  Diagnosis Date  . Coronary artery disease     a. s/p MI in 1989;  b. 04/2008 CABG x 5 (LIMA->LAD, VG->D1, VG->RI, VG->PDA, L Radial->OM2);  c. 06/2010 PCI/DES to the Diag (2.25x28 Promus Element DES). VG->Diag occluded;  d. 08/2010 Attempted PTCA of the OM2 via the free radial graft. Unsuccessful.  Lesion felt to  be 2/2 suture.  . Type II diabetes mellitus   . Hypertension   . Claudication     a. 08/2014 ABI: R 0.79, L 1.0-->Walking program instituted.  . Chronic diastolic CHF (congestive heart failure)   . Hyperlipidemia   . Chest pain     a. experience weekly, nitrate responsive c/p x several years.   Past Surgical History  Procedure Laterality Date  . Coronary artery bypass graft    . Coronary angioplasty with stent placement    . Shoulder surgery Right 7-8 years ago    Dr. Dorna Leitz  . Colonoscopy    . Shoulder arthroscopy with rotator cuff repair and subacromial decompression Left 05/23/2013    Procedure: LEFT SHOULDER ARTHROSCOPY SUBACROMIAL  DECOMPRESSION DISTAL CLAVICLE RESECTION AND ROTATOR CUFF REPAIR ;  Surgeon: Marin Shutter, MD;  Location: [place];  Service: Orthopedics;  Laterality: Left;   Family History  Problem Relation Age of Onset  . Heart Problems Brother     heart valve replacement  . Anesthesia problems Brother   . Heart disease Mother   . Cancer - Lung Mother   . Heart attack Father   . Heart Problems Sister     had heart valve replacement  . Hypertension Brother   . Diabetes Brother   . Kidney failure Brother   . Heart Problems Brother    Social History  Substance Use Topics  . Smoking status: Former Smoker    Quit date: 01/04/1987  . Smokeless tobacco: Former Systems developer  .  Alcohol Use: 1.2 oz/week    2 Cans of beer per week     Comment: a week    Review of Systems  Constitutional: Positive for diaphoresis. Negative for fever and chills.  HENT: Negative for congestion and facial swelling.   Eyes: Negative for discharge and visual disturbance.  Respiratory: Negative for shortness of breath.   Cardiovascular: Positive for chest pain. Negative for palpitations.  Gastrointestinal: Negative for nausea, vomiting, abdominal pain and diarrhea.  Musculoskeletal: Negative for myalgias and arthralgias.  Skin: Negative for color change and rash.  Neurological:  Negative for tremors, syncope and headaches.  Psychiatric/Behavioral: Negative for confusion and dysphoric mood.      Allergies  Avandia and Codeine  Home Medications   Prior to Admission medications   Medication Sig Start Date End Date Taking? Authorizing Provider  ALPRAZolam (XANAX) 0.25 MG tablet Take 0.25 mg by mouth 3 (three) times daily as needed. For anxiety     Historical Provider, MD  aspirin 325 MG tablet Take 325 mg by mouth daily.      Historical Provider, MD  atorvastatin (LIPITOR) 40 MG tablet Take 40 mg by mouth daily.    Historical Provider, MD  cholecalciferol (VITAMIN D) 1000 UNITS tablet Take 1,000 Units by mouth daily.    Historical Provider, MD  clopidogrel (PLAVIX) 75 MG tablet Take 75 mg by mouth daily with breakfast.    Historical Provider, MD  desoximetasone (TOPICORT) 0.25 % cream Apply 1 application topically 2 (two) times daily as needed (for itching).    Historical Provider, MD  diltiazem (CARDIZEM LA) 420 MG 24 hr tablet Take 420 mg by mouth daily.    Historical Provider, MD  fluticasone (FLONASE) 50 MCG/ACT nasal spray Place 2 sprays into the nose daily as needed. For nasal congestion      Historical Provider, MD  hydrocortisone cream 1 % Apply 1 application topically 2 (two) times daily as needed (for eczema).    Historical Provider, MD  isosorbide mononitrate (IMDUR) 120 MG 24 hr tablet Take 120 mg by mouth daily.    Historical Provider, MD  metoprolol succinate (TOPROL-XL) 50 MG 24 hr tablet Take 50 mg by mouth daily. Take with or immediately following a meal.    Historical Provider, MD  nitroGLYCERIN (NITROSTAT) 0.4 MG SL tablet Place 0.4 mg under the tongue every 5 (five) minutes x 3 doses as needed. For chest pain    Historical Provider, MD  Polyvinyl Alcohol-Povidone (CLEAR EYES ALL SEASONS OP) Place 1 drop into both eyes daily as needed (for dry eyes).    Historical Provider, MD  quinapril (ACCUPRIL) 20 MG tablet Take 20 mg by mouth at bedtime.     Historical Provider, MD  sitaGLIPtin (JANUVIA) 100 MG tablet Take 100 mg by mouth daily.    Historical Provider, MD   BP 165/89 mmHg  Pulse 69  Temp(Src) 99.1 F (37.3 C) (Oral)  Resp 18  Ht 5' 6.5" (1.689 m)  Wt 174 lb 9.7 oz (79.2 kg)  BMI 27.76 kg/m2  SpO2 96% Physical Exam  Constitutional: He is oriented to person, place, and time. He appears well-developed and well-nourished.  HENT:  Head: Normocephalic and atraumatic.  Eyes: EOM are normal. Pupils are equal, round, and reactive to light.  Neck: Normal range of motion. Neck supple. No JVD present.  Cardiovascular: Normal rate and regular rhythm.  Exam reveals no gallop and no friction rub.   No murmur heard. Pulmonary/Chest: No respiratory distress. He has no wheezes. He  has no rales. He exhibits no tenderness.  Abdominal: He exhibits no distension. There is no tenderness. There is no rebound and no guarding.  Musculoskeletal: Normal range of motion.  Neurological: He is alert and oriented to person, place, and time.  Skin: No rash noted. No pallor.  Psychiatric: He has a normal mood and affect. His behavior is normal.    ED Course  Procedures (including critical care time) Labs Review Labs Reviewed  BASIC METABOLIC PANEL - Abnormal; Notable for the following:    Sodium 127 (*)    Chloride 93 (*)    Glucose, Bld 195 (*)    All other components within normal limits  TROPONIN I - Abnormal; Notable for the following:    Troponin I 0.06 (*)    All other components within normal limits  CBC WITH DIFFERENTIAL/PLATELET  HEPARIN LEVEL (UNFRACTIONATED)  HEPARIN LEVEL (UNFRACTIONATED)  CBC  TROPONIN I  TROPONIN I  TROPONIN I  COMPREHENSIVE METABOLIC PANEL  LIPID PANEL  I-STAT TROPOININ, ED    Imaging Review Dg Chest 2 View  08/27/2014   CLINICAL DATA:  Mid chest pain radiating into the neck with onset yesterday; history of coronary artery disease and previous MI, diabetes.  EXAM: CHEST  2 VIEW  COMPARISON:  Chest  x-ray of May 20, 2013  FINDINGS: The lungs are well-expanded. The heart and pulmonary vascularity are normal. The mediastinum is normal in width. There is no pleural effusion. There are 6 intact sternal wires. The bony thorax is unremarkable.  IMPRESSION: There is no active cardiopulmonary disease.   Electronically Signed   By: David  Martinique M.D.   On: 08/27/2014 13:36   I have personally reviewed and evaluated these images and lab results as part of my medical decision-making.   EKG Interpretation   Date/Time:  Wednesday August 27 2014 12:19:46 EDT Ventricular Rate:  77 PR Interval:  159 QRS Duration: 78 QT Interval:  381 QTC Calculation: 431 R Axis:   44 Text Interpretation:  Sinus rhythm Borderline repolarization abnormality  Non-specific ST-t changes Confirmed by Joyel Chenette MD, Quillian Quince ([id number]) on  08/27/2014 6:21:59 PM      MDM   Final diagnoses:  Acute coronary syndrome  Ischemic chest pain    64 yo with chest pain. Feels similar to prior chest pains. Concern for inducible ischemia. Will obtain troponin, consult cardiology.  Positive trop, patient continues to be pain free.  Heparin gtt ordered.  Mild hyponatremia and hypochloridemia.  CXR without acute process.   CRITICAL CARE Performed by: Cecilio Asper   Total critical care time: 30 min  Critical care time was exclusive of separately billable procedures and treating other patients.  Critical care was necessary to treat or prevent imminent or life-threatening deterioration.  Critical care was time spent personally by me on the following activities: development of treatment plan with patient and/or surrogate as well as nursing, discussions with consultants, evaluation of patient's response to treatment, examination of patient, obtaining history from patient or surrogate, ordering and performing treatments and interventions, ordering and review of laboratory studies, ordering and review of radiographic studies, pulse  oximetry and re-evaluation of patient's condition.   The patients results and plan were reviewed and discussed.   Any x-rays performed were independently reviewed by myself.   Differential diagnosis were considered with the presenting HPI.  Medications  nitroGLYCERIN (NITROSTAT) SL tablet 0.4 mg (not administered)  heparin ADULT infusion 100 units/mL (25000 units/250 mL) (1,000 Units/hr Intravenous New Bag/Given 08/27/14 1433)  clopidogrel (  PLAVIX) tablet 75 mg (not administered)  diltiazem (CARDIZEM LA) 24 hr tablet 420 mg (not administered)  isosorbide mononitrate (IMDUR) 24 hr tablet 120 mg (not administered)  metoprolol succinate (TOPROL-XL) 24 hr tablet 50 mg (not administered)  quinapril (ACCUPRIL) tablet 20 mg (not administered)  atorvastatin (LIPITOR) tablet 80 mg (not administered)  cholecalciferol (VITAMIN D) tablet 1,000 Units (not administered)  ALPRAZolam (XANAX) tablet 0.25 mg (not administered)  fluticasone (FLONASE) 50 MCG/ACT nasal spray 2 spray (not administered)  nitroGLYCERIN (NITROSTAT) SL tablet 0.4 mg (not administered)  aspirin EC tablet 81 mg (not administered)  acetaminophen (TYLENOL) tablet 650 mg (not administered)  ondansetron (ZOFRAN) injection 4 mg (not administered)  sodium chloride 0.9 % injection 3 mL (not administered)  sodium chloride 0.9 % injection 3 mL (not administered)  0.9 %  sodium chloride infusion (not administered)  insulin aspart (novoLOG) injection 0-9 Units (not administered)  heparin bolus via infusion 4,000 Units (4,000 Units Intravenous Given 08/27/14 1433)    Filed Vitals:   08/27/14 1600 08/27/14 1630 08/27/14 1700 08/27/14 1814  BP: 140/76 136/72 125/67 165/89  Pulse: 75 69 65 69  Temp:    99.1 F (37.3 C)  TempSrc:    Oral  Resp: '17 12 17 18  '$ Height:    5' 6.5" (1.689 m)  Weight:    174 lb 9.7 oz (79.2 kg)  SpO2: 99% 99% 95% 96%    Final diagnoses:  Acute coronary syndrome  Ischemic chest pain    Admission/  observation were discussed with the admitting physician, patient and/or family and they are comfortable with the plan.      Deno Etienne, DO 08/27/14 Vernelle Emerald

## 2014-08-27 NOTE — H&P (Signed)
Patient ID: [name] [name] MRN: [id number], DOB/AGE: 1950-03-17   Admit date: 08/27/2014   Primary Physician: Irven Shelling, MD Primary Cardiologist: Linard Millers, MD   Pt. Profile:  64 y/o male with a h/o CAD and coronary artery bypass grafting in 2010, who presented to the ED this morning with recurrent, nitrate responsive chest pain.  Problem List  Past Medical History  Diagnosis Date  . Coronary artery disease     a. s/p MI in 1989;  b. 04/2008 CABG x 5 (LIMA->LAD, VG->D1, VG->RI, VG->PDA, L Radial->OM2);  c. 06/2010 PCI/DES to the Diag (2.25x28 Promus Element DES). VG->Diag occluded;  d. 08/2010 Attempted PTCA of the OM2 via the free radial graft. Unsuccessful.  Lesion felt to be 2/2 suture.  . Type II diabetes mellitus   . Hypertension   . Claudication     a. 08/2014 ABI: R 0.79, L 1.0-->Walking program instituted.  . Chronic diastolic CHF (congestive heart failure)   . Hyperlipidemia   . Chest pain     a. experience weekly, nitrate responsive c/p x several years.    Past Surgical History  Procedure Laterality Date  . Coronary artery bypass graft    . Coronary angioplasty with stent placement    . Shoulder surgery Right 7-8 years ago    Dr. Dorna Leitz  . Colonoscopy    . Shoulder arthroscopy with rotator cuff repair and subacromial decompression Left 05/23/2013    Procedure: LEFT SHOULDER ARTHROSCOPY SUBACROMIAL  DECOMPRESSION DISTAL CLAVICLE RESECTION AND ROTATOR CUFF REPAIR ;  Surgeon: Marin Shutter, MD;  Location: [place];  Service: Orthopedics;  Laterality: Left;     Allergies  Allergies  Allergen Reactions  . Avandia [Rosiglitazone] Other (See Comments)    Weight gain  . Codeine Itching, Swelling and Other (See Comments)    Burning and heart flutter dizziness    HPI  64 year old male with prior history of coronary artery disease status post coronary artery bypass grafting in 2010. He subsequently underwent drilling stent placement to the native diagonal  secondary to occlusion of the vein graft to the diagonal in June 2012. There was also attempted PCI of the second obtuse marginal via the left radial graft in August 2012, which was unsuccessful. The lesion at that time was felt to be secondary to a suture. Over the past several years, patient has been experiencing intermittent, exertional, substernal chest pressure, approximately once a week, lasting 1-2 minutes, and resolving with nitroglycerin. 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 Over the past several months, he has been experiencing bilateral lower extremity claudication. This has improved some with increasing activity and walking. He saw Dr. Fletcher Anon on August 9, and ABIs were performed and showed an ABI of 0.79 on the right, and 1.0 on the left. He was provided a prescription for walking with plan to follow-up symptoms in one month. He says that since that office visit, his claudication has significantly improved.  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 He took nitroglycerin and symptoms resolved within 15 minutes. A little later in the morning, he got up to do something around the house and had recurrent chest discomfort, again requiring sublingual nitroglycerin, and resolving within a few minutes. He had a third episode of chest pressure about 2 hours later at that point he called EMS. He was taken to the [facility] where her ECG  showed minimal lateral ST depression. Initial troponin was mildly elevated at 0.06. Labs were otherwise unrevealing. We have been asked to evaluate. He is currently chest pain-free.  Home Medications  Prior to Admission medications   Medication Sig Start Date End Date Taking? Authorizing Provider  ALPRAZolam (XANAX) 0.25 MG tablet Take 0.25 mg by mouth 3 (three) times daily as needed. For anxiety     Historical Provider, MD  aspirin 325 MG tablet Take 325 mg by mouth  daily.      Historical Provider, MD  atorvastatin (LIPITOR) 40 MG tablet Take 40 mg by mouth daily.    Historical Provider, MD  cholecalciferol (VITAMIN D) 1000 UNITS tablet Take 1,000 Units by mouth daily.    Historical Provider, MD  clopidogrel (PLAVIX) 75 MG tablet Take 75 mg by mouth daily with breakfast.    Historical Provider, MD  desoximetasone (TOPICORT) 0.25 % cream Apply 1 application topically 2 (two) times daily as needed (for itching).    Historical Provider, MD  diltiazem (CARDIZEM LA) 420 MG 24 hr tablet Take 420 mg by mouth daily.    Historical Provider, MD  fluticasone (FLONASE) 50 MCG/ACT nasal spray Place 2 sprays into the nose daily as needed. For nasal congestion      Historical Provider, MD  hydrocortisone cream 1 % Apply 1 application topically 2 (two) times daily as needed (for eczema).    Historical Provider, MD  isosorbide mononitrate (IMDUR) 120 MG 24 hr tablet Take 120 mg by mouth daily.    Historical Provider, MD  metoprolol succinate (TOPROL-XL) 50 MG 24 hr tablet Take 50 mg by mouth daily. Take with or immediately following a meal.    Historical Provider, MD  nitroGLYCERIN (NITROSTAT) 0.4 MG SL tablet Place 0.4 mg under the tongue every 5 (five) minutes x 3 doses as needed. For chest pain    Historical Provider, MD  Polyvinyl Alcohol-Povidone (CLEAR EYES ALL SEASONS OP) Place 1 drop into both eyes daily as needed (for dry eyes).    Historical Provider, MD  quinapril (ACCUPRIL) 20 MG tablet Take 20 mg by mouth at bedtime.    Historical Provider, MD  sitaGLIPtin (JANUVIA) 100 MG tablet Take 100 mg by mouth daily.    Historical Provider, MD    Family History  Family History  Problem Relation Age of Onset  . Heart Problems Brother     heart valve replacement  . Anesthesia problems Brother   . Heart disease Mother   . Cancer - Lung Mother   . Heart attack Father   . Heart Problems Sister     had heart valve replacement  . Hypertension Brother   . Diabetes  Brother   . Kidney failure Brother   . Heart Problems Brother     Social History  Social History   Social History  . Marital Status: Married    Spouse Name: N/A  . Number of Children: N/A  . Years of Education: N/A   Occupational History  . Not on file.   Social History Main Topics  . Smoking status: Former Smoker    Quit date: 01/04/1987  . Smokeless tobacco: Former Systems developer  . Alcohol Use: 1.2 oz/week    2 Cans of beer per week     Comment: a week  . Drug Use: No  . Sexual Activity: Not on file   Other Topics Concern  . Not on file   Social History Narrative   Lives in [place] with his wife.  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    Review of Systems General:  No chills, fever, night sweats or weight changes.  Cardiovascular:  Positive chest pain, no dyspnea on exertion, edema, orthopnea, palpitations, paroxysmal nocturnal dyspnea. Positive claudication, right greater than left -improving. Dermatological: No rash, lesions/masses Respiratory: No cough, dyspnea Urologic: No hematuria, dysuria Abdominal:   No nausea, vomiting, diarrhea, bright red blood per rectum, melena, or hematemesis Neurologic:  No visual changes, wkns, changes in mental status. All other systems reviewed and are otherwise negative except as noted above.  Physical Exam  Blood pressure 133/75, pulse 73, temperature 98.3 F (36.8 C), temperature source Oral, resp. rate 13, height 5' 6.5" (1.689 m), weight 176 lb (79.833 kg), SpO2 98 %.  General: Pleasant, NAD Psych: Normal affect. Neuro: Alert and oriented X 3. Moves all extremities spontaneously. HEENT: Normal  Neck: Supple without bruits or JVD. Lungs:  Resp regular and unlabored, CTA. Heart: RRR no s3, s4, or murmurs. Abdomen: Soft, non-tender, non-distended, BS + x 4.  Extremities: No clubbing, cyanosis or edema. DP/PT/Radials 1 + and equal bilaterally. No femoral bruits.  Labs  Troponin [facility] Test)  Recent Labs  08/27/14 1302    TROPIPOC 0.06    Recent Labs  08/27/14 1200  TROPONINI 0.06*   Lab Results  Component Value Date   WBC 9.3 08/27/2014   HGB 15.4 08/27/2014   HCT 43.3 08/27/2014   MCV 87.5 08/27/2014   PLT 180 08/27/2014     Recent Labs Lab 08/27/14 1200  NA 127*  K 4.4  CL 93*  CO2 22  BUN 14  CREATININE 1.09  CALCIUM 9.4  GLUCOSE 195*   Radiology/Studies  Dg Chest 2 View  08/27/2014   CLINICAL DATA:  Mid chest pain radiating into the neck with onset yesterday; history of coronary artery disease and previous MI, diabetes.  EXAM: CHEST  2 VIEW  COMPARISON:  Chest x-ray of May 20, 2013  FINDINGS: The lungs are well-expanded. The heart and pulmonary vascularity are normal. The mediastinum is normal in width. There is no pleural effusion. There are 6 intact sternal wires. The bony thorax is unremarkable.  IMPRESSION: There is no active cardiopulmonary disease.   Electronically Signed   By: David  Martinique M.D.   On: 08/27/2014 13:36    ECG  Regular sinus rhythm, 77, minimal lateral ST depression.  ASSESSMENT AND PLAN  1. Unstable angina/acute chronic syndrome/coronary artery disease: Patient has a history of coronary artery disease dating back to 1989 with subsequent interventions and then CABG 5 in 2010. His most recent status post PCI undergoing stent placement to the native diagonal in 2012. He has known stenosis within the second obtuse marginal which is felt to be related to a suture. He has chronic stable angina, expressing exertional chest discomfort approximately once per week, which resolves quickly with nitrates. This morning, he had 3 episodes of exertional substernal chest pressure, which has been nitrate responsive. He is currently chest pain-free. His troponin is mildly elevated at 0.06. We will plan to admit continue cycle cardiac markers. Continue aspirin, statin, Plavix, beta blocker, ACE inhibitor, and nitrate therapy.  Plan on performing diagnostic catheterization in the  a.m.  The patient understands that risks include but are not limited to stroke (1 in 1000), death (1 in 80), kidney failure [usually temporary] (1 in 500), bleeding (1 in 200), allergic reaction [possibly serious] (1 in 200), and agrees to proceed.   2. Essential hypertension: Stable. Continue beta  blocker, calcium channel blocker, and ACE inhibitor therapy. Next  3. Hyperlipidemia: Follow-up lipids and LFTs. Continue statin therapy.  4. Type 2 diabetes mellitus: Continue Januvia. As sliding scale insulin. Patient reports a sensitivity to sliding scale. Use sensitive scale.  5. Chronic diastolic congestive heart failure: Euvolemic on exam. Heart rate and blood pressure stable.   Signed, Murray Hodgkins, NP 08/27/2014, 3:06 PM Patient seen and examined. I agree with the assessment and plan as detailed above. See also my additional thoughts below.   I have reviewed all of the information with Mr. Sharolyn Douglas. I spoke with the patient and his family. He has known significant coronary disease. He does have chest pain that is infrequent. Today he had several recurring episodes, and this is unusual for him. We are admitting him with IV heparin and will proceed with cardiac catheterization tomorrow.  Dola Argyle, MD, [facility] 08/27/2014 4:49 PM

## 2014-08-27 NOTE — Progress Notes (Signed)
ANTICOAGULATION CONSULT NOTE - Initial Consult  Pharmacy Consult for heparin Indication: chest pain/ACS  Allergies  Allergen Reactions  . Avandia [Rosiglitazone] Other (See Comments)    Weight gain  . Codeine Itching, Swelling and Other (See Comments)    Burning and heart flutter dizziness    Patient Measurements: Height: 5' 6.5" (168.9 cm) Weight: 176 lb (79.833 kg) IBW/kg (Calculated) : 64.95 Heparin Dosing Weight: ~ 80 kg  Vital Signs: Temp: 98.3 F (36.8 C) (08/24 1229) Temp Source: Oral (08/24 1229) BP: 145/74 mmHg (08/24 1400) Pulse Rate: 76 (08/24 1400)  Labs:  Recent Labs  08/27/14 1200  HGB 15.4  HCT 43.3  PLT 180  CREATININE 1.09  TROPONINI 0.06*    Estimated Creatinine Clearance: 66.9 mL/min (by C-G formula based on Cr of 1.09).   Medical History: Past Medical History  Diagnosis Date  . Coronary artery disease   . Diabetes mellitus   . Hypertension   . Myocardial infarction   . Chest pain     Pt reports chest pain at least weekly. Reports that Dr. Daneen Schick is aware.   . Anginal pain    Assessment: 64 yo M with CP, r/o MI.  Wt 79.8 kg, CBC wnl, Trop 0.06; creat 1.09.  Not on anticoagulants PTA.  Goal of Therapy:  Heparin level 0.3-0.7 units/ml Monitor platelets by anticoagulation protocol: Yes   Plan:  Give 4000 units bolus x 1 Start heparin infusion at 1000 units/hr Check anti-Xa level in 6 hours and daily while on heparin Continue to monitor H&H and platelets  Eudelia Bunch, Pharm.D. [phone] 08/27/2014 2:18 PM

## 2014-08-27 NOTE — ED Notes (Signed)
Per ems- pt comes from home this morning at 0700 was feeding the horses when he developed central cp radiating into left arm and neck. Took 1 nitro that relieved it. Happened again, took another nitro with relief and called EMS. Has hx of MI with 1 stent. Took 325 aspirin, given 100 cc NS, total 3 nitro, 1 nitro spray. No Pain at current. BP 162/80, HR 70, 100% RA.

## 2014-08-27 NOTE — Interval H&P Note (Signed)
Cath Lab Visit (complete for each Cath Lab visit)  Clinical Evaluation Leading to the Procedure:   ACS: Yes.    Non-ACS:    Anginal Classification: CCS IV  Anti-ischemic medical therapy: Maximal Therapy (2 or more classes of medications)  Non-Invasive Test Results: No non-invasive testing performed  Prior CABG: Previous CABG      History and Physical Interval Note:  08/27/2014 8:31 PM Procedure from femoral approach will be required, L> R. [name]  has presented today for surgery, with the diagnosis of cp  The various methods of treatment have been discussed with the patient and family. After consideration of risks, benefits and other options for treatment, the patient has consented to  Procedure(s): Left Heart Cath and Coronary Angiography (N/A) as a surgical intervention .  The patient's history has been reviewed, patient examined, no change in status, stable for surgery.  I have reviewed the patient's chart and labs.  Questions were answered to the patient's satisfaction.     Sinclair Grooms

## 2014-08-27 NOTE — Progress Notes (Signed)
ANTICOAGULATION CONSULT NOTE - Follow-up Consult  Pharmacy Consult for heparin Indication: chest pain/ACS  Allergies  Allergen Reactions  . Avandia [Rosiglitazone] Other (See Comments)    Weight gain  . Codeine Itching, Swelling and Other (See Comments)    Burning and heart flutter dizziness    Patient Measurements: Height: 5' 6.5" (168.9 cm) Weight: 174 lb 9.7 oz (79.2 kg) (scale ) IBW/kg (Calculated) : 64.95 Heparin Dosing Weight: ~ 80 kg  Vital Signs: Temp: 98.5 F (36.9 C) (08/24 2131) Temp Source: Oral (08/24 2131) BP: 137/69 mmHg (08/24 2131) Pulse Rate: 66 (08/24 2131)  Labs:  Recent Labs  08/27/14 1200 08/27/14 1855 08/27/14 2152  HGB 15.4  --   --   HCT 43.3  --   --   PLT 180  --   --   HEPARINUNFRC  --   --  0.42  CREATININE 1.09  --   --   TROPONINI 0.06* 0.29*  --     Estimated Creatinine Clearance: 66.7 mL/min (by C-G formula based on Cr of 1.09).   Assessment: 64 yo M on heparin for r/o ACS. Heparin level therapeutic on 1000 units/hr. No bleeding noted. Plan for cath tomorrow.  Goal of Therapy:  Heparin level 0.3-0.7 units/ml Monitor platelets by anticoagulation protocol: Yes   Plan:  Continue heparin at 1000 units/hr F/u daily heparin level and CBC  Sherlon Handing, PharmD, BCPS Clinical pharmacist, pager [phone] 08/27/2014 11:56 PM

## 2014-08-28 ENCOUNTER — Encounter (HOSPITAL_COMMUNITY)
Admission: EM | Disposition: A | Discharge status: Home / Self Care | Payer: Self-pay | Source: Home / Self Care | Attending: Cardiology

## 2014-08-28 ENCOUNTER — Encounter (HOSPITAL_COMMUNITY): Payer: Self-pay | Admitting: Interventional Cardiology

## 2014-08-28 DIAGNOSIS — E119 Type 2 diabetes mellitus without complications: Secondary | ICD-10-CM

## 2014-08-28 DIAGNOSIS — E785 Hyperlipidemia, unspecified: Secondary | ICD-10-CM

## 2014-08-28 HISTORY — PX: CARDIAC CATHETERIZATION: SHX172

## 2014-08-28 LAB — COMPREHENSIVE METABOLIC PANEL
ALBUMIN: 3.9 g/dL (ref 3.5–5.0)
ALT: 20 U/L (ref 17–63)
ANION GAP: 10 (ref 5–15)
AST: 25 U/L (ref 15–41)
Alkaline Phosphatase: 69 U/L (ref 38–126)
BILIRUBIN TOTAL: 0.7 mg/dL (ref 0.3–1.2)
BUN: 13 mg/dL (ref 6–20)
CHLORIDE: 96 mmol/L — AB (ref 101–111)
CO2: 23 mmol/L (ref 22–32)
Calcium: 9.3 mg/dL (ref 8.9–10.3)
Creatinine, Ser: 1.07 mg/dL (ref 0.61–1.24)
GFR calc Af Amer: >60 mL/min (ref >60)
GFR calc non Af Amer: >60 mL/min (ref >60)
GLUCOSE: 148 mg/dL — AB (ref 65–99)
POTASSIUM: 5 mmol/L (ref 3.5–5.1)
SODIUM: 129 mmol/L — AB (ref 135–145)
Total Protein: 7 g/dL (ref 6.5–8.1)

## 2014-08-28 LAB — HEPARIN LEVEL (UNFRACTIONATED): Heparin Unfractionated: 0.52 IU/mL (ref 0.30–0.70)

## 2014-08-28 LAB — CBC
HCT: 42.9 % (ref 39.0–52.0)
Hemoglobin: 15.3 g/dL (ref 13.0–17.0)
MCH: 31 pg (ref 26.0–34.0)
MCHC: 35.7 g/dL (ref 30.0–36.0)
MCV: 86.8 fL (ref 78.0–100.0)
PLATELETS: 167 10*3/uL (ref 150–400)
RBC: 4.94 MIL/uL (ref 4.22–5.81)
RDW: 12.6 % (ref 11.5–15.5)
WBC: 10.4 10*3/uL (ref 4.0–10.5)

## 2014-08-28 LAB — GLUCOSE, CAPILLARY
GLUCOSE-CAPILLARY: 136 mg/dL — AB (ref 65–99)
GLUCOSE-CAPILLARY: 107 mg/dL — AB (ref 65–99)
GLUCOSE-CAPILLARY: 80 mg/dL (ref 65–99)
Glucose-Capillary: 223 mg/dL — ABNORMAL HIGH (ref 65–99)

## 2014-08-28 LAB — PROTIME-INR
INR: 1.1 (ref 0.00–1.49)
Prothrombin Time: 14.4 seconds (ref 11.6–15.2)

## 2014-08-28 LAB — LIPID PANEL
CHOL/HDL RATIO: 4.1 ratio
Cholesterol: 160 mg/dL (ref 0–200)
HDL: 39 mg/dL — AB (ref >40)
LDL CALC: 99 mg/dL (ref 0–99)
Triglycerides: 108 mg/dL (ref <150)
VLDL: 22 mg/dL (ref 0–40)

## 2014-08-28 LAB — POCT ACTIVATED CLOTTING TIME: Activated Clotting Time: 159 seconds

## 2014-08-28 LAB — TROPONIN I
TROPONIN I: 0.38 ng/mL — AB (ref <0.031)
TROPONIN I: 0.46 ng/mL — AB (ref <0.031)

## 2014-08-28 SURGERY — LEFT HEART CATH AND CORS/GRAFTS ANGIOGRAPHY
Anesthesia: LOCAL

## 2014-08-28 MED ORDER — FENTANYL CITRATE (PF) 100 MCG/2ML IJ SOLN
INTRAMUSCULAR | Status: DC | PRN
  Administered 2014-08-28: 50 ug via INTRAVENOUS

## 2014-08-28 MED ORDER — SODIUM CHLORIDE 0.9 % IV SOLN: 250.0000 mL | INTRAVENOUS | Status: DC | PRN

## 2014-08-28 MED ORDER — MIDAZOLAM HCL 2 MG/2ML IJ SOLN
INTRAMUSCULAR | Status: AC
  Filled 2014-08-28: qty 4

## 2014-08-28 MED ORDER — LIDOCAINE HCL (PF) 1 % IJ SOLN
INTRAMUSCULAR | Status: DC | PRN
  Administered 2014-08-28: 15 mL

## 2014-08-28 MED ORDER — SODIUM CHLORIDE 0.9 % WEIGHT BASED INFUSION: 3.0000 mL/kg/h | INTRAVENOUS | Status: AC

## 2014-08-28 MED ORDER — SODIUM CHLORIDE 0.9 % WEIGHT BASED INFUSION
3.0000 mL/kg/h | INTRAVENOUS | Status: DC
  Administered 2014-08-28: 3 mL/kg/h via INTRAVENOUS

## 2014-08-28 MED ORDER — SODIUM CHLORIDE 0.9 % IJ SOLN: 3.0000 mL | INTRAMUSCULAR | Status: DC | PRN

## 2014-08-28 MED ORDER — HEPARIN SODIUM (PORCINE) 1000 UNIT/ML IJ SOLN
INTRAMUSCULAR | Status: DC | PRN
  Administered 2014-08-28: 1500 [IU] via INTRAVENOUS

## 2014-08-28 MED ORDER — SODIUM CHLORIDE 0.9 % IV SOLN
INTRAVENOUS | Status: DC | PRN
  Administered 2014-08-28: 75 mL/h via INTRAVENOUS

## 2014-08-28 MED ORDER — IOHEXOL 350 MG/ML SOLN
INTRAVENOUS | Status: DC | PRN
  Administered 2014-08-28: 50 mL via INTRAVENOUS
  Administered 2014-08-28: 100 mL via INTRAVENOUS
  Administered 2014-08-28: 50 mL via INTRAVENOUS

## 2014-08-28 MED ORDER — ENOXAPARIN SODIUM 40 MG/0.4ML ~~LOC~~ SOLN
40.0000 mg | SUBCUTANEOUS | Status: DC
  Administered 2014-08-29: 40 mg via SUBCUTANEOUS
  Filled 2014-08-28: qty 0.4

## 2014-08-28 MED ORDER — ASPIRIN EC 81 MG PO TBEC
81.0000 mg | DELAYED_RELEASE_TABLET | Freq: Every day | ORAL | Status: DC
  Administered 2014-08-28 – 2014-08-29 (×3): 81 mg via ORAL
  Filled 2014-08-28 (×3): qty 1

## 2014-08-28 MED ORDER — HEPARIN (PORCINE) IN NACL 2-0.9 UNIT/ML-% IJ SOLN
INTRAMUSCULAR | Status: AC
Start: 2014-08-28 — End: 2014-08-28
  Filled 2014-08-28: qty 1000

## 2014-08-28 MED ORDER — ENOXAPARIN SODIUM 40 MG/0.4ML ~~LOC~~ SOLN
40.0000 mg | SUBCUTANEOUS | Status: DC
Start: 2014-08-29 — End: 2014-08-28

## 2014-08-28 MED ORDER — SODIUM CHLORIDE 0.9 % IJ SOLN
3.0000 mL | Freq: Two times a day (BID) | INTRAMUSCULAR | Status: DC
  Administered 2014-08-28: 3 mL via INTRAVENOUS

## 2014-08-28 MED ORDER — LIDOCAINE HCL (PF) 1 % IJ SOLN
INTRAMUSCULAR | Status: AC
  Filled 2014-08-28: qty 30

## 2014-08-28 MED ORDER — ACETAMINOPHEN 325 MG PO TABS: 650.0000 mg | ORAL_TABLET | ORAL | Status: DC | PRN

## 2014-08-28 MED ORDER — MIDAZOLAM HCL 2 MG/2ML IJ SOLN
INTRAMUSCULAR | Status: DC | PRN
  Administered 2014-08-28 (×2): 1 mg via INTRAVENOUS

## 2014-08-28 MED ORDER — FENTANYL CITRATE (PF) 100 MCG/2ML IJ SOLN
INTRAMUSCULAR | Status: AC
  Filled 2014-08-28: qty 4

## 2014-08-28 MED ORDER — ASPIRIN 81 MG PO CHEW: 81.0000 mg | CHEWABLE_TABLET | ORAL | Status: DC

## 2014-08-28 MED ORDER — SODIUM CHLORIDE 0.9 % WEIGHT BASED INFUSION
1.0000 mL/kg/h | INTRAVENOUS | Status: DC
  Administered 2014-08-28 (×2): 1 mL/kg/h via INTRAVENOUS

## 2014-08-28 MED ORDER — LIDOCAINE HCL (PF) 1 % IJ SOLN
INTRAMUSCULAR | Status: DC | PRN
  Administered 2014-08-28: 12:00:00

## 2014-08-28 SURGICAL SUPPLY — 10 items
CATH INFINITI 5 FR IM (CATHETERS) ×2 IMPLANT
CATH INFINITI 5FR JL4 (CATHETERS) ×2 IMPLANT
CATH INFINITI JR4 5F (CATHETERS) ×2 IMPLANT
CATH SITESEER 5F MULTI A 2 (CATHETERS) ×2 IMPLANT
KIT HEART LEFT (KITS) ×2 IMPLANT
PACK CARDIAC CATHETERIZATION (CUSTOM PROCEDURE TRAY) ×2 IMPLANT
SHEATH PINNACLE 5F 10CM (SHEATH) ×2 IMPLANT
TRANSDUCER W/STOPCOCK (MISCELLANEOUS) ×2 IMPLANT
WIRE EMERALD 3MM-J .035X150CM (WIRE) ×2 IMPLANT
WIRE EMERALD 3MM-J .035X260CM (WIRE) ×2 IMPLANT

## 2014-08-28 NOTE — Progress Notes (Signed)
Pt a/o, no c/o pain, pt on hep gtt @ 23m/hr, pt NPO for cath, consent signed and on chart, pt resting comfortably, VSS, pt stable

## 2014-08-28 NOTE — Progress Notes (Signed)
At around 540 pt c/o c/p, PRN nitro given BP 183/63 HR 84 O2 100%, pt stated he had some SOB 2L nasal canula on, post nitro pt stated he's starting to feel better, MD notified

## 2014-08-28 NOTE — Progress Notes (Signed)
Site area: rt groin Site Prior to Removal:  Level 0 Pressure Applied For:  20 minutes Manual:   yes Patient Status During Pull:  stable Post Pull Site:  Level  0 Post Pull Instructions Given:  yes Post Pull Pulses Present: yes Dressing Applied:  tegaderm Bedrest begins @ 1740 Comments:   0

## 2014-08-28 NOTE — Progress Notes (Signed)
Patient Name: [name] [name] Date of Encounter: 08/28/2014     Active Problems:   Acute coronary syndrome    SUBJECTIVE  Had some chest pain this AM when nurses weighing him. Resolved with SL NTG but not as quickly as it usually does. Some residual chest pressure and stomach upset this AM that he thinks is indigestion.   CURRENT MEDS . aspirin  81 mg Oral Pre-Cath  . [START ON 08/29/2014] aspirin EC  81 mg Oral Daily  . atorvastatin  80 mg Oral QHS  . cholecalciferol  1,000 Units Oral Daily  . clopidogrel  75 mg Oral Q breakfast  . diltiazem  420 mg Oral Daily  . insulin aspart  0-9 Units Subcutaneous TID WC  . isosorbide mononitrate  120 mg Oral Daily  . metoprolol succinate  50 mg Oral Daily  . quinapril  20 mg Oral QHS  . sodium chloride  3 mL Intravenous Q12H  . sodium chloride  3 mL Intravenous Q12H    OBJECTIVE  Filed Vitals:   08/27/14 2131 08/28/14 0100 08/28/14 0500 08/28/14 0555  BP: 137/69 130/73 165/80 183/63  Pulse: 66 63 73 82  Temp: 98.5 F (36.9 C) 98.7 F (37.1 C) 98.2 F (36.8 C)   TempSrc: Oral Oral Oral   Resp: '18 18 18   '$ Height:      Weight:   78.427 kg (172 lb 14.4 oz)   SpO2: 98% 100% 100% 100%    Intake/Output Summary (Last 24 hours) at 08/28/14 0922 Last data filed at 08/28/14 0740  Gross per 24 hour  Intake      0 ml  Output      0 ml  Net      0 ml   Filed Weights   08/27/14 1229 08/27/14 1814 08/28/14 0500  Weight: 79.833 kg (176 lb) 79.2 kg (174 lb 9.7 oz) 78.427 kg (172 lb 14.4 oz)    PHYSICAL EXAM  General: Pleasant, NAD. Neuro: Alert and oriented X 3. Moves all extremities spontaneously. Psych: Normal affect. HEENT:  Normal  Neck: Supple without bruits or JVD. Lungs:  Resp regular and unlabored, CTA. Heart: RRR no s3, s4, or murmurs. Abdomen: Soft, non-tender, non-distended, BS + x 4.  Extremities: No clubbing, cyanosis or edema. DP/PT/Radials 2+ and equal bilaterally.  Accessory Clinical Findings  CBC  Recent  Labs  08/27/14 1200 08/28/14 0610  WBC 9.3 10.4  NEUTROABS 6.6  --   HGB 15.4 15.3  HCT 43.3 42.9  MCV 87.5 86.8  PLT 180 578   Basic Metabolic Panel  Recent Labs  08/27/14 1200 08/28/14 0610  NA 127* 129*  K 4.4 5.0  CL 93* 96*  CO2 22 23  GLUCOSE 195* 148*  BUN 14 13  CREATININE 1.09 1.07  CALCIUM 9.4 9.3   Liver Function Tests  Recent Labs  08/28/14 0610  AST 25  ALT 20  ALKPHOS 69  BILITOT 0.7  PROT 7.0  ALBUMIN 3.9   No results for input(s): LIPASE, AMYLASE in the last 72 hours. Cardiac Enzymes  Recent Labs  08/27/14 1855 08/28/14 0015 08/28/14 0610  TROPONINI 0.29* 0.46* 0.38*   Fasting Lipid Panel  Recent Labs  08/28/14 0610  CHOL 160  HDL 39*  LDLCALC 99  TRIG 108  CHOLHDL 4.1    TELE  NSR with some ventricular bigeminy  Radiology/Studies  Dg Chest 2 View  08/27/2014   CLINICAL DATA:  Mid chest pain radiating into the neck with onset yesterday;  history of coronary artery disease and previous MI, diabetes.  EXAM: CHEST  2 VIEW  COMPARISON:  Chest x-ray of May 20, 2013  FINDINGS: The lungs are well-expanded. The heart and pulmonary vascularity are normal. The mediastinum is normal in width. There is no pleural effusion. There are 6 intact sternal wires. The bony thorax is unremarkable.  IMPRESSION: There is no active cardiopulmonary disease.   Electronically Signed   By: David  Martinique M.D.   On: 08/27/2014 13:36    ASSESSMENT AND PLAN  1. NSTEMI: Troponin slightly elevated and trending downwards 0.29--> 0.46--> 0.38.  Patient has a history of coronary artery disease dating back to 1989 with subsequent interventions and then CABG 5 in 2010. His most recent status post PCI undergoing stent placement to the native diagonal in 2012. He has known stenosis within the second obtuse marginal which is felt to be related to a suture. He has chronic stable angina, expressing exertional chest discomfort approximately once per week, which resolves  quickly with nitrates.  -- Continue aspirin, statin, Plavix, beta blocker, ACE inhibitor, and nitrate therapy.  Plan on performing diagnostic catheterization today with his primary cardiogist, Dr. Tamala Julian  2. Essential hypertension: Stable. Continue beta blocker, calcium channel blocker, and ACE inhibitor therapy. Next  3. Hyperlipidemia: LDL 99. LFTs normal. Continue statin therapy.  4. Type 2 diabetes mellitus: Continue Januvia. Continue sensitive sliding scale insulin.  5. Chronic diastolic congestive heart failure: Euvolemic on exam. Heart rate and blood pressure stable.   Judy Pimple PA-C  Pager [phone]  Attending Note:   The patient was seen and examined.  Agree with assessment and plan as noted above.  Changes made to the above note as needed.  I agree that patient needs to have cath . He has a hx of CABG.  Now presents with angina   Has had his left radial artery harvested for a graft so he will need to have cath from right or left femoral artery   discussed risks, benefits, options / He understands and agrees to proceed.    Thayer Headings, Brooke Bonito., MD, [facility] 08/28/2014, 10:22 AM [address],  [place] Pager [phone]

## 2014-08-28 NOTE — Progress Notes (Signed)
Pt completed bedrest. Pt sits at bedside. R femoral site remains level 0 with no complications. Extremity warm to touch and distal pulses 2+.

## 2014-08-28 NOTE — Progress Notes (Signed)
Pt is back from Cath Lab procedure, Vitals stable, tele monitor continue, pt tolerated liquid and diet is advanced, Site area is assessing continuously, no any sign of bleeding, dorsal and femoral pulses are present, HOB is maintained at 30, Pt is resting comfortably in bed without any sign of SOB and distress.

## 2014-08-29 ENCOUNTER — Encounter (HOSPITAL_COMMUNITY): Payer: Self-pay | Admitting: Physician Assistant

## 2014-08-29 ENCOUNTER — Telehealth: Payer: Self-pay | Admitting: Interventional Cardiology

## 2014-08-29 DIAGNOSIS — I214 Non-ST elevation (NSTEMI) myocardial infarction: Principal | ICD-10-CM

## 2014-08-29 DIAGNOSIS — I25119 Atherosclerotic heart disease of native coronary artery with unspecified angina pectoris: Secondary | ICD-10-CM | POA: Diagnosis present

## 2014-08-29 DIAGNOSIS — I2510 Atherosclerotic heart disease of native coronary artery without angina pectoris: Secondary | ICD-10-CM

## 2014-08-29 DIAGNOSIS — I5042 Chronic combined systolic (congestive) and diastolic (congestive) heart failure: Secondary | ICD-10-CM | POA: Diagnosis present

## 2014-08-29 HISTORY — DX: Non-ST elevation (NSTEMI) myocardial infarction: I21.4

## 2014-08-29 LAB — BASIC METABOLIC PANEL
Anion gap: 7 (ref 5–15)
BUN: 8 mg/dL (ref 6–20)
CALCIUM: 9.1 mg/dL (ref 8.9–10.3)
CHLORIDE: 100 mmol/L — AB (ref 101–111)
CO2: 22 mmol/L (ref 22–32)
CREATININE: 0.94 mg/dL (ref 0.61–1.24)
GFR calc non Af Amer: >60 mL/min (ref >60)
Glucose, Bld: 244 mg/dL — ABNORMAL HIGH (ref 65–99)
Potassium: 4.3 mmol/L (ref 3.5–5.1)
SODIUM: 129 mmol/L — AB (ref 135–145)

## 2014-08-29 LAB — GLUCOSE, CAPILLARY: Glucose-Capillary: 117 mg/dL — ABNORMAL HIGH (ref 65–99)

## 2014-08-29 MED ORDER — RANOLAZINE ER 500 MG PO TB12: 500.0000 mg | ORAL_TABLET | Freq: Two times a day (BID) | ORAL | Status: DC

## 2014-08-29 MED ORDER — ASPIRIN 81 MG PO TABS: 81.0000 mg | ORAL_TABLET | Freq: Every day | ORAL | Status: DC

## 2014-08-29 MED ORDER — RANOLAZINE ER 500 MG PO TB12
500.0000 mg | ORAL_TABLET | Freq: Two times a day (BID) | ORAL | Status: DC
  Administered 2014-08-29: 500 mg via ORAL
  Filled 2014-08-29: qty 1

## 2014-08-29 NOTE — Telephone Encounter (Signed)
TCM call---pt to be discharged from the hospital sometime today.  Will defer this message to triage desktop to call pt on Monday 8/29 for TCM call and follow-up.

## 2014-08-29 NOTE — Discharge Summary (Signed)
Discharge Summary   Patient ID: [name] [name] MRN: [id number], DOB/AGE: [birth year] 66 y.o. Admit date: 08/27/2014 D/C date:     08/29/2014  Primary Cardiologist: Dr. Tamala [name]  Principal Problem:   NSTEMI (non-ST elevated myocardial infarction) Active Problems:   Hypertension   Hyperlipidemia   Chest pain   Diabetes mellitus   Peripheral arterial disease   Coronary artery disease   Chronic combined systolic and diastolic CHF (congestive heart failure)    Admission Dates: 08/27/14-08/29/14 Discharge Diagnosis: NSTEMI s/p LHC with severe native and graft disease. Medical therapy recommended. Ranexa added.   HPI: [name] [name] is a 64 y.o. male with a history of HTN, HLD, PVD, chronic diastolic CHF and CAD s/p CABG who presented to the ED 8/24 with recurrent, nitrate responsive chest pain.  He has a prior history of CAD s/p CABG in 2010. He subsequently underwent drilling stent placement to the native diagonal secondary to occlusion of the vein graft to the diagonal in June 2012. There was also attempted PCI of the second obtuse marginal via the left radial graft in August 2012, which was unsuccessful. The lesion at that time was felt to be secondary to a suture. Over the past several years, patient has been experiencing intermittent, exertional, substernal chest pressure, approximately once a week, lasting 1-2 minutes, and resolving with nitroglycerin. He lives in [facility] and is very active on his horse [place]. Over the past several months, he has been experiencing bilateral lower extremity claudication. This has improved some with increasing activity and walking. He saw Dr. Fletcher [name] on August 9, and ABIs were performed and showed an ABI of 0.79 on the right, and 1.0 on the left. He was provided a prescription for walking with plan to follow-up symptoms in one month. He says that since that office visit, his claudication has significantly improved.  On the morning of admission, the patient awoke  at about 7 AM and walked down to his horses to feed them and developed substernal chest pressure. He took nitroglycerin and symptoms resolved within 15 minutes. A little later in the morning, he got up to do something around the house and had recurrent chest discomfort, again requiring sublingual nitroglycerin, and resolving within a few minutes. He had a third episode of chest pressure about 2 hours later at that point he called EMS. He was taken to the [facility] where her ECG showed minimal lateral ST depression. Initial troponin was mildly elevated at 0.06. Labs were otherwise unrevealing.   Hospital Course  1. NSTEMI: Troponin slightly elevated and trending downwards 0.29--> 0.46--> 0.38.  -- s/p LHC on 08/28/14 which revealed  Severe native vessel disease with 50% distal left main, 80% ostial LAD, total occlusion of the proximal circumflex, 99% stenosis of the ramus intermedius, and total occlusion of the mid RCA. Additionally, the large first diagonal that was stented in 2012 is now totally occluded and represents the only significant change when compared to those images.  Severe bypass graft occlusive disease with total occlusion occlusion of the saphenous vein graft to the ramus intermedius and total occlusion of the saphenous vein graft to the diagonal. The free radial graft to the obtuse marginal remains patent. The saphenous vein graft to the PDA is patent. The LIMA to the LAD is widely patent.  The obtuse marginal distal to the insertion of the free radial contains 90% stenosis. This region represents an non-dilatable lesion from attempted PCI in 2012. It is angiographically unchanged.  The distal  diagonal territory now receives collaterals from the left coronary. The ramus intermedius has sluggish flow and is also collateralized.  Left ventricular dysfunction with akinesis of the mid anterolateral wall. EF 40-50% Recommendations:  Continue medical therapy. -- Continue aspirin, statin,  Plavix, beta blocker, ACE inhibitor, and nitrate therapy.Ranexa '500mg'$  BID added this admission.  -- Groin site stable  2. Essential hypertension: Stable. Continue beta blocker, calcium channel blocker, and ACE inhibitor therapy.   3. Hyperlipidemia: LDL 99. LFTs normal. Continue statin therapy.  4. Type 2 diabetes mellitus: Continue home medications  5. Chronic systolic/diastolic congestive heart failure: -- EF now 40-50% by LV gram on cath this admission  -- Euvolemic on exam. Heart rate and blood pressure stable.  6. PAD: He saw Dr. Fletcher [name] on 08/12/14, and ABIs were performed and showed an ABI of 0.79 on the right, and 1.0 on the left.  -- Continue walking Rx   The patient has had an uncomplicated hospital course and is recovering well. The femoral catheter site is stable. He has been seen by Dr. Acie [name] today and deemed ready for discharge home. All follow-up appointments have been scheduled. Discharge medications are listed below.   Discharge Vitals: Blood pressure 111/59, pulse 54, temperature 98.1 F (36.7 C), temperature source Oral, resp. rate 17, height 5' 6.5" (1.689 m), weight 79.107 kg (174 lb 6.4 oz), SpO2 96 %.  Labs: Lab Results  Component Value Date   WBC 10.4 08/28/2014   HGB 15.3 08/28/2014   HCT 42.9 08/28/2014   MCV 86.8 08/28/2014   PLT 167 08/28/2014     Recent Labs Lab 08/28/14 0610 08/29/14 0936  NA 129* 129*  K 5.0 4.3  CL 96* 100*  CO2 23 22  BUN 13 8  CREATININE 1.07 0.94  CALCIUM 9.3 9.1  PROT 7.0  --   BILITOT 0.7  --   ALKPHOS 69  --   ALT 20  --   AST 25  --   GLUCOSE 148* 244*    Recent Labs  08/27/14 1200 08/27/14 1855 08/28/14 0015 08/28/14 0610  TROPONINI 0.06* 0.29* 0.46* 0.38*   Lab Results  Component Value Date   CHOL 160 08/28/2014   HDL 39* 08/28/2014   LDLCALC 99 08/28/2014   TRIG 108 08/28/2014   Lab Results  Component Value Date   DDIMER 0.41 08/04/2010    Diagnostic Studies/Procedures   Dg Chest 2  View  08/27/2014   CLINICAL DATA:  Mid chest pain radiating into the neck with onset yesterday; history of coronary artery disease and previous MI, diabetes.  EXAM: CHEST  2 VIEW  COMPARISON:  Chest x-ray of May 20, 2013  FINDINGS: The lungs are well-expanded. The heart and pulmonary vascularity are normal. The mediastinum is normal in width. There is no pleural effusion. There are 6 intact sternal wires. The bony thorax is unremarkable.  IMPRESSION: There is no active cardiopulmonary disease.   Electronically Signed   By: David  Martinique M.D.   On: 08/27/2014 13:36   LHC 08/28/14  Indications    NSTEMI (non-ST elevated myocardial infarction) [I21.4 (ICD-10-CM)]   Coronary artery disease involving coronary bypass graft of native heart with unstable angina pectoris [I25.700 (ICD-10-CM)]    Technique and Indications    The right femoral area was sterilely prepped and draped. Intravenous sedation with Versed and fentanyl was administered. 1% Xylocaine was infiltrated to achieve local analgesia. An anterior wall stick was utilized to obtain intra-arterial access. The modified Seldinger technique was used to  place a 50F vascular sheath in the right radial artery. Coronary angiography was done using 5 F multipurpose catheter. Right coronary angiography was performed using a JR4. Left ventriculography was done using the A2 MP catheter and hand injection. Left coronary angiography was performed with a JL 4.0 cm. A 50F internal mammary catheter was used for left internal mammary angiography in exchange for the Judkins right which was used to selectively engage the left subclavian.  The patient tolerated the procedure without complications. Hemostasis is achieved with manual femoral compression.    Conclusion     Severe native vessel disease with 50% distal left main, 80% ostial LAD, total occlusion of the proximal circumflex, 99% stenosis of the ramus intermedius, and total occlusion of the mid RCA.  Additionally, the large first diagonal that was stented in 2012 is now totally occluded and represents the only significant change when compared to those images.  Severe bypass graft occlusive disease with total occlusion occlusion of the saphenous vein graft to the ramus intermedius and total occlusion of the saphenous vein graft to the diagonal. The free radial graft to the obtuse marginal remains patent. The saphenous vein graft to the PDA is patent. The LIMA to the LAD is widely patent.  The obtuse marginal distal to the insertion of the free radial contains 90% stenosis. This region represents an non-dilatable lesion from attempted PCI in 2012. It is angiographically unchanged.  The distal diagonal territory now receives collaterals from the left coronary. The ramus intermedius has sluggish flow and is also collateralized.  Left ventricular dysfunction with akinesis of the mid anterolateral wall. EF 40-50%. Recommendations:  Continue medical therapy.     Discharge Medications     Medication List    TAKE these medications        acetaminophen 500 MG tablet  Commonly known as:  TYLENOL  Take 500 mg by mouth daily as needed for mild pain.     ALPRAZolam 0.25 MG tablet  Commonly known as:  XANAX  Take 0.25 mg by mouth 3 (three) times daily as needed. For anxiety     aspirin 81 MG tablet  Take 1 tablet (81 mg total) by mouth daily.     atorvastatin 40 MG tablet  Commonly known as:  LIPITOR  Take 40 mg by mouth daily at 6 PM.     cholecalciferol 1000 UNITS tablet  Commonly known as:  VITAMIN D  Take 1,000 Units by mouth at bedtime.     CLEAR EYES ALL SEASONS OP  Place 1 drop into both eyes daily as needed (for dry eyes).     clopidogrel 75 MG tablet  Commonly known as:  PLAVIX  Take 75 mg by mouth daily with breakfast.     desoximetasone 0.25 % cream  Commonly known as:  TOPICORT  Apply 1 application topically 2 (two) times daily as needed (for eczema and itching).      diltiazem 420 MG 24 hr tablet  Commonly known as:  CARDIZEM LA  Take 420 mg by mouth at bedtime.     fluticasone 50 MCG/ACT nasal spray  Commonly known as:  FLONASE  Place 2 sprays into the nose daily as needed. For nasal congestion     isosorbide mononitrate 120 MG 24 hr tablet  Commonly known as:  IMDUR  Take 120 mg by mouth daily.     metoprolol succinate 50 MG 24 hr tablet  Commonly known as:  TOPROL-XL  Take 50 mg by mouth daily. Take  with or immediately following a meal.     nitroGLYCERIN 0.4 MG SL tablet  Commonly known as:  NITROSTAT  Place 0.4 mg under the tongue every 5 (five) minutes x 3 doses as needed. For chest pain     quinapril 20 MG tablet  Commonly known as:  ACCUPRIL  Take 20 mg by mouth at bedtime.     ranolazine 500 MG 12 hr tablet  Commonly known as:  RANEXA  Take 1 tablet (500 mg total) by mouth 2 (two) times daily.     sitaGLIPtin 100 MG tablet  Commonly known as:  JANUVIA  Take 100 mg by mouth at bedtime.     VICKS VAPOR INHALER IN  Inhale into the lungs daily as needed.        Disposition   The patient will be discharged in stable condition to home.  Follow-up Information    Follow up with [facility] R, NP On 09/12/2014.   Specialties:  Cardiology, Radiology   Why:  @ 9:30am   Contact information:   [place] [id number] [phone]         Duration of Discharge Encounter: Greater than 30 minutes including physician and PA time.  SignedAngelena Form R PA-C 08/29/2014, 10:59 AM   Attending Note:   The patient was seen and examined.  Agree with assessment and plan as noted above.  Changes made to the above note as needed.  See note from earlier today  Pt has severe diffuse disease Added ranexa  Follow up with Dr. Kathie Rhodes, Brooke Bonito., MD, [facility] 08/29/2014, 11:15 AM [address]  [place] Pager [phone]

## 2014-08-29 NOTE — Discharge Instructions (Signed)

## 2014-08-29 NOTE — Progress Notes (Signed)
Patient Name: [name] [name] Date of Encounter: 08/29/2014     Active Problems:   Acute coronary syndrome    SUBJECTIVE  No more chest pain. He has been up walking around and feeling good. Ready to go home.    CURRENT MEDS . aspirin EC  81 mg Oral Daily  . atorvastatin  80 mg Oral QHS  . cholecalciferol  1,000 Units Oral Daily  . clopidogrel  75 mg Oral Q breakfast  . enoxaparin (LOVENOX) injection  40 mg Subcutaneous Q24H  . insulin aspart  0-9 Units Subcutaneous TID WC  . isosorbide mononitrate  120 mg Oral Daily  . metoprolol succinate  50 mg Oral Daily  . quinapril  20 mg Oral QHS  . sodium chloride  3 mL Intravenous Q12H    OBJECTIVE  Filed Vitals:   08/28/14 1640 08/28/14 2051 08/29/14 0026 08/29/14 0503  BP: 103/53 115/60 109/59 111/59  Pulse: 60 54 59 54  Temp:  97.7 F (36.5 C) 98.1 F (36.7 C) 98.1 F (36.7 C)  TempSrc:  Oral Oral Oral  Resp:  '17 17 17  '$ Height:      Weight:    79.107 kg (174 lb 6.4 oz)  SpO2:  96% 98% 96%    Intake/Output Summary (Last 24 hours) at 08/29/14 0757 Last data filed at 08/28/14 1830  Gross per 24 hour  Intake 988.72 ml  Output      0 ml  Net 988.72 ml   Filed Weights   08/27/14 1814 08/28/14 0500 08/29/14 0503  Weight: 79.2 kg (174 lb 9.7 oz) 78.427 kg (172 lb 14.4 oz) 79.107 kg (174 lb 6.4 oz)    PHYSICAL EXAM  General: Pleasant, NAD. Neuro: Alert and oriented X 3. Moves all extremities spontaneously. Psych: Normal affect. HEENT:  Normal  Neck: Supple without bruits or JVD. Lungs:  Resp regular and unlabored, CTA. Heart: RRR no s3, s4, or murmurs. Abdomen: Soft, non-tender, non-distended, BS + x 4.  Extremities: No clubbing, cyanosis or edema. DP/PT/Radials 2+ and equal bilaterally. Groin site with some mild ecchymosis  Accessory Clinical Findings  CBC  Recent Labs  08/27/14 1200 08/28/14 0610  WBC 9.3 10.4  NEUTROABS 6.6  --   HGB 15.4 15.3  HCT 43.3 42.9  MCV 87.5 86.8  PLT 180 245   Basic  Metabolic Panel  Recent Labs  08/27/14 1200 08/28/14 0610  NA 127* 129*  K 4.4 5.0  CL 93* 96*  CO2 22 23  GLUCOSE 195* 148*  BUN 14 13  CREATININE 1.09 1.07  CALCIUM 9.4 9.3   Liver Function Tests  Recent Labs  08/28/14 0610  AST 25  ALT 20  ALKPHOS 69  BILITOT 0.7  PROT 7.0  ALBUMIN 3.9   No results for input(s): LIPASE, AMYLASE in the last 72 hours. Cardiac Enzymes  Recent Labs  08/27/14 1855 08/28/14 0015 08/28/14 0610  TROPONINI 0.29* 0.46* 0.38*   Fasting Lipid Panel  Recent Labs  08/28/14 0610  CHOL 160  HDL 39*  LDLCALC 99  TRIG 108  CHOLHDL 4.1    TELE  NSR with some sinus brady  Radiology/Studies  Dg Chest 2 View  08/27/2014   CLINICAL DATA:  Mid chest pain radiating into the neck with onset yesterday; history of coronary artery disease and previous MI, diabetes.  EXAM: CHEST  2 VIEW  COMPARISON:  Chest x-ray of May 20, 2013  FINDINGS: The lungs are well-expanded. The heart and pulmonary vascularity are normal. The  mediastinum is normal in width. There is no pleural effusion. There are 6 intact sternal wires. The bony thorax is unremarkable.  IMPRESSION: There is no active cardiopulmonary disease.   Electronically Signed   By: David  Martinique M.D.   On: 08/27/2014 13:36    ASSESSMENT AND PLAN  1. NSTEMI: Troponin slightly elevated and trending downwards 0.29--> 0.46--> 0.38.  -- s/p LHC on 08/28/14 which revealed  Severe native vessel disease with 50% distal left main, 80% ostial LAD, total occlusion of the proximal circumflex, 99% stenosis of the ramus intermedius, and total occlusion of the mid RCA. Additionally, the large first diagonal that was stented in 2012 is now totally occluded and represents the only significant change when compared to those images.  Severe bypass graft occlusive disease with total occlusion occlusion of the saphenous vein graft to the ramus intermedius and total occlusion of the saphenous vein graft to the diagonal.  The free radial graft to the obtuse marginal remains patent. The saphenous vein graft to the PDA is patent. The LIMA to the LAD is widely patent.  The obtuse marginal distal to the insertion of the free radial contains 90% stenosis. This region represents an non-dilatable lesion from attempted PCI in 2012. It is angiographically unchanged.  The distal diagonal territory now receives collaterals from the left coronary. The ramus intermedius has sluggish flow and is also collateralized.  Left ventricular dysfunction with akinesis of the mid anterolateral wall. EF 40-50% Recommendations:  Continue medical therapy. -- Continue aspirin, statin, Plavix, beta blocker, ACE inhibitor, and nitrate therapy.  Will arrange f/u as an outpatient w/ Dr Tamala Julian -- Groin site stable  2. Essential hypertension: Stable. Continue beta blocker, calcium channel blocker, and ACE inhibitor therapy.   3. Hyperlipidemia: LDL 99. LFTs normal. Continue statin therapy.  4. Type 2 diabetes mellitus: Continue home medications  5. Chronic diastolic congestive heart failure: Euvolemic on exam. Heart rate and blood pressure stable.   Judy Pimple PA-C  Pager [phone]   Attending Note:   The patient was seen and examined.  Agree with assessment and plan as noted above.  Changes made to the above note as needed.  Pt has severe native and graft disease. Medical therapy is indicated at this point .  His is on   Plavix Metoprolol Imdur 120  a day  Will add Ranexa 500 bid  Follow up with Dr. Tamala Julian .  Will need an ecg at his next office visit    Ramond Dial., MD, [facility] 08/29/2014, 9:31 AM [address]  [place] Pager [phone]

## 2014-08-29 NOTE — Progress Notes (Signed)
UR completed 

## 2014-08-29 NOTE — Progress Notes (Signed)
Pt a/o, no c/o pain, pt ambulated in hallway, steady gait, VSS, pt stable, pt resting comfortably

## 2014-08-29 NOTE — Telephone Encounter (Signed)
TCM per Janalyn Shy-- 9/9 @ 9:30 w/Laura

## 2014-09-01 NOTE — Telephone Encounter (Signed)
Patient contacted regarding discharge from [facility] on 08/29/14.  Patient understands to follow up with provider Cecilie Kicks, NP on 09/12/14 at [place] Patient understands discharge instructions? yes Patient understands medications and regiment? yes Patient understands to bring all medications to this visit? yes

## 2014-09-10 ENCOUNTER — Encounter: Payer: Self-pay | Admitting: *Deleted

## 2014-09-12 ENCOUNTER — Ambulatory Visit (INDEPENDENT_AMBULATORY_CARE_PROVIDER_SITE_OTHER): Payer: Medicare Other | Admitting: Cardiology

## 2014-09-12 ENCOUNTER — Encounter: Payer: Self-pay | Admitting: Cardiology

## 2014-09-12 VITALS — BP 130/70 | HR 45 | Ht 66.5 in | Wt 179.8 lb

## 2014-09-12 DIAGNOSIS — I5042 Chronic combined systolic (congestive) and diastolic (congestive) heart failure: Secondary | ICD-10-CM | POA: Diagnosis not present

## 2014-09-12 DIAGNOSIS — I25709 Atherosclerosis of coronary artery bypass graft(s), unspecified, with unspecified angina pectoris: Secondary | ICD-10-CM

## 2014-09-12 DIAGNOSIS — R001 Bradycardia, unspecified: Secondary | ICD-10-CM

## 2014-09-12 DIAGNOSIS — I2510 Atherosclerotic heart disease of native coronary artery without angina pectoris: Secondary | ICD-10-CM

## 2014-09-12 DIAGNOSIS — I10 Essential (primary) hypertension: Secondary | ICD-10-CM | POA: Diagnosis not present

## 2014-09-12 DIAGNOSIS — E785 Hyperlipidemia, unspecified: Secondary | ICD-10-CM

## 2014-09-12 MED ORDER — DILTIAZEM HCL ER COATED BEADS 180 MG PO CP24: 360.0000 mg | ORAL_CAPSULE | Freq: Every day | ORAL | Status: DC

## 2014-09-12 MED ORDER — EZETIMIBE 10 MG PO TABS: 10.0000 mg | ORAL_TABLET | Freq: Every day | ORAL | Status: DC

## 2014-09-12 MED ORDER — ATORVASTATIN CALCIUM 40 MG PO TABS: 80.0000 mg | ORAL_TABLET | Freq: Every day | ORAL | Status: DC

## 2014-09-12 NOTE — Patient Instructions (Addendum)
Medication Instructions:  Your physician has recommended you make the following change in your medication:  1.  HOLD the Cardizem (Diltiazem) tonight.  Resume it tomorrow, 09-13-14: 180 mg tablets take 2 at bedtime. 2.  Increase the Lipitor to '80mg'$ .  You can take 2 of the 40 mg tablets at 6 pm until you use all the 40 mgs.  A new rx has been sent to the pharmacy for the new dose change.    Labwork: None ordered  Testing/Procedures: None ordered  Follow-Up: Your physician wants you to keep your follow-up appointment with Dr. Fletcher Anon 09-16-14 @ 9:45 and you will have a repeat EKG at that time.

## 2014-09-12 NOTE — Progress Notes (Signed)
Cardiology Office Note   Date:  09/12/2014   ID:  [name] [name], DOB 08/05/1950, MRN [id number]  PCP:  Irven Shelling, MD  Cardiologist:  Dr. Tamala Julian    Chief Complaint  Patient presents with  . Hospitalization Follow-up    NSTEMI, no chest pain      History of Present Illness: [name] [name] is a 64 y.o. male who presents for post hospitalization from 08/27/14 to 08/28/16 for NSTEMI.   He has a history of CAD s/p CABG in 2010. He subsequently underwent drug eluding stent placement to the native diagonal secondary to occlusion of the vein graft to the diagonal in June 2012. There was also attempted PCI of the second obtuse marginal via the left radial graft in August 2012, which was unsuccessful. The lesion at that time was felt to be secondary to a suture. Over the past several years, patient has been experiencing intermittent, exertional, substernal chest pressure, approximately once a week, lasting 1-2 minutes, and resolving with nitroglycerin. He lives in [facility] and is very [place].  Other hx of claudication with ABI on Rt of 0.79 but with walking the claudication has improved.   He was admitted with chest pain/NSTEMI and pk troponin 0.46. Cardiac cath: s/p LHC on 08/28/14 which revealed  Severe native vessel disease with 50% distal left main, 80% ostial LAD, total occlusion of the proximal circumflex, 99% stenosis of the ramus intermedius, and total occlusion of the mid RCA. Additionally, the large first diagonal that was stented in 2012 is now totally occluded and represents the only significant change when compared to those images.  Severe bypass graft occlusive disease with total occlusion occlusion of the saphenous vein graft to the ramus intermedius and total occlusion of the saphenous vein graft to the diagonal. The free radial graft to the obtuse marginal remains patent. The saphenous vein graft to the PDA is patent. The LIMA to the LAD is widely  patent.  The obtuse marginal distal to the insertion of the free radial contains 90% stenosis. This region represents an non-dilatable lesion from attempted PCI in 2012. It is angiographically unchanged.  The distal diagonal territory now receives collaterals from the left coronary. The ramus intermedius has sluggish flow and is also collateralized.  Left ventricular dysfunction with akinesis of the mid anterolateral wall. EF 40-50% Recommendations:  Continue medical therapy  Since discharge he has not had chest pain or SOB.Marland Kitchen He does feel weakness, fatigue. He is bradycardic today.  He was placed on Ranexa and at first had dizziness which has resolved.       Past Medical History  Diagnosis Date  . Coronary artery disease     a. s/p MI in 1989;  b. 04/2008 CABG x 5 (LIMA->LAD, VG->D1, VG->RI, VG->PDA, L Radial->OM2);  c. 06/2010 PCI/DES to the Diag (2.25x28 Promus Element DES). VG->Diag occluded;  d. 08/2010 Attempted PTCA of the OM2 via the free radial graft. Unsuccessful.  Lesion felt to be 2/2 suture.  c. NSTEMI s/p [facility] with severe native and graph dz; D1 that was stented in 2012 now occulded: Rx therapy recommended  . Type II diabetes mellitus   . Hypertension   . Claudication     a. 08/2014 ABI: R 0.79, L 1.0-->Walking program instituted.  . Chronic combined systolic and diastolic CHF (congestive heart failure)   . Hyperlipidemia   . Chest pain     a. experience weekly, nitrate responsive c/p x several years.   b. ranexa  added on 08/2014 admission     Past Surgical History  Procedure Laterality Date  . Coronary artery bypass graft    . Coronary angioplasty with stent placement    . Shoulder surgery Right 7-8 years ago    Dr. Dorna Leitz  . Colonoscopy    . Shoulder arthroscopy with rotator cuff repair and subacromial decompression Left 05/23/2013    Procedure: LEFT SHOULDER ARTHROSCOPY SUBACROMIAL  DECOMPRESSION DISTAL CLAVICLE RESECTION AND ROTATOR CUFF REPAIR ;  Surgeon: Marin Shutter, MD;  Location: [place];  Service: Orthopedics;  Laterality: Left;  . Cardiac catheterization N/A 08/28/2014    Procedure: Left Heart Cath and Cors/Grafts Angiography;  Surgeon: Belva Crome, MD;  Location: [place];  Service: Cardiovascular;  Laterality: N/A;     Current Outpatient Prescriptions  Medication Sig Dispense Refill  . acetaminophen (TYLENOL) 500 MG tablet Take 500 mg by mouth daily as needed for mild pain.    Marland Kitchen ALPRAZolam (XANAX) 0.25 MG tablet Take 0.25 mg by mouth 3 (three) times daily as needed. For anxiety     . Aromatic Inhalants (VICKS VAPOR INHALER IN) Inhale into the lungs daily as needed (for wheezing).     Marland Kitchen aspirin 81 MG tablet Take 1 tablet (81 mg total) by mouth daily.    Marland Kitchen atorvastatin (LIPITOR) 40 MG tablet Take 2 tablets (80 mg total) by mouth daily at 6 PM. 180 tablet 1  . cholecalciferol (VITAMIN D) 1000 UNITS tablet Take 1,000 Units by mouth at bedtime.     . clopidogrel (PLAVIX) 75 MG tablet Take 75 mg by mouth daily with breakfast.    . desoximetasone (TOPICORT) 0.25 % cream Apply 1 application topically 2 (two) times daily as needed (for eczema and itching).     . fluticasone (FLONASE) 50 MCG/ACT nasal spray Place 2 sprays into the nose daily as needed. For nasal congestion    . isosorbide mononitrate (IMDUR) 120 MG 24 hr tablet Take 120 mg by mouth daily.    . metoprolol succinate (TOPROL-XL) 50 MG 24 hr tablet Take 50 mg by mouth daily. Take with or immediately following a meal.    . nitroGLYCERIN (NITROSTAT) 0.4 MG SL tablet Place 0.4 mg under the tongue every 5 (five) minutes x 3 doses as needed. For chest pain    . Polyvinyl Alcohol-Povidone (CLEAR EYES ALL SEASONS OP) Place 1 drop into both eyes daily as needed (for dry eyes).    . quinapril (ACCUPRIL) 20 MG tablet Take 20 mg by mouth at bedtime.    . ranolazine (RANEXA) 500 MG 12 hr tablet Take 1 tablet (500 mg total) by mouth 2 (two) times daily. 60 tablet 11  . sitaGLIPtin (JANUVIA)  100 MG tablet Take 100 mg by mouth at bedtime.     Marland Kitchen diltiazem (CARDIZEM CD) 180 MG 24 hr capsule Take 2 capsules (360 mg total) by mouth daily. Take 2 tablets at bedtime 180 capsule 0   No current facility-administered medications for this visit.    Allergies:   Avandia and Codeine    Social History:  The patient  reports that he quit smoking about 27 years ago. He has quit using smokeless tobacco. He reports that he drinks about 1.2 oz of alcohol per week. He reports that he does not use illicit drugs.   Family History:  The patient's family history includes Anesthesia problems in his brother; Diabetes in his brother; Heart Problems in his brother, brother, and sister; Heart attack in  his father; Heart disease in his mother; Heart failure in his mother; Hypertension in his brother; Kidney failure in his brother; Lung cancer in his mother.    ROS:  General:no colds or fevers, no weight changes, + fatigue Skin:no rashes or ulcers HEENT:no blurred vision, no congestion CV:see HPI PUL:see HPI GI:no diarrhea constipation or melena, no indigestion GU:no hematuria, no dysuria MS:no joint pain, no claudication Neuro:no syncope, no lightheadedness Endo:+ diabetes, no thyroid disease  Wt Readings from Last 3 Encounters:  09/12/14 179 lb 12 oz (81.534 kg)  08/29/14 174 lb 6.4 oz (79.107 kg)  08/12/14 178 lb (80.74 kg)     PHYSICAL EXAM: VS:  BP 130/70 mmHg  Pulse 45  Ht 5' 6.5" (1.689 m)  Wt 179 lb 12 oz (81.534 kg)  BMI 28.58 kg/m2 , BMI Body mass index is 28.58 kg/(m^2). General:Pleasant affect, NAD Skin:Warm and dry, brisk capillary refill HEENT:normocephalic, sclera clear, mucus membranes moist Neck:supple, no JVD, no bruits  Heart:S1S2 RRR without murmur, gallup, rub or click Lungs:clear without rales, rhonchi, or wheezes IRS:WNIO, non tender, + BS, do not palpate liver spleen or masses Ext:no lower ext edema, 2+ pedal pulses, 2+ radial pulses Neuro:alert and oriented X 3,  MAE, follows commands, + facial symmetry    EKG:  EKG is ordered today. The ekg ordered today demonstrates Sinus Brady at 39, lat ST and t wave changes improved from hospital.    Recent Labs: 08/28/2014: ALT 20; Hemoglobin 15.3; Platelets 167 08/29/2014: BUN 8; Creatinine, Ser 0.94; Potassium 4.3; Sodium 129*    Lipid Panel    Component Value Date/Time   CHOL 160 08/28/2014 0610   TRIG 108 08/28/2014 0610   HDL 39* 08/28/2014 0610   CHOLHDL 4.1 08/28/2014 0610   VLDL 22 08/28/2014 0610   LDLCALC 99 08/28/2014 0610       Other studies Reviewed: Additional studies/ records that were reviewed today include: cardiac cath hospital notes.   ASSESSMENT AND PLAN:  1. NSTEMI-- CAD of VG with cardiac cath.  Medical therapy. ranexa added only symptom was dizziness at first but that has resolved.  Hx of CABG  2.  Sinus Brady HR at 39 today. + symptomatic with fatigue.  Discussed with Dr. Angelena Form -decrease cardizem to 360, though we will hold dose tonight, and begin tomorrow.  Will check EKG next week when he is seen by Dr. Jessie Foot.  Also briefly discussed possible need for PPM if meds for angina cause symptomatic bradycardia.  Will send Dr. Tamala Julian today's note.  He will follow with Dr. Tamala Julian first open.   3 Essential hypertension: Stable. Continue beta blocker, calcium channel blocker at lower dose, and ACE inhibitor therapy.   4. Hyperlipidemia: LDL 99. LFTs normal. Increase statin therapy.to lipitor 80, we discussed increase of lipitor vs. Add zetia but due to cost will increase lipitor.   5. Type 2 diabetes mellitus: Continue home medications  6. Chronic systolic/diastolic congestive heart failure: -- EF now 40-50% by LV gram on cath this admission  -- Euvolemic on exam.   7. PAD: He saw Dr. Fletcher Anon on 08/12/14, and ABIs were performed and showed an ABI of 0.79 on the right, and 1.0 on the left.  -- Continue walking Rx follow up with Dr. Fletcher Anon as arranged.   Current medicines are  reviewed with the patient today.  The patient Has no concerns regarding medicines.  The following changes have been made:  See above Labs/ tests ordered today include:see above  Disposition:  FU:  see above  Lennie Muckle, NP  09/12/2014 10:22 AM    [facility] [place], [place], [address] [place], [place] Phone: [phone]; Fax: [phone]

## 2014-09-15 ENCOUNTER — Telehealth: Payer: Self-pay

## 2014-09-15 NOTE — Telephone Encounter (Signed)
-----   Message from Isaiah Serge, NP sent at 09/14/2014  4:41 PM EDT ----- Have pt follow up with APP in 4 weeks to check for angina and HR.  thanks

## 2014-09-16 ENCOUNTER — Encounter: Payer: Self-pay | Admitting: Cardiovascular Disease

## 2014-09-16 ENCOUNTER — Ambulatory Visit (INDEPENDENT_AMBULATORY_CARE_PROVIDER_SITE_OTHER): Payer: Medicare Other | Admitting: Cardiovascular Disease

## 2014-09-16 VITALS — BP 170/70 | HR 60 | Ht 66.5 in | Wt 175.2 lb

## 2014-09-16 DIAGNOSIS — I5042 Chronic combined systolic (congestive) and diastolic (congestive) heart failure: Secondary | ICD-10-CM

## 2014-09-16 DIAGNOSIS — I10 Essential (primary) hypertension: Secondary | ICD-10-CM | POA: Diagnosis not present

## 2014-09-16 DIAGNOSIS — I739 Peripheral vascular disease, unspecified: Secondary | ICD-10-CM

## 2014-09-16 DIAGNOSIS — I25118 Atherosclerotic heart disease of native coronary artery with other forms of angina pectoris: Secondary | ICD-10-CM | POA: Diagnosis not present

## 2014-09-16 MED ORDER — QUINAPRIL HCL 40 MG PO TABS: 40.0000 mg | ORAL_TABLET | Freq: Every day | ORAL | Status: DC

## 2014-09-16 NOTE — Assessment & Plan Note (Signed)
Blood pressure is elevated. I increased the dose of quinapril to 40 mg daily.

## 2014-09-16 NOTE — Patient Instructions (Addendum)
Medication Instructions:  Your physician has recommended you make the following change in your medication:  1. INCREASE Quinapril to '40mg'$  once a day  Labwork: No new orders.  Testing/Procedures: No new orders.  Follow-Up: Your physician recommends that you schedule a follow-up appointment in: 41 WEEKS with PA/NP  Your physician recommends that you schedule a follow-up appointment in: 3 MONTHS with Dr Fletcher Anon   Any Other Special Instructions Will Be Listed Below (If Applicable).

## 2014-09-16 NOTE — Assessment & Plan Note (Signed)
The patient continues to have mild claudication overall. He reports decreased physical activity lately after he twisted his right knee.  he is planning to become more active and continue to monitor symptoms. If claudication becomes lifestyle limiting, the plan is to proceed with angiography.

## 2014-09-16 NOTE — Assessment & Plan Note (Signed)
He appears to be euvolemic.

## 2014-09-16 NOTE — Progress Notes (Signed)
Patient ID: [name] [name], male   DOB: 12-31-1950, 64 y.o.   MRN: [id number]     Date:  09/16/2014   ID:  [name] [name], DOB Aug 01, 1950, MRN [id number]  PCP:  Irven Shelling, MD  Primary cardiologist: Dr. Tamala Julian    SUBJECTIVE: [name] [name] is a 64 y.o. male who is here today for a follow-up visit regarding peripheral arterial disease. He has known history of coronary artery disease status post CABG, chronic diastolic heart failure, hypertension, type 2 diabetes and hyperlipidemia.  He was seen recently for right calf discomfort and cramps with walking. He underwent noninvasive vascular evaluation which showed an ABI of 0.79 on the right and 1.0 on the left. Duplex showed bilateral common iliac artery stenosis greater on the right with a peak velocity of 458. There was also severe right popliteal artery stenosis with a peak velocity of 425 and three-vessel runoff bilaterally.  A walking program was advised. He started doing treadmill with improvement in right calf claudication. However, he presented last month with unstable angina and underwent cardiac catheterization which showed severe native three-vessel coronary artery disease with chronic occlusion of the SVG to ramus and SVG to diagonal. The rest of the grafts were patent. Ejection fraction was 40-50%. He was started on Ranexa. He reports improvement in chest pain. He was noted recently to be bradycardic. The dose of diltiazem was decreased to 360 mg daily. He felt better after that with more energy.  Wt Readings from Last 3 Encounters:  09/16/14 175 lb 3.2 oz (79.47 kg)  09/12/14 179 lb 12 oz (81.534 kg)  08/29/14 174 lb 6.4 oz (79.107 kg)     Past Medical History  Diagnosis Date  . Coronary artery disease     a. s/p MI in 1989;  b. 04/2008 CABG x 5 (LIMA->LAD, VG->D1, VG->RI, VG->PDA, L Radial->OM2);  c. 06/2010 PCI/DES to the Diag (2.25x28 Promus Element DES). VG->Diag occluded;  d. 08/2010 Attempted PTCA of the OM2 via the free radial  graft. Unsuccessful.  Lesion felt to be 2/2 suture.  c. NSTEMI s/p [facility] with severe native and graph dz; D1 that was stented in 2012 now occulded: Rx therapy recommended  . Type II diabetes mellitus   . Hypertension   . Claudication     a. 08/2014 ABI: R 0.79, L 1.0-->Walking program instituted.  . Chronic combined systolic and diastolic CHF (congestive heart failure)   . Hyperlipidemia   . Chest pain     a. experience weekly, nitrate responsive c/p x several years.   b. ranexa added on 08/2014 admission     Current Outpatient Prescriptions  Medication Sig Dispense Refill  . acetaminophen (TYLENOL) 500 MG tablet Take 500 mg by mouth daily as needed for mild pain.    Marland Kitchen ALPRAZolam (XANAX) 0.25 MG tablet Take 0.25 mg by mouth 3 (three) times daily as needed. For anxiety     . Aromatic Inhalants (VICKS VAPOR INHALER IN) Inhale into the lungs daily as needed (for wheezing).     Marland Kitchen aspirin 81 MG tablet Take 1 tablet (81 mg total) by mouth daily.    Marland Kitchen atorvastatin (LIPITOR) 40 MG tablet Take 2 tablets (80 mg total) by mouth daily at 6 PM. 180 tablet 1  . cholecalciferol (VITAMIN D) 1000 UNITS tablet Take 1,000 Units by mouth at bedtime.     . clopidogrel (PLAVIX) 75 MG tablet Take 75 mg by mouth daily with breakfast.    . desoximetasone (TOPICORT) 0.25 % cream Apply 1 application  topically 2 (two) times daily.    Marland Kitchen diltiazem (CARDIZEM CD) 180 MG 24 hr capsule Take 2 capsules (360 mg total) by mouth daily. Take 2 tablets at bedtime 180 capsule 0  . fluticasone (FLONASE) 50 MCG/ACT nasal spray Place 2 sprays into the nose daily as needed. For nasal congestion    . isosorbide mononitrate (IMDUR) 120 MG 24 hr tablet Take 120 mg by mouth daily.    . metoprolol succinate (TOPROL-XL) 50 MG 24 hr tablet Take 50 mg by mouth daily. Take with or immediately following a meal.    . nitroGLYCERIN (NITROSTAT) 0.4 MG SL tablet Place 0.4 mg under the tongue every 5 (five) minutes x 3 doses as needed. For chest pain      . Polyvinyl Alcohol-Povidone (CLEAR EYES ALL SEASONS OP) Place 1 drop into both eyes daily as needed (for dry eyes).    . quinapril (ACCUPRIL) 40 MG tablet Take 1 tablet (40 mg total) by mouth at bedtime. 30 tablet 8  . ranolazine (RANEXA) 500 MG 12 hr tablet Take 1 tablet (500 mg total) by mouth 2 (two) times daily. 60 tablet 11  . sitaGLIPtin (JANUVIA) 100 MG tablet Take 100 mg by mouth at bedtime.      No current facility-administered medications for this visit.    Allergies:    Allergies  Allergen Reactions  . Avandia [Rosiglitazone] Other (See Comments)    Weight gain  . Codeine Itching, Swelling and Other (See Comments)    Burning and heart flutter dizziness    Social History:  The patient  reports that he quit smoking about 27 years ago. He has quit using smokeless tobacco. He reports that he drinks about 1.2 oz of alcohol per week. He reports that he does not use illicit drugs.   ROS:  Please see the history of present illness.   Denies claudication. Denies abdominal pain postprandial. No edema. No orthopnea.   All other systems reviewed and negative.   OBJECTIVE: VS:  BP 170/70 mmHg  Pulse 60  Ht 5' 6.5" (1.689 m)  Wt 175 lb 3.2 oz (79.47 kg)  BMI 27.86 kg/m2 Well nourished, well developed, in no acute distress, appropriate for age 64: normal Neck: JVD flat. Carotid bruit absent  Cardiac:  normal S1, S2; RRR; no murmur Lungs:  clear to auscultation bilaterally, no wheezing, rhonchi or rales Abd: soft, nontender, no hepatomegaly Ext: Edema absent. Pulses 2+ Skin: warm and dry Neuro:  CNs 2-12 intact, no focal abnormalities noted Vascular: Femoral pulses are normal bilaterally. Posterior tibial: +1 on the right and +2 on the left. Bursitis pedis is not palpable.  ECG: Normal sinus rhythm with lateral T wave changes suggestive of ischemia.

## 2014-09-16 NOTE — Assessment & Plan Note (Addendum)
He reports improvement in symptoms with the addition of Ranexa. He longer bradycardic after decreasing the dose of diltiazem. One consideration in the future would be to switch diltiazem to amlodipine and increase the dose of metoprolol.

## 2014-09-16 NOTE — Telephone Encounter (Signed)
Appointment scheduled with APP on 10/21/14.

## 2014-10-21 ENCOUNTER — Ambulatory Visit (INDEPENDENT_AMBULATORY_CARE_PROVIDER_SITE_OTHER): Payer: Medicare Other | Admitting: Nurse Practitioner

## 2014-10-21 ENCOUNTER — Encounter: Payer: Self-pay | Admitting: Nurse Practitioner

## 2014-10-21 VITALS — BP 130/80 | HR 56 | Ht 66.5 in | Wt 177.4 lb

## 2014-10-21 DIAGNOSIS — I2510 Atherosclerotic heart disease of native coronary artery without angina pectoris: Secondary | ICD-10-CM

## 2014-10-21 DIAGNOSIS — E785 Hyperlipidemia, unspecified: Secondary | ICD-10-CM

## 2014-10-21 DIAGNOSIS — I5032 Chronic diastolic (congestive) heart failure: Secondary | ICD-10-CM

## 2014-10-21 DIAGNOSIS — I10 Essential (primary) hypertension: Secondary | ICD-10-CM | POA: Diagnosis not present

## 2014-10-21 DIAGNOSIS — I739 Peripheral vascular disease, unspecified: Secondary | ICD-10-CM

## 2014-10-21 LAB — BASIC METABOLIC PANEL
BUN: 12 mg/dL (ref 7–25)
CO2: 23 mmol/L (ref 20–31)
Calcium: 9.9 mg/dL (ref 8.6–10.3)
Chloride: 93 mmol/L — ABNORMAL LOW (ref 98–110)
Creat: 1 mg/dL (ref 0.70–1.25)
Glucose, Bld: 119 mg/dL — ABNORMAL HIGH (ref 65–99)
Potassium: 4.5 mmol/L (ref 3.5–5.3)
Sodium: 126 mmol/L — ABNORMAL LOW (ref 135–146)

## 2014-10-21 NOTE — Progress Notes (Signed)
CARDIOLOGY OFFICE NOTE  Date:  10/21/2014    [name] [name] Date of Birth: [birth year] Medical Record #[id number]  PCP:  Irven Shelling, MD  Cardiologist:  Smith/Arida    Chief Complaint  Patient presents with  . Hypertension    Follow up visit - seen for Dr. Limmie Patricia  . Coronary Artery Disease    History of Present Illness: [name] [name] is a 64 y.o. male who presents today for a follow up visit. Seen for Dr. Tamala Julian and Dr. Fletcher Anon (PV). He has known history of coronary artery disease status post CABG in 2010 with DES to native DX due to occlusion of the SVG to the DX in June of 2012 and unsuccessful PCI of the 2nd OM via left radial graft in August of 2012. He presented in August of 2016 with unstable angina and underwent cardiac catheterization which showed severe native three-vessel coronary artery disease with chronic occlusion of the SVG to ramus and SVG to diagonal. The rest of the grafts were patent. Ejection fraction was 40-50%. He was started on Ranexa and had improvement in his chest pain. Other issues include chronic diastolic heart failure, bradycardia (requiring reduction of CCB), hypertension, type 2 diabetes, HLD, PVD with noninvasive vascular evaluation which showed an ABI of 0.79 on the right and 1.0 on the left. Duplex showed bilateral common iliac artery stenosis greater on the right with a peak velocity of 458. There was also severe right popliteal artery stenosis with a peak velocity of 425 and three-vessel runoff bilaterally. A walking program was advised.   Seen a month ago by Dr. Fletcher Anon. BP was up. Meds were adjusted again - ACE was increased. His claudication was improved. Energy level improved. Doing well on Ranexa.  Comes back today. Here with his wife. Doing well. BP has improved nicely. Forgot his diary at home but notes good BP control at home. Had some positional dizziness initially after the increase in his ACE - this has now resolved. No chest pain.  Legs are "doing better". He is staying active. He is pleased with how he is doing.   Past Medical History  Diagnosis Date  . Coronary artery disease     a. s/p MI in 1989;  b. 04/2008 CABG x 5 (LIMA->LAD, VG->D1, VG->RI, VG->PDA, L Radial->OM2);  c. 06/2010 PCI/DES to the Diag (2.25x28 Promus Element DES). VG->Diag occluded;  d. 08/2010 Attempted PTCA of the OM2 via the free radial graft. Unsuccessful.  Lesion felt to be 2/2 suture.  c. NSTEMI s/p [facility] with severe native and graph dz; D1 that was stented in 2012 now occulded: Rx therapy recommended  . Type II diabetes mellitus ([place])   . Hypertension   . Claudication ([place])     a. 08/2014 ABI: R 0.79, L 1.0-->Walking program instituted.  . Chronic combined systolic and diastolic CHF (congestive heart failure) ([place])   . Hyperlipidemia   . Chest pain     a. experience weekly, nitrate responsive c/p x several years.   b. ranexa added on 08/2014 admission     Past Surgical History  Procedure Laterality Date  . Coronary artery bypass graft    . Coronary angioplasty with stent placement    . Shoulder surgery Right 7-8 years ago    Dr. Dorna Leitz  . Colonoscopy    . Shoulder arthroscopy with rotator cuff repair and subacromial decompression Left 05/23/2013    Procedure: LEFT SHOULDER ARTHROSCOPY SUBACROMIAL  DECOMPRESSION DISTAL CLAVICLE RESECTION AND ROTATOR CUFF REPAIR ;  Surgeon: Marin Shutter, MD;  Location: [facility];  Service: Orthopedics;  Laterality: Left;  . Cardiac catheterization N/A 08/28/2014    Procedure: Left Heart Cath and Cors/Grafts Angiography;  Surgeon: Belva Crome, MD;  Location: [facility] [facility];  Service: Cardiovascular;  Laterality: N/A;     Medications: Current Outpatient Prescriptions  Medication Sig Dispense Refill  . acetaminophen (TYLENOL) 500 MG tablet Take 500 mg by mouth daily as needed for mild pain.    Marland Kitchen ALPRAZolam (XANAX) 0.25 MG tablet Take 0.25 mg by mouth 3 (three) times daily as needed. For anxiety     .  Aromatic Inhalants (VICKS VAPOR INHALER IN) Inhale into the lungs daily as needed (for wheezing).     Marland Kitchen aspirin 81 MG tablet Take 1 tablet (81 mg total) by mouth daily.    Marland Kitchen atorvastatin (LIPITOR) 40 MG tablet Take 2 tablets (80 mg total) by mouth daily at 6 PM. 180 tablet 1  . cholecalciferol (VITAMIN D) 1000 UNITS tablet Take 1,000 Units by mouth at bedtime.     . clopidogrel (PLAVIX) 75 MG tablet Take 75 mg by mouth daily with breakfast.    . desoximetasone (TOPICORT) 0.25 % cream Apply 1 application topically 2 (two) times daily.    Marland Kitchen diltiazem (CARDIZEM CD) 180 MG 24 hr capsule Take 2 capsules (360 mg total) by mouth daily. Take 2 tablets at bedtime 180 capsule 0  . fluticasone (FLONASE) 50 MCG/ACT nasal spray Place 2 sprays into the nose daily as needed. For nasal congestion    . isosorbide mononitrate (IMDUR) 120 MG 24 hr tablet Take 120 mg by mouth daily.    . metoprolol succinate (TOPROL-XL) 50 MG 24 hr tablet Take 50 mg by mouth daily. Take with or immediately following a meal.    . nitroGLYCERIN (NITROSTAT) 0.4 MG SL tablet Place 0.4 mg under the tongue every 5 (five) minutes x 3 doses as needed. For chest pain    . Polyvinyl Alcohol-Povidone (CLEAR EYES ALL SEASONS OP) Place 1 drop into both eyes daily as needed (for dry eyes).    . quinapril (ACCUPRIL) 40 MG tablet Take 1 tablet (40 mg total) by mouth at bedtime. 30 tablet 8  . ranolazine (RANEXA) 500 MG 12 hr tablet Take 1 tablet (500 mg total) by mouth 2 (two) times daily. 60 tablet 11  . sitaGLIPtin (JANUVIA) 100 MG tablet Take 100 mg by mouth at bedtime.      No current facility-administered medications for this visit.    Allergies: Allergies  Allergen Reactions  . Avandia [Rosiglitazone] Other (See Comments)    Weight gain  . Codeine Itching, Swelling and Other (See Comments)    Burning and heart flutter dizziness    Social History: The patient  reports that he quit smoking about 27 years ago. He has quit using  smokeless tobacco. He reports that he drinks about 1.2 oz of alcohol per week. He reports that he does not use illicit drugs.   Family History: The patient's family history includes Anesthesia problems in his brother; Diabetes in his brother; Heart Problems in his brother, brother, and sister; Heart attack in his father; Heart disease in his mother; Heart failure in his mother; Hypertension in his brother; Kidney failure in his brother; Lung cancer in his mother.   Review of Systems: Please see the history of present illness.   Otherwise, the review of systems is positive for none.   All other systems are reviewed and negative.  Physical Exam: VS:  BP 130/80 mmHg  Pulse 56  Ht 5' 6.5" (1.689 m)  Wt 177 lb 6.4 oz (80.468 kg)  BMI 28.21 kg/m2  SpO2 100% .  BMI Body mass index is 28.21 kg/(m^2).  Wt Readings from Last 3 Encounters:  10/21/14 177 lb 6.4 oz (80.468 kg)  09/16/14 175 lb 3.2 oz (79.47 kg)  09/12/14 179 lb 12 oz (81.534 kg)    General: Pleasant. Well developed, well nourished and in no acute distress.  HEENT: Normal. Neck: Supple, no JVD, carotid bruits, or masses noted.  Cardiac: Regular rate and rhythm. No murmurs, rubs, or gallops. No edema.  Respiratory:  Lungs are clear to auscultation bilaterally with normal work of breathing.  GI: Soft and nontender.  MS: No deformity or atrophy. Gait and ROM intact. Skin: Warm and dry. Color is normal.  Neuro:  Strength and sensation are intact and no gross focal deficits noted.  Psych: Alert, appropriate and with normal affect.   LABORATORY DATA:  EKG:  EKG is not ordered today.  Lab Results  Component Value Date   WBC 10.4 08/28/2014   HGB 15.3 08/28/2014   HCT 42.9 08/28/2014   PLT 167 08/28/2014   GLUCOSE 244* 08/29/2014   CHOL 160 08/28/2014   TRIG 108 08/28/2014   HDL 39* 08/28/2014   LDLCALC 99 08/28/2014   ALT 20 08/28/2014   AST 25 08/28/2014   NA 129* 08/29/2014   K 4.3 08/29/2014   CL 100* 08/29/2014     CREATININE 0.94 08/29/2014   BUN 8 08/29/2014   CO2 22 08/29/2014   TSH 1.555 06/02/2011   INR 1.10 08/28/2014   HGBA1C 6.2* 01/04/2011    BNP (last 3 results) No results for input(s): BNP in the last 8760 hours.  ProBNP (last 3 results) No results for input(s): PROBNP in the last 8760 hours.   Other Studies Reviewed Today: Cardiac Cath Conclusion from August 2016     Severe native vessel disease with 50% distal left main, 80% ostial LAD, total occlusion of the proximal circumflex, 99% stenosis of the ramus intermedius, and total occlusion of the mid RCA. Additionally, the large first diagonal that was stented in 2012 is now totally occluded and represents the only significant change when compared to those images.  Severe bypass graft occlusive disease with total occlusion occlusion of the saphenous vein graft to the ramus intermedius and total occlusion of the saphenous vein graft to the diagonal. The free radial graft to the obtuse marginal remains patent. The saphenous vein graft to the PDA is patent. The LIMA to the LAD is widely patent.  The obtuse marginal distal to the insertion of the free radial contains 90% stenosis. This region represents an non-dilatable lesion from attempted PCI in 2012. It is angiographically unchanged.  The distal diagonal territory now receives collaterals from the left coronary. The ramus intermedius has sluggish flow and is also collateralized.  Left ventricular dysfunction with akinesis of the mid anterolateral wall. EF 40-50%.   Recommendations:   Continue medical therapy.     Assessment/Plan: 1. CAD - no symptoms on current medical therapy  2. PVD - followed by Dr. Fletcher Anon  3. HTN - BP has improved nicely with increase in his ACE - recheck BMET today.   4. Chronic diastolic HF - looks good clinically.  5. Bradycardia - would avoid increasing beta blocker or CCB going forward.   Current medicines are reviewed with the patient  today.  The patient does  not have concerns regarding medicines other than what has been noted above.  The following changes have been made:  See above.  Labs/ tests ordered today include:    Orders Placed This Encounter  Procedures  . Basic metabolic panel     Disposition:   FU with Dr. Tamala Julian in 4 months (have pushed out his recall that was for November) and Dr. Fletcher Anon as planned in December.   Patient is agreeable to this plan and will call if any problems develop in the interim.   Signed: Burtis Junes, RN, ANP-C 10/21/2014 9:34 AM  [facility] [address] [place], [place]  [address] Phone: [phone] Fax: [phone]

## 2014-10-21 NOTE — Patient Instructions (Addendum)
We will be checking the following labs today - BMET   Medication Instructions:    Continue with your current medicines.     Testing/Procedures To Be Arranged:  N/A  Follow-Up:   See Dr. Fletcher Anon in December  See Dr. Tamala Julian in 4 months    Other Special Instructions:   Keep a check on your BP at home.   Call the [place] office at [phone] if you have any questions, problems or concerns.

## 2014-10-22 ENCOUNTER — Other Ambulatory Visit: Payer: Self-pay | Admitting: *Deleted

## 2014-10-22 ENCOUNTER — Telehealth: Payer: Self-pay | Admitting: Nurse Practitioner

## 2014-10-22 DIAGNOSIS — E871 Hypo-osmolality and hyponatremia: Secondary | ICD-10-CM

## 2014-10-22 NOTE — Telephone Encounter (Signed)
F/u       Pt returning Danielle's call for lab results.

## 2014-10-31 ENCOUNTER — Other Ambulatory Visit (INDEPENDENT_AMBULATORY_CARE_PROVIDER_SITE_OTHER): Payer: Medicare Other

## 2014-10-31 DIAGNOSIS — E871 Hypo-osmolality and hyponatremia: Secondary | ICD-10-CM | POA: Diagnosis not present

## 2014-10-31 LAB — BASIC METABOLIC PANEL
BUN: 15 mg/dL (ref 7–25)
CO2: 22 mmol/L (ref 20–31)
Calcium: 9.4 mg/dL (ref 8.6–10.3)
Chloride: 95 mmol/L — ABNORMAL LOW (ref 98–110)
Creat: 0.98 mg/dL (ref 0.70–1.25)
Glucose, Bld: 123 mg/dL — ABNORMAL HIGH (ref 65–99)
Potassium: 4.3 mmol/L (ref 3.5–5.3)
Sodium: 127 mmol/L — ABNORMAL LOW (ref 135–146)

## 2014-11-03 ENCOUNTER — Telehealth: Payer: Self-pay | Admitting: *Deleted

## 2014-11-03 DIAGNOSIS — I5032 Chronic diastolic (congestive) heart failure: Secondary | ICD-10-CM

## 2014-11-03 DIAGNOSIS — E871 Hypo-osmolality and hyponatremia: Secondary | ICD-10-CM

## 2014-11-03 NOTE — Telephone Encounter (Signed)
Notes Recorded by Burtis Junes, NP on 11/03/2014 at 7:35 AM Ok to report. Labs are stable sodium is a little better - needs to really restrict his fluids - to less than 1500 cc per day BMET in 2 weeks  May have to change BP medicines if does not improve.

## 2014-11-17 ENCOUNTER — Other Ambulatory Visit (INDEPENDENT_AMBULATORY_CARE_PROVIDER_SITE_OTHER): Payer: Medicare Other | Admitting: *Deleted

## 2014-11-17 DIAGNOSIS — I5032 Chronic diastolic (congestive) heart failure: Secondary | ICD-10-CM | POA: Diagnosis not present

## 2014-11-17 DIAGNOSIS — E871 Hypo-osmolality and hyponatremia: Secondary | ICD-10-CM

## 2014-11-17 LAB — BASIC METABOLIC PANEL
BUN: 14 mg/dL (ref 7–25)
CO2: 22 mmol/L (ref 20–31)
Calcium: 9.6 mg/dL (ref 8.6–10.3)
Chloride: 93 mmol/L — ABNORMAL LOW (ref 98–110)
Creat: 1.02 mg/dL (ref 0.70–1.25)
Glucose, Bld: 104 mg/dL — ABNORMAL HIGH (ref 65–99)
Potassium: 4.7 mmol/L (ref 3.5–5.3)
Sodium: 128 mmol/L — ABNORMAL LOW (ref 135–146)

## 2014-11-30 ENCOUNTER — Other Ambulatory Visit: Payer: Self-pay | Admitting: Cardiology

## 2014-12-01 NOTE — Telephone Encounter (Signed)
Rx request sent to pharmacy.  

## 2014-12-16 ENCOUNTER — Ambulatory Visit (INDEPENDENT_AMBULATORY_CARE_PROVIDER_SITE_OTHER): Payer: Medicare Other | Admitting: Cardiovascular Disease

## 2014-12-16 ENCOUNTER — Encounter: Payer: Self-pay | Admitting: Cardiovascular Disease

## 2014-12-16 VITALS — BP 160/64 | HR 55 | Ht 66.5 in | Wt 179.0 lb

## 2014-12-16 DIAGNOSIS — I25118 Atherosclerotic heart disease of native coronary artery with other forms of angina pectoris: Secondary | ICD-10-CM

## 2014-12-16 DIAGNOSIS — E785 Hyperlipidemia, unspecified: Secondary | ICD-10-CM | POA: Diagnosis not present

## 2014-12-16 DIAGNOSIS — I739 Peripheral vascular disease, unspecified: Secondary | ICD-10-CM

## 2014-12-16 DIAGNOSIS — I10 Essential (primary) hypertension: Secondary | ICD-10-CM

## 2014-12-16 MED ORDER — CARVEDILOL 6.25 MG PO TABS: 6.2500 mg | ORAL_TABLET | Freq: Two times a day (BID) | ORAL | Status: DC

## 2014-12-16 NOTE — Patient Instructions (Addendum)
Medication Instructions:  Your physician has recommended you make the following change in your medication:  1. STOP Aspirin '325mg'$  2. CONTINUE Aspirin '81mg'$  once a day 3. STOP Metoprolol Succinate 4. START Carvedilol 6.'25mg'$  take one tablet by mouth twice a day  Labwork: No new orders.   Testing/Procedures: No new orders.   Follow-Up: Your physician wants you to follow-up in: 6 MONTHS with Dr Fletcher Anon.  You will receive a reminder letter in the mail two months in advance. If you don't receive a letter, please call our office to schedule the follow-up appointment.   Any Other Special Instructions Will Be Listed Below (If Applicable).     If you need a refill on your cardiac medications before your next appointment, please call your pharmacy.

## 2014-12-16 NOTE — Assessment & Plan Note (Signed)
Lab Results  Component Value Date   CHOL 160 08/28/2014   HDL 39* 08/28/2014   LDLCALC 99 08/28/2014   TRIG 108 08/28/2014   CHOLHDL 4.1 08/28/2014   He is currently on atorvastatin and Zetia.

## 2014-12-16 NOTE — Progress Notes (Signed)
Patient ID: Daltin Crist, male   DOB: 1950/10/15, 64 y.o.   MRN: [id number]     Date:  12/16/2014   ID:  Keelon Zurn, DOB 1950/04/25, MRN [id number]  PCP:  Irven Shelling, MD  Primary cardiologist: Dr. Tamala Julian    SUBJECTIVE: Ladell Lea is a 64 y.o. male who is here today for a follow-up visit regarding peripheral arterial disease. He has known history of coronary artery disease status post CABG, chronic diastolic heart failure, hypertension, type 2 diabetes and hyperlipidemia.  He is being followed for right lower extremity claudication . noninvasive vascular evaluation which showed an ABI of 0.79 on the right and 1.0 on the left. Duplex showed bilateral common iliac artery stenosis greater on the right with a peak velocity of 458. There was also severe right popliteal artery stenosis with a peak velocity of 425 and three-vessel runoff bilaterally.   The patient elected for attempted medical therapy and has been walking on the treadmill at a speed of 2.3 mph. He usually starts getting right calf tightness after about 10 minutes and he is able to continue exercising. He feels that this is gradually getting better. He continues to complain of dizziness and he is noted again to be somewhat bradycardic.   Wt Readings from Last 3 Encounters:  12/16/14 179 lb (81.194 kg)  10/21/14 177 lb 6.4 oz (80.468 kg)  09/16/14 175 lb 3.2 oz (79.47 kg)     Past Medical History  Diagnosis Date  . Coronary artery disease     a. s/p MI in 1989;  b. 04/2008 CABG x 5 (LIMA->LAD, VG->D1, VG->RI, VG->PDA, L Radial->OM2);  c. 06/2010 PCI/DES to the Diag (2.25x28 Promus Element DES). VG->Diag occluded;  d. 08/2010 Attempted PTCA of the OM2 via the free radial graft. Unsuccessful.  Lesion felt to be 2/2 suture.  c. NSTEMI s/p [facility] with severe native and graph dz; D1 that was stented in 2012 now occulded: Rx therapy recommended  . Type II diabetes mellitus ([place])   . Hypertension   . Claudication ([place])     a.  08/2014 ABI: R 0.79, L 1.0-->Walking program instituted.  . Chronic combined systolic and diastolic CHF (congestive heart failure) ([place])   . Hyperlipidemia   . Chest pain     a. experience weekly, nitrate responsive c/p x several years.   b. ranexa added on 08/2014 admission     Current Outpatient Prescriptions  Medication Sig Dispense Refill  . acetaminophen (TYLENOL) 500 MG tablet Take 500 mg by mouth daily as needed for mild pain.    Marland Kitchen ALPRAZolam (XANAX) 0.25 MG tablet Take 0.25 mg by mouth 3 (three) times daily as needed. For anxiety     . Aromatic Inhalants (VICKS VAPOR INHALER IN) Inhale into the lungs daily as needed (for wheezing).     Marland Kitchen aspirin 325 MG tablet Take 325 mg by mouth daily. Take 325 mg every evening    . aspirin 81 MG tablet Take 81 mg by mouth daily. Take 81 mg every morning    . atorvastatin (LIPITOR) 40 MG tablet Take 2 tablets (80 mg total) by mouth daily at 6 PM. 180 tablet 1  . CARTIA XT 180 MG 24 hr capsule TAKE TWO CAPSULES BY MOUTH ONCE DAILY AT BEDTIME 180 capsule 0  . cholecalciferol (VITAMIN D) 1000 UNITS tablet Take 1,000 Units by mouth at bedtime.     . clopidogrel (PLAVIX) 75 MG tablet Take 75 mg by mouth daily with breakfast.    .  desoximetasone (TOPICORT) 0.25 % cream Apply 1 application topically 2 (two) times daily.    . fluticasone (FLONASE) 50 MCG/ACT nasal spray Place 2 sprays into the nose daily as needed. For nasal congestion    . isosorbide mononitrate (IMDUR) 120 MG 24 hr tablet Take 120 mg by mouth daily.    . metoprolol succinate (TOPROL-XL) 50 MG 24 hr tablet Take 50 mg by mouth daily. Take with or immediately following a meal.    . nitroGLYCERIN (NITROSTAT) 0.4 MG SL tablet Place 0.4 mg under the tongue every 5 (five) minutes x 3 doses as needed. For chest pain    . Polyvinyl Alcohol-Povidone (CLEAR EYES ALL SEASONS OP) Place 1 drop into both eyes daily as needed (for dry eyes).    . quinapril (ACCUPRIL) 40 MG tablet Take 1 tablet (40 mg total)  by mouth at bedtime. 30 tablet 8  . ranolazine (RANEXA) 500 MG 12 hr tablet Take 1 tablet (500 mg total) by mouth 2 (two) times daily. 60 tablet 11  . sitaGLIPtin (JANUVIA) 100 MG tablet Take 100 mg by mouth at bedtime.      No current facility-administered medications for this visit.    Allergies:    Allergies  Allergen Reactions  . Avandia [Rosiglitazone] Other (See Comments)    Weight gain  . Codeine Itching, Swelling and Other (See Comments)    Burning and heart flutter dizziness    Social History:  The patient  reports that he quit smoking about 27 years ago. He has quit using smokeless tobacco. He reports that he drinks about 1.2 oz of alcohol per week. He reports that he does not use illicit drugs.   ROS:  Please see the history of present illness.   Denies claudication. Denies abdominal pain postprandial. No edema. No orthopnea.   All other systems reviewed and negative.   OBJECTIVE: VS:  BP 160/64 mmHg  Pulse 55  Ht 5' 6.5" (1.689 m)  Wt 179 lb (81.194 kg)  BMI 28.46 kg/m2 Well nourished, well developed, in no acute distress, appropriate for age 78: normal Neck: JVD flat. Carotid bruit absent  Cardiac:  normal S1, S2; RRR; no murmur Lungs:  clear to auscultation bilaterally, no wheezing, rhonchi or rales Abd: soft, nontender, no hepatomegaly Ext: Edema absent. Pulses 2+ Skin: warm and dry Neuro:  CNs 2-12 intact, no focal abnormalities noted Vascular: Femoral pulses are normal bilaterally. Posterior tibial: +1 on the right and +2 on the left.

## 2014-12-16 NOTE — Assessment & Plan Note (Signed)
He reports significant improvement in angina after the addition of Ranexa.

## 2014-12-16 NOTE — Assessment & Plan Note (Signed)
The patient has known right leg claudication due to stenosis affecting the common iliac artery and popliteal artery. His claudication improved with a walking program and he currently does not feel that this is lifestyle limiting. Thus, I'm going to continue medical therapy for now.

## 2014-12-16 NOTE — Assessment & Plan Note (Signed)
Blood pressure is not optimally controlled and his heart rate continues to be on the low side. Thus, I elected to switch him from metoprolol to carvedilol 6.25 mg twice daily.

## 2015-01-29 ENCOUNTER — Other Ambulatory Visit: Payer: Self-pay | Admitting: Cardiovascular Disease

## 2015-01-29 DIAGNOSIS — I10 Essential (primary) hypertension: Secondary | ICD-10-CM

## 2015-01-29 DIAGNOSIS — I739 Peripheral vascular disease, unspecified: Secondary | ICD-10-CM

## 2015-01-29 MED ORDER — EZETIMIBE 10 MG PO TABS: 10.0000 mg | ORAL_TABLET | Freq: Every day | ORAL | Status: DC

## 2015-01-29 MED ORDER — QUINAPRIL HCL 40 MG PO TABS: 40.0000 mg | ORAL_TABLET | Freq: Every day | ORAL | Status: DC

## 2015-01-29 MED ORDER — CARVEDILOL 6.25 MG PO TABS: 6.2500 mg | ORAL_TABLET | Freq: Two times a day (BID) | ORAL | Status: DC

## 2015-03-02 ENCOUNTER — Telehealth: Payer: Self-pay | Admitting: Interventional Cardiology

## 2015-03-02 NOTE — Telephone Encounter (Signed)
Returned pt call.[email or web address] 

## 2015-03-02 NOTE — Telephone Encounter (Signed)
Pt is going to have cataract surgery on 03-05-15. He had a blockage last summer,wants to know if this will effect his eye surgery?

## 2015-03-04 NOTE — Telephone Encounter (Signed)
They need clearance asap,surgery is tomorrow.

## 2015-03-04 NOTE — Telephone Encounter (Signed)
Will route to Dr. Tamala Julian to respond ASAP.  He is for surgery tomorrow-left eye cataract.

## 2015-03-04 NOTE — Telephone Encounter (Signed)
Will route this message to the pts Primary Cardiologist and covering CMA, for cardiac clearance request for the pt to receive left eye cataract removal, and follow-up with Dr Trena Platt office once clearance complete.

## 2015-03-04 NOTE — Telephone Encounter (Signed)
Request for surgical clearance:  1. What type of surgery is being performed? Left eye cataract removal   2. When is this surgery scheduled? 03-05-15 at 2P.M. 3. Are there any medications that need to be held prior to surgery and how long?just needs cardiac clearance   4. Name of physician performing surgery? Dr Tama High  5. What is your office phone and fax number? [phone] and [phone] MNO:[email or web address]  6.

## 2015-03-04 NOTE — Telephone Encounter (Signed)
[facility] calling again to get clearance.  Spoke w/Dr. Marius Ditch (DOD) who approved him to have the cataract surgery tomorrow.  Faxed the clearance to [phone] ATT: Freda Munro.

## 2015-03-08 ENCOUNTER — Other Ambulatory Visit: Payer: Self-pay | Admitting: Cardiology

## 2015-03-26 ENCOUNTER — Ambulatory Visit (INDEPENDENT_AMBULATORY_CARE_PROVIDER_SITE_OTHER): Payer: Medicare Other | Admitting: Interventional Cardiology

## 2015-03-26 ENCOUNTER — Encounter: Payer: Self-pay | Admitting: Interventional Cardiology

## 2015-03-26 VITALS — BP 176/82 | HR 69 | Ht 66.0 in | Wt 171.2 lb

## 2015-03-26 DIAGNOSIS — E785 Hyperlipidemia, unspecified: Secondary | ICD-10-CM | POA: Diagnosis not present

## 2015-03-26 DIAGNOSIS — I739 Peripheral vascular disease, unspecified: Secondary | ICD-10-CM

## 2015-03-26 DIAGNOSIS — I10 Essential (primary) hypertension: Secondary | ICD-10-CM | POA: Diagnosis not present

## 2015-03-26 DIAGNOSIS — I25709 Atherosclerosis of coronary artery bypass graft(s), unspecified, with unspecified angina pectoris: Secondary | ICD-10-CM | POA: Diagnosis not present

## 2015-03-26 DIAGNOSIS — I5032 Chronic diastolic (congestive) heart failure: Secondary | ICD-10-CM

## 2015-03-26 MED ORDER — HYDROCHLOROTHIAZIDE 12.5 MG PO CAPS
12.5000 mg | ORAL_CAPSULE | ORAL | Status: DC
Start: 2015-03-26 — End: 2015-04-06

## 2015-03-26 NOTE — Patient Instructions (Addendum)
Medication Instructions:  Your physician has recommended you make the following change in your medication:  START HCTZ 12.'5mg'$  every Mon,Wed,Fri. Rx sent to your pharmacy   Labwork: Your physician recommends that you return for lab work in: 2 weeks (Bmet)   Testing/Procedures: None ordered  Follow-Up: Your physician recommends that you schedule a follow-up appointment in: 4 weeks with an APP for BP check  Your physician wants you to follow-up in: 1 year with Dr.Smith You will receive a reminder letter in the mail two months in advance. If you don't receive a letter, please call our office to schedule the follow-up appointment.    Any Other Special Instructions Will Be Listed Below (If Applicable). Your physician has requested that you regularly monitor and record your blood pressure readings at home mid-morning 2-3 hours after taking your medication. Please use the same machine at the same time of day to check your readings and record them. Please call the office in 2 weeks to report your readings       If you need a refill on your cardiac medications before your next appointment, please call your pharmacy.  Marland Kitchen

## 2015-03-26 NOTE — Progress Notes (Signed)
Cardiology Office Note   Date:  03/26/2015   ID:  [name] [name], DOB Nov 30, 1950, MRN [id number]  PCP:  Irven Shelling, MD  Cardiologist:  Sinclair Grooms, MD   Chief Complaint  Patient presents with  . Hypertension      History of Present Illness: [name] [name] is a 65 y.o. male who presents for CAD, previous coronary bypass grafting 2010, drug-eluting stents 2012 due to bypass graft failure, essential hypertension, chronic diastolic heart failure, claudication, and diabetes mellitus.  Overall doing relatively well. Angina is been stable. He doesn't sleep well at night. He's been awakening from time to time with epigastric fullness. He at times a nitroglycerin use use. They usually help. He does not get the epigastric fullness with activity. He is not having palpitations or syncope. He denies orthopnea.     Past Medical History  Diagnosis Date  . Coronary artery disease     a. s/p MI in 1989;  b. 04/2008 CABG x 5 (LIMA->LAD, VG->D1, VG->RI, VG->PDA, L Radial->OM2);  c. 06/2010 PCI/DES to the Diag (2.25x28 Promus Element DES). VG->Diag occluded;  d. 08/2010 Attempted PTCA of the OM2 via the free radial graft. Unsuccessful.  Lesion felt to be 2/2 suture.  c. NSTEMI s/p [facility] with severe native and graph dz; D1 that was stented in 2012 now occulded: Rx therapy recommended  . Type II diabetes mellitus ([place])   . Hypertension   . Claudication ([place])     a. 08/2014 ABI: R 0.79, L 1.0-->Walking program instituted.  . Chronic combined systolic and diastolic CHF (congestive heart failure) ([place])   . Hyperlipidemia   . Chest pain     a. experience weekly, nitrate responsive c/p x several years.   b. ranexa added on 08/2014 admission     Past Surgical History  Procedure Laterality Date  . Coronary artery bypass graft    . Coronary angioplasty with stent placement    . Shoulder surgery Right 7-8 years ago    Dr. Dorna Leitz  . Colonoscopy    . Shoulder arthroscopy with rotator cuff  repair and subacromial decompression Left 05/23/2013    Procedure: LEFT SHOULDER ARTHROSCOPY SUBACROMIAL  DECOMPRESSION DISTAL CLAVICLE RESECTION AND ROTATOR CUFF REPAIR ;  Surgeon: Marin Shutter, MD;  Location: [place];  Service: Orthopedics;  Laterality: Left;  . Cardiac catheterization N/A 08/28/2014    Procedure: Left Heart Cath and Cors/Grafts Angiography;  Surgeon: Belva Crome, MD;  Location: [place] [facility];  Service: Cardiovascular;  Laterality: N/A;     Current Outpatient Prescriptions  Medication Sig Dispense Refill  . acetaminophen (TYLENOL) 500 MG tablet Take 500 mg by mouth daily as needed for mild pain.    Marland Kitchen ALPRAZolam (XANAX) 0.25 MG tablet Take 0.25 mg by mouth 3 (three) times daily as needed. For anxiety     . Aromatic Inhalants (VICKS VAPOR INHALER IN) Inhale into the lungs daily as needed (for wheezing).     Marland Kitchen aspirin 81 MG tablet Take 81 mg by mouth daily. Take 81 mg every morning    . atorvastatin (LIPITOR) 40 MG tablet Take 2 tablets (80 mg total) by mouth daily at 6 PM. 180 tablet 1  . CARTIA XT 180 MG 24 hr capsule TAKE TWO CAPSULES BY MOUTH ONCE DAILY AT BEDTIME 180 capsule 0  . carvedilol (COREG) 6.25 MG tablet Take 1 tablet (6.25 mg total) by mouth 2 (two) times daily. 180 tablet 1  . cholecalciferol (VITAMIN D) 1000 UNITS tablet  Take 1,000 Units by mouth at bedtime.     . clopidogrel (PLAVIX) 75 MG tablet Take 75 mg by mouth daily with breakfast.    . desoximetasone (TOPICORT) 0.25 % cream Apply 1 application topically 2 (two) times daily.    Marland Kitchen ezetimibe (ZETIA) 10 MG tablet Take 1 tablet (10 mg total) by mouth daily. 90 tablet 1  . fluticasone (FLONASE) 50 MCG/ACT nasal spray Place 2 sprays into the nose daily as needed. For nasal congestion    . isosorbide mononitrate (IMDUR) 120 MG 24 hr tablet Take 120 mg by mouth daily.    . nitroGLYCERIN (NITROSTAT) 0.4 MG SL tablet Place 0.4 mg under the tongue every 5 (five) minutes x 3 doses as needed. For chest pain    .  Polyvinyl Alcohol-Povidone (CLEAR EYES ALL SEASONS OP) Place 1 drop into both eyes daily as needed (for dry eyes).    . quinapril (ACCUPRIL) 40 MG tablet Take 1 tablet (40 mg total) by mouth at bedtime. 90 tablet 1  . ranolazine (RANEXA) 500 MG 12 hr tablet Take 1 tablet (500 mg total) by mouth 2 (two) times daily. 60 tablet 11  . sitaGLIPtin (JANUVIA) 100 MG tablet Take 100 mg by mouth at bedtime.      No current facility-administered medications for this visit.    Allergies:   Avandia and Codeine    Social History:  The patient  reports that he quit smoking about 28 years ago. He has quit using smokeless tobacco. He reports that he drinks about 1.2 oz of alcohol per week. He reports that he does not use illicit drugs.   Family History:  The patient's family history includes Anesthesia problems in his brother; Diabetes in his brother; Heart Problems in his brother, brother, and sister; Heart attack in his father; Heart disease in his mother; Heart failure in his mother; Hypertension in his brother; Kidney failure in his brother; Lung cancer in his mother.    ROS:  Please see the history of present illness.   Otherwise, review of systems are positive for recurring abdominal discomfort, snoring, but otherwise doing okay..   All other systems are reviewed and negative.    PHYSICAL EXAM: VS:  BP 176/82 mmHg  Pulse 69  Ht '5\' 6"'$  (1.676 m)  Wt 171 lb 3.2 oz (77.656 kg)  BMI 27.65 kg/m2 , BMI Body mass index is 27.65 kg/(m^2). GEN: Well nourished, well developed, in no acute distress HEENT: normal Neck: no JVD, carotid bruits, or masses Cardiac: rRR.  There is no murmur, rub, or gallop. There is no edema. Respiratory:  clear to auscultation bilaterally, normal work of breathing. GI: soft, nontender, nondistended, + BS MS: no deformity or atrophy Skin: warm and dry, no rash Neuro:  Strength and sensation are intact Psych: euthymic mood, full affect   EKG:  EKG is not ordered  today.    Recent Labs: 08/28/2014: ALT 20; Hemoglobin 15.3; Platelets 167 11/17/2014: BUN 14; Creat 1.02; Potassium 4.7; Sodium 128*    Lipid Panel    Component Value Date/Time   CHOL 160 08/28/2014 0610   TRIG 108 08/28/2014 0610   HDL 39* 08/28/2014 0610   CHOLHDL 4.1 08/28/2014 0610   VLDL 22 08/28/2014 0610   LDLCALC 99 08/28/2014 0610      Wt Readings from Last 3 Encounters:  03/26/15 171 lb 3.2 oz (77.656 kg)  12/16/14 179 lb (81.194 kg)  10/21/14 177 lb 6.4 oz (80.468 kg)      Other  studies Reviewed: Additional studies/ records that were reviewed today include: Reviewed prior laboratory data. The findings include tends to run relatively low sodium levels.    ASSESSMENT AND PLAN:  1. Chronic diastolic heart failure (HCC) No evidence of volume overload  2. Coronary artery disease involving coronary bypass graft of native heart with unspecified angina pectoris Angina occurs in cold weather  3. Hyperlipidemia Followed by primary care  4. Essential hypertension Elevated in the office today. I reviewed his BP log and systolics are consistently above 150 mmHg.  5. Peripheral arterial disease ([place]) Followed by Dr.Arida.    Current medicines are reviewed at length with the patient today.  The patient has the following concerns regarding medicines: Has intrinsic hyponatremia.  The following changes/actions have been instituted:    Start HCTZ 12.5 mg Monday, Wednesday, and Friday  Continue to monitor blood pressure late morning and report blood pressure results in 2 weeks  Basic metabolic panel in 2 weeks  BP check by APP 1 month  Labs/ tests ordered today include:  No orders of the defined types were placed in this encounter.     Disposition:   FU with HS in 1 year  Signed, Sinclair Grooms, MD  03/26/2015 11:49 AM    [address], [place], [place]  [address] Phone: [phone]; Fax: [phone]

## 2015-04-06 ENCOUNTER — Other Ambulatory Visit: Payer: Self-pay | Admitting: *Deleted

## 2015-04-06 DIAGNOSIS — I10 Essential (primary) hypertension: Secondary | ICD-10-CM

## 2015-04-06 MED ORDER — HYDROCHLOROTHIAZIDE 12.5 MG PO CAPS: 12.5000 mg | ORAL_CAPSULE | ORAL | Status: DC

## 2015-04-07 ENCOUNTER — Other Ambulatory Visit: Payer: Self-pay | Admitting: *Deleted

## 2015-04-07 MED ORDER — RANOLAZINE ER 500 MG PO TB12: 500.0000 mg | ORAL_TABLET | Freq: Two times a day (BID) | ORAL | Status: DC

## 2015-04-09 ENCOUNTER — Other Ambulatory Visit (INDEPENDENT_AMBULATORY_CARE_PROVIDER_SITE_OTHER): Payer: Medicare Other | Admitting: *Deleted

## 2015-04-09 ENCOUNTER — Telehealth: Payer: Self-pay | Admitting: Interventional Cardiology

## 2015-04-09 DIAGNOSIS — I10 Essential (primary) hypertension: Secondary | ICD-10-CM

## 2015-04-09 LAB — BASIC METABOLIC PANEL
BUN: 12 mg/dL (ref 7–25)
CHLORIDE: 98 mmol/L (ref 98–110)
CO2: 23 mmol/L (ref 20–31)
Calcium: 9.4 mg/dL (ref 8.6–10.3)
Creat: 0.96 mg/dL (ref 0.70–1.25)
Glucose, Bld: 152 mg/dL — ABNORMAL HIGH (ref 65–99)
POTASSIUM: 4.3 mmol/L (ref 3.5–5.3)
Sodium: 130 mmol/L — ABNORMAL LOW (ref 135–146)

## 2015-04-09 NOTE — Telephone Encounter (Signed)
Walk in pt form-BP readings dropped off-gave to [employer]

## 2015-04-13 ENCOUNTER — Telehealth: Payer: Self-pay

## 2015-04-13 NOTE — Telephone Encounter (Signed)
Spoke with pt. Pt was recently seen by Dr.Smith. He discussed  Having chest discomfort at night. Pt had a recnt o/v with his pcp Dr.Griffin who started him on anti reflex medication. Pt sts that it has helped and he no longer has the chest discomfort at night. Pt would like Dr.Smith updated, adv pt I will fwd Dr.Smith an FYI. Pt voiced appreciation.

## 2015-04-24 ENCOUNTER — Ambulatory Visit (INDEPENDENT_AMBULATORY_CARE_PROVIDER_SITE_OTHER): Payer: Medicare Other | Admitting: Physician Assistant

## 2015-04-24 ENCOUNTER — Encounter: Payer: Self-pay | Admitting: Physician Assistant

## 2015-04-24 VITALS — BP 178/60 | HR 66 | Ht 66.0 in | Wt 175.4 lb

## 2015-04-24 DIAGNOSIS — I25709 Atherosclerosis of coronary artery bypass graft(s), unspecified, with unspecified angina pectoris: Secondary | ICD-10-CM | POA: Diagnosis not present

## 2015-04-24 DIAGNOSIS — I5042 Chronic combined systolic (congestive) and diastolic (congestive) heart failure: Secondary | ICD-10-CM

## 2015-04-24 DIAGNOSIS — I10 Essential (primary) hypertension: Secondary | ICD-10-CM

## 2015-04-24 DIAGNOSIS — E785 Hyperlipidemia, unspecified: Secondary | ICD-10-CM

## 2015-04-24 DIAGNOSIS — R0683 Snoring: Secondary | ICD-10-CM

## 2015-04-24 MED ORDER — QUINAPRIL HCL 40 MG PO TABS: ORAL_TABLET | ORAL | Status: DC

## 2015-04-24 MED ORDER — ATORVASTATIN CALCIUM 80 MG PO TABS: 80.0000 mg | ORAL_TABLET | Freq: Every day | ORAL | Status: DC

## 2015-04-24 NOTE — Patient Instructions (Addendum)
Medication Instructions:  1. AN RX FOR LIPITOR 80 MG WAS SENT IN 2. INCREASE ACCUPRIL TO 40 MG IN THE MORNING AND 20 MG IN THE EVENING Labwork: BMET Monday 05/04/15 7:30-5PM Testing/Procedures: Your physician has recommended that you have a SPLIT NIGHT sleep study. This test records several body functions during sleep, including: brain activity, eye movement, oxygen and carbon dioxide blood levels, heart rate and rhythm, breathing rate and rhythm, the flow of air through your mouth and nose, snoring, body muscle movements, and chest and belly movement. BETHANY COOK WILL CALL YOU TO SET UP Follow-Up: Richardson Dopp, [facility] 06/11/15 @ 10:15 Any Other Special Instructions Will Be Listed Below (If Applicable). If you need a refill on your cardiac medications before your next appointment, please call your pharmacy.

## 2015-04-24 NOTE — Progress Notes (Signed)
Cardiology Office Note:    Date:  04/24/2015   ID:  [name] [name], DOB [birth year], MRN [id number]  PCP:  Irven Shelling, MD  Cardiologist:  Dr. Daneen Schick   Electrophysiologist:  n/a PV:  Dr. Kathlyn Sacramento   Referring MD: [name], MD   Chief Complaint  Patient presents with  . Follow-up    HTN    History of Present Illness:     [name] [name] is a 65 y.o. male with a hx of CAD status post CABG in 2010, subsequent PCI with DES to Dx and OM2 in 2012 secondary to bypass graft failure, diastolic CHF, HTN, HL, diabetes, PAD. LHC 8/16 demonstrated 3 vessel CAD and chronic occlusion of the SVG-ramus and SVG-diagonal. Other grafts were patent. Anginal symptoms were treated with ranolazine. His RLE claudication is treated with a walking program. Last seen by Dr. Fletcher Anon 12/16.  Last seen by Dr. Tamala Julian 03/26/15. Blood pressure was uncontrolled. He was placed on HCTZ 3 days a week and asked to return today for follow-up on blood pressure.  He has a hx of chronic hyponatremia.  Na level on recent labs was ok.  Since last seen, he is doing ok.  He has occasional anginal symptoms. No significant change. He denies significant dyspnea. Denies orthopnea or PND. Denies significant edema. Denies syncope. Blood pressures at home range 660 up to 630 systolic. He does have a history of snoring as well as daytime hypersomnolence.   Past Medical History  Diagnosis Date  . Coronary artery disease     a. s/p MI in 1989;  b. 04/2008 CABG x 5 (LIMA->LAD, VG->D1, VG->RI, VG->PDA, L Radial->OM2);  c. 06/2010 PCI/DES to the Diag (2.25x28 Promus Element DES). VG->Diag occluded;  d. 08/2010 Attempted PTCA of the OM2 via the free radial graft. Unsuccessful.  Lesion felt to be 2/2 suture.  c. NSTEMI s/p [facility] with severe native and graph dz; D1 that was stented in 2012 now occulded: Rx therapy recommended  . Type II diabetes mellitus ([place])   . Hypertension   . Claudication ([place])     a. 08/2014 ABI: R 0.79, L  1.0-->Walking program instituted.  . Chronic combined systolic and diastolic CHF (congestive heart failure) ([place])   . Hyperlipidemia   . Chest pain     a. experience weekly, nitrate responsive c/p x several years.   b. ranexa added on 08/2014 admission     Past Surgical History  Procedure Laterality Date  . Coronary artery bypass graft    . Coronary angioplasty with stent placement    . Shoulder surgery Right 7-8 years ago    Dr. Dorna Leitz  . Colonoscopy    . Shoulder arthroscopy with rotator cuff repair and subacromial decompression Left 05/23/2013    Procedure: LEFT SHOULDER ARTHROSCOPY SUBACROMIAL  DECOMPRESSION DISTAL CLAVICLE RESECTION AND ROTATOR CUFF REPAIR ;  Surgeon: Marin Shutter, MD;  Location: [place];  Service: Orthopedics;  Laterality: Left;  . Cardiac catheterization N/A 08/28/2014    Procedure: Left Heart Cath and Cors/Grafts Angiography;  Surgeon: Belva Crome, MD;  Location: [place] [facility];  Service: Cardiovascular;  Laterality: N/A;    Current Medications: Outpatient Prescriptions Prior to Visit  Medication Sig Dispense Refill  . acetaminophen (TYLENOL) 500 MG tablet Take 500 mg by mouth daily as needed for mild pain.    Marland Kitchen ALPRAZolam (XANAX) 0.25 MG tablet Take 0.25 mg by mouth 3 (three) times daily as needed. For anxiety     . Aromatic  Inhalants (VICKS VAPOR INHALER IN) Inhale into the lungs daily as needed (for wheezing).     Marland Kitchen aspirin 81 MG tablet Take 81 mg by mouth daily. Take 81 mg every morning    . CARTIA XT 180 MG 24 hr capsule TAKE TWO CAPSULES BY MOUTH ONCE DAILY AT BEDTIME 180 capsule 0  . carvedilol (COREG) 6.25 MG tablet Take 1 tablet (6.25 mg total) by mouth 2 (two) times daily. 180 tablet 1  . cholecalciferol (VITAMIN D) 1000 UNITS tablet Take 1,000 Units by mouth at bedtime.     . clopidogrel (PLAVIX) 75 MG tablet Take 75 mg by mouth daily with breakfast.    . desoximetasone (TOPICORT) 0.25 % cream Apply 1 application topically 2 (two) times  daily.    Marland Kitchen ezetimibe (ZETIA) 10 MG tablet Take 1 tablet (10 mg total) by mouth daily. 90 tablet 1  . fluticasone (FLONASE) 50 MCG/ACT nasal spray Place 2 sprays into the nose daily as needed. For nasal congestion    . hydrochlorothiazide (MICROZIDE) 12.5 MG capsule Take 1 capsule (12.5 mg total) by mouth every Monday, Wednesday, and Friday. 45 capsule 2  . isosorbide mononitrate (IMDUR) 120 MG 24 hr tablet Take 120 mg by mouth daily.    . nitroGLYCERIN (NITROSTAT) 0.4 MG SL tablet Place 0.4 mg under the tongue every 5 (five) minutes x 3 doses as needed. For chest pain    . Polyvinyl Alcohol-Povidone (CLEAR EYES ALL SEASONS OP) Place 1 drop into both eyes daily as needed (for dry eyes).    . ranolazine (RANEXA) 500 MG 12 hr tablet Take 1 tablet (500 mg total) by mouth 2 (two) times daily. 180 tablet 2  . sitaGLIPtin (JANUVIA) 100 MG tablet Take 100 mg by mouth at bedtime.     Marland Kitchen atorvastatin (LIPITOR) 40 MG tablet Take 2 tablets (80 mg total) by mouth daily at 6 PM. 180 tablet 1  . quinapril (ACCUPRIL) 40 MG tablet Take 1 tablet (40 mg total) by mouth at bedtime. 90 tablet 1   No facility-administered medications prior to visit.     Allergies:   Avandia and Codeine   Social History   Social History  . Marital Status: Married    Spouse Name: N/A  . Number of Children: N/A  . Years of Education: N/A   Social History Main Topics  . Smoking status: Former Smoker    Quit date: 01/04/1987  . Smokeless tobacco: Former Systems developer  . Alcohol Use: 1.2 oz/week    2 Cans of beer per week     Comment: a week  . Drug Use: No  . Sexual Activity: Not Asked   Other Topics Concern  . None   Social History Narrative   Lives in [place] with his wife.  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     Family History:  The patient's family history includes Anesthesia problems in his brother; Diabetes in his brother; Heart Problems in his brother, brother, and sister; Heart attack in his father; Heart disease in  his mother; Heart failure in his mother; Hypertension in his brother; Kidney failure in his brother; Lung cancer in his mother.   ROS:   Please see the history of present illness.    ROS All other systems reviewed and are negative.   Physical Exam:    VS:  BP 178/60 mmHg  Pulse 66  Ht '5\' 6"'$  (1.676 m)  Wt 175 lb 6.4 oz (79.561 kg)  BMI 28.32 kg/m2  GEN: Well nourished, well developed, in no acute distress HEENT: normal Neck: no JVD, no masses Cardiac: Normal S1/S2, RRR; no murmurs, rubs, or gallops, no edema;     Respiratory:  clear to auscultation bilaterally; no wheezing, rhonchi or rales GI: soft, nontender, nondistended MS: no deformity or atrophy Skin: warm and dry Neuro: No focal deficits  Psych: Alert and oriented x 3, normal affect  Wt Readings from Last 3 Encounters:  04/24/15 175 lb 6.4 oz (79.561 kg)  03/26/15 171 lb 3.2 oz (77.656 kg)  12/16/14 179 lb (81.194 kg)      Studies/Labs Reviewed:     EKG:  EKG is not ordered today.  The ekg ordered today demonstrates n/a  Recent Labs: 08/28/2014: ALT 20; Hemoglobin 15.3; Platelets 167 04/09/2015: BUN 12; Creat 0.96; Potassium 4.3; Sodium 130*   Recent Lipid Panel    Component Value Date/Time   CHOL 160 08/28/2014 0610   TRIG 108 08/28/2014 0610   HDL 39* 08/28/2014 0610   CHOLHDL 4.1 08/28/2014 0610   VLDL 22 08/28/2014 0610   LDLCALC 99 08/28/2014 0610    Additional studies/ records that were reviewed today include:    LHC 8/16 LM 50% LAD ostial 70%, D1 100% ISR RI 99% LCx ostial 100%, OM1 90% RCA proximal 60%, distal 100% SVG-ramus 100% Radial-OM1 patent SVG-D1 1 100% LIMA-LAD patent SVG-RPDA 40% EF 40-50% Medical therapy  LE Vascular US 7/16 R 0.79; L1.0  Event monitor 10/14 No significant bradycardia or tachyarrhythmia   ASSESSMENT:     1. Essential hypertension   2. Coronary artery disease involving coronary bypass graft of native heart with unspecified angina pectoris   3.  Hyperlipidemia   4. Chronic combined systolic and diastolic CHF (congestive heart failure) ([place])   5. Snoring     PLAN:     In order of problems listed above:  1. HTN - Blood pressure somewhat improved. However, his systolic readings remain above target. He does have a history of snoring and may have sleep apnea. He is on maximum dose isosorbide. His heart rate will not tolerate an increase in his beta blocker or calcium channel blocker. With his history of hyponatremia, I do not think we should further adjust his hydrochlorothiazide. Maximum dose of quinapril is 80 mg a day. I have asked him to change this to 40 mg in the morning and 20 mg in the evening. Obtain follow-up BMET in one week. I can see him back again in 6-8 weeks.  May need to consider Hydralazine if BP remains above target.    2. CAD - Stable angina. Continue aspirin, statin, Plavix, calcium channel blocker, nitrates, ranolazine.  3. HL - Continue statin.  4. Combined systolic and diastolic CHF - Volume stable.  Continue current Rx.   5. Snoring - He may have sleep apnea. Arrange split night sleep study. If sleep apnea is identified, treatment of this will help with control of his blood pressure.   Medication Adjustments/Labs and Tests Ordered: Current medicines are reviewed at length with the patient today.  Concerns regarding medicines are outlined above.  Medication changes, Labs and Tests ordered today are outlined in the Patient Instructions noted below. Patient Instructions  Medication Instructions:  1. AN RX FOR LIPITOR 80 MG WAS SENT IN 2. INCREASE ACCUPRIL TO 40 MG IN THE MORNING AND 20 MG IN THE EVENING Labwork: BMET Monday 05/04/15 7:30-5PM Testing/Procedures: Your physician has recommended that you have a SPLIT NIGHT sleep study. This test records several body  functions during sleep, including: brain activity, eye movement, oxygen and carbon dioxide blood levels, heart rate and rhythm, breathing rate and rhythm,  the flow of air through your mouth and nose, snoring, body muscle movements, and chest and belly movement. BETHANY COOK WILL CALL YOU TO SET UP Follow-Up: Richardson Dopp, [facility] 06/11/15 @ 10:15 Any Other Special Instructions Will Be Listed Below (If Applicable). If you need a refill on your cardiac medications before your next appointment, please call your pharmacy.   Signed, Richardson Dopp, PA-C  04/24/2015 1:27 PM    [facility] [place], [place], [place]  [address] Phone: [phone]; Fax: [phone]

## 2015-05-04 ENCOUNTER — Other Ambulatory Visit (INDEPENDENT_AMBULATORY_CARE_PROVIDER_SITE_OTHER): Payer: Medicare Other

## 2015-05-04 DIAGNOSIS — I10 Essential (primary) hypertension: Secondary | ICD-10-CM

## 2015-05-04 LAB — BASIC METABOLIC PANEL
BUN: 12 mg/dL (ref 7–25)
CALCIUM: 9 mg/dL (ref 8.6–10.3)
CO2: 23 mmol/L (ref 20–31)
Chloride: 101 mmol/L (ref 98–110)
Creat: 0.92 mg/dL (ref 0.70–1.25)
GLUCOSE: 117 mg/dL — AB (ref 65–99)
POTASSIUM: 4.5 mmol/L (ref 3.5–5.3)
Sodium: 134 mmol/L — ABNORMAL LOW (ref 135–146)

## 2015-05-18 ENCOUNTER — Telehealth: Payer: Self-pay | Admitting: Cardiovascular Disease

## 2015-05-18 NOTE — Telephone Encounter (Signed)
I spoke with the pt and made him aware that I received a fax form [facility] in regards to a medication clarification request.  They questioned if the pt is taking Lisinopril HCT 20/12.'5mg'$  and quinapril '40mg'$ .  I contacted the pt and he is not taking Lisinopril HCT.  I will fax form back to [facility] to discontinue Lisinopril HCT.

## 2015-05-18 NOTE — Telephone Encounter (Signed)
New message    Patient called stating the nurse just called him -returning call back

## 2015-06-10 DIAGNOSIS — R0683 Snoring: Secondary | ICD-10-CM | POA: Insufficient documentation

## 2015-06-10 NOTE — Progress Notes (Signed)
Cardiology Office Note:    Date:  06/11/2015   ID:  [name] [name], DOB 1950-08-04, MRN [id number]  PCP:  Irven Shelling, MD  Cardiologist: Dr. Daneen Schick  Electrophysiologist: n/a PV: Dr. Kathlyn Sacramento   Referring MD: [name], MD   No chief complaint on file.   History of Present Illness:     [name] [name] is a 65 y.o. male with a hx of CAD status post CABG in 2010, subsequent PCI with DES to Dx and OM2 in 2012 secondary to bypass graft failure, diastolic CHF, HTN, HL, diabetes, PAD. LHC 8/16 demonstrated 3 vessel CAD and chronic occlusion of the SVG-ramus and SVG-diagonal. Other grafts were patent. Anginal symptoms were treated with ranolazine. His RLE claudication is treated with a walking program. Last seen by Dr. Fletcher Anon 12/16.   Last seen by Dr. Tamala Julian in 3/17 and he was placed on HCTZ 3 days a week.  He has a hx of chronic hyponatremia. I saw him in 4/17 for FU.  BP was above target and I increased his Quinapril to 40 mg in A and 20 mg in P.  He noted a history of snoring as well as daytime hypersomnolence.  I ordered a sleep study.  He returns for FU.  He is tracking his blood pressures at home. They range anywhere from 128/59 up to 168/67. He exercises daily without chest symptoms. However sometimes when he [place], he does get anginal symptoms. He has used nitroglycerin little bit more than usual recently. He denies rest symptoms. Denies dyspnea. Denies orthopnea, PND or edema. Denies syncope. Right leg claudication is stable.   Past Medical History  Diagnosis Date  . Coronary artery disease     a. s/p MI in 1989;  b. 04/2008 CABG x 5 (LIMA->LAD, VG->D1, VG->RI, VG->PDA, L Radial->OM2);  c. 06/2010 PCI/DES to the Diag (2.25x28 Promus Element DES). VG->Diag occluded;  d. 08/2010 Attempted PTCA of the OM2 via the free radial graft. Unsuccessful.  Lesion felt to be 2/2 suture.  c. NSTEMI s/p [facility] with severe native and graph dz; D1 that was stented in 2012  now occulded: Rx therapy recommended  . Type II diabetes mellitus ([place])   . Hypertension   . Claudication ([place])     a. 08/2014 ABI: R 0.79, L 1.0-->Walking program instituted.  . Chronic combined systolic and diastolic CHF (congestive heart failure) ([place])   . Hyperlipidemia   . Chest pain     a. experience weekly, nitrate responsive c/p x several years.   b. ranexa added on 08/2014 admission     Past Surgical History  Procedure Laterality Date  . Coronary artery bypass graft    . Coronary angioplasty with stent placement    . Shoulder surgery Right 7-8 years ago    Dr. Dorna Leitz  . Colonoscopy    . Shoulder arthroscopy with rotator cuff repair and subacromial decompression Left 05/23/2013    Procedure: LEFT SHOULDER ARTHROSCOPY SUBACROMIAL  DECOMPRESSION DISTAL CLAVICLE RESECTION AND ROTATOR CUFF REPAIR ;  Surgeon: Marin Shutter, MD;  Location: [facility];  Service: Orthopedics;  Laterality: Left;  . Cardiac catheterization N/A 08/28/2014    Procedure: Left Heart Cath and Cors/Grafts Angiography;  Surgeon: Belva Crome, MD;  Location: [facility];  Service: Cardiovascular;  Laterality: N/A;    Current Medications: Outpatient Prescriptions Prior to Visit  Medication Sig Dispense Refill  . acetaminophen (TYLENOL) 500 MG tablet Take 500 mg by mouth daily as needed  for mild pain.    Marland Kitchen ALPRAZolam (XANAX) 0.25 MG tablet Take 0.25 mg by mouth 3 (three) times daily as needed. For anxiety     . Aromatic Inhalants (VICKS VAPOR INHALER IN) Inhale into the lungs daily as needed (for wheezing).     Marland Kitchen aspirin 81 MG tablet Take 81 mg by mouth daily. Take 81 mg every morning    . CARTIA XT 180 MG 24 hr capsule TAKE TWO CAPSULES BY MOUTH ONCE DAILY AT BEDTIME 180 capsule 0  . carvedilol (COREG) 6.25 MG tablet Take 1 tablet (6.25 mg total) by mouth 2 (two) times daily. 180 tablet 1  . cholecalciferol (VITAMIN D) 1000 UNITS tablet Take 1,000 Units by mouth at bedtime.     . clopidogrel (PLAVIX) 75  MG tablet Take 75 mg by mouth daily with breakfast.    . desoximetasone (TOPICORT) 0.25 % cream Apply 1 application topically 2 (two) times daily.    Marland Kitchen ezetimibe (ZETIA) 10 MG tablet Take 1 tablet (10 mg total) by mouth daily. 90 tablet 1  . fluticasone (FLONASE) 50 MCG/ACT nasal spray Place 2 sprays into the nose daily as needed. For nasal congestion    . hydrochlorothiazide (MICROZIDE) 12.5 MG capsule Take 1 capsule (12.5 mg total) by mouth every Monday, Wednesday, and Friday. 45 capsule 2  . isosorbide mononitrate (IMDUR) 120 MG 24 hr tablet Take 120 mg by mouth daily.    . nitroGLYCERIN (NITROSTAT) 0.4 MG SL tablet Place 0.4 mg under the tongue every 5 (five) minutes x 3 doses as needed. For chest pain    . Polyvinyl Alcohol-Povidone (CLEAR EYES ALL SEASONS OP) Place 1 drop into both eyes daily as needed (for dry eyes).    . quinapril (ACCUPRIL) 40 MG tablet Take 1 tab in the AM (40 mg) and 1/2 tab in the PM (20 mg) 45 tablet 11  . ranolazine (RANEXA) 500 MG 12 hr tablet Take 1 tablet (500 mg total) by mouth 2 (two) times daily. 180 tablet 2  . sitaGLIPtin (JANUVIA) 100 MG tablet Take 100 mg by mouth at bedtime.     Marland Kitchen atorvastatin (LIPITOR) 80 MG tablet Take 1 tablet (80 mg total) by mouth daily at 6 PM. (Patient not taking: Reported on 06/11/2015) 90 tablet 3   No facility-administered medications prior to visit.      Allergies:   Avandia and Codeine   Social History   Social History  . Marital Status: Married    Spouse Name: N/A  . Number of Children: N/A  . Years of Education: N/A   Social History Main Topics  . Smoking status: Former Smoker    Quit date: 01/04/1987  . Smokeless tobacco: Former Systems developer  . Alcohol Use: 1.2 oz/week    2 Cans of beer per week     Comment: a week  . Drug Use: No  . Sexual Activity: Not Asked   Other Topics Concern  . None   Social History Narrative   Lives in [place] with his wife.  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     Family History:  The  patient's family history includes Anesthesia problems in his brother; Diabetes in his brother; Heart Problems in his brother, brother, and sister; Heart attack in his father; Heart disease in his mother; Heart failure in his mother; Hypertension in his brother; Kidney failure in his brother; Lung cancer in his mother.   ROS:   Please see the history of present illness.  ROS All other systems reviewed and are negative.   Physical Exam:    VS:  BP 160/58 mmHg  Pulse 56  Ht '5\' 6"'$  (1.676 m)  Wt 175 lb 6.4 oz (79.561 kg)  BMI 28.32 kg/m2   Physical Exam  Constitutional: He is oriented to person, place, and time. He appears well-developed and well-nourished.  HENT:  Head: Normocephalic and atraumatic.  Neck: Normal range of motion. No JVD present.  Cardiovascular: Normal rate, regular rhythm and normal heart sounds.   No murmur heard. Pulmonary/Chest: Effort normal and breath sounds normal. He has no wheezes. He has no rales.  Abdominal: Soft. There is no tenderness.  Musculoskeletal: Normal range of motion. He exhibits no edema.  Neurological: He is alert and oriented to person, place, and time.  Skin: Skin is warm and dry.  Psychiatric: He has a normal mood and affect.    Wt Readings from Last 3 Encounters:  06/11/15 175 lb 6.4 oz (79.561 kg)  04/24/15 175 lb 6.4 oz (79.561 kg)  03/26/15 171 lb 3.2 oz (77.656 kg)      Studies/Labs Reviewed:     EKG:  EKG is  ordered today.  The ekg ordered today demonstrates Sinus bradycardia, HR 55, normal axis  Recent Labs: 08/28/2014: ALT 20; Hemoglobin 15.3; Platelets 167 05/04/2015: BUN 12; Creat 0.92; Potassium 4.5; Sodium 134*   Recent Lipid Panel    Component Value Date/Time   CHOL 160 08/28/2014 0610   TRIG 108 08/28/2014 0610   HDL 39* 08/28/2014 0610   CHOLHDL 4.1 08/28/2014 0610   VLDL 22 08/28/2014 0610   LDLCALC 99 08/28/2014 0610    Additional studies/ records that were reviewed today include:   LHC 8/16 LM 50% LAD  ostial 70%, D1 100% ISR RI 99% LCx ostial 100%, OM1 90% RCA proximal 60%, distal 100% SVG-ramus 100% Radial-OM1 patent SVG-D1 1 100% LIMA-LAD patent SVG-RPDA 40% EF 40-50% Medical therapy  LE Vascular US 7/16 R 0.79; L1.0  Event monitor 10/14 No significant bradycardia or tachyarrhythmia   ASSESSMENT:     1. Essential hypertension   2. Coronary artery disease involving native coronary artery of native heart with angina pectoris ([place])   3. Hyperlipidemia   4. Chronic combined systolic and diastolic CHF (congestive heart failure) ([place])   5. Snoring   6. Peripheral arterial disease ([place])     PLAN:     In order of problems listed above:  1. HTN - BP improved. However, he still has readings above target. He has noted some escalation in anginal symptoms recently. Maximum dose of isosorbide is 240 mg a day. Therefore, I will increase his isosorbide to 150 mg daily. Continue current dose of carvedilol, diltiazem, hydrochlorothiazide and Accupril.  2. CAD - As noted, he has noted some increasing symptoms of angina recently. No unstable symptoms. Continue aspirin, statin, Plavix, calcium channel blocker, ranolazine. Increase isosorbide as noted above. Return for follow-up in 3 months. He knows to return sooner if symptoms continue to escalate.   3. HL - Continue statin.  4. Combined systolic and diastolic CHF - Volume stable. Continue current Rx.   5. Snoring - Sleep study pending  6. PAD - He has follow-up with Dr. Fletcher Anon next week.   Medication Adjustments/Labs and Tests Ordered: Current medicines are reviewed at length with the patient today.  Concerns regarding medicines are outlined above.  Medication changes, Labs and Tests ordered today are outlined in the Patient Instructions noted below. Patient Instructions  Medication Instructions:  INCREASE ISOSORBIDE TO 150 MG ONCE DAILY= 1 OF THE 120 MG TABLET AND 1 OF THE 30 MG TABLETS ONCE DAILY Follow-Up: Your physician  recommends that you schedule a follow-up appointment in: [place]le, Richardson Dopp, PA-C  06/11/2015 10:57 AM    [facility] [address], [place], [place]  [address] Phone: [phone]; Fax: [phone]

## 2015-06-11 ENCOUNTER — Encounter: Payer: Self-pay | Admitting: Physician Assistant

## 2015-06-11 ENCOUNTER — Ambulatory Visit (INDEPENDENT_AMBULATORY_CARE_PROVIDER_SITE_OTHER): Payer: Medicare Other | Admitting: Physician Assistant

## 2015-06-11 VITALS — BP 160/58 | HR 56 | Ht 66.0 in | Wt 175.4 lb

## 2015-06-11 DIAGNOSIS — E785 Hyperlipidemia, unspecified: Secondary | ICD-10-CM | POA: Diagnosis not present

## 2015-06-11 DIAGNOSIS — I10 Essential (primary) hypertension: Secondary | ICD-10-CM | POA: Diagnosis not present

## 2015-06-11 DIAGNOSIS — I25119 Atherosclerotic heart disease of native coronary artery with unspecified angina pectoris: Secondary | ICD-10-CM

## 2015-06-11 DIAGNOSIS — I5042 Chronic combined systolic (congestive) and diastolic (congestive) heart failure: Secondary | ICD-10-CM

## 2015-06-11 DIAGNOSIS — I739 Peripheral vascular disease, unspecified: Secondary | ICD-10-CM

## 2015-06-11 DIAGNOSIS — R0683 Snoring: Secondary | ICD-10-CM

## 2015-06-11 MED ORDER — ISOSORBIDE MONONITRATE ER 30 MG PO TB24: 30.0000 mg | ORAL_TABLET | Freq: Every day | ORAL | Status: DC

## 2015-06-11 NOTE — Patient Instructions (Addendum)
Medication Instructions:  INCREASE ISOSORBIDE TO 150 MG ONCE DAILY= 1 OF THE 120 MG TABLET AND 1 OF THE 30 MG TABLETS ONCE DAILY Follow-Up: Your physician recommends that you schedule a follow-up appointment in: [place] Tamala Julian

## 2015-06-15 ENCOUNTER — Encounter (HOSPITAL_COMMUNITY): Payer: Self-pay | Admitting: Emergency Medicine

## 2015-06-15 ENCOUNTER — Emergency Department (HOSPITAL_COMMUNITY)
Admission: EM | Admit: 2015-06-15 | Discharge: 2015-06-15 | Disposition: A | Discharge status: Home / Self Care | Payer: Medicare Other | Attending: Emergency Medicine | Admitting: Emergency Medicine

## 2015-06-15 DIAGNOSIS — E785 Hyperlipidemia, unspecified: Secondary | ICD-10-CM | POA: Insufficient documentation

## 2015-06-15 DIAGNOSIS — T18108A Unspecified foreign body in esophagus causing other injury, initial encounter: Secondary | ICD-10-CM

## 2015-06-15 DIAGNOSIS — X58XXXA Exposure to other specified factors, initial encounter: Secondary | ICD-10-CM | POA: Diagnosis not present

## 2015-06-15 DIAGNOSIS — I2510 Atherosclerotic heart disease of native coronary artery without angina pectoris: Secondary | ICD-10-CM | POA: Diagnosis not present

## 2015-06-15 DIAGNOSIS — I5042 Chronic combined systolic (congestive) and diastolic (congestive) heart failure: Secondary | ICD-10-CM | POA: Diagnosis not present

## 2015-06-15 DIAGNOSIS — R1110 Vomiting, unspecified: Secondary | ICD-10-CM | POA: Insufficient documentation

## 2015-06-15 DIAGNOSIS — Y999 Unspecified external cause status: Secondary | ICD-10-CM | POA: Diagnosis not present

## 2015-06-15 DIAGNOSIS — Z7984 Long term (current) use of oral hypoglycemic drugs: Secondary | ICD-10-CM | POA: Insufficient documentation

## 2015-06-15 DIAGNOSIS — Z79899 Other long term (current) drug therapy: Secondary | ICD-10-CM | POA: Diagnosis not present

## 2015-06-15 DIAGNOSIS — Y929 Unspecified place or not applicable: Secondary | ICD-10-CM | POA: Insufficient documentation

## 2015-06-15 DIAGNOSIS — I110 Hypertensive heart disease with heart failure: Secondary | ICD-10-CM | POA: Diagnosis not present

## 2015-06-15 DIAGNOSIS — E119 Type 2 diabetes mellitus without complications: Secondary | ICD-10-CM | POA: Insufficient documentation

## 2015-06-15 DIAGNOSIS — Y939 Activity, unspecified: Secondary | ICD-10-CM | POA: Insufficient documentation

## 2015-06-15 DIAGNOSIS — R079 Chest pain, unspecified: Secondary | ICD-10-CM | POA: Diagnosis not present

## 2015-06-15 DIAGNOSIS — T18190A Other foreign object in esophagus causing compression of trachea, initial encounter: Secondary | ICD-10-CM | POA: Diagnosis not present

## 2015-06-15 DIAGNOSIS — Z87891 Personal history of nicotine dependence: Secondary | ICD-10-CM | POA: Insufficient documentation

## 2015-06-15 DIAGNOSIS — Z7982 Long term (current) use of aspirin: Secondary | ICD-10-CM | POA: Insufficient documentation

## 2015-06-15 DIAGNOSIS — T189XXA Foreign body of alimentary tract, part unspecified, initial encounter: Secondary | ICD-10-CM | POA: Diagnosis present

## 2015-06-15 NOTE — ED Notes (Signed)
Bed: WA15 Expected date:  Expected time:  Means of arrival:  Comments: Hold for triage 2 

## 2015-06-15 NOTE — Discharge Instructions (Signed)
Swallowed Foreign Body, Adult A swallowed foreign body is an object that gets stuck in the tube that connects your throat to your stomach (esophagus) or in another part of your digestive tract. Foreign bodies may be swallowed by accident or on purpose. When you swallow an object, it passes into your esophagus. The narrowest place in your digestive system is where your esophagus meets your stomach. If the object can pass through that place, it will usually continue through the rest of your digestive system without causing problems. A foreign body that gets stuck may need to be removed. It is very important to tell your health care provider what you have swallowed. Certain swallowed items can be life-threatening. You may need emergency treatment. Dangerous swallowed foreign bodies include:  Objects that get stuck in your throat.  Objects that interfere with your breathing.  Sharp objects.  Harmful objects, such as batteries or illegal drugs. CAUSES The most common swallowed foreign body is food that will not pass through your esophagus to your stomach (food impaction). Foods that commonly become impacted include meats and hard vegetables, such as carrots and radishes. Other common swallowed foreign bodies include:  Pieces of bone from meats.  Toothpicks.  Dentures. RISK FACTORS You are more likely to have a swallowed foreign body if:  You wear dentures.  You have been drinking alcohol or taking drugs.  You have a mental health condition.  You have a narrowed or scarred area in your digestive tract. SYMPTOMS  Pain or pressure in your throat or chest.  Not being able to swallow food or liquid.  Not being able to swallow your saliva.  Choking.  Hoarse voice.  Trouble breathing. DIAGNOSIS This condition may be diagnosed based on your symptoms and medical history. Your health care provider will do a physical exam to confirm the diagnosis and to find the object. Imaging studies  may be done, including:  X-rays.  A CT scan. Some objects may not be seen on imaging studies. In those cases, an exam may be done using a long tubelike scope to look into your esophagus (endoscopy). The tube (endoscope) that is used for this exam may be stiff (rigid) or flexible, depending on where the foreign body is stuck. TREATMENT Usually, an object that has passed into your stomach but is not dangerous will pass out of your digestive system without treatment. If the swallowed object is not dangerous but it is stuck in your esophagus:  You may be given medicine to relax the muscles of your esophagus to allow the object to pass through.  Endoscopy may be done to find and remove the object if it does not pass with medicine. Your health care provider will put medical instruments through the endoscope to remove the object. You may need emergency treatment if:  The object is in your esophagus and is causing you to inhale saliva into your lungs (aspirate).  The object is in your esophagus and is pressing on your airway. This makes it hard for you to breathe.  The object can damage your digestive tract. Some objects that can cause damage include batteries, magnets, sharp objects, and drugs. HOME CARE INSTRUCTIONS If the object in your digestive system is expected to pass:  Continue eating what you usually eat, unless your health care provider gives you different instructions.  Check your stool after every bowel movement to see if you have passed the object.  Contact your health care provider if the object has not passed after 3  days. If you had endoscopic surgery to remove the foreign body:  Follow instructions from your health care provider about caring for yourself after the procedure. Keep all follow-up visits and repeat imaging tests as told by your health care provider. This is important. SEEK MEDICAL CARE IF:  You continue to have symptoms of a swallowed foreign body.  The  object has not passed out of your body after 3 days. SEEK IMMEDIATE MEDICAL CARE IF:  You have a fever.  You have pain in your chest or your abdomen.  You cough up blood.  You have blood in your stool (feces) or your vomit.   This information is not intended to replace advice given to you by your health care provider. Make sure you discuss any questions you have with your health care provider.   Document Released: 06/09/2009 Document Revised: 09/10/2014 Document Reviewed: 03/19/2014 Elsevier Interactive Patient Education [employer].

## 2015-06-15 NOTE — ED Notes (Signed)
Pt states that he was eating chicken at a restaurant this afternoon and became choked, unable to get the chicken to fully pass. He states that he is unable to swallow water at this point. Alert and oriented. Airway intact.

## 2015-06-15 NOTE — ED Notes (Signed)
Pt states his throat pain is getting worse.  Vomiting "foam."

## 2015-06-15 NOTE — ED Provider Notes (Signed)
CSN: [id number]     Arrival date & time 06/15/15  1454 History   First MD Initiated Contact with Patient 06/15/15 1807     Chief Complaint  Patient presents with  . Swallowed Foreign Body     (Consider location/radiation/quality/duration/timing/severity/associated sxs/prior Treatment) Patient is a 64 y.o. male presenting with GI illness. The history is provided by the patient.  GI Problem This is a new problem. The current episode started 1 to 2 hours ago. The problem occurs constantly. The problem has not changed since onset.Associated symptoms include chest pain (lower feeling of food stuck). Pertinent negatives include no abdominal pain. Nothing aggravates the symptoms. Nothing relieves the symptoms. He has tried nothing for the symptoms.    Past Medical History  Diagnosis Date  . Coronary artery disease     a. s/p MI in 1989;  b. 04/2008 CABG x 5 (LIMA->LAD, VG->D1, VG->RI, VG->PDA, L Radial->OM2);  c. 06/2010 PCI/DES to the Diag (2.25x28 Promus Element DES). VG->Diag occluded;  d. 08/2010 Attempted PTCA of the OM2 via the free radial graft. Unsuccessful.  Lesion felt to be 2/2 suture.  c. NSTEMI s/p [facility] with severe native and graph dz; D1 that was stented in 2012 now occulded: Rx therapy recommended  . Type II diabetes mellitus ([place])   . Hypertension   . Claudication ([place])     a. 08/2014 ABI: R 0.79, L 1.0-->Walking program instituted.  . Chronic combined systolic and diastolic CHF (congestive heart failure) ([place])   . Hyperlipidemia   . Chest pain     a. experience weekly, nitrate responsive c/p x several years.   b. ranexa added on 08/2014 admission    Past Surgical History  Procedure Laterality Date  . Coronary artery bypass graft    . Coronary angioplasty with stent placement    . Shoulder surgery Right 7-8 years ago    Dr. Dorna Leitz  . Colonoscopy    . Shoulder arthroscopy with rotator cuff repair and subacromial decompression Left 05/23/2013    Procedure: LEFT SHOULDER  ARTHROSCOPY SUBACROMIAL  DECOMPRESSION DISTAL CLAVICLE RESECTION AND ROTATOR CUFF REPAIR ;  Surgeon: Marin Shutter, MD;  Location: [facility];  Service: Orthopedics;  Laterality: Left;  . Cardiac catheterization N/A 08/28/2014    Procedure: Left Heart Cath and Cors/Grafts Angiography;  Surgeon: Belva Crome, MD;  Location: [facility];  Service: Cardiovascular;  Laterality: N/A;   Family History  Problem Relation Age of Onset  . Heart Problems Brother     heart valve replacement  . Anesthesia problems Brother   . Heart disease Mother   . Lung cancer Mother   . Heart attack Father   . Heart Problems Sister     had heart valve replacement  . Hypertension Brother   . Diabetes Brother   . Kidney failure Brother   . Heart Problems Brother   . Heart failure Mother    Social History  Substance Use Topics  . Smoking status: Former Smoker    Quit date: 01/04/1987  . Smokeless tobacco: Former Systems developer  . Alcohol Use: 1.2 oz/week    2 Cans of beer per week     Comment: a week    Review of Systems  Cardiovascular: Positive for chest pain (lower feeling of food stuck).  Gastrointestinal: Positive for vomiting. Negative for abdominal pain.  All other systems reviewed and are negative.     Allergies  Avandia and Codeine  Home Medications   Prior to Admission medications  Medication Sig Start Date End Date Taking? Authorizing Provider  acetaminophen (TYLENOL) 500 MG tablet Take 500 mg by mouth daily as needed for mild pain.   Yes Historical Provider, MD  ALPRAZolam (XANAX) 0.25 MG tablet Take 0.25 mg by mouth 3 (three) times daily as needed. For anxiety    Yes Historical Provider, MD  aspirin 81 MG tablet Take 81 mg by mouth daily. Take 81 mg every morning   Yes Historical Provider, MD  atorvastatin (LIPITOR) 40 MG tablet Take 80 mg by mouth at bedtime.    Yes Historical Provider, MD  CARTIA XT 180 MG 24 hr capsule TAKE TWO CAPSULES BY MOUTH ONCE DAILY AT BEDTIME 03/10/15  Yes Wellington Hampshire, MD  carvedilol (COREG) 6.25 MG tablet Take 1 tablet (6.25 mg total) by mouth 2 (two) times daily. 01/29/15  Yes Wellington Hampshire, MD  cholecalciferol (VITAMIN D) 1000 UNITS tablet Take 1,000 Units by mouth daily.    Yes Historical Provider, MD  clopidogrel (PLAVIX) 75 MG tablet Take 75 mg by mouth daily with breakfast.   Yes Historical Provider, MD  desoximetasone (TOPICORT) 0.25 % cream Apply 1 application topically 2 (two) times daily.   Yes Historical Provider, MD  ezetimibe (ZETIA) 10 MG tablet Take 1 tablet (10 mg total) by mouth daily. 01/29/15  Yes Wellington Hampshire, MD  fluticasone (FLONASE) 50 MCG/ACT nasal spray Place 2 sprays into the nose daily. For nasal congestion   Yes Historical Provider, MD  hydrochlorothiazide (MICROZIDE) 12.5 MG capsule Take 1 capsule (12.5 mg total) by mouth every Monday, Wednesday, and Friday. 04/06/15  Yes Belva Crome, MD  isosorbide mononitrate (IMDUR) 30 MG 24 hr tablet Take 1 tablet (30 mg total) by mouth daily. 06/11/15  Yes Scott T Kathlen Mody, PA-C  metFORMIN (GLUCOPHAGE) 1000 MG tablet Take 1,000 mg by mouth 2 (two) times daily with a meal.   Yes Historical Provider, MD  nitroGLYCERIN (NITROSTAT) 0.4 MG SL tablet Place 0.4 mg under the tongue every 5 (five) minutes x 3 doses as needed. For chest pain   Yes Historical Provider, MD  quinapril (ACCUPRIL) 40 MG tablet Take 1 tab in the AM (40 mg) and 1/2 tab in the PM (20 mg) 04/24/15  Yes Liliane Shi, PA-C  ranolazine (RANEXA) 500 MG 12 hr tablet Take 1 tablet (500 mg total) by mouth 2 (two) times daily. 04/07/15  Yes Belva Crome, MD  sitaGLIPtin (JANUVIA) 100 MG tablet Take 100 mg by mouth daily.    Yes Historical Provider, MD  traMADol (ULTRAM) 50 MG tablet Take 50 mg by mouth every 6 (six) hours as needed for moderate pain or severe pain.   Yes Historical Provider, MD  Aromatic Inhalants (VICKS VAPOR INHALER IN) Inhale into the lungs daily as needed (for wheezing).     Historical Provider, MD  Polyvinyl  Alcohol-Povidone (CLEAR EYES ALL SEASONS OP) Place 1 drop into both eyes daily as needed (for dry eyes).    Historical Provider, MD   BP 148/63 mmHg  Pulse 69  Temp(Src) 99.2 F (37.3 C) (Oral)  Resp 14  SpO2 99% Physical Exam  Constitutional: He is oriented to person, place, and time. He appears well-developed and well-nourished. No distress.  HENT:  Head: Normocephalic and atraumatic.  Eyes: Conjunctivae are normal.  Neck: Neck supple. No tracheal deviation present.  Cardiovascular: Normal rate, regular rhythm and normal heart sounds.   Pulmonary/Chest: Effort normal and breath sounds normal. No respiratory distress.  Abdominal: Soft.  He exhibits no distension. There is no tenderness. There is no rebound and no guarding.  Neurological: He is alert and oriented to person, place, and time.  Skin: Skin is warm and dry.  Psychiatric: He has a normal mood and affect.  Vitals reviewed.   ED Course  Procedures (including critical care time) Labs Review Labs Reviewed - No data to display  Imaging Review No results found. I have personally reviewed and evaluated these images and lab results as part of my medical decision-making.   EKG Interpretation   Date/Time:  Monday June 15 2015 19:18:59 EDT Ventricular Rate:  67 PR Interval:  163 QRS Duration: 87 QT Interval:  427 QTC Calculation: 451 R Axis:   51 Text Interpretation:  Sinus rhythm Nonspecific T abnormalities, lateral  leads improved since previous tracing Confirmed by Navia Lindahl MD, Quillian Quince  ([id number]) on 06/15/2015 7:35:08 PM      MDM   Final diagnoses:  Esophageal foreign body, initial encounter    65 y.o. male presents with central chest pressure and vomiting up white foam after he feels like a large bite of chicken has gotten stuck. Managing secretions appropriately here. Screening EKG for ACS is unremarkable and appears to be related to esophageal FB which is stuck. Pt given saltines and carbonated beverages and noted  a complete relief of pain suggesting passage of chicken from esophagus. Pt has h/o GERD and is at risk for esophageal strictures. OP GI f/u recommended and Pt is to comply with soft diet and small bites until able to get scope non-emergently. Plan to follow up with PCP as needed and return precautions discussed for worsening or new concerning symptoms.     Leo Grosser, MD 06/16/15 0120

## 2015-06-16 ENCOUNTER — Ambulatory Visit (INDEPENDENT_AMBULATORY_CARE_PROVIDER_SITE_OTHER): Payer: Medicare Other | Admitting: Cardiovascular Disease

## 2015-06-16 VITALS — BP 146/72 | HR 61 | Ht 66.0 in | Wt 175.0 lb

## 2015-06-16 DIAGNOSIS — I739 Peripheral vascular disease, unspecified: Secondary | ICD-10-CM

## 2015-06-16 NOTE — Patient Instructions (Signed)
Medication Instructions:  Your physician recommends that you continue on your current medications as directed. Please refer to the Current Medication list given to you today.   Labwork: Your physician recommends that you return for lab work in: 07/01/15 - At [address] location The lab can be found on the FIRST FLOOR of out building in [address]   Testing/Procedures: You are scheduled for abdominal aortogram on 07/15/15 with Dr. Fletcher Anon - See Letter for details    Any Other Special Instructions Will Be Listed Below (If Applicable).     If you need a refill on your cardiac medications before your next appointment, please call your pharmacy.

## 2015-06-16 NOTE — Progress Notes (Signed)
Cardiology Office Note   Date:  06/16/2015   ID:  [name] [name], DOB 03-Feb-1950, MRN [id number]  PCP:  Irven Shelling, MD  Cardiologist: Dr. Tamala Julian  No chief complaint on file.     History of Present Illness: [name] [name] is a 65 y.o. male who presents for a follow-up visit regarding peripheral arterial disease. He has known history of coronary artery disease status post CABG, chronic diastolic heart failure, hypertension, type 2 diabetes and hyperlipidemia.  He is being followed for right lower extremity claudication . noninvasive vascular evaluation showed an ABI of 0.79 on the right and 1.0 on the left. Duplex showed bilateral common iliac artery stenosis greater on the right with a peak velocity of 458. There was also severe right popliteal artery stenosis with a peak velocity of 425 and three-vessel runoff bilaterally.  The patient attempted medical therapy and a walking program. He has done back over the last 6 months. Initially has some improvement in his symptoms. However, he reports worsening right calf claudication which has significantly limited his ability to perform activities of daily living. He is looking for some relief after he attempted the walking program for more than 6 months. He went to the emergency room yesterday at after a piece of chicken stuck in his esophagus. He was treated conservatively and was able to pass it down. He is going to see gastroenterology.   Past Medical History  Diagnosis Date  . Coronary artery disease     a. s/p MI in 1989;  b. 04/2008 CABG x 5 (LIMA->LAD, VG->D1, VG->RI, VG->PDA, L Radial->OM2);  c. 06/2010 PCI/DES to the Diag (2.25x28 Promus Element DES). VG->Diag occluded;  d. 08/2010 Attempted PTCA of the OM2 via the free radial graft. Unsuccessful.  Lesion felt to be 2/2 suture.  c. NSTEMI s/p [facility] with severe native and graph dz; D1 that was stented in 2012 now occulded: Rx therapy recommended  . Type II diabetes mellitus ([place])   .  Hypertension   . Claudication ([place])     a. 08/2014 ABI: R 0.79, L 1.0-->Walking program instituted.  . Chronic combined systolic and diastolic CHF (congestive heart failure) ([place])   . Hyperlipidemia   . Chest pain     a. experience weekly, nitrate responsive c/p x several years.   b. ranexa added on 08/2014 admission     Past Surgical History  Procedure Laterality Date  . Coronary artery bypass graft    . Coronary angioplasty with stent placement    . Shoulder surgery Right 7-8 years ago    Dr. Dorna Leitz  . Colonoscopy    . Shoulder arthroscopy with rotator cuff repair and subacromial decompression Left 05/23/2013    Procedure: LEFT SHOULDER ARTHROSCOPY SUBACROMIAL  DECOMPRESSION DISTAL CLAVICLE RESECTION AND ROTATOR CUFF REPAIR ;  Surgeon: Marin Shutter, MD;  Location: [place];  Service: Orthopedics;  Laterality: Left;  . Cardiac catheterization N/A 08/28/2014    Procedure: Left Heart Cath and Cors/Grafts Angiography;  Surgeon: Belva Crome, MD;  Location: [place] [facility];  Service: Cardiovascular;  Laterality: N/A;     Current Outpatient Prescriptions  Medication Sig Dispense Refill  . acetaminophen (TYLENOL) 500 MG tablet Take 500 mg by mouth daily as needed for mild pain.    Marland Kitchen ALPRAZolam (XANAX) 0.25 MG tablet Take 0.25 mg by mouth 3 (three) times daily as needed. For anxiety     . Aromatic Inhalants (VICKS VAPOR INHALER IN) Inhale into the lungs daily as needed (  for wheezing).     Marland Kitchen aspirin 81 MG tablet Take 81 mg by mouth daily. Take 81 mg every morning    . atorvastatin (LIPITOR) 40 MG tablet Take 80 mg by mouth at bedtime.     Marland Kitchen CARTIA XT 180 MG 24 hr capsule TAKE TWO CAPSULES BY MOUTH ONCE DAILY AT BEDTIME 180 capsule 0  . carvedilol (COREG) 6.25 MG tablet Take 1 tablet (6.25 mg total) by mouth 2 (two) times daily. 180 tablet 1  . cholecalciferol (VITAMIN D) 1000 UNITS tablet Take 1,000 Units by mouth daily.     . clopidogrel (PLAVIX) 75 MG tablet Take 75 mg by mouth daily  with breakfast.    . desoximetasone (TOPICORT) 0.25 % cream Apply 1 application topically 2 (two) times daily.    Marland Kitchen ezetimibe (ZETIA) 10 MG tablet Take 1 tablet (10 mg total) by mouth daily. 90 tablet 1  . fluticasone (FLONASE) 50 MCG/ACT nasal spray Place 2 sprays into the nose daily. For nasal congestion    . hydrochlorothiazide (MICROZIDE) 12.5 MG capsule Take 1 capsule (12.5 mg total) by mouth every Monday, Wednesday, and Friday. 45 capsule 2  . isosorbide mononitrate (IMDUR) 30 MG 24 hr tablet Take 1 tablet (30 mg total) by mouth daily. 90 tablet 3  . metFORMIN (GLUCOPHAGE) 1000 MG tablet Take 1,000 mg by mouth 2 (two) times daily with a meal.    . nitroGLYCERIN (NITROSTAT) 0.4 MG SL tablet Place 0.4 mg under the tongue every 5 (five) minutes x 3 doses as needed. For chest pain    . Polyvinyl Alcohol-Povidone (CLEAR EYES ALL SEASONS OP) Place 1 drop into both eyes daily as needed (for dry eyes).    . quinapril (ACCUPRIL) 40 MG tablet Take 1 tab in the AM (40 mg) and 1/2 tab in the PM (20 mg) 45 tablet 11  . ranolazine (RANEXA) 500 MG 12 hr tablet Take 1 tablet (500 mg total) by mouth 2 (two) times daily. 180 tablet 2  . sitaGLIPtin (JANUVIA) 100 MG tablet Take 100 mg by mouth daily.     . traMADol (ULTRAM) 50 MG tablet Take 50 mg by mouth every 6 (six) hours as needed for moderate pain or severe pain.     No current facility-administered medications for this visit.    Allergies:   Avandia and Codeine    Social History:  The patient  reports that he quit smoking about 28 years ago. He has quit using smokeless tobacco. He reports that he drinks about 1.2 oz of alcohol per week. He reports that he does not use illicit drugs.   Family History:  The patient's family history includes Anesthesia problems in his brother; Diabetes in his brother; Heart Problems in his brother, brother, and sister; Heart attack in his father; Heart disease in his mother; Heart failure in his mother; Hypertension in  his brother; Kidney failure in his brother; Lung cancer in his mother.    ROS:  Please see the history of present illness.   Otherwise, review of systems are positive for none.   All other systems are reviewed and negative.    PHYSICAL EXAM: VS:  BP 146/72 mmHg  Pulse 61  Ht '5\' 6"'$  (1.676 m)  Wt 175 lb (79.379 kg)  BMI 28.26 kg/m2 , BMI Body mass index is 28.26 kg/(m^2). GEN: Well nourished, well developed, in no acute distress HEENT: normal Neck: no JVD, carotid bruits, or masses Cardiac: RRR; no murmurs, rubs, or gallops,no edema  Respiratory:  clear to auscultation bilaterally, normal work of breathing GI: soft, nontender, nondistended, + BS MS: no deformity or atrophy Skin: warm and dry, no rash Neuro:  Strength and sensation are intact Psych: euthymic mood, full affect Vascular: Femoral pulses are normal bilaterally.  EKG:  EKG is not ordered today.    Recent Labs: 08/28/2014: ALT 20; Hemoglobin 15.3; Platelets 167 05/04/2015: BUN 12; Creat 0.92; Potassium 4.5; Sodium 134*    Lipid Panel    Component Value Date/Time   CHOL 160 08/28/2014 0610   TRIG 108 08/28/2014 0610   HDL 39* 08/28/2014 0610   CHOLHDL 4.1 08/28/2014 0610   VLDL 22 08/28/2014 0610   LDLCALC 99 08/28/2014 0610      Wt Readings from Last 3 Encounters:  06/16/15 175 lb (79.379 kg)  06/11/15 175 lb 6.4 oz (79.561 kg)  04/24/15 175 lb 6.4 oz (79.561 kg)        ASSESSMENT AND PLAN:  1.  Peripheral arterial disease with severe lifestyle limiting right leg claudication: The patient attempted a walking program for more than 6 months initially with some improvement but he had recent worsening of symptoms. Due to that, I recommend proceeding with abdominal aortogram, lower extremity runoff and possible endovascular intervention. Most of his symptoms seems to be calf claudication. Plan access is via left femoral artery in case we need to treat the right popliteal stenosis.  2. Coronary artery disease:  Currently with no significant angina.  3. Essential hypertension: Blood pressure is reasonably controlled on current medications.  4. Hyperlipidemia: Continue treatment with atorvastatin. Most recent LDL was 99.    Disposition:   FU with me in 1 month  Signed,  Kathlyn Sacramento, MD  06/16/2015 4:45 PM    [facility]

## 2015-06-19 ENCOUNTER — Other Ambulatory Visit: Payer: Self-pay | Admitting: *Deleted

## 2015-06-19 DIAGNOSIS — I10 Essential (primary) hypertension: Secondary | ICD-10-CM

## 2015-06-19 MED ORDER — ISOSORBIDE MONONITRATE ER 30 MG PO TB24
30.0000 mg | ORAL_TABLET | Freq: Every day | ORAL | Status: DC
Start: 2015-06-19 — End: 2015-09-11

## 2015-06-23 ENCOUNTER — Other Ambulatory Visit: Payer: Self-pay | Admitting: Cardiovascular Disease

## 2015-06-23 NOTE — Telephone Encounter (Signed)
Review for refill, Thank you. 

## 2015-06-25 NOTE — Telephone Encounter (Signed)
Please advise 

## 2015-07-01 ENCOUNTER — Other Ambulatory Visit (INDEPENDENT_AMBULATORY_CARE_PROVIDER_SITE_OTHER): Payer: Medicare Other | Admitting: *Deleted

## 2015-07-01 DIAGNOSIS — I739 Peripheral vascular disease, unspecified: Secondary | ICD-10-CM

## 2015-07-01 DIAGNOSIS — I10 Essential (primary) hypertension: Secondary | ICD-10-CM | POA: Diagnosis not present

## 2015-07-01 DIAGNOSIS — I2583 Coronary atherosclerosis due to lipid rich plaque: Secondary | ICD-10-CM

## 2015-07-01 DIAGNOSIS — I2510 Atherosclerotic heart disease of native coronary artery without angina pectoris: Secondary | ICD-10-CM

## 2015-07-01 LAB — CBC WITH DIFFERENTIAL/PLATELET
BASOS PCT: 0 %
Basophils Absolute: 0 cells/uL (ref 0–200)
EOS PCT: 4 %
Eosinophils Absolute: 368 cells/uL (ref 15–500)
HCT: 38.9 % (ref 38.5–50.0)
HEMOGLOBIN: 13.5 g/dL (ref 13.2–17.1)
LYMPHS ABS: 2944 {cells}/uL (ref 850–3900)
LYMPHS PCT: 32 %
MCH: 31.3 pg (ref 27.0–33.0)
MCHC: 34.7 g/dL (ref 32.0–36.0)
MCV: 90.3 fL (ref 80.0–100.0)
MONO ABS: 920 {cells}/uL (ref 200–950)
MPV: 11.4 fL (ref 7.5–12.5)
Monocytes Relative: 10 %
NEUTROS PCT: 54 %
Neutro Abs: 4968 cells/uL (ref 1500–7800)
Platelets: 191 10*3/uL (ref 140–400)
RBC: 4.31 MIL/uL (ref 4.20–5.80)
RDW: 13.2 % (ref 11.0–15.0)
WBC: 9.2 10*3/uL (ref 3.8–10.8)

## 2015-07-01 LAB — BASIC METABOLIC PANEL
BUN: 15 mg/dL (ref 7–25)
CALCIUM: 9.2 mg/dL (ref 8.6–10.3)
CO2: 23 mmol/L (ref 20–31)
Chloride: 94 mmol/L — ABNORMAL LOW (ref 98–110)
Creat: 0.99 mg/dL (ref 0.70–1.25)
GLUCOSE: 113 mg/dL — AB (ref 65–99)
POTASSIUM: 5 mmol/L (ref 3.5–5.3)
SODIUM: 123 mmol/L — AB (ref 135–146)

## 2015-07-01 LAB — APTT: APTT: 29 s (ref 22–34)

## 2015-07-01 LAB — PROTIME-INR
INR: 1.1
PROTHROMBIN TIME: 11.5 s (ref 9.0–11.5)

## 2015-07-01 NOTE — Addendum Note (Signed)
Addended by: Eulis Foster on: 07/01/2015 09:18 AM   Modules accepted: Orders

## 2015-07-06 ENCOUNTER — Telehealth: Payer: Self-pay | Admitting: Cardiovascular Disease

## 2015-07-06 DIAGNOSIS — E871 Hypo-osmolality and hyponatremia: Secondary | ICD-10-CM

## 2015-07-06 NOTE — Telephone Encounter (Signed)
Notes Recorded by Wellington Hampshire, MD on 07/02/2015 at 6:00 PM Pre-angiogram labs showed worsening hyponatremia. Stop HCTZ. Repeat BMP in 1 week. We can still proceed with the angiogram.   I spoke with the pt and made him aware of lab results. The pt will Stop HCTZ at this time and have a repeat BMP on 07/13/15.

## 2015-07-06 NOTE — Telephone Encounter (Signed)
Left message on machine for pt to contact the office.   

## 2015-07-06 NOTE — Telephone Encounter (Signed)
Pt returned call to [place] [facility] call [phone]

## 2015-07-13 ENCOUNTER — Other Ambulatory Visit (INDEPENDENT_AMBULATORY_CARE_PROVIDER_SITE_OTHER): Payer: Medicare Other | Admitting: *Deleted

## 2015-07-13 DIAGNOSIS — E871 Hypo-osmolality and hyponatremia: Secondary | ICD-10-CM

## 2015-07-13 LAB — BASIC METABOLIC PANEL
BUN: 13 mg/dL (ref 7–25)
CHLORIDE: 96 mmol/L — AB (ref 98–110)
CO2: 20 mmol/L (ref 20–31)
CREATININE: 1.01 mg/dL (ref 0.70–1.25)
Calcium: 9 mg/dL (ref 8.6–10.3)
GLUCOSE: 167 mg/dL — AB (ref 65–99)
POTASSIUM: 4.7 mmol/L (ref 3.5–5.3)
Sodium: 126 mmol/L — ABNORMAL LOW (ref 135–146)

## 2015-07-15 ENCOUNTER — Ambulatory Visit (HOSPITAL_COMMUNITY)
Admission: RE | Admit: 2015-07-15 | Discharge: 2015-07-15 | Disposition: A | Discharge status: Home / Self Care | Payer: Medicare Other | Source: Ambulatory Visit | Attending: Cardiovascular Disease | Admitting: Cardiovascular Disease

## 2015-07-15 ENCOUNTER — Encounter (HOSPITAL_COMMUNITY)
Admission: RE | Disposition: A | Discharge status: Home / Self Care | Payer: Self-pay | Source: Ambulatory Visit | Attending: Cardiovascular Disease

## 2015-07-15 DIAGNOSIS — I252 Old myocardial infarction: Secondary | ICD-10-CM | POA: Diagnosis not present

## 2015-07-15 DIAGNOSIS — I70213 Atherosclerosis of native arteries of extremities with intermittent claudication, bilateral legs: Secondary | ICD-10-CM | POA: Diagnosis not present

## 2015-07-15 DIAGNOSIS — I2510 Atherosclerotic heart disease of native coronary artery without angina pectoris: Secondary | ICD-10-CM | POA: Insufficient documentation

## 2015-07-15 DIAGNOSIS — Z87891 Personal history of nicotine dependence: Secondary | ICD-10-CM | POA: Diagnosis not present

## 2015-07-15 DIAGNOSIS — Z7902 Long term (current) use of antithrombotics/antiplatelets: Secondary | ICD-10-CM | POA: Insufficient documentation

## 2015-07-15 DIAGNOSIS — I701 Atherosclerosis of renal artery: Secondary | ICD-10-CM | POA: Diagnosis not present

## 2015-07-15 DIAGNOSIS — I739 Peripheral vascular disease, unspecified: Secondary | ICD-10-CM

## 2015-07-15 DIAGNOSIS — I5042 Chronic combined systolic (congestive) and diastolic (congestive) heart failure: Secondary | ICD-10-CM | POA: Insufficient documentation

## 2015-07-15 DIAGNOSIS — E785 Hyperlipidemia, unspecified: Secondary | ICD-10-CM | POA: Diagnosis not present

## 2015-07-15 DIAGNOSIS — Z955 Presence of coronary angioplasty implant and graft: Secondary | ICD-10-CM | POA: Insufficient documentation

## 2015-07-15 DIAGNOSIS — Z801 Family history of malignant neoplasm of trachea, bronchus and lung: Secondary | ICD-10-CM | POA: Diagnosis not present

## 2015-07-15 DIAGNOSIS — I110 Hypertensive heart disease with heart failure: Secondary | ICD-10-CM | POA: Insufficient documentation

## 2015-07-15 DIAGNOSIS — Z7984 Long term (current) use of oral hypoglycemic drugs: Secondary | ICD-10-CM | POA: Diagnosis not present

## 2015-07-15 DIAGNOSIS — Z951 Presence of aortocoronary bypass graft: Secondary | ICD-10-CM | POA: Insufficient documentation

## 2015-07-15 DIAGNOSIS — E1151 Type 2 diabetes mellitus with diabetic peripheral angiopathy without gangrene: Secondary | ICD-10-CM | POA: Insufficient documentation

## 2015-07-15 DIAGNOSIS — Z833 Family history of diabetes mellitus: Secondary | ICD-10-CM | POA: Insufficient documentation

## 2015-07-15 DIAGNOSIS — Z7982 Long term (current) use of aspirin: Secondary | ICD-10-CM | POA: Insufficient documentation

## 2015-07-15 DIAGNOSIS — Z8249 Family history of ischemic heart disease and other diseases of the circulatory system: Secondary | ICD-10-CM | POA: Diagnosis not present

## 2015-07-15 HISTORY — PX: PERIPHERAL VASCULAR CATHETERIZATION: SHX172C

## 2015-07-15 LAB — POCT I-STAT, CHEM 8
BUN: 11 mg/dL (ref 6–20)
CALCIUM ION: 1.24 mmol/L — AB (ref 1.12–1.23)
CHLORIDE: 99 mmol/L — AB (ref 101–111)
Creatinine, Ser: 1 mg/dL (ref 0.61–1.24)
GLUCOSE: 136 mg/dL — AB (ref 65–99)
HCT: 42 % (ref 39.0–52.0)
Hemoglobin: 14.3 g/dL (ref 13.0–17.0)
Potassium: 4.7 mmol/L (ref 3.5–5.1)
SODIUM: 132 mmol/L — AB (ref 135–145)
TCO2: 23 mmol/L (ref 0–100)

## 2015-07-15 LAB — GLUCOSE, CAPILLARY
GLUCOSE-CAPILLARY: 133 mg/dL — AB (ref 65–99)
GLUCOSE-CAPILLARY: 139 mg/dL — AB (ref 65–99)

## 2015-07-15 LAB — POCT ACTIVATED CLOTTING TIME
ACTIVATED CLOTTING TIME: 186 s
Activated Clotting Time: 219 seconds

## 2015-07-15 SURGERY — ABDOMINAL AORTOGRAM W/LOWER EXTREMITY
Anesthesia: LOCAL

## 2015-07-15 MED ORDER — FENTANYL CITRATE (PF) 100 MCG/2ML IJ SOLN
INTRAMUSCULAR | Status: DC | PRN
  Administered 2015-07-15 (×2): 25 ug via INTRAVENOUS

## 2015-07-15 MED ORDER — FENTANYL CITRATE (PF) 100 MCG/2ML IJ SOLN
INTRAMUSCULAR | Status: AC
  Filled 2015-07-15: qty 2

## 2015-07-15 MED ORDER — HEPARIN SODIUM (PORCINE) 1000 UNIT/ML IJ SOLN
INTRAMUSCULAR | Status: AC
  Filled 2015-07-15: qty 1

## 2015-07-15 MED ORDER — SODIUM CHLORIDE 0.9 % IV SOLN: 250.0000 mL | INTRAVENOUS | Status: DC | PRN

## 2015-07-15 MED ORDER — SODIUM CHLORIDE 0.9% FLUSH: 3.0000 mL | INTRAVENOUS | Status: DC | PRN

## 2015-07-15 MED ORDER — MIDAZOLAM HCL 2 MG/2ML IJ SOLN
INTRAMUSCULAR | Status: AC
  Filled 2015-07-15: qty 2

## 2015-07-15 MED ORDER — ACETAMINOPHEN 325 MG PO TABS
ORAL_TABLET | ORAL | Status: AC
  Administered 2015-07-15: 650 mg via ORAL
  Filled 2015-07-15: qty 2

## 2015-07-15 MED ORDER — HEPARIN (PORCINE) IN NACL 2-0.9 UNIT/ML-% IJ SOLN
INTRAMUSCULAR | Status: AC
  Filled 2015-07-15: qty 1000

## 2015-07-15 MED ORDER — IODIXANOL 320 MG/ML IV SOLN
INTRAVENOUS | Status: DC | PRN
  Administered 2015-07-15: 210 mL via INTRA_ARTERIAL

## 2015-07-15 MED ORDER — ASPIRIN 81 MG PO CHEW: 81.0000 mg | CHEWABLE_TABLET | ORAL | Status: DC

## 2015-07-15 MED ORDER — SODIUM CHLORIDE 0.9% FLUSH: 3.0000 mL | Freq: Two times a day (BID) | INTRAVENOUS | Status: DC

## 2015-07-15 MED ORDER — MIDAZOLAM HCL 2 MG/2ML IJ SOLN
INTRAMUSCULAR | Status: DC | PRN
  Administered 2015-07-15 (×2): 1 mg via INTRAVENOUS

## 2015-07-15 MED ORDER — LIDOCAINE HCL (PF) 1 % IJ SOLN
INTRAMUSCULAR | Status: AC
  Filled 2015-07-15: qty 30

## 2015-07-15 MED ORDER — SODIUM CHLORIDE 0.9 % IV SOLN: INTRAVENOUS | Status: AC

## 2015-07-15 MED ORDER — LIDOCAINE HCL (PF) 1 % IJ SOLN
INTRAMUSCULAR | Status: DC | PRN
  Administered 2015-07-15: 30 mL via SUBCUTANEOUS

## 2015-07-15 MED ORDER — ACETAMINOPHEN 325 MG PO TABS
650.0000 mg | ORAL_TABLET | Freq: Once | ORAL | Status: AC
  Administered 2015-07-15: 650 mg via ORAL

## 2015-07-15 MED ORDER — SODIUM CHLORIDE 0.9 % IV SOLN
INTRAVENOUS | Status: DC
  Administered 2015-07-15: 07:00:00 via INTRAVENOUS

## 2015-07-15 MED ORDER — HEPARIN SODIUM (PORCINE) 1000 UNIT/ML IJ SOLN
INTRAMUSCULAR | Status: DC | PRN
  Administered 2015-07-15: 6000 [IU] via INTRAVENOUS

## 2015-07-15 MED ORDER — HEPARIN (PORCINE) IN NACL 2-0.9 UNIT/ML-% IJ SOLN
INTRAMUSCULAR | Status: DC | PRN
  Administered 2015-07-15: 1000 mL via INTRA_ARTERIAL

## 2015-07-15 SURGICAL SUPPLY — 15 items
CATH ANGIO 5F PIGTAIL 65CM (CATHETERS) ×3 IMPLANT
KIT ENCORE 26 ADVANTAGE (KITS) ×6 IMPLANT
KIT MICROINTRODUCER STIFF 5F (SHEATH) ×3 IMPLANT
KIT PV (KITS) ×3 IMPLANT
SHEATH PINNACLE 5F 10CM (SHEATH) ×3 IMPLANT
SHEATH PINNACLE ST 7F 45CM (SHEATH) ×6 IMPLANT
STENT EXPRESS LD 10X25X75 (Permanent Stent) ×3 IMPLANT
STENT OMNILINK ELITE 10X19X80 (Permanent Stent) ×3 IMPLANT
STOPCOCK MORSE 400PSI 3WAY (MISCELLANEOUS) ×3 IMPLANT
SYRINGE MEDRAD AVANTA MACH 7 (SYRINGE) ×3 IMPLANT
TAPE RADIOPAQUE TURBO (MISCELLANEOUS) ×6 IMPLANT
TRANSDUCER W/STOPCOCK (MISCELLANEOUS) ×3 IMPLANT
TRAY PV CATH (CUSTOM PROCEDURE TRAY) ×3 IMPLANT
TUBING CIL FLEX 10 FLL-RA (TUBING) ×3 IMPLANT
WIRE HITORQ VERSACORE ST 145CM (WIRE) ×6 IMPLANT

## 2015-07-15 NOTE — Interval H&P Note (Signed)
History and Physical Interval Note:  07/15/2015 8:27 AM  [name] [name]  has presented today for surgery, with the diagnosis of pad  The various methods of treatment have been discussed with the patient and family. After consideration of risks, benefits and other options for treatment, the patient has consented to  Procedure(s): Abdominal Aortogram w/Lower Extremity (N/A) as a surgical intervention .  The patient's history has been reviewed, patient examined, no change in status, stable for surgery.  I have reviewed the patient's chart and labs.  Questions were answered to the patient's satisfaction.     Kathlyn Sacramento

## 2015-07-15 NOTE — Progress Notes (Signed)
Site area: lt groin Site Prior to Removal:  Level 0 Pressure Applied For:  20 minutes Manual:   yes Patient Status During Pull:  stable Post Pull Site:  Level  0  Post Pull Instructions Given:  yes Post Pull Pulses Present: yes Dressing Applied:  tegaderm Bedrest begins @  1110 Comments:

## 2015-07-15 NOTE — H&P (View-Only) (Signed)
Cardiology Office Note   Date:  06/16/2015   ID:  [name] [name], DOB Sep 16, 1950, MRN [id number]  PCP:  Irven Shelling, MD  Cardiologist: Dr. Tamala Julian  No chief complaint on file.     History of Present Illness: [name] [name] is a 65 y.o. male who presents for a follow-up visit regarding peripheral arterial disease. He has known history of coronary artery disease status post CABG, chronic diastolic heart failure, hypertension, type 2 diabetes and hyperlipidemia.  He is being followed for right lower extremity claudication . noninvasive vascular evaluation showed an ABI of 0.79 on the right and 1.0 on the left. Duplex showed bilateral common iliac artery stenosis greater on the right with a peak velocity of 458. There was also severe right popliteal artery stenosis with a peak velocity of 425 and three-vessel runoff bilaterally.  The patient attempted medical therapy and a walking program. He has done back over the last 6 months. Initially has some improvement in his symptoms. However, he reports worsening right calf claudication which has significantly limited his ability to perform activities of daily living. He is looking for some relief after he attempted the walking program for more than 6 months. He went to the emergency room yesterday at after a piece of chicken stuck in his esophagus. He was treated conservatively and was able to pass it down. He is going to see gastroenterology.   Past Medical History  Diagnosis Date  . Coronary artery disease     a. s/p MI in 1989;  b. 04/2008 CABG x 5 (LIMA->LAD, VG->D1, VG->RI, VG->PDA, L Radial->OM2);  c. 06/2010 PCI/DES to the Diag (2.25x28 Promus Element DES). VG->Diag occluded;  d. 08/2010 Attempted PTCA of the OM2 via the free radial graft. Unsuccessful.  Lesion felt to be 2/2 suture.  c. NSTEMI s/p [facility] with severe native and graph dz; D1 that was stented in 2012 now occulded: Rx therapy recommended  . Type II diabetes mellitus ([place])   .  Hypertension   . Claudication ([place])     a. 08/2014 ABI: R 0.79, L 1.0-->Walking program instituted.  . Chronic combined systolic and diastolic CHF (congestive heart failure) ([place])   . Hyperlipidemia   . Chest pain     a. experience weekly, nitrate responsive c/p x several years.   b. ranexa added on 08/2014 admission     Past Surgical History  Procedure Laterality Date  . Coronary artery bypass graft    . Coronary angioplasty with stent placement    . Shoulder surgery Right 7-8 years ago    Dr. Dorna Leitz  . Colonoscopy    . Shoulder arthroscopy with rotator cuff repair and subacromial decompression Left 05/23/2013    Procedure: LEFT SHOULDER ARTHROSCOPY SUBACROMIAL  DECOMPRESSION DISTAL CLAVICLE RESECTION AND ROTATOR CUFF REPAIR ;  Surgeon: Marin Shutter, MD;  Location: [place];  Service: Orthopedics;  Laterality: Left;  . Cardiac catheterization N/A 08/28/2014    Procedure: Left Heart Cath and Cors/Grafts Angiography;  Surgeon: Belva Crome, MD;  Location: [place] [facility];  Service: Cardiovascular;  Laterality: N/A;     Current Outpatient Prescriptions  Medication Sig Dispense Refill  . acetaminophen (TYLENOL) 500 MG tablet Take 500 mg by mouth daily as needed for mild pain.    Marland Kitchen ALPRAZolam (XANAX) 0.25 MG tablet Take 0.25 mg by mouth 3 (three) times daily as needed. For anxiety     . Aromatic Inhalants (VICKS VAPOR INHALER IN) Inhale into the lungs daily as needed (  for wheezing).     Marland Kitchen aspirin 81 MG tablet Take 81 mg by mouth daily. Take 81 mg every morning    . atorvastatin (LIPITOR) 40 MG tablet Take 80 mg by mouth at bedtime.     Marland Kitchen CARTIA XT 180 MG 24 hr capsule TAKE TWO CAPSULES BY MOUTH ONCE DAILY AT BEDTIME 180 capsule 0  . carvedilol (COREG) 6.25 MG tablet Take 1 tablet (6.25 mg total) by mouth 2 (two) times daily. 180 tablet 1  . cholecalciferol (VITAMIN D) 1000 UNITS tablet Take 1,000 Units by mouth daily.     . clopidogrel (PLAVIX) 75 MG tablet Take 75 mg by mouth daily  with breakfast.    . desoximetasone (TOPICORT) 0.25 % cream Apply 1 application topically 2 (two) times daily.    Marland Kitchen ezetimibe (ZETIA) 10 MG tablet Take 1 tablet (10 mg total) by mouth daily. 90 tablet 1  . fluticasone (FLONASE) 50 MCG/ACT nasal spray Place 2 sprays into the nose daily. For nasal congestion    . hydrochlorothiazide (MICROZIDE) 12.5 MG capsule Take 1 capsule (12.5 mg total) by mouth every Monday, Wednesday, and Friday. 45 capsule 2  . isosorbide mononitrate (IMDUR) 30 MG 24 hr tablet Take 1 tablet (30 mg total) by mouth daily. 90 tablet 3  . metFORMIN (GLUCOPHAGE) 1000 MG tablet Take 1,000 mg by mouth 2 (two) times daily with a meal.    . nitroGLYCERIN (NITROSTAT) 0.4 MG SL tablet Place 0.4 mg under the tongue every 5 (five) minutes x 3 doses as needed. For chest pain    . Polyvinyl Alcohol-Povidone (CLEAR EYES ALL SEASONS OP) Place 1 drop into both eyes daily as needed (for dry eyes).    . quinapril (ACCUPRIL) 40 MG tablet Take 1 tab in the AM (40 mg) and 1/2 tab in the PM (20 mg) 45 tablet 11  . ranolazine (RANEXA) 500 MG 12 hr tablet Take 1 tablet (500 mg total) by mouth 2 (two) times daily. 180 tablet 2  . sitaGLIPtin (JANUVIA) 100 MG tablet Take 100 mg by mouth daily.     . traMADol (ULTRAM) 50 MG tablet Take 50 mg by mouth every 6 (six) hours as needed for moderate pain or severe pain.     No current facility-administered medications for this visit.    Allergies:   Avandia and Codeine    Social History:  The patient  reports that he quit smoking about 28 years ago. He has quit using smokeless tobacco. He reports that he drinks about 1.2 oz of alcohol per week. He reports that he does not use illicit drugs.   Family History:  The patient's family history includes Anesthesia problems in his brother; Diabetes in his brother; Heart Problems in his brother, brother, and sister; Heart attack in his father; Heart disease in his mother; Heart failure in his mother; Hypertension in  his brother; Kidney failure in his brother; Lung cancer in his mother.    ROS:  Please see the history of present illness.   Otherwise, review of systems are positive for none.   All other systems are reviewed and negative.    PHYSICAL EXAM: VS:  BP 146/72 mmHg  Pulse 61  Ht '5\' 6"'$  (1.676 m)  Wt 175 lb (79.379 kg)  BMI 28.26 kg/m2 , BMI Body mass index is 28.26 kg/(m^2). GEN: Well nourished, well developed, in no acute distress HEENT: normal Neck: no JVD, carotid bruits, or masses Cardiac: RRR; no murmurs, rubs, or gallops,no edema  Respiratory:  clear to auscultation bilaterally, normal work of breathing GI: soft, nontender, nondistended, + BS MS: no deformity or atrophy Skin: warm and dry, no rash Neuro:  Strength and sensation are intact Psych: euthymic mood, full affect Vascular: Femoral pulses are normal bilaterally.  EKG:  EKG is not ordered today.    Recent Labs: 08/28/2014: ALT 20; Hemoglobin 15.3; Platelets 167 05/04/2015: BUN 12; Creat 0.92; Potassium 4.5; Sodium 134*    Lipid Panel    Component Value Date/Time   CHOL 160 08/28/2014 0610   TRIG 108 08/28/2014 0610   HDL 39* 08/28/2014 0610   CHOLHDL 4.1 08/28/2014 0610   VLDL 22 08/28/2014 0610   LDLCALC 99 08/28/2014 0610      Wt Readings from Last 3 Encounters:  06/16/15 175 lb (79.379 kg)  06/11/15 175 lb 6.4 oz (79.561 kg)  04/24/15 175 lb 6.4 oz (79.561 kg)        ASSESSMENT AND PLAN:  1.  Peripheral arterial disease with severe lifestyle limiting right leg claudication: The patient attempted a walking program for more than 6 months initially with some improvement but he had recent worsening of symptoms. Due to that, I recommend proceeding with abdominal aortogram, lower extremity runoff and possible endovascular intervention. Most of his symptoms seems to be calf claudication. Plan access is via left femoral artery in case we need to treat the right popliteal stenosis.  2. Coronary artery disease:  Currently with no significant angina.  3. Essential hypertension: Blood pressure is reasonably controlled on current medications.  4. Hyperlipidemia: Continue treatment with atorvastatin. Most recent LDL was 99.    Disposition:   FU with me in 1 month  Signed,  Kathlyn Sacramento, MD  06/16/2015 4:45 PM    [facility]

## 2015-07-15 NOTE — Discharge Instructions (Signed)
Resume Metformin after 2 days.  Angiogram, Care After Refer to this sheet in the next few weeks. These instructions provide you with information about caring for yourself after your procedure. Your health care provider may also give you more specific instructions. Your treatment has been planned according to current medical practices, but problems sometimes occur. Call your health care provider if you have any problems or questions after your procedure. WHAT TO EXPECT AFTER THE PROCEDURE After your procedure, it is typical to have the following:  Bruising at the catheter insertion site that usually fades within 1-2 weeks.  Blood collecting in the tissue (hematoma) that may be painful to the touch. It should usually decrease in size and tenderness within 1-2 weeks. HOME CARE INSTRUCTIONS  Take medicines only as directed by your health care provider.  You may shower 24-48 hours after the procedure or as directed by your health care provider. Remove the bandage (dressing) and gently wash the site with plain soap and water. Pat the area dry with a clean towel. Do not rub the site, because this may cause bleeding.  Do not take baths, swim, or use a hot tub until your health care provider approves.  Check your insertion site every day for redness, swelling, or drainage.  Do not apply powder or lotion to the site.  Do not lift over 10 lb (4.5 kg) for 5 days after your procedure or as directed by your health care provider.  Ask your health care provider when it is okay to:  Return to work or school.  Resume usual physical activities or sports.  Resume sexual activity.  Do not drive home if you are discharged the same day as the procedure. Have someone else drive you.  You may drive 24 hours after the procedure unless otherwise instructed by your health care provider.  Do not operate machinery or power tools for 24 hours after the procedure or as directed by your health care provider.  If  your procedure was done as an outpatient procedure, which means that you went home the same day as your procedure, a responsible adult should be with you for the first 24 hours after you arrive home.  Keep all follow-up visits as directed by your health care provider. This is important. SEEK MEDICAL CARE IF:  You have a fever.  You have chills.  You have increased bleeding from the catheter insertion site. Hold pressure on the site. SEEK IMMEDIATE MEDICAL CARE IF:  You have unusual pain at the catheter insertion site.  You have redness, warmth, or swelling at the catheter insertion site.  You have drainage (other than a small amount of blood on the dressing) from the catheter insertion site.  The catheter insertion site is bleeding, and the bleeding does not stop after 30 minutes of holding steady pressure on the site.  The area near or just beyond the catheter insertion site becomes pale, cool, tingly, or numb.   This information is not intended to replace advice given to you by your health care provider. Make sure you discuss any questions you have with your health care provider.   Document Released: 07/08/2004 Document Revised: 01/10/2014 Document Reviewed: 05/23/2012 Elsevier Interactive Patient Education [employer].

## 2015-07-15 NOTE — Progress Notes (Signed)
Site area: rt groin Site Prior to Removal:  Level  0 Pressure Applied For:  20 minutes Manual:   yes Patient Status During Pull:  stable Post Pull Site:  Level  0 Post Pull Instructions Given:  yes Post Pull Pulses Present: yes Dressing Applied:  tegaderm Bedrest begins @  Comments:

## 2015-07-16 ENCOUNTER — Encounter (HOSPITAL_COMMUNITY): Payer: Self-pay | Admitting: Cardiovascular Disease

## 2015-08-03 ENCOUNTER — Other Ambulatory Visit: Payer: Self-pay | Admitting: Cardiovascular Disease

## 2015-08-03 DIAGNOSIS — I739 Peripheral vascular disease, unspecified: Secondary | ICD-10-CM

## 2015-08-11 ENCOUNTER — Ambulatory Visit (HOSPITAL_COMMUNITY)
Admission: RE | Admit: 2015-08-11 | Discharge: 2015-08-11 | Disposition: A | Discharge status: Home / Self Care | Payer: Medicare Other | Source: Ambulatory Visit | Attending: Cardiovascular Disease | Admitting: Cardiovascular Disease

## 2015-08-11 ENCOUNTER — Encounter (INDEPENDENT_AMBULATORY_CARE_PROVIDER_SITE_OTHER): Payer: Self-pay

## 2015-08-11 DIAGNOSIS — I5042 Chronic combined systolic (congestive) and diastolic (congestive) heart failure: Secondary | ICD-10-CM | POA: Insufficient documentation

## 2015-08-11 DIAGNOSIS — I2510 Atherosclerotic heart disease of native coronary artery without angina pectoris: Secondary | ICD-10-CM | POA: Diagnosis not present

## 2015-08-11 DIAGNOSIS — E785 Hyperlipidemia, unspecified: Secondary | ICD-10-CM | POA: Insufficient documentation

## 2015-08-11 DIAGNOSIS — E119 Type 2 diabetes mellitus without complications: Secondary | ICD-10-CM | POA: Insufficient documentation

## 2015-08-11 DIAGNOSIS — I739 Peripheral vascular disease, unspecified: Secondary | ICD-10-CM | POA: Diagnosis not present

## 2015-08-11 DIAGNOSIS — R938 Abnormal findings on diagnostic imaging of other specified body structures: Secondary | ICD-10-CM | POA: Insufficient documentation

## 2015-08-11 DIAGNOSIS — I110 Hypertensive heart disease with heart failure: Secondary | ICD-10-CM | POA: Insufficient documentation

## 2015-08-11 DIAGNOSIS — I708 Atherosclerosis of other arteries: Secondary | ICD-10-CM | POA: Insufficient documentation

## 2015-08-11 DIAGNOSIS — I700 Atherosclerosis of aorta: Secondary | ICD-10-CM | POA: Diagnosis not present

## 2015-08-26 ENCOUNTER — Telehealth: Payer: Self-pay | Admitting: *Deleted

## 2015-08-26 DIAGNOSIS — I739 Peripheral vascular disease, unspecified: Secondary | ICD-10-CM

## 2015-08-26 NOTE — Telephone Encounter (Signed)
Pt notified of Aorto-Iliac Duplex results by phone with verbal understanding. Pt agreeable to plan of care to repeat  Aorto-Iliac Duplex in 6 months. Order placed today. Will have [facility] call pt and schedule.

## 2015-09-11 ENCOUNTER — Encounter: Payer: Self-pay | Admitting: Interventional Cardiology

## 2015-09-11 ENCOUNTER — Ambulatory Visit (INDEPENDENT_AMBULATORY_CARE_PROVIDER_SITE_OTHER): Payer: Medicare Other | Admitting: Interventional Cardiology

## 2015-09-11 VITALS — BP 136/62 | HR 61 | Ht 66.0 in | Wt 175.6 lb

## 2015-09-11 DIAGNOSIS — I5042 Chronic combined systolic (congestive) and diastolic (congestive) heart failure: Secondary | ICD-10-CM

## 2015-09-11 DIAGNOSIS — E785 Hyperlipidemia, unspecified: Secondary | ICD-10-CM

## 2015-09-11 DIAGNOSIS — I25118 Atherosclerotic heart disease of native coronary artery with other forms of angina pectoris: Secondary | ICD-10-CM

## 2015-09-11 DIAGNOSIS — I10 Essential (primary) hypertension: Secondary | ICD-10-CM | POA: Diagnosis not present

## 2015-09-11 DIAGNOSIS — I739 Peripheral vascular disease, unspecified: Secondary | ICD-10-CM

## 2015-09-11 NOTE — Progress Notes (Signed)
Cardiology Office Note    Date:  09/11/2015   ID:  [name] [name], DOB 10-Oct-1950, MRN [id number]  PCP:  Irven Shelling, MD  Cardiologist: Sinclair Grooms, MD   Chief Complaint  Patient presents with  . Coronary Artery Disease    History of Present Illness:  [name] [name] is a 65 y.o. male follow-up CAD, prior history of CABG and percutaneous coronary intervention (most recently 2012), chronic combined systolic and diastolic heart failure (EF at cath 2016 was 40-50%), hypertension, hyperlipidemia, and type 2 diabetes. Recent diagnosis of  He is having stable angina. He denies orthopnea and PND. The major change his been marked improvement in exertional tolerance and ability after lower extremity intervention by Dr. Fletcher Anon. He denies orthopnea. He has not had palpitations or syncope. Is no peripheral edema.  Past Medical History:  Diagnosis Date  . Chest pain    a. experience weekly, nitrate responsive c/p x several years.   b. ranexa added on 08/2014 admission   . Chronic combined systolic and diastolic CHF (congestive heart failure) ([place])   . Claudication ([place])    a. 08/2014 ABI: R 0.79, L 1.0-->Walking program instituted.  . Coronary artery disease    a. s/p MI in 1989;  b. 04/2008 CABG x 5 (LIMA->LAD, VG->D1, VG->RI, VG->PDA, L Radial->OM2);  c. 06/2010 PCI/DES to the Diag (2.25x28 Promus Element DES). VG->Diag occluded;  d. 08/2010 Attempted PTCA of the OM2 via the free radial graft. Unsuccessful.  Lesion felt to be 2/2 suture.  c. NSTEMI s/p [facility] with severe native and graph dz; D1 that was stented in 2012 now occulded: Rx therapy recommended  . Hyperlipidemia   . Hypertension   . Type II diabetes mellitus ([place])     Past Surgical History:  Procedure Laterality Date  . CARDIAC CATHETERIZATION N/A 08/28/2014   Procedure: Left Heart Cath and Cors/Grafts Angiography;  Surgeon: Belva Crome, MD;  Location: [place] [facility];  Service: Cardiovascular;  Laterality: N/A;  .  COLONOSCOPY    . CORONARY ANGIOPLASTY WITH STENT PLACEMENT    . CORONARY ARTERY BYPASS GRAFT    . PERIPHERAL VASCULAR CATHETERIZATION N/A 07/15/2015   Procedure: Abdominal Aortogram w/Lower Extremity;  Surgeon: Wellington Hampshire, MD;  Location: [place] [facility];  Service: Cardiovascular;  Laterality: N/A;  . PERIPHERAL VASCULAR CATHETERIZATION Bilateral 07/15/2015   Procedure: Peripheral Vascular Intervention;  Surgeon: Wellington Hampshire, MD;  Location: [place] [facility];  Service: Cardiovascular;  Laterality: Bilateral;  Common Iliacs  . SHOULDER ARTHROSCOPY WITH ROTATOR CUFF REPAIR AND SUBACROMIAL DECOMPRESSION Left 05/23/2013   Procedure: LEFT SHOULDER ARTHROSCOPY SUBACROMIAL  DECOMPRESSION DISTAL CLAVICLE RESECTION AND ROTATOR CUFF REPAIR ;  Surgeon: Marin Shutter, MD;  Location: [facility];  Service: Orthopedics;  Laterality: Left;  . SHOULDER SURGERY Right 7-8 years ago   Dr. Dorna Leitz    Current Medications: Outpatient Medications Prior to Visit  Medication Sig Dispense Refill  . acetaminophen (TYLENOL) 500 MG tablet Take 500 mg by mouth daily as needed for mild pain.    Marland Kitchen ALPRAZolam (XANAX) 0.25 MG tablet Take 0.25 mg by mouth 3 (three) times daily as needed for anxiety.     . Aromatic Inhalants (VICKS VAPOR INHALER IN) Inhale into the lungs daily as needed (for wheezing).     Marland Kitchen aspirin 81 MG tablet Take 81 mg by mouth daily.     Marland Kitchen atorvastatin (LIPITOR) 40 MG tablet Take 80 mg by mouth at bedtime.     Marland Kitchen  CARTIA XT 180 MG 24 hr capsule TAKE TWO CAPSULES BY MOUTH ONCE DAILY AT BEDTIME 180 capsule 0  . carvedilol (COREG) 6.25 MG tablet Take 1 tablet by mouth two  times daily 180 tablet 3  . cholecalciferol (VITAMIN D) 1000 UNITS tablet Take 1,000 Units by mouth daily.     . clopidogrel (PLAVIX) 75 MG tablet Take 75 mg by mouth daily with breakfast.    . desoximetasone (TOPICORT) 0.25 % cream Apply 1 application topically once a week.     . ezetimibe (ZETIA) 10 MG tablet Take 1 tablet by  mouth  daily 90 tablet 3  . fluticasone (FLONASE) 50 MCG/ACT nasal spray Place 2 sprays into the nose daily. For nasal congestion    . metFORMIN (GLUCOPHAGE) 1000 MG tablet Take 1,000 mg by mouth 2 (two) times daily with a meal.    . nitroGLYCERIN (NITROSTAT) 0.4 MG SL tablet Place 0.4 mg under the tongue every 5 (five) minutes x 3 doses as needed. For chest pain    . Polyvinyl Alcohol-Povidone (CLEAR EYES ALL SEASONS OP) Place 1 drop into both eyes daily as needed (for dry eyes).    . ranolazine (RANEXA) 500 MG 12 hr tablet Take 1 tablet (500 mg total) by mouth 2 (two) times daily. 180 tablet 2  . sitaGLIPtin (JANUVIA) 100 MG tablet Take 100 mg by mouth daily.     . isosorbide mononitrate (IMDUR) 30 MG 24 hr tablet Take 1 tablet (30 mg total) by mouth daily. (Patient not taking: Reported on 09/11/2015) 90 tablet 3  . quinapril (ACCUPRIL) 40 MG tablet Take 1 tablet by mouth at  bedtime (Patient not taking: Reported on 09/11/2015) 90 tablet 3   No facility-administered medications prior to visit.      Allergies:   Avandia [rosiglitazone] and Codeine   Social History   Social History  . Marital status: Married    Spouse name: N/A  . Number of children: N/A  . Years of education: N/A   Social History Main Topics  . Smoking status: Former Smoker    Quit date: 01/04/1987  . Smokeless tobacco: Former Systems developer  . Alcohol use 1.2 oz/week    2 Cans of beer per week     Comment: a week  . Drug use: No  . Sexual activity: Not Asked   Other Topics Concern  . None   Social History Narrative   Lives in [place] with his wife.  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     Family History:  The patient's family history includes Anesthesia problems in his brother; Diabetes in his brother; Heart Problems in his brother, brother, and sister; Heart attack in his father; Heart disease in his mother; Heart failure in his mother; Hypertension in his brother; Kidney failure in his brother; Lung cancer in his mother.     ROS:   Please see the history of present illness.    He is having his usual requirement for nitroglycerin. Claudication has resolved.  All other systems reviewed and are negative.   PHYSICAL EXAM:   VS:  BP 136/62   Pulse 61   Ht '5\' 6"'$  (1.676 m)   Wt 175 lb 9.6 oz (79.7 kg)   BMI 28.34 kg/m    GEN: Well nourished, well developed, in no acute distress  HEENT: normal  Neck: no JVD, carotid bruits, or masses Cardiac: RRR; no murmurs, rubs, or gallops,no edema  Respiratory:  clear to auscultation bilaterally, normal work of breathing GI:  soft, nontender, nondistended, + BS MS: no deformity or atrophy  Skin: warm and dry, no rash Neuro:  Alert and Oriented x 3, Strength and sensation are intact Psych: euthymic mood, full affect  Wt Readings from Last 3 Encounters:  09/11/15 175 lb 9.6 oz (79.7 kg)  07/15/15 175 lb (79.4 kg)  06/16/15 175 lb (79.4 kg)      Studies/Labs Reviewed:   EKG:  EKG  No new data  Recent Labs: 07/01/2015: Platelets 191 07/15/2015: BUN 11; Creatinine, Ser 1.00; Hemoglobin 14.3; Potassium 4.7; Sodium 132   Lipid Panel    Component Value Date/Time   CHOL 160 08/28/2014 0610   TRIG 108 08/28/2014 0610   HDL 39 (L) 08/28/2014 0610   CHOLHDL 4.1 08/28/2014 0610   VLDL 22 08/28/2014 0610   LDLCALC 99 08/28/2014 0610    Additional studies/ records that were reviewed today include:  Lower extremity bilateral iliac intervention July 2017: Conclusion   1. Significant bilateral common iliac artery stenosis. 2. Severe right popliteal artery stenosis behind the knee with three-vessel runoff below the knee. 3. Successful bilateral common iliac artery kissing stent placement.        ASSESSMENT:    1. Coronary artery disease involving native coronary artery with other forms of angina pectoris ([place])   2. Chronic combined systolic and diastolic CHF (congestive heart failure) ([place])   3. Essential hypertension   4. Hyperlipidemia   5. PVD  (peripheral vascular disease) ([place])      PLAN:  In order of problems listed above:  1. Angina is stable. Continue long-acting nitrates and Ranexa. Someone will nitroglycerin use as needed. Also encouraged increased aerobic activity now that claudication has been markedly improved by lower extremity intervention. 2. No evidence of volume overload. Denies dyspnea. Continue current regimen including beta blocker therapy. 3. We discussed blood pressure targets of 130/90 mmHg or less. Low salt diet reemphasized. 4. We also discussed lipid management and our goal for LDL cholesterol of 70 or less. This is followed by his primary care. 5. There has been dramatic improvement in his exertional tolerance since having bilateral lower extremity iliac stents. He is now walking greater than a mile a day. This is good for both his lower extremities and his heart.    Medication Adjustments/Labs and Tests Ordered: Current medicines are reviewed at length with the patient today.  Concerns regarding medicines are outlined above.  Medication changes, Labs and Tests ordered today are listed in the Patient Instructions below. Patient Instructions  Your physician recommends that you continue on your current medications as directed. Please refer to the Current Medication list given to you today. Your physician wants you to follow-up in: 8-9 [place] Tamala Julian.  You will receive a reminder letter in the mail two months in advance. If you don't receive a letter, please call our office to schedule the follow-up appointment. PLEASE CALL THE OFFICE FOR ANY CHEST PAIN OR SHORTNESS OF BREATH    Signed, Sinclair Grooms, MD  09/11/2015 12:43 PM    [facility] [address], [place], [place]  [address] Phone: [phone]; Fax: [phone]

## 2015-09-11 NOTE — Patient Instructions (Signed)
Your physician recommends that you continue on your current medications as directed. Please refer to the Current Medication list given to you today. Your physician wants you to follow-up in: 8-9 [name] [name].  You will receive a reminder letter in the mail two months in advance. If you don't receive a letter, please call our office to schedule the follow-up appointment. PLEASE CALL THE OFFICE FOR ANY CHEST PAIN OR SHORTNESS OF BREATH

## 2015-10-17 ENCOUNTER — Other Ambulatory Visit: Payer: Self-pay | Admitting: Interventional Cardiology

## 2015-11-02 ENCOUNTER — Telehealth: Payer: Self-pay | Admitting: Interventional Cardiology

## 2015-11-02 DIAGNOSIS — R0600 Dyspnea, unspecified: Secondary | ICD-10-CM

## 2015-11-02 NOTE — Telephone Encounter (Signed)
Spoke with pt and he states that he was to call if he had SOB.  Pt has been having intermittent SOB x 3 days along with dizziness and lightheadedness.  Had pt check vitals while on the phone: 182/81, 67.  Pt states vitals yesterday were 149/68, 52.  Denies swelling, blurred vision or CP.  Pt states this SOB is very similar to his previous issues with SOB but seems to be lasting longer than usual.  Pt still able to do daily activities and exercise.  Actually feels better when exercising but worsens with rest or when laying down.  Advised I will send message to Dr. Tamala Julian for review and advisement.

## 2015-11-02 NOTE — Telephone Encounter (Signed)
New Message:   Pt is short of breath and dizzy.

## 2015-11-03 NOTE — Telephone Encounter (Signed)
He needs CBC, BMET, and BNP for dyspnea workup. Start spironolactone 25 mg daily. Recheck BMET 10 days.

## 2015-11-04 MED ORDER — SPIRONOLACTONE 25 MG PO TABS: 25.0000 mg | ORAL_TABLET | Freq: Every day | ORAL | 3 refills | Status: DC

## 2015-11-04 NOTE — Telephone Encounter (Signed)
Spoke with pt and advised him of new orders per Dr. Tamala Julian.  Pt verbalized understanding and was in agreement with this plan.  Scheduled labs for 11/2 and 11/10.

## 2015-11-05 ENCOUNTER — Other Ambulatory Visit: Payer: Medicare Other | Admitting: *Deleted

## 2015-11-05 DIAGNOSIS — R0600 Dyspnea, unspecified: Secondary | ICD-10-CM

## 2015-11-05 LAB — CBC
HCT: 41.7 % (ref 38.5–50.0)
Hemoglobin: 13.8 g/dL (ref 13.2–17.1)
MCH: 31.1 pg (ref 27.0–33.0)
MCHC: 33.1 g/dL (ref 32.0–36.0)
MCV: 93.9 fL (ref 80.0–100.0)
MPV: 11.5 fL (ref 7.5–12.5)
PLATELETS: 192 10*3/uL (ref 140–400)
RBC: 4.44 MIL/uL (ref 4.20–5.80)
RDW: 13.3 % (ref 11.0–15.0)
WBC: 9.3 10*3/uL (ref 3.8–10.8)

## 2015-11-06 LAB — BASIC METABOLIC PANEL
BUN: 11 mg/dL (ref 7–25)
CALCIUM: 9.2 mg/dL (ref 8.6–10.3)
CO2: 17 mmol/L — ABNORMAL LOW (ref 20–31)
Chloride: 99 mmol/L (ref 98–110)
Creat: 1 mg/dL (ref 0.70–1.25)
Glucose, Bld: 175 mg/dL — ABNORMAL HIGH (ref 65–99)
POTASSIUM: 4.5 mmol/L (ref 3.5–5.3)
SODIUM: 129 mmol/L — AB (ref 135–146)

## 2015-11-06 LAB — BRAIN NATRIURETIC PEPTIDE: Brain Natriuretic Peptide: 122.8 pg/mL — ABNORMAL HIGH (ref <100)

## 2015-11-13 ENCOUNTER — Other Ambulatory Visit: Payer: Medicare Other | Admitting: *Deleted

## 2015-11-13 DIAGNOSIS — R0600 Dyspnea, unspecified: Secondary | ICD-10-CM

## 2015-11-13 LAB — BASIC METABOLIC PANEL
BUN: 9 mg/dL (ref 7–25)
CHLORIDE: 96 mmol/L — AB (ref 98–110)
CO2: 21 mmol/L (ref 20–31)
CREATININE: 0.88 mg/dL (ref 0.70–1.25)
Calcium: 9.1 mg/dL (ref 8.6–10.3)
Glucose, Bld: 213 mg/dL — ABNORMAL HIGH (ref 65–99)
POTASSIUM: 4.9 mmol/L (ref 3.5–5.3)
SODIUM: 127 mmol/L — AB (ref 135–146)

## 2016-02-19 ENCOUNTER — Encounter: Payer: Self-pay | Admitting: *Deleted

## 2016-02-23 ENCOUNTER — Encounter: Payer: Self-pay | Admitting: Cardiovascular Disease

## 2016-02-23 ENCOUNTER — Ambulatory Visit (INDEPENDENT_AMBULATORY_CARE_PROVIDER_SITE_OTHER): Payer: Medicare Other | Admitting: Cardiovascular Disease

## 2016-02-23 VITALS — BP 150/60 | HR 58 | Ht 66.5 in | Wt 179.0 lb

## 2016-02-23 DIAGNOSIS — R0989 Other specified symptoms and signs involving the circulatory and respiratory systems: Secondary | ICD-10-CM

## 2016-02-23 DIAGNOSIS — I739 Peripheral vascular disease, unspecified: Secondary | ICD-10-CM | POA: Diagnosis not present

## 2016-02-23 DIAGNOSIS — I10 Essential (primary) hypertension: Secondary | ICD-10-CM | POA: Diagnosis not present

## 2016-02-23 DIAGNOSIS — I25118 Atherosclerotic heart disease of native coronary artery with other forms of angina pectoris: Secondary | ICD-10-CM | POA: Diagnosis not present

## 2016-02-23 NOTE — Progress Notes (Signed)
Cardiology Office Note   Date:  02/23/2016   ID:  [name] [name], DOB [birth year], MRN [id number]  PCP:  Irven Shelling, MD  Cardiologist: Dr. Tamala Julian  Chief Complaint  Patient presents with  . PAD    follow up      History of Present Illness: [name] [name] is a 66 y.o. male who presents for a follow-up visit regarding peripheral arterial disease. He has known history of coronary artery disease status post CABG, chronic diastolic heart failure, hypertension, type 2 diabetes and hyperlipidemia.  He was seen last year for severe bilateral leg claudication with no improvement with a walking program. Angiography was done in July which showed significant bilateral common iliac artery stenosis with severe right popliteal artery stenosis and three-vessel runoff below the knee. I performed successful bilateral common iliac artery kissing stent placement without complications. His claudication almost him clinically resolved after that. He reports occasional right calf pain but that is not very frequent. He has been able to walk for 1 mile daily. He did slow down recently due to a viral illness. He was placed recently on spironolactone but he did not tolerate the medication due to GI symptoms and rash. He reports that his blood pressure is controlled at home with systolic blood pressure less than 130 most of the time.     Past Medical History:  Diagnosis Date  . Chest pain    a. experience weekly, nitrate responsive c/p x several years.   b. ranexa added on 08/2014 admission   . Chronic combined systolic and diastolic CHF (congestive heart failure) ([place])   . Claudication ([place])    a. 08/2014 ABI: R 0.79, L 1.0-->Walking program instituted.  . Coronary artery disease    a. s/p MI in 1989;  b. 04/2008 CABG x 5 (LIMA->LAD, VG->D1, VG->RI, VG->PDA, L Radial->OM2);  c. 06/2010 PCI/DES to the Diag (2.25x28 Promus Element DES). VG->Diag occluded;  d. 08/2010 Attempted PTCA of the OM2 via the free radial  graft. Unsuccessful.  Lesion felt to be 2/2 suture.  c. NSTEMI s/p [facility] with severe native and graph dz; D1 that was stented in 2012 now occulded: Rx therapy recommended  . Hyperlipidemia   . Hypertension   . Type II diabetes mellitus ([place])     Past Surgical History:  Procedure Laterality Date  . CARDIAC CATHETERIZATION N/A 08/28/2014   Procedure: Left Heart Cath and Cors/Grafts Angiography;  Surgeon: Belva Crome, MD;  Location: [facility];  Service: Cardiovascular;  Laterality: N/A;  . COLONOSCOPY    . CORONARY ANGIOPLASTY WITH STENT PLACEMENT    . CORONARY ARTERY BYPASS GRAFT    . PERIPHERAL VASCULAR CATHETERIZATION N/A 07/15/2015   Procedure: Abdominal Aortogram w/Lower Extremity;  Surgeon: Wellington Hampshire, MD;  Location: [facility];  Service: Cardiovascular;  Laterality: N/A;  . PERIPHERAL VASCULAR CATHETERIZATION Bilateral 07/15/2015   Procedure: Peripheral Vascular Intervention;  Surgeon: Wellington Hampshire, MD;  Location: [facility];  Service: Cardiovascular;  Laterality: Bilateral;  Common Iliacs  . SHOULDER ARTHROSCOPY WITH ROTATOR CUFF REPAIR AND SUBACROMIAL DECOMPRESSION Left 05/23/2013   Procedure: LEFT SHOULDER ARTHROSCOPY SUBACROMIAL  DECOMPRESSION DISTAL CLAVICLE RESECTION AND ROTATOR CUFF REPAIR ;  Surgeon: Marin Shutter, MD;  Location: [facility];  Service: Orthopedics;  Laterality: Left;  . SHOULDER SURGERY Right 7-8 years ago   Dr. Dorna Leitz     Current Outpatient Prescriptions  Medication Sig Dispense Refill  . acetaminophen (TYLENOL) 500 MG tablet Take 500  mg by mouth daily as needed for mild pain.    Marland Kitchen ALPRAZolam (XANAX) 0.25 MG tablet Take 0.25 mg by mouth 3 (three) times daily as needed for anxiety.     . Aromatic Inhalants (VICKS VAPOR INHALER IN) Inhale into the lungs daily as needed (for wheezing).     Marland Kitchen aspirin 81 MG tablet Take 81 mg by mouth daily.     Marland Kitchen atorvastatin (LIPITOR) 40 MG tablet Take 80 mg by mouth at bedtime.     Marland Kitchen CARTIA XT  180 MG 24 hr capsule TAKE TWO CAPSULES BY MOUTH ONCE DAILY AT BEDTIME 180 capsule 0  . carvedilol (COREG) 6.25 MG tablet Take 1 tablet by mouth two  times daily 180 tablet 3  . cholecalciferol (VITAMIN D) 1000 UNITS tablet Take 1,000 Units by mouth daily.     . clopidogrel (PLAVIX) 75 MG tablet Take 75 mg by mouth daily with breakfast.    . desoximetasone (TOPICORT) 0.25 % cream Apply 1 application topically once a week.     . ezetimibe (ZETIA) 10 MG tablet Take 1 tablet by mouth  daily 90 tablet 3  . fluticasone (FLONASE) 50 MCG/ACT nasal spray Place 2 sprays into the nose daily. For nasal congestion    . isosorbide mononitrate (IMDUR) 120 MG 24 hr tablet Take 120 mg by mouth daily. Take with Imdur 30 mg daily to equal 150 mg total daily.    . isosorbide mononitrate (IMDUR) 30 MG 24 hr tablet Take 30 mg by mouth daily. Take with Imdur 120 mg daily to equal 150 mg total daily.    . metFORMIN (GLUCOPHAGE) 1000 MG tablet Take 1,000 mg by mouth 2 (two) times daily with a meal.    . nitroGLYCERIN (NITROSTAT) 0.4 MG SL tablet Place 0.4 mg under the tongue every 5 (five) minutes x 3 doses as needed. For chest pain    . Polyvinyl Alcohol-Povidone (CLEAR EYES ALL SEASONS OP) Place 1 drop into both eyes daily as needed (for dry eyes).    . quinapril (ACCUPRIL) 40 MG tablet Take one (1) tablet (40 mg total) by mouth each morning and take half (1/2) tablet (20 mg total) by mouth each evening.    Marland Kitchen RANEXA 500 MG 12 hr tablet TAKE 1 TABLET BY MOUTH TWO  TIMES DAILY 180 tablet 3  . sitaGLIPtin (JANUVIA) 100 MG tablet Take 100 mg by mouth daily.     Marland Kitchen spironolactone (ALDACTONE) 25 MG tablet Take 1 tablet (25 mg total) by mouth daily. 90 tablet 3   No current facility-administered medications for this visit.     Allergies:   Avandia [rosiglitazone] and Codeine    Social History:  The patient  reports that he quit smoking about 29 years ago. He has quit using smokeless tobacco. He reports that he drinks about  1.2 oz of alcohol per week . He reports that he does not use drugs.   Family History:  The patient's family history includes Anesthesia problems in his brother; Diabetes in his brother; Heart Problems in his brother, brother, and sister; Heart attack in his father; Heart disease in his mother; Heart failure in his mother; Hypertension in his brother; Kidney failure in his brother; Lung cancer in his mother.    ROS:  Please see the history of present illness.   Otherwise, review of systems are positive for none.   All other systems are reviewed and negative.    PHYSICAL EXAM: VS:  BP (!) 150/60   Pulse Marland Kitchen)  58   Ht 5' 6.5" (1.689 m)   Wt 179 lb (81.2 kg)   BMI 28.46 kg/m  , BMI Body mass index is 28.46 kg/m. GEN: Well nourished, well developed, in no acute distress  HEENT: normal  Neck: no JVD,  or masses. Faint right carotid bruit Cardiac: RRR; no murmurs, rubs, or gallops,no edema  Respiratory:  clear to auscultation bilaterally, normal work of breathing GI: soft, nontender, nondistended, + BS MS: no deformity or atrophy  Skin: warm and dry, no rash Neuro:  Strength and sensation are intact Psych: euthymic mood, full affect Vascular: Femoral pulses are normal bilaterally. Distal pulses are normal in the left and not palpable on the right  EKG:  EKG is ordered today. EKG showed sinus bradycardia with first-degree AV block. ST and T wave changes suggestive of lateral ischemia.   Recent Labs: 11/05/2015: Brain Natriuretic Peptide 122.8; Hemoglobin 13.8; Platelets 192 11/13/2015: BUN 9; Creat 0.88; Potassium 4.9; Sodium 127    Lipid Panel    Component Value Date/Time   CHOL 160 08/28/2014 0610   TRIG 108 08/28/2014 0610   HDL 39 (L) 08/28/2014 0610   CHOLHDL 4.1 08/28/2014 0610   VLDL 22 08/28/2014 0610   LDLCALC 99 08/28/2014 0610      Wt Readings from Last 3 Encounters:  02/23/16 179 lb (81.2 kg)  09/11/15 175 lb 9.6 oz (79.7 kg)  07/15/15 175 lb (79.4 kg)         ASSESSMENT AND PLAN:  1.  Peripheral arterial disease : Status post bilateral common iliac artery stent placement . He continues to have severe right popliteal artery stenosis but he has minimal claudication at the present time. I recommend continuing medical therapy. He is due for repeat aortoiliac duplex .  2. Coronary artery disease with other forms of angina: Seems to be stable.  3. Essential hypertension: Blood pressure iselevated today. He did not tolerate spironolactone due to GI symptoms and a rash. His blood pressure is controlled at home. A thiazide diuretic can be considered.   4. Right carotid bruit: I requested carotid Doppler.   Disposition:   FU with me in 1 year.   Signed,  Kathlyn [place], MD  02/23/2016 9:55 AM    [facility]

## 2016-02-23 NOTE — Patient Instructions (Signed)
Medication Instructions:  Your physician has recommended you make the following change in your medication:  1. STOP Spironolactone (Aldactone)  Labwork: No new orders.   Testing/Procedures: Your physician has requested that you have a carotid duplex (3/6 is possible). This test is an ultrasound of the carotid arteries in your neck. It looks at blood flow through these arteries that supply the brain with blood. Allow one hour for this exam. There are no restrictions or special instructions.  Follow-Up: Your physician wants you to follow-up in: 1 YEAR with Dr Fletcher Anon.  You will receive a reminder letter in the mail two months in advance. If you don't receive a letter, please call our office to schedule the follow-up appointment.   Any Other Special Instructions Will Be Listed Below (If Applicable).     If you need a refill on your cardiac medications before your next appointment, please call your pharmacy.

## 2016-03-07 ENCOUNTER — Other Ambulatory Visit: Payer: Self-pay | Admitting: Cardiovascular Disease

## 2016-03-07 DIAGNOSIS — I739 Peripheral vascular disease, unspecified: Secondary | ICD-10-CM

## 2016-03-07 DIAGNOSIS — R0989 Other specified symptoms and signs involving the circulatory and respiratory systems: Secondary | ICD-10-CM

## 2016-03-08 ENCOUNTER — Ambulatory Visit (HOSPITAL_COMMUNITY)
Admission: RE | Admit: 2016-03-08 | Discharge: 2016-03-08 | Disposition: A | Discharge status: Home / Self Care | Payer: Medicare Other | Source: Ambulatory Visit | Attending: Cardiovascular Disease | Admitting: Cardiovascular Disease

## 2016-03-08 DIAGNOSIS — Z87891 Personal history of nicotine dependence: Secondary | ICD-10-CM | POA: Diagnosis not present

## 2016-03-08 DIAGNOSIS — I10 Essential (primary) hypertension: Secondary | ICD-10-CM | POA: Insufficient documentation

## 2016-03-08 DIAGNOSIS — E119 Type 2 diabetes mellitus without complications: Secondary | ICD-10-CM | POA: Diagnosis not present

## 2016-03-08 DIAGNOSIS — I739 Peripheral vascular disease, unspecified: Secondary | ICD-10-CM | POA: Diagnosis not present

## 2016-03-08 DIAGNOSIS — R0989 Other specified symptoms and signs involving the circulatory and respiratory systems: Secondary | ICD-10-CM

## 2016-03-08 DIAGNOSIS — I708 Atherosclerosis of other arteries: Secondary | ICD-10-CM | POA: Insufficient documentation

## 2016-03-08 DIAGNOSIS — E785 Hyperlipidemia, unspecified: Secondary | ICD-10-CM | POA: Insufficient documentation

## 2016-03-08 DIAGNOSIS — I2510 Atherosclerotic heart disease of native coronary artery without angina pectoris: Secondary | ICD-10-CM | POA: Diagnosis not present

## 2016-03-08 DIAGNOSIS — Z95820 Peripheral vascular angioplasty status with implants and grafts: Secondary | ICD-10-CM | POA: Insufficient documentation

## 2016-03-08 DIAGNOSIS — I6523 Occlusion and stenosis of bilateral carotid arteries: Secondary | ICD-10-CM | POA: Diagnosis not present

## 2016-03-14 ENCOUNTER — Other Ambulatory Visit: Payer: Self-pay

## 2016-03-14 DIAGNOSIS — I739 Peripheral vascular disease, unspecified: Principal | ICD-10-CM

## 2016-03-14 DIAGNOSIS — I779 Disorder of arteries and arterioles, unspecified: Secondary | ICD-10-CM

## 2016-03-15 ENCOUNTER — Other Ambulatory Visit: Payer: Self-pay | Admitting: Physician Assistant

## 2016-03-15 DIAGNOSIS — E785 Hyperlipidemia, unspecified: Secondary | ICD-10-CM

## 2016-04-05 ENCOUNTER — Other Ambulatory Visit: Payer: Self-pay | Admitting: Cardiovascular Disease

## 2016-05-17 ENCOUNTER — Other Ambulatory Visit: Payer: Self-pay | Admitting: Physician Assistant

## 2016-05-17 DIAGNOSIS — I10 Essential (primary) hypertension: Secondary | ICD-10-CM

## 2016-06-29 NOTE — Progress Notes (Signed)
Cardiology Office Note    Date:  06/30/2016   ID:  Rasheed Welty, DOB 01-08-1950, MRN [id number]  PCP:  [name], MD  Cardiologist: Sinclair Grooms, MD   Chief Complaint  Patient presents with  . Coronary Artery Disease  . Atrial Fibrillation    History of Present Illness:  [name] [name] is a 66 y.o. male follow-up CAD, prior history of CABG and percutaneous coronary intervention (most recently 2012), chronic combined systolic and diastolic heart failure (EF at cath 2016 was 40-50%), hypertension, hyperlipidemia, and type 2 diabetes.   Accompanied by his wife. No complaints. Stable class 2/3 anginal complaints. Exertion-related and relieved by nitroglycerin when needed. No change in pattern over the last 12 months. Symptoms are more profound/noticeable in cold weather.  Blood pressure is not well controlled but we have not been able use diuretic therapy due to chronic hyponatremia that worsens with diuresis.  Past Medical History:  Diagnosis Date  . Chest pain    a. experience weekly, nitrate responsive c/p x several years.   b. ranexa added on 08/2014 admission   . Chronic combined systolic and diastolic CHF (congestive heart failure) ([place])   . Claudication ([place])    a. 08/2014 ABI: R 0.79, L 1.0-->Walking program instituted.  . Coronary artery disease    a. s/p MI in 1989;  b. 04/2008 CABG x 5 (LIMA->LAD, VG->D1, VG->RI, VG->PDA, L Radial->OM2);  c. 06/2010 PCI/DES to the Diag (2.25x28 Promus Element DES). VG->Diag occluded;  d. 08/2010 Attempted PTCA of the OM2 via the free radial graft. Unsuccessful.  Lesion felt to be 2/2 suture.  c. NSTEMI s/p [facility] with severe native and graph dz; D1 that was stented in 2012 now occulded: Rx therapy recommended  . Hyperlipidemia   . Hypertension   . Type II diabetes mellitus ([place])     Past Surgical History:  Procedure Laterality Date  . CARDIAC CATHETERIZATION N/A 08/28/2014   Procedure: Left Heart Cath and Cors/Grafts Angiography;   Surgeon: Belva Crome, MD;  Location: [facility];  Service: Cardiovascular;  Laterality: N/A;  . COLONOSCOPY    . CORONARY ANGIOPLASTY WITH STENT PLACEMENT    . CORONARY ARTERY BYPASS GRAFT    . PERIPHERAL VASCULAR CATHETERIZATION N/A 07/15/2015   Procedure: Abdominal Aortogram w/Lower Extremity;  Surgeon: Wellington Hampshire, MD;  Location: [facility];  Service: Cardiovascular;  Laterality: N/A;  . PERIPHERAL VASCULAR CATHETERIZATION Bilateral 07/15/2015   Procedure: Peripheral Vascular Intervention;  Surgeon: Wellington Hampshire, MD;  Location: [name];  Service: Cardiovascular;  Laterality: Bilateral;  Common Iliacs  . SHOULDER ARTHROSCOPY WITH ROTATOR CUFF REPAIR AND SUBACROMIAL DECOMPRESSION Left 05/23/2013   Procedure: LEFT SHOULDER ARTHROSCOPY SUBACROMIAL  DECOMPRESSION DISTAL CLAVICLE RESECTION AND ROTATOR CUFF REPAIR ;  Surgeon: Marin Shutter, MD;  Location: [facility];  Service: Orthopedics;  Laterality: Left;  . SHOULDER SURGERY Right 7-8 years ago   Dr. Dorna Leitz    Current Medications: Outpatient Medications Prior to Visit  Medication Sig Dispense Refill  . acetaminophen (TYLENOL) 500 MG tablet Take 500 mg by mouth daily as needed for mild pain.    Marland Kitchen ALPRAZolam (XANAX) 0.25 MG tablet Take 0.25 mg by mouth 3 (three) times daily as needed for anxiety.     . Aromatic Inhalants (VICKS VAPOR INHALER IN) Inhale into the lungs daily as needed (for wheezing).     Marland Kitchen aspirin 81 MG tablet Take 81 mg by mouth daily.     Marland Kitchen  atorvastatin (LIPITOR) 80 MG tablet Take 1 tablet (80 mg total) by mouth daily at 6 PM. 90 tablet 3  . CARTIA XT 180 MG 24 hr capsule TAKE TWO CAPSULES BY MOUTH ONCE DAILY AT BEDTIME 180 capsule 0  . carvedilol (COREG) 6.25 MG tablet TAKE 1 TABLET BY MOUTH TWO  TIMES DAILY 180 tablet 3  . cholecalciferol (VITAMIN D) 1000 UNITS tablet Take 1,000 Units by mouth daily.     . clopidogrel (PLAVIX) 75 MG tablet Take 75 mg by mouth daily with breakfast.    .  desoximetasone (TOPICORT) 0.25 % cream Apply 1 application topically once a week.     . ezetimibe (ZETIA) 10 MG tablet TAKE 1 TABLET BY MOUTH  DAILY 90 tablet 3  . fluticasone (FLONASE) 50 MCG/ACT nasal spray Place 2 sprays into the nose daily. For nasal congestion    . isosorbide mononitrate (IMDUR) 120 MG 24 hr tablet Take 120 mg by mouth daily. Take with Imdur 30 mg daily to equal 150 mg total daily.    . isosorbide mononitrate (IMDUR) 30 MG 24 hr tablet TAKE ONE TABLET BY MOUTH ONCE DAILY 90 tablet 3  . metFORMIN (GLUCOPHAGE) 1000 MG tablet Take 1,000 mg by mouth 2 (two) times daily with a meal.    . nitroGLYCERIN (NITROSTAT) 0.4 MG SL tablet Place 0.4 mg under the tongue every 5 (five) minutes x 3 doses as needed. For chest pain    . Polyvinyl Alcohol-Povidone (CLEAR EYES ALL SEASONS OP) Place 1 drop into both eyes daily as needed (for dry eyes).    . RANEXA 500 MG 12 hr tablet TAKE 1 TABLET BY MOUTH TWO  TIMES DAILY 180 tablet 3  . sitaGLIPtin (JANUVIA) 100 MG tablet Take 100 mg by mouth daily.     Marland Kitchen atorvastatin (LIPITOR) 40 MG tablet Take 80 mg by mouth at bedtime.     . quinapril (ACCUPRIL) 40 MG tablet TAKE 1 TABLET BY MOUTH AT  BEDTIME (Patient not taking: Reported on 06/30/2016) 90 tablet 3   No facility-administered medications prior to visit.      Allergies:   Avandia [rosiglitazone] and Codeine   Social History   Social History  . Marital status: Married    Spouse name: N/A  . Number of children: N/A  . Years of education: N/A   Social History Main Topics  . Smoking status: Former Smoker    Quit date: 01/04/1987  . Smokeless tobacco: Former Systems developer  . Alcohol use 1.2 oz/week    2 Cans of beer per week     Comment: a week  . Drug use: No  . Sexual activity: Not Asked   Other Topics Concern  . None   Social History Narrative   Lives in [place] with his wife.  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     Family History:  The patient's family history includes Anesthesia  problems in his brother; Diabetes in his brother; Heart Problems in his brother, brother, and sister; Heart attack in his father; Heart disease in his mother; Heart failure in his mother; Hypertension in his brother; Kidney failure in his brother; Lung cancer in his mother.   ROS:   Please see the history of present illness.    Recent Doppler study demonstrated less than 40% bilateral carotid obstruction. Has difficulty with lower extremity swelling. Occasional low back discomfort. Occasional bilateral leg discomfort.  All other systems reviewed and are negative.   PHYSICAL EXAM:   VS:  BP Marland Kitchen)  166/80 (BP Location: Left Arm)   Pulse 60   Ht 5' 6.5" (1.689 m)   Wt 176 lb 9.6 oz (80.1 kg)   BMI 28.08 kg/m    GEN: Well nourished, well developed, in no acute distress  HEENT: normal  Neck: no JVD, carotid bruits, or masses Cardiac: RRR;  rubs, or gallops,no edema . 1/6 systolic murmur right upper sternal border. Respiratory:  clear to auscultation bilaterally, normal work of breathing GI: soft, nontender, nondistended, + BS MS: no deformity or atrophy  Skin: warm and dry, no rash Neuro:  Alert and Oriented x 3, Strength and sensation are intact Psych: euthymic mood, full affect  Wt Readings from Last 3 Encounters:  06/30/16 176 lb 9.6 oz (80.1 kg)  02/23/16 179 lb (81.2 kg)  09/11/15 175 lb 9.6 oz (79.7 kg)      Studies/Labs Reviewed:   EKG:  EKG  None performed today.  Recent Labs: 11/05/2015: Brain Natriuretic Peptide 122.8; Hemoglobin 13.8; Platelets 192 11/13/2015: BUN 9; Creat 0.88; Potassium 4.9; Sodium 127   Lipid Panel    Component Value Date/Time   CHOL 160 08/28/2014 0610   TRIG 108 08/28/2014 0610   HDL 39 (L) 08/28/2014 0610   CHOLHDL 4.1 08/28/2014 0610   VLDL 22 08/28/2014 0610   LDLCALC 99 08/28/2014 0610    Additional studies/ records that were reviewed today include:   Recent bilateral carotid Doppler study, March 2018: Heterogeneous plaque,  bilaterally. 1-39% RICA stenosis, upper end of range. 0-24% LICA stenosis. Normal subclavian arteries, bilaterally. Patent vertebral arteries with antegrade flow.   ASSESSMENT:    1. Coronary artery disease involving native coronary artery of native heart with angina pectoris ([place])   2. Essential hypertension   3. Other hyperlipidemia   4. Peripheral arterial disease ([place])   5. Chronic combined systolic and diastolic CHF (congestive heart failure) ([facility])      PLAN:  In order of problems listed above:  1. Stable angina pectoris, functional class III. Continue use nitroglycerin as required for resolution of discomfort. Care in cold/cool environments. 2. Blood pressures not as well controlled as we would like but being unable to use diuretic therapy has hampered are ability to control. He is using salt liberally in his diet. I've asked him to cut back. Otherwise no change. 3. LDL less than 70 is our goal. Most recent LDL was 41 in August 2017. He'll be performed again soon by primary care. 4. He had kissing iliac stent implantations per Dr.Arida with dramatic improvement in claudication. He is now able to exercise more which will benefit his coronary circulation. 5. No evidence of volume overload. If we try another diuretic, consider Eplerinone.  Non-twelve-month clinical follow-up. Follow-up sooner if increasing angina or evidence of heart failure demonstrated by shortness of breath and or fluid retention.  Medication Adjustments/Labs and Tests Ordered: Current medicines are reviewed at length with the patient today.  Concerns regarding medicines are outlined above.  Medication changes, Labs and Tests ordered today are listed in the Patient Instructions below. Patient Instructions  Medication Instructions:  None  Labwork: None  Testing/Procedures: None  Follow-Up: Your physician wants you to follow-up in: 9-12 months with Dr. Tamala Julian.  You will receive a reminder letter in the  mail two months in advance. If you don't receive a letter, please call our office to schedule the follow-up appointment.   Any Other Special Instructions Will Be Listed Below (If Applicable).     If you need a refill on  your cardiac medications before your next appointment, please call your pharmacy.      Signed, Sinclair Grooms, MD  06/30/2016 12:19 PM    [facility] [address], [place], [place]  [address] Phone: [phone]; Fax: [phone]

## 2016-06-30 ENCOUNTER — Ambulatory Visit (INDEPENDENT_AMBULATORY_CARE_PROVIDER_SITE_OTHER): Payer: Medicare Other | Admitting: Interventional Cardiology

## 2016-06-30 ENCOUNTER — Encounter: Payer: Self-pay | Admitting: Interventional Cardiology

## 2016-06-30 VITALS — BP 166/80 | HR 60 | Ht 66.5 in | Wt 176.6 lb

## 2016-06-30 DIAGNOSIS — E784 Other hyperlipidemia: Secondary | ICD-10-CM

## 2016-06-30 DIAGNOSIS — I5042 Chronic combined systolic (congestive) and diastolic (congestive) heart failure: Secondary | ICD-10-CM

## 2016-06-30 DIAGNOSIS — I10 Essential (primary) hypertension: Secondary | ICD-10-CM

## 2016-06-30 DIAGNOSIS — I739 Peripheral vascular disease, unspecified: Secondary | ICD-10-CM

## 2016-06-30 DIAGNOSIS — I25119 Atherosclerotic heart disease of native coronary artery with unspecified angina pectoris: Secondary | ICD-10-CM | POA: Diagnosis not present

## 2016-06-30 DIAGNOSIS — E7849 Other hyperlipidemia: Secondary | ICD-10-CM

## 2016-06-30 NOTE — Patient Instructions (Signed)
Medication Instructions:  None  Labwork: None  Testing/Procedures: None  Follow-Up: Your physician wants you to follow-up in: 9-12 months with Dr. Tamala Julian.  You will receive a reminder letter in the mail two months in advance. If you don't receive a letter, please call our office to schedule the follow-up appointment.   Any Other Special Instructions Will Be Listed Below (If Applicable).     If you need a refill on your cardiac medications before your next appointment, please call your pharmacy.

## 2016-07-15 ENCOUNTER — Other Ambulatory Visit: Payer: Self-pay | Admitting: Interventional Cardiology

## 2016-07-15 DIAGNOSIS — I10 Essential (primary) hypertension: Secondary | ICD-10-CM

## 2016-09-17 ENCOUNTER — Other Ambulatory Visit: Payer: Self-pay | Admitting: Interventional Cardiology

## 2016-10-25 ENCOUNTER — Other Ambulatory Visit: Payer: Self-pay | Admitting: *Deleted

## 2016-10-25 DIAGNOSIS — I739 Peripheral vascular disease, unspecified: Secondary | ICD-10-CM

## 2017-01-20 ENCOUNTER — Other Ambulatory Visit: Payer: Self-pay | Admitting: *Deleted

## 2017-01-20 DIAGNOSIS — E785 Hyperlipidemia, unspecified: Secondary | ICD-10-CM

## 2017-01-20 MED ORDER — ATORVASTATIN CALCIUM 80 MG PO TABS: 80.0000 mg | ORAL_TABLET | Freq: Every day | ORAL | 2 refills | Status: DC

## 2017-02-21 NOTE — Progress Notes (Signed)
Cardiology Office Note    Date:  02/22/2017   ID:  Rontae Inglett, DOB 23-Oct-1950, MRN [id number]  PCP:  [name], MD  Cardiologist: Sinclair Grooms, MD   Chief Complaint  Patient presents with  . Coronary Artery Disease    History of Present Illness:  Alante Tolan is a 67 y.o. male  follow-up CAD, prior history of CABG and percutaneous coronary intervention (most recently 2012), chronic combined systolic and diastolic heart failure (EF at cath 2016 was 40-50%), hypertension, hyperlipidemia, and type 2 diabetes.   Ollen Gross is having more angina this winter than before.  A recurring metric is been able to walk down to his barn and back up to his house which is 40 yards each way.  On most days he gets angina going back up he will.  This is different than one year ago.  If his activity is preceded by gradual walking on a flat plane, he does not get angina.  When angina occurs he stops and uses sublingual nitroglycerin with brisk relief.  He rarely if ever has this type discomfort at rest.  He does note that occasional "indigestion" awakens him from sleep.  This is different than exertional angina and that there is no discomfort in the throat or arm.  The indigestion is also associated with epigastric sense of gas.   Past Medical History:  Diagnosis Date  . Chest pain    a. experience weekly, nitrate responsive c/p x several years.   b. ranexa added on 08/2014 admission   . Chronic combined systolic and diastolic CHF (congestive heart failure) ([place])   . Claudication ([place])    a. 08/2014 ABI: R 0.79, L 1.0-->Walking program instituted.  . Coronary artery disease    a. s/p MI in 1989;  b. 04/2008 CABG x 5 (LIMA->LAD, VG->D1, VG->RI, VG->PDA, L Radial->OM2);  c. 06/2010 PCI/DES to the Diag (2.25x28 Promus Element DES). VG->Diag occluded;  d. 08/2010 Attempted PTCA of the OM2 via the free radial graft. Unsuccessful.  Lesion felt to be 2/2 suture.  c. NSTEMI s/p [facility] with severe native and graph dz;  D1 that was stented in 2012 now occulded: Rx therapy recommended  . Hyperlipidemia   . Hypertension   . Type II diabetes mellitus ([place])     Past Surgical History:  Procedure Laterality Date  . CARDIAC CATHETERIZATION N/A 08/28/2014   Procedure: Left Heart Cath and Cors/Grafts Angiography;  Surgeon: Belva Crome, MD;  Location: [facility];  Service: Cardiovascular;  Laterality: N/A;  . COLONOSCOPY    . CORONARY ANGIOPLASTY WITH STENT PLACEMENT    . CORONARY ARTERY BYPASS GRAFT    . PERIPHERAL VASCULAR CATHETERIZATION N/A 07/15/2015   Procedure: Abdominal Aortogram w/Lower Extremity;  Surgeon: Wellington Hampshire, MD;  Location: [facility];  Service: Cardiovascular;  Laterality: N/A;  . PERIPHERAL VASCULAR CATHETERIZATION Bilateral 07/15/2015   Procedure: Peripheral Vascular Intervention;  Surgeon: Wellington Hampshire, MD;  Location: [facility];  Service: Cardiovascular;  Laterality: Bilateral;  Common Iliacs  . SHOULDER ARTHROSCOPY WITH ROTATOR CUFF REPAIR AND SUBACROMIAL DECOMPRESSION Left 05/23/2013   Procedure: LEFT SHOULDER ARTHROSCOPY SUBACROMIAL  DECOMPRESSION DISTAL CLAVICLE RESECTION AND ROTATOR CUFF REPAIR ;  Surgeon: Marin Shutter, MD;  Location: [facility];  Service: Orthopedics;  Laterality: Left;  . SHOULDER SURGERY Right 7-8 years ago   Dr. Dorna Leitz    Current Medications: Outpatient Medications Prior to Visit  Medication Sig Dispense Refill  . acetaminophen (  TYLENOL) 500 MG tablet Take 500 mg by mouth daily as needed for mild pain.    Marland Kitchen ALPRAZolam (XANAX) 0.25 MG tablet Take 0.25 mg by mouth 3 (three) times daily as needed for anxiety.     Marland Kitchen amLODipine (NORVASC) 10 MG tablet Take 10 mg by mouth daily.    . Aromatic Inhalants (VICKS VAPOR INHALER IN) Inhale into the lungs daily as needed (for wheezing).     Marland Kitchen aspirin 81 MG tablet Take 81 mg by mouth daily.     Marland Kitchen atorvastatin (LIPITOR) 80 MG tablet Take 1 tablet (80 mg total) by mouth daily at 6 PM. 90 tablet  2  . carvedilol (COREG) 6.25 MG tablet TAKE 1 TABLET BY MOUTH TWO  TIMES DAILY 180 tablet 3  . cholecalciferol (VITAMIN D) 1000 UNITS tablet Take 1,000 Units by mouth daily.     . clopidogrel (PLAVIX) 75 MG tablet Take 75 mg by mouth daily with breakfast.    . desoximetasone (TOPICORT) 0.25 % cream Apply 1 application topically once a week.     . ezetimibe (ZETIA) 10 MG tablet TAKE 1 TABLET BY MOUTH  DAILY 90 tablet 3  . fluticasone (FLONASE) 50 MCG/ACT nasal spray Place 2 sprays into the nose daily. For nasal congestion    . isosorbide mononitrate (IMDUR) 120 MG 24 hr tablet Take 120 mg by mouth daily. Take with Imdur 30 mg daily to equal 150 mg total daily.    . isosorbide mononitrate (IMDUR) 30 MG 24 hr tablet TAKE ONE TABLET BY MOUTH ONCE DAILY 90 tablet 3  . metFORMIN (GLUCOPHAGE) 1000 MG tablet Take 1,000 mg by mouth 2 (two) times daily with a meal.    . nitroGLYCERIN (NITROSTAT) 0.4 MG SL tablet Place 0.4 mg under the tongue every 5 (five) minutes x 3 doses as needed. For chest pain    . Polyvinyl Alcohol-Povidone (CLEAR EYES ALL SEASONS OP) Place 1 drop into both eyes daily as needed (for dry eyes).    . quinapril (ACCUPRIL) 20 MG tablet Take 20 mg by mouth at bedtime.    . quinapril (ACCUPRIL) 40 MG tablet Take 40 mg by mouth every morning.    Marland Kitchen RANEXA 500 MG 12 hr tablet TAKE 1 TABLET BY MOUTH TWO  TIMES DAILY 180 tablet 1  . sitaGLIPtin (JANUVIA) 100 MG tablet Take 100 mg by mouth daily.     Marland Kitchen CARTIA XT 180 MG 24 hr capsule TAKE TWO CAPSULES BY MOUTH ONCE DAILY AT BEDTIME (Patient not taking: Reported on 02/22/2017) 180 capsule 0   No facility-administered medications prior to visit.      Allergies:   Avandia [rosiglitazone] and Codeine   Social History   Socioeconomic History  . Marital status: Married    Spouse name: None  . Number of children: None  . Years of education: None  . Highest education level: None  Social Needs  . Financial resource strain: None  . Food  insecurity - worry: None  . Food insecurity - inability: None  . Transportation needs - medical: None  . Transportation needs - non-medical: None  Occupational History  . None  Tobacco Use  . Smoking status: Former Smoker    Last attempt to quit: 01/04/1987    Years since quitting: 30.1  . Smokeless tobacco: Former Network engineer and Sexual Activity  . Alcohol use: Yes    Alcohol/week: 1.2 oz    Types: 2 Cans of beer per week    Comment: a week  .  Drug use: No  . Sexual activity: None  Other Topics Concern  . None  Social History Narrative   Lives in [place] with his wife.  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     Family History:  The patient's family history includes Anesthesia problems in his brother; Diabetes in his brother; Heart Problems in his brother, brother, and sister; Heart attack in his father; Heart disease in his mother; Heart failure in his mother; Hypertension in his brother; Kidney failure in his brother; Lung cancer in his mother.   ROS:   Please see the history of present illness.    Right hip discomfort.  Able to walk without leg fatigue and aching in the left leg as prior to PCI.  Had more angina after diltiazem was switched to amlodipine. All other systems reviewed and are negative.   PHYSICAL EXAM:   VS:  BP (!) 144/72   Pulse 61   Ht 5' 6.5" (1.689 m)   Wt 177 lb 3.2 oz (80.4 kg)   BMI 28.17 kg/m    GEN: Well nourished, well developed, in no acute distress  HEENT: normal  Neck: no JVD, carotid bruits, or masses Cardiac: RRR; no murmurs, rubs, or gallops,no edema  Respiratory:  clear to auscultation bilaterally, normal work of breathing GI: soft, nontender, nondistended, + BS MS: no deformity or atrophy.  Diminished pulses bilaterally. Skin: warm and dry, no rash Neuro:  Alert and Oriented x 3, Strength and sensation are intact Psych: euthymic mood, full affect  Wt Readings from Last 3 Encounters:  02/22/17 177 lb 3.2 oz (80.4 kg)  06/30/16 176  lb 9.6 oz (80.1 kg)  02/23/16 179 lb (81.2 kg)      Studies/Labs Reviewed:   EKG:  EKG normal sinus rhythm, T wave abnormality 1 and aVL.  Otherwise no significant change is noted when compared to prior.  Recent Labs: No results found for requested labs within last 8760 hours.   Lipid Panel    Component Value Date/Time   CHOL 160 08/28/2014 0610   TRIG 108 08/28/2014 0610   HDL 39 (L) 08/28/2014 0610   CHOLHDL 4.1 08/28/2014 0610   VLDL 22 08/28/2014 0610   LDLCALC 99 08/28/2014 0610    Additional studies/ records that were reviewed today include:  No new data. Most recent lipid panel and electrolytes were performed by Dr. Laurann Montana and were at target.  Hemoglobin A1c was less than 7.  LDL was 71.  These tests were done since August 2018    ASSESSMENT:    1. Coronary artery disease involving native coronary artery of native heart with angina pectoris ([place])   2. Chronic combined systolic and diastolic CHF (congestive heart failure) ([place])   3. Essential hypertension   4. Peripheral arterial disease ([place])   5. Other hyperlipidemia   6. Type 2 diabetes mellitus with other circulatory complication, without long-term current use of insulin ([facility])      PLAN:  In order of problems listed above:  1. Increase Carvedilol to 12.5 mg BID to further decrease instances of angina.  Watch for excessive bradycardia. 2. Uptitrating beta-blocker therapy as noted above. 3. Adequate current blood pressure although slightly higher than target systolic in.  Target is less than 140/90 mmHg preferably under 130/85 mmHg. 4. Improved claudication 5. Followed by primary care and at target when last checked within the last 6 months. 6. Most recent A1c was 6.8.  Clinical follow-up in 6 month f/u.Marland Kitchen  Notify us if  the higher dose of carvedilol causes unacceptable side effects such as increased claudication, heart rates less than 50, or blood pressure less than 174 mmHg systolic.  Medication  Adjustments/Labs and Tests Ordered: Current medicines are reviewed at length with the patient today.  Concerns regarding medicines are outlined above.  Medication changes, Labs and Tests ordered today are listed in the Patient Instructions below. There are no Patient Instructions on file for this visit.   Signed, Sinclair Grooms, MD  02/22/2017 9:27 AM    [address], [place], [place]  [address] Phone: [phone]; Fax: [phone]

## 2017-02-22 ENCOUNTER — Encounter: Payer: Self-pay | Admitting: Interventional Cardiology

## 2017-02-22 ENCOUNTER — Ambulatory Visit: Payer: Medicare Other | Admitting: Interventional Cardiology

## 2017-02-22 VITALS — BP 144/72 | HR 61 | Ht 66.5 in | Wt 177.2 lb

## 2017-02-22 DIAGNOSIS — E1159 Type 2 diabetes mellitus with other circulatory complications: Secondary | ICD-10-CM | POA: Diagnosis not present

## 2017-02-22 DIAGNOSIS — I739 Peripheral vascular disease, unspecified: Secondary | ICD-10-CM

## 2017-02-22 DIAGNOSIS — I10 Essential (primary) hypertension: Secondary | ICD-10-CM | POA: Diagnosis not present

## 2017-02-22 DIAGNOSIS — I25119 Atherosclerotic heart disease of native coronary artery with unspecified angina pectoris: Secondary | ICD-10-CM

## 2017-02-22 DIAGNOSIS — E7849 Other hyperlipidemia: Secondary | ICD-10-CM

## 2017-02-22 DIAGNOSIS — I5042 Chronic combined systolic (congestive) and diastolic (congestive) heart failure: Secondary | ICD-10-CM

## 2017-02-22 MED ORDER — CARVEDILOL 12.5 MG PO TABS: 12.5000 mg | ORAL_TABLET | Freq: Two times a day (BID) | ORAL | 3 refills | Status: DC

## 2017-02-22 NOTE — Patient Instructions (Signed)
Medication Instructions:  1) INCREASE Carvedilol to 12.5mg  twice daily  Labwork: None  Testing/Procedures: None  Follow-Up: Your physician wants you to follow-up in: 6 months with Dr. Tamala Julian.  You will receive a reminder letter in the mail two months in advance. If you don't receive a letter, please call our office to schedule the follow-up appointment.   Any Other Special Instructions Will Be Listed Below (If Applicable).     If you need a refill on your cardiac medications before your next appointment, please call your pharmacy.

## 2017-03-17 ENCOUNTER — Ambulatory Visit (HOSPITAL_COMMUNITY)
Admission: RE | Admit: 2017-03-17 | Discharge: 2017-03-17 | Disposition: A | Discharge status: Home / Self Care | Payer: Medicare Other | Source: Ambulatory Visit | Attending: Internal Medicine | Admitting: Internal Medicine

## 2017-03-17 DIAGNOSIS — I6523 Occlusion and stenosis of bilateral carotid arteries: Secondary | ICD-10-CM | POA: Diagnosis not present

## 2017-03-17 DIAGNOSIS — I708 Atherosclerosis of other arteries: Secondary | ICD-10-CM | POA: Insufficient documentation

## 2017-03-17 DIAGNOSIS — I779 Disorder of arteries and arterioles, unspecified: Secondary | ICD-10-CM

## 2017-03-17 DIAGNOSIS — R9439 Abnormal result of other cardiovascular function study: Secondary | ICD-10-CM | POA: Insufficient documentation

## 2017-03-17 DIAGNOSIS — I739 Peripheral vascular disease, unspecified: Secondary | ICD-10-CM | POA: Insufficient documentation

## 2017-03-17 DIAGNOSIS — Z95828 Presence of other vascular implants and grafts: Secondary | ICD-10-CM | POA: Diagnosis not present

## 2017-03-21 ENCOUNTER — Encounter: Payer: Self-pay | Admitting: Interventional Cardiology

## 2017-03-21 ENCOUNTER — Telehealth: Payer: Self-pay | Admitting: *Deleted

## 2017-03-21 DIAGNOSIS — I739 Peripheral vascular disease, unspecified: Secondary | ICD-10-CM

## 2017-03-21 NOTE — Telephone Encounter (Signed)
Patient's wife made aware of results and verbalized her understanding (per dpr). Appointment made for 3/26. Repeat studies has been ordered.

## 2017-03-21 NOTE — Telephone Encounter (Signed)
-----   Message from Wellington Hampshire, MD sent at 03/20/2017  4:06 PM EDT ----- Mildly reduced ABI on the right side which is stable with patent iliac stents.  Repeat study in 1 year.  He is due for follow-up with me.

## 2017-03-28 ENCOUNTER — Encounter: Payer: Self-pay | Admitting: Cardiovascular Disease

## 2017-03-28 ENCOUNTER — Ambulatory Visit: Payer: Medicare Other | Admitting: Cardiovascular Disease

## 2017-03-28 VITALS — BP 136/64 | HR 55 | Ht 66.5 in | Wt 181.0 lb

## 2017-03-28 DIAGNOSIS — I739 Peripheral vascular disease, unspecified: Secondary | ICD-10-CM

## 2017-03-28 DIAGNOSIS — I25118 Atherosclerotic heart disease of native coronary artery with other forms of angina pectoris: Secondary | ICD-10-CM | POA: Diagnosis not present

## 2017-03-28 DIAGNOSIS — I10 Essential (primary) hypertension: Secondary | ICD-10-CM

## 2017-03-28 DIAGNOSIS — I779 Disorder of arteries and arterioles, unspecified: Secondary | ICD-10-CM

## 2017-03-28 NOTE — Patient Instructions (Signed)

## 2017-03-28 NOTE — Progress Notes (Signed)
Cardiology Office Note   Date:  03/28/2017   ID:  [name] [name], DOB [birth year], MRN [id number]  PCP:  [name], MD  Cardiologist: Dr. Tamala Julian  No chief complaint on file.     History of Present Illness: Geroge [name] is a 67 y.o. male who presents for a follow-up visit regarding peripheral arterial disease. He has known history of coronary artery disease status post CABG, chronic diastolic heart failure, hypertension, type 2 diabetes and hyperlipidemia.   He is status post bilateral common iliac artery kissing stent placement in July 2017 for severe claudication.  He is known to have severe heavily calcified disease affecting the right popliteal artery with collaterals.  This was left to be treated medically. He reports stable mild right calf claudication.  He is able to walk for 20 minutes for exercise without having to stop. He had recent worsening of angina and was seen by Dr. Tamala Julian.  The dose of carvedilol was increased with subsequent improvement in symptoms.   Past Medical History:  Diagnosis Date  . Chest pain    a. experience weekly, nitrate responsive c/p x several years.   b. ranexa added on 08/2014 admission   . Chronic combined systolic and diastolic CHF (congestive heart failure) ([place])   . Claudication ([place])    a. 08/2014 ABI: R 0.79, L 1.0-->Walking program instituted.  . Coronary artery disease    a. s/p MI in 1989;  b. 04/2008 CABG x 5 (LIMA->LAD, VG->D1, VG->RI, VG->PDA, L Radial->OM2);  c. 06/2010 PCI/DES to the Diag (2.25x28 Promus Element DES). VG->Diag occluded;  d. 08/2010 Attempted PTCA of the OM2 via the free radial graft. Unsuccessful.  Lesion felt to be 2/2 suture.  c. NSTEMI s/p [facility] with severe native and graph dz; D1 that was stented in 2012 now occulded: Rx therapy recommended  . Hyperlipidemia   . Hypertension   . Type II diabetes mellitus ([place])     Past Surgical History:  Procedure Laterality Date  . CARDIAC CATHETERIZATION N/A 08/28/2014   Procedure: Left Heart Cath and Cors/Grafts Angiography;  Surgeon: Belva Crome, MD;  Location: [place] [facility];  Service: Cardiovascular;  Laterality: N/A;  . COLONOSCOPY    . CORONARY ANGIOPLASTY WITH STENT PLACEMENT    . CORONARY ARTERY BYPASS GRAFT    . PERIPHERAL VASCULAR CATHETERIZATION N/A 07/15/2015   Procedure: Abdominal Aortogram w/Lower Extremity;  Surgeon: Wellington Hampshire, MD;  Location: [place] [facility];  Service: Cardiovascular;  Laterality: N/A;  . PERIPHERAL VASCULAR CATHETERIZATION Bilateral 07/15/2015   Procedure: Peripheral Vascular Intervention;  Surgeon: Wellington Hampshire, MD;  Location: [place] [facility];  Service: Cardiovascular;  Laterality: Bilateral;  Common Iliacs  . SHOULDER ARTHROSCOPY WITH ROTATOR CUFF REPAIR AND SUBACROMIAL DECOMPRESSION Left 05/23/2013   Procedure: LEFT SHOULDER ARTHROSCOPY SUBACROMIAL  DECOMPRESSION DISTAL CLAVICLE RESECTION AND ROTATOR CUFF REPAIR ;  Surgeon: Marin Shutter, MD;  Location: [place];  Service: Orthopedics;  Laterality: Left;  . SHOULDER SURGERY Right 7-8 years ago   Dr. Dorna Leitz     Current Outpatient Medications  Medication Sig Dispense Refill  . acetaminophen (TYLENOL) 500 MG tablet Take 500 mg by mouth daily as needed for mild pain.    Marland Kitchen ALPRAZolam (XANAX) 0.25 MG tablet Take 0.25 mg by mouth 3 (three) times daily as needed for anxiety.     Marland Kitchen amLODipine (NORVASC) 10 MG tablet Take 10 mg by mouth daily.    . Aromatic Inhalants (VICKS VAPOR INHALER IN) Inhale  into the lungs daily as needed (for wheezing).     Marland Kitchen aspirin 81 MG tablet Take 81 mg by mouth daily.     Marland Kitchen atorvastatin (LIPITOR) 40 MG tablet Take 40 mg by mouth daily.    . carvedilol (COREG) 12.5 MG tablet Take 1 tablet (12.5 mg total) by mouth 2 (two) times daily. 180 tablet 3  . cholecalciferol (VITAMIN D) 1000 UNITS tablet Take 1,000 Units by mouth daily.     . clopidogrel (PLAVIX) 75 MG tablet Take 75 mg by mouth daily with breakfast.    . desoximetasone  (TOPICORT) 0.25 % cream Apply 1 application topically once a week.     . ezetimibe (ZETIA) 10 MG tablet TAKE 1 TABLET BY MOUTH  DAILY 90 tablet 3  . fluticasone (FLONASE) 50 MCG/ACT nasal spray Place 2 sprays into the nose daily. For nasal congestion    . isosorbide mononitrate (IMDUR) 120 MG 24 hr tablet Take 120 mg by mouth daily. Take with Imdur 30 mg daily to equal 150 mg total daily.    . isosorbide mononitrate (IMDUR) 30 MG 24 hr tablet TAKE ONE TABLET BY MOUTH ONCE DAILY 90 tablet 3  . metFORMIN (GLUCOPHAGE) 1000 MG tablet Take 1,000 mg by mouth 2 (two) times daily with a meal.    . nitroGLYCERIN (NITROSTAT) 0.4 MG SL tablet Place 0.4 mg under the tongue every 5 (five) minutes x 3 doses as needed. For chest pain    . Polyvinyl Alcohol-Povidone (CLEAR EYES ALL SEASONS OP) Place 1 drop into both eyes daily as needed (for dry eyes).    . quinapril (ACCUPRIL) 20 MG tablet Take 20 mg by mouth at bedtime.    . quinapril (ACCUPRIL) 40 MG tablet Take 40 mg by mouth every morning.    Marland Kitchen RANEXA 500 MG 12 hr tablet TAKE 1 TABLET BY MOUTH TWO  TIMES DAILY 180 tablet 1  . sitaGLIPtin (JANUVIA) 100 MG tablet Take 100 mg by mouth daily.      No current facility-administered medications for this visit.     Allergies:   Aldactone [spironolactone]; Percocet [oxycodone-acetaminophen]; Avandia [rosiglitazone]; and Codeine    Social History:  The patient  reports that he quit smoking about 30 years ago. He has quit using smokeless tobacco. He reports that he drinks about 1.2 oz of alcohol per week. He reports that he does not use drugs.   Family History:  The patient's family history includes Anesthesia problems in his brother; Diabetes in his brother; Heart Problems in his brother, brother, and sister; Heart attack in his father; Heart disease in his mother; Heart failure in his mother; Hypertension in his brother; Kidney failure in his brother; Lung cancer in his mother.    ROS:  Please see the history of  present illness.   Otherwise, review of systems are positive for none.   All other systems are reviewed and negative.    PHYSICAL EXAM: VS:  BP 136/64   Pulse (!) 55   Ht 5' 6.5" (1.689 m)   Wt 181 lb (82.1 kg)   BMI 28.78 kg/m  , BMI Body mass index is 28.78 kg/m. GEN: Well nourished, well developed, in no acute distress  HEENT: normal  Neck: no JVD,  or masses. Faint right carotid bruit Cardiac: RRR; no murmurs, rubs, or gallops,no edema  Respiratory:  clear to auscultation bilaterally, normal work of breathing GI: soft, nontender, nondistended, + BS MS: no deformity or atrophy  Skin: warm and dry, no rash  Neuro:  Strength and sensation are intact Psych: euthymic mood, full affect Vascular: Femoral pulses are normal bilaterally.   EKG:  EKG is not  ordered today.   Recent Labs: No results found for requested labs within last 8760 hours.    Lipid Panel    Component Value Date/Time   CHOL 160 08/28/2014 0610   TRIG 108 08/28/2014 0610   HDL 39 (L) 08/28/2014 0610   CHOLHDL 4.1 08/28/2014 0610   VLDL 22 08/28/2014 0610   LDLCALC 99 08/28/2014 0610      Wt Readings from Last 3 Encounters:  03/28/17 181 lb (82.1 kg)  02/22/17 177 lb 3.2 oz (80.4 kg)  06/30/16 176 lb 9.6 oz (80.1 kg)        ASSESSMENT AND PLAN:  1.  Peripheral arterial disease : Status post bilateral common iliac artery stent placement .  He is doing well overall with only mild right calf claudication due to known popliteal artery stenosis.  Recent noninvasive vascular studies showed normal ABI on the left side and mildly reduced on the right side.  Duplex showed patent iliac stents.  Recommend continuing medical therapy.  2. Coronary artery disease with other forms of angina: Symptoms improved with increasing carvedilol.  3. Essential hypertension: Blood pressure is reasonably controlled now on current medications.  4. Right carotid bruit: Most recent carotid Doppler this month showed mild less  than 40% disease bilaterally.   Disposition:   FU with me in 1 year.   Signed,  Kathlyn Sacramento, MD  03/28/2017 11:07 AM    [place]

## 2017-04-12 ENCOUNTER — Other Ambulatory Visit: Payer: Self-pay | Admitting: Interventional Cardiology

## 2017-04-21 ENCOUNTER — Other Ambulatory Visit: Payer: Self-pay | Admitting: Cardiovascular Disease

## 2017-04-24 NOTE — Telephone Encounter (Signed)
Refill Request.  

## 2017-05-05 ENCOUNTER — Ambulatory Visit
Admission: RE | Admit: 2017-05-05 | Discharge: 2017-05-05 | Disposition: A | Discharge status: Home / Self Care | Payer: Medicare Other | Source: Ambulatory Visit | Attending: Geriatric Medicine | Admitting: Geriatric Medicine

## 2017-05-05 ENCOUNTER — Other Ambulatory Visit: Payer: Self-pay | Admitting: Geriatric Medicine

## 2017-05-05 DIAGNOSIS — J209 Acute bronchitis, unspecified: Secondary | ICD-10-CM

## 2017-05-22 ENCOUNTER — Encounter: Payer: Self-pay | Admitting: Interventional Cardiology

## 2017-05-27 ENCOUNTER — Other Ambulatory Visit: Payer: Self-pay | Admitting: Cardiovascular Disease

## 2017-05-27 DIAGNOSIS — I10 Essential (primary) hypertension: Secondary | ICD-10-CM

## 2017-05-30 ENCOUNTER — Other Ambulatory Visit: Payer: Self-pay | Admitting: Cardiovascular Disease

## 2017-05-30 DIAGNOSIS — I10 Essential (primary) hypertension: Secondary | ICD-10-CM

## 2017-05-30 MED ORDER — ISOSORBIDE MONONITRATE ER 30 MG PO TB24: 30.0000 mg | ORAL_TABLET | Freq: Every day | ORAL | 2 refills | Status: DC

## 2017-05-30 NOTE — Telephone Encounter (Signed)
Please review for refill, Thanks !  

## 2017-06-02 ENCOUNTER — Other Ambulatory Visit: Payer: Self-pay | Admitting: Cardiovascular Disease

## 2017-06-05 NOTE — Telephone Encounter (Signed)
Refill Request.  

## 2017-06-06 NOTE — Telephone Encounter (Signed)
Rx sent to pharmacy   

## 2017-07-12 ENCOUNTER — Other Ambulatory Visit: Payer: Self-pay | Admitting: Cardiovascular Disease

## 2017-07-13 NOTE — Telephone Encounter (Signed)
Refill Request.  

## 2017-08-20 NOTE — Progress Notes (Signed)
Cardiology Office Note:    Date:  08/21/2017   ID:  [name] [name], DOB [birth year], MRN [id number]  PCP:  [name], MD  Cardiologist:  Sinclair Grooms, MD   Referring MD: [name], MD   Chief Complaint  Patient presents with  . Coronary Artery Disease    History of Present Illness:    [name] [name] is a 67 y.o. male with a hx of CAD, prior history of CABG and percutaneous coronary intervention (most recently 2012), chronic combined systolic and diastolic heart failure (EF at cath 2016 was 40-50%), hypertension, hyperlipidemia, and type 2 diabetes.   Drue Stager has had decreased angina during the spring and summer.  Rare nitroglycerin use has been required.  He denies dyspnea, palpitations, syncope, and edema.  Has noticed some swelling of the left lower extremity.  Right calf discomfort if he walks too far or too quickly occurring approximately 5 minutes after starting but able to walk through by 10 minutes and the discomfort goes away.  Past Medical History:  Diagnosis Date  . Chest pain    a. experience weekly, nitrate responsive c/p x several years.   b. ranexa added on 08/2014 admission   . Chronic combined systolic and diastolic CHF (congestive heart failure) ([place])   . Claudication ([place])    a. 08/2014 ABI: R 0.79, L 1.0-->Walking program instituted.  . Coronary artery disease    a. s/p MI in 1989;  b. 04/2008 CABG x 5 (LIMA->LAD, VG->D1, VG->RI, VG->PDA, L Radial->OM2);  c. 06/2010 PCI/DES to the Diag (2.25x28 Promus Element DES). VG->Diag occluded;  d. 08/2010 Attempted PTCA of the OM2 via the free radial graft. Unsuccessful.  Lesion felt to be 2/2 suture.  c. NSTEMI s/p [facility] with severe native and graph dz; D1 that was stented in 2012 now occulded: Rx therapy recommended  . Coronary artery disease involving native coronary artery of native heart with angina pectoris ([place])    a. s/p MI in 1989;  b. 04/2008 CABG x 5 (LIMA->LAD, VG->D1, VG->RI, VG->PDA, L Radial->OM2);  c. 06/2010  PCI/DES to the Diag (2.25x28 Promus Element DES). VG->Diag occluded;  d. 08/2010 Attempted PTCA of the OM2 via the free radial graft. Unsuccessful.  Lesion felt to be 2/2 suture.  c. NSTEMI s/p [facility] with severe native and graph dz; Rx therapy recommended   . Diabetes mellitus ([place]) 06/02/2011  . Hyperlipidemia   . Hypertension   . NSTEMI (non-ST elevated myocardial infarction) ([place]) 08/29/2014  . Peripheral arterial disease ([place]) 08/16/2014  . Type II diabetes mellitus ([place])     Past Surgical History:  Procedure Laterality Date  . CARDIAC CATHETERIZATION N/A 08/28/2014   Procedure: Left Heart Cath and Cors/Grafts Angiography;  Surgeon: Belva Crome, MD;  Location: [place] [facility];  Service: Cardiovascular;  Laterality: N/A;  . COLONOSCOPY    . CORONARY ANGIOPLASTY WITH STENT PLACEMENT    . CORONARY ARTERY BYPASS GRAFT    . PERIPHERAL VASCULAR CATHETERIZATION N/A 07/15/2015   Procedure: Abdominal Aortogram w/Lower Extremity;  Surgeon: Wellington Hampshire, MD;  Location: [place] [facility];  Service: Cardiovascular;  Laterality: N/A;  . PERIPHERAL VASCULAR CATHETERIZATION Bilateral 07/15/2015   Procedure: Peripheral Vascular Intervention;  Surgeon: Wellington Hampshire, MD;  Location: [place] [facility];  Service: Cardiovascular;  Laterality: Bilateral;  Common Iliacs  . SHOULDER ARTHROSCOPY WITH ROTATOR CUFF REPAIR AND SUBACROMIAL DECOMPRESSION Left 05/23/2013   Procedure: LEFT SHOULDER ARTHROSCOPY SUBACROMIAL  DECOMPRESSION DISTAL CLAVICLE RESECTION AND ROTATOR CUFF REPAIR ;  Surgeon: Marin Shutter, MD;  Location: [place];  Service: Orthopedics;  Laterality: Left;  . SHOULDER SURGERY Right 7-8 years ago   Dr. Dorna Leitz    Current Medications: Current Meds  Medication Sig  . acetaminophen (TYLENOL) 500 MG tablet Take 500 mg by mouth daily as needed for mild pain.  Marland Kitchen ALPRAZolam (XANAX) 0.25 MG tablet Take 0.25 mg by mouth 3 (three) times daily as needed for anxiety.   Marland Kitchen amLODipine (NORVASC) 10 MG  tablet Take 10 mg by mouth daily.  . Aromatic Inhalants (VICKS VAPOR INHALER IN) Inhale into the lungs daily as needed (for wheezing).   Marland Kitchen aspirin 81 MG tablet Take 81 mg by mouth daily.   Marland Kitchen atorvastatin (LIPITOR) 40 MG tablet Take 40 mg by mouth daily.  . carvedilol (COREG) 12.5 MG tablet Take 1 tablet (12.5 mg total) by mouth 2 (two) times daily.  . cholecalciferol (VITAMIN D) 1000 UNITS tablet Take 1,000 Units by mouth daily.   . clopidogrel (PLAVIX) 75 MG tablet Take 75 mg by mouth daily with breakfast.  . desoximetasone (TOPICORT) 0.25 % cream Apply 1 application topically once a week.   . ezetimibe (ZETIA) 10 MG tablet TAKE 1 TABLET BY MOUTH  DAILY  . fluticasone (FLONASE) 50 MCG/ACT nasal spray Place 2 sprays into the nose daily. For nasal congestion  . isosorbide mononitrate (IMDUR) 120 MG 24 hr tablet Take 120 mg by mouth daily. Take with Imdur 30 mg daily to equal 150 mg total daily.  . isosorbide mononitrate (IMDUR) 30 MG 24 hr tablet Take 1 tablet (30 mg total) by mouth daily.  . metFORMIN (GLUCOPHAGE) 1000 MG tablet Take 1,000 mg by mouth 2 (two) times daily with a meal.  . nitroGLYCERIN (NITROSTAT) 0.4 MG SL tablet Place 0.4 mg under the tongue every 5 (five) minutes x 3 doses as needed. For chest pain  . Polyvinyl Alcohol-Povidone (CLEAR EYES ALL SEASONS OP) Place 1 drop into both eyes daily as needed (for dry eyes).  . quinapril (ACCUPRIL) 20 MG tablet Take 20 mg by mouth at bedtime.  . quinapril (ACCUPRIL) 40 MG tablet Take 1 tablet (40 mg total) by mouth every morning.  . ranolazine (RANEXA) 500 MG 12 hr tablet Take 1 tablet (500 mg total) by mouth 2 (two) times daily.  . sitaGLIPtin (JANUVIA) 100 MG tablet Take 100 mg by mouth daily.      Allergies:   Aldactone [spironolactone]; Percocet [oxycodone-acetaminophen]; Avandia [rosiglitazone]; and Codeine   Social History   Socioeconomic History  . Marital status: Married    Spouse name: Not on file  . Number of children:  Not on file  . Years of education: Not on file  . Highest education level: Not on file  Occupational History  . Not on file  Social Needs  . Financial resource strain: Not on file  . Food insecurity:    Worry: Not on file    Inability: Not on file  . Transportation needs:    Medical: Not on file    Non-medical: Not on file  Tobacco Use  . Smoking status: Former Smoker    Last attempt to quit: 01/04/1987    Years since quitting: 30.6  . Smokeless tobacco: Former Network engineer and Sexual Activity  . Alcohol use: Yes    Alcohol/week: 2.0 standard drinks    Types: 2 Cans of beer per week    Comment: a week  . Drug use: No  . Sexual activity: Not on  file  Lifestyle  . Physical activity:    Days per week: Not on file    Minutes per session: Not on file  . Stress: Not on file  Relationships  . Social connections:    Talks on phone: Not on file    Gets together: Not on file    Attends religious service: Not on file    Active member of club or organization: Not on file    Attends meetings of clubs or organizations: Not on file    Relationship status: Not on file  Other Topics Concern  . Not on file  Social History Narrative   Lives in [place] with his wife.  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     Family History: The patient's family history includes Anesthesia problems in his brother; Diabetes in his brother; Heart Problems in his brother, brother, and sister; Heart attack in his father; Heart disease in his mother; Heart failure in his mother; Hypertension in his brother; Kidney failure in his brother; Lung cancer in his mother.  ROS:   Please see the history of present illness.    Left lower extremity edema all other systems reviewed and are negative.  EKGs/Labs/Other Studies Reviewed:    The following studies were reviewed today: No new data He is having routine ABI and bilateral carotid Doppler studies.  EKG:  EKG is not ordered today.    Recent Labs: No results  found for requested labs within last 8760 hours.  Recent Lipid Panel    Component Value Date/Time   CHOL 160 08/28/2014 0610   TRIG 108 08/28/2014 0610   HDL 39 (L) 08/28/2014 0610   CHOLHDL 4.1 08/28/2014 0610   VLDL 22 08/28/2014 0610   LDLCALC 99 08/28/2014 0610    Physical Exam:    VS:  BP (!) 142/64   Pulse 62   Ht 5\' 6"  (1.676 m)   Wt 171 lb 6.4 oz (77.7 kg)   BMI 27.66 kg/m     Wt Readings from Last 3 Encounters:  08/21/17 171 lb 6.4 oz (77.7 kg)  03/28/17 181 lb (82.1 kg)  02/22/17 177 lb 3.2 oz (80.4 kg)     GEN:  Well nourished, well developed in no acute distress HEENT: Normal NECK: No JVD.  Bilateral soft carotid bruits LYMPHATICS: No lymphadenopathy CARDIAC: RRR, faint right upper sternal systolic murmur, no gallop, no edema. VASCULAR: Diminished right pedal pulses.  Bilateral soft carotid bruits. RESPIRATORY:  Clear to auscultation without rales, wheezing or rhonchi  ABDOMEN: Soft, non-tender, non-distended, No pulsatile mass, MUSCULOSKELETAL: No deformity  SKIN: Warm and dry NEUROLOGIC:  Alert and oriented x 3 PSYCHIATRIC:  Normal affect   ASSESSMENT:    1. Coronary artery disease involving native coronary artery of native heart with angina pectoris ([place])   2. Other hyperlipidemia   3. Chronic combined systolic and diastolic CHF (congestive heart failure) ([place])   4. Essential hypertension   5. Peripheral arterial disease ([place])   6. Bilateral carotid artery stenosis    PLAN:    In order of problems listed above:  1. Stable angina class II in the summer and III in the winter.  Encouraged to notify us if change in typical pattern. 2. LDL target less than 70.  This is followed by primary care.  Most recent values were excellent last summer with LDL 44.  Has an upcoming visit with Dr. Laurann Montana for reevaluation. 3. He is on guideline directed therapy for systolic dysfunction with ACE  inhibitor therapy and beta-blocker therapy titrated to near maximal  doses.  No heart failure symptoms or clinical evidence of volume overload.  No change required. 4. Plan systolic blood pressure control to less than 150 and preferably around 140 or less.  Has never had a diastolic blood pressure problem.  Up titration of carvedilol therapy has significantly improved systolic pressures. 5. Right calf claudication with walk-through.  Stable. 6. Bilateral carotid bruits with yearly ultrasound follow-up already planned.   Medication Adjustments/Labs and Tests Ordered: Current medicines are reviewed at length with the patient today.  Concerns regarding medicines are outlined above.  Orders Placed This Encounter  Procedures  . EKG 12-Lead   No orders of the defined types were placed in this encounter.   Patient Instructions  Medication Instructions:  Your physician recommends that you continue on your current medications as directed. Please refer to the Current Medication list given to you today.   Labwork: NONE ORDERED TODAY  Testing/Procedures: NONE ORDERED TODAY  Follow-Up: FOLLOW UP WITH DR. Tamala Julian IN 9-12 MONTHS   Any Other Special Instructions Will Be Listed Below (If Applicable).     If you need a refill on your cardiac medications before your next appointment, please call your pharmacy.      Signed, Sinclair Grooms, MD  08/21/2017 12:08 PM    [place]

## 2017-08-21 ENCOUNTER — Encounter: Payer: Self-pay | Admitting: Interventional Cardiology

## 2017-08-21 ENCOUNTER — Ambulatory Visit: Payer: Medicare Other | Admitting: Interventional Cardiology

## 2017-08-21 VITALS — BP 142/64 | HR 62 | Ht 66.0 in | Wt 171.4 lb

## 2017-08-21 DIAGNOSIS — I5042 Chronic combined systolic (congestive) and diastolic (congestive) heart failure: Secondary | ICD-10-CM | POA: Diagnosis not present

## 2017-08-21 DIAGNOSIS — I25119 Atherosclerotic heart disease of native coronary artery with unspecified angina pectoris: Secondary | ICD-10-CM | POA: Diagnosis not present

## 2017-08-21 DIAGNOSIS — I10 Essential (primary) hypertension: Secondary | ICD-10-CM

## 2017-08-21 DIAGNOSIS — I739 Peripheral vascular disease, unspecified: Secondary | ICD-10-CM

## 2017-08-21 DIAGNOSIS — E7849 Other hyperlipidemia: Secondary | ICD-10-CM

## 2017-08-21 DIAGNOSIS — I6523 Occlusion and stenosis of bilateral carotid arteries: Secondary | ICD-10-CM

## 2017-08-21 NOTE — Patient Instructions (Signed)
Medication Instructions:  Your physician recommends that you continue on your current medications as directed. Please refer to the Current Medication list given to you today.   Labwork: NONE ORDERED TODAY  Testing/Procedures: NONE ORDERED TODAY  Follow-Up: FOLLOW UP WITH DR. Tamala Julian IN 9-12 MONTHS   Any Other Special Instructions Will Be Listed Below (If Applicable).     If you need a refill on your cardiac medications before your next appointment, please call your pharmacy.

## 2017-11-14 ENCOUNTER — Other Ambulatory Visit: Payer: Self-pay | Admitting: Interventional Cardiology

## 2017-11-14 MED ORDER — QUINAPRIL HCL 40 MG PO TABS: 40.0000 mg | ORAL_TABLET | ORAL | 2 refills | Status: DC

## 2017-11-26 ENCOUNTER — Other Ambulatory Visit: Payer: Self-pay | Admitting: Cardiovascular Disease

## 2017-11-26 DIAGNOSIS — E785 Hyperlipidemia, unspecified: Secondary | ICD-10-CM

## 2017-11-27 NOTE — Telephone Encounter (Signed)
Please review for refill, Thanks !  

## 2017-12-07 ENCOUNTER — Other Ambulatory Visit: Payer: Self-pay | Admitting: Interventional Cardiology

## 2018-01-24 ENCOUNTER — Other Ambulatory Visit: Payer: Self-pay | Admitting: Cardiovascular Disease

## 2018-01-24 DIAGNOSIS — I10 Essential (primary) hypertension: Secondary | ICD-10-CM

## 2018-01-25 NOTE — Telephone Encounter (Signed)
Refill Request.  

## 2018-02-05 ENCOUNTER — Other Ambulatory Visit: Payer: Self-pay | Admitting: Cardiovascular Disease

## 2018-02-06 NOTE — Telephone Encounter (Signed)
Please advise for refill, Thanks !

## 2018-03-19 NOTE — Telephone Encounter (Signed)
Please review for refill. Thanks!  

## 2018-03-20 ENCOUNTER — Emergency Department (HOSPITAL_COMMUNITY): Payer: Medicare Other

## 2018-03-20 ENCOUNTER — Encounter (HOSPITAL_COMMUNITY): Payer: Self-pay | Admitting: Emergency Medicine

## 2018-03-20 ENCOUNTER — Inpatient Hospital Stay (HOSPITAL_COMMUNITY): Payer: Medicare Other

## 2018-03-20 ENCOUNTER — Other Ambulatory Visit: Payer: Self-pay

## 2018-03-20 ENCOUNTER — Inpatient Hospital Stay (HOSPITAL_COMMUNITY)
Admission: EM | Admit: 2018-03-20 | Discharge: 2018-03-24 | DRG: 280 | Disposition: A | Discharge status: Home / Self Care | Payer: Medicare Other | Attending: Cardiology | Admitting: Cardiology

## 2018-03-20 ENCOUNTER — Encounter (HOSPITAL_COMMUNITY)
Admission: EM | Disposition: A | Discharge status: Home / Self Care | Payer: Self-pay | Source: Home / Self Care | Attending: Cardiology

## 2018-03-20 DIAGNOSIS — E118 Type 2 diabetes mellitus with unspecified complications: Secondary | ICD-10-CM | POA: Diagnosis not present

## 2018-03-20 DIAGNOSIS — I340 Nonrheumatic mitral (valve) insufficiency: Secondary | ICD-10-CM | POA: Diagnosis not present

## 2018-03-20 DIAGNOSIS — E871 Hypo-osmolality and hyponatremia: Secondary | ICD-10-CM | POA: Diagnosis present

## 2018-03-20 DIAGNOSIS — I252 Old myocardial infarction: Secondary | ICD-10-CM

## 2018-03-20 DIAGNOSIS — E119 Type 2 diabetes mellitus without complications: Secondary | ICD-10-CM

## 2018-03-20 DIAGNOSIS — D72829 Elevated white blood cell count, unspecified: Secondary | ICD-10-CM | POA: Diagnosis present

## 2018-03-20 DIAGNOSIS — Z79899 Other long term (current) drug therapy: Secondary | ICD-10-CM

## 2018-03-20 DIAGNOSIS — I739 Peripheral vascular disease, unspecified: Secondary | ICD-10-CM | POA: Diagnosis not present

## 2018-03-20 DIAGNOSIS — Z87891 Personal history of nicotine dependence: Secondary | ICD-10-CM

## 2018-03-20 DIAGNOSIS — E782 Mixed hyperlipidemia: Secondary | ICD-10-CM | POA: Diagnosis not present

## 2018-03-20 DIAGNOSIS — I25810 Atherosclerosis of coronary artery bypass graft(s) without angina pectoris: Secondary | ICD-10-CM | POA: Diagnosis present

## 2018-03-20 DIAGNOSIS — I200 Unstable angina: Secondary | ICD-10-CM | POA: Diagnosis not present

## 2018-03-20 DIAGNOSIS — D72828 Other elevated white blood cell count: Secondary | ICD-10-CM | POA: Diagnosis not present

## 2018-03-20 DIAGNOSIS — I10 Essential (primary) hypertension: Secondary | ICD-10-CM | POA: Diagnosis not present

## 2018-03-20 DIAGNOSIS — E1151 Type 2 diabetes mellitus with diabetic peripheral angiopathy without gangrene: Secondary | ICD-10-CM | POA: Diagnosis present

## 2018-03-20 DIAGNOSIS — Z8249 Family history of ischemic heart disease and other diseases of the circulatory system: Secondary | ICD-10-CM | POA: Diagnosis not present

## 2018-03-20 DIAGNOSIS — R0902 Hypoxemia: Secondary | ICD-10-CM | POA: Diagnosis present

## 2018-03-20 DIAGNOSIS — I472 Ventricular tachycardia: Secondary | ICD-10-CM | POA: Diagnosis present

## 2018-03-20 DIAGNOSIS — R079 Chest pain, unspecified: Secondary | ICD-10-CM | POA: Diagnosis present

## 2018-03-20 DIAGNOSIS — I5043 Acute on chronic combined systolic (congestive) and diastolic (congestive) heart failure: Secondary | ICD-10-CM | POA: Diagnosis present

## 2018-03-20 DIAGNOSIS — I25119 Atherosclerotic heart disease of native coronary artery with unspecified angina pectoris: Secondary | ICD-10-CM | POA: Diagnosis present

## 2018-03-20 DIAGNOSIS — I2510 Atherosclerotic heart disease of native coronary artery without angina pectoris: Secondary | ICD-10-CM | POA: Diagnosis not present

## 2018-03-20 DIAGNOSIS — I110 Hypertensive heart disease with heart failure: Secondary | ICD-10-CM | POA: Diagnosis present

## 2018-03-20 DIAGNOSIS — E876 Hypokalemia: Secondary | ICD-10-CM | POA: Diagnosis present

## 2018-03-20 DIAGNOSIS — I214 Non-ST elevation (NSTEMI) myocardial infarction: Principal | ICD-10-CM | POA: Diagnosis present

## 2018-03-20 DIAGNOSIS — E7800 Pure hypercholesterolemia, unspecified: Secondary | ICD-10-CM | POA: Diagnosis not present

## 2018-03-20 DIAGNOSIS — Z833 Family history of diabetes mellitus: Secondary | ICD-10-CM | POA: Diagnosis not present

## 2018-03-20 DIAGNOSIS — J810 Acute pulmonary edema: Secondary | ICD-10-CM | POA: Diagnosis present

## 2018-03-20 DIAGNOSIS — E785 Hyperlipidemia, unspecified: Secondary | ICD-10-CM | POA: Diagnosis present

## 2018-03-20 DIAGNOSIS — I25710 Atherosclerosis of autologous vein coronary artery bypass graft(s) with unstable angina pectoris: Secondary | ICD-10-CM | POA: Diagnosis not present

## 2018-03-20 DIAGNOSIS — I5042 Chronic combined systolic (congestive) and diastolic (congestive) heart failure: Secondary | ICD-10-CM | POA: Diagnosis present

## 2018-03-20 HISTORY — PX: LEFT HEART CATH AND CORS/GRAFTS ANGIOGRAPHY: CATH118250

## 2018-03-20 LAB — COMPREHENSIVE METABOLIC PANEL
ALT: 17 U/L (ref 0–44)
ALT: 24 U/L (ref 0–44)
AST: 21 U/L (ref 15–41)
AST: 83 U/L — ABNORMAL HIGH (ref 15–41)
Albumin: 4.2 g/dL (ref 3.5–5.0)
Albumin: 3.8 g/dL (ref 3.5–5.0)
Alkaline Phosphatase: 76 U/L (ref 38–126)
Alkaline Phosphatase: 73 U/L (ref 38–126)
Anion gap: 12 (ref 5–15)
Anion gap: 12 (ref 5–15)
BUN: 14 mg/dL (ref 8–23)
BUN: 15 mg/dL (ref 8–23)
CHLORIDE: 97 mmol/L — AB (ref 98–111)
CO2: 19 mmol/L — ABNORMAL LOW (ref 22–32)
CO2: 18 mmol/L — ABNORMAL LOW (ref 22–32)
CREATININE: 1.15 mg/dL (ref 0.61–1.24)
Calcium: 9.6 mg/dL (ref 8.9–10.3)
Calcium: 9.1 mg/dL (ref 8.9–10.3)
Chloride: 100 mmol/L (ref 98–111)
Creatinine, Ser: 1.06 mg/dL (ref 0.61–1.24)
GFR calc Af Amer: >60 mL/min (ref >60)
Glucose, Bld: 200 mg/dL — ABNORMAL HIGH (ref 70–99)
Glucose, Bld: 214 mg/dL — ABNORMAL HIGH (ref 70–99)
Potassium: 4.2 mmol/L (ref 3.5–5.1)
Potassium: 3.9 mmol/L (ref 3.5–5.1)
Sodium: 128 mmol/L — ABNORMAL LOW (ref 135–145)
Sodium: 130 mmol/L — ABNORMAL LOW (ref 135–145)
Total Bilirubin: 0.9 mg/dL (ref 0.3–1.2)
Total Bilirubin: 1.2 mg/dL (ref 0.3–1.2)
Total Protein: 7.7 g/dL (ref 6.5–8.1)
Total Protein: 7.6 g/dL (ref 6.5–8.1)

## 2018-03-20 LAB — CREATININE, SERUM
Creatinine, Ser: 1.04 mg/dL (ref 0.61–1.24)
GFR calc Af Amer: >60 mL/min (ref >60)
GFR calc non Af Amer: >60 mL/min (ref >60)

## 2018-03-20 LAB — CBC WITH DIFFERENTIAL/PLATELET
Abs Immature Granulocytes: 0.06 10*3/uL (ref 0.00–0.07)
Basophils Absolute: 0 10*3/uL (ref 0.0–0.1)
Basophils Relative: 0 %
Eosinophils Absolute: 0 10*3/uL (ref 0.0–0.5)
Eosinophils Relative: 0 %
HCT: 40.3 % (ref 39.0–52.0)
HEMOGLOBIN: 14 g/dL (ref 13.0–17.0)
Immature Granulocytes: 1 %
Lymphocytes Relative: 15 %
Lymphs Abs: 1.7 10*3/uL (ref 0.7–4.0)
MCH: 30.9 pg (ref 26.0–34.0)
MCHC: 34.7 g/dL (ref 30.0–36.0)
MCV: 89 fL (ref 80.0–100.0)
Monocytes Absolute: 1 10*3/uL (ref 0.1–1.0)
Monocytes Relative: 9 %
NRBC: 0 % (ref 0.0–0.2)
Neutro Abs: 8.7 10*3/uL — ABNORMAL HIGH (ref 1.7–7.7)
Neutrophils Relative %: 75 %
Platelets: 184 10*3/uL (ref 150–400)
RBC: 4.53 MIL/uL (ref 4.22–5.81)
RDW: 12.9 % (ref 11.5–15.5)
WBC: 11.5 10*3/uL — ABNORMAL HIGH (ref 4.0–10.5)

## 2018-03-20 LAB — POCT ACTIVATED CLOTTING TIME: Activated Clotting Time: 136 seconds

## 2018-03-20 LAB — GLUCOSE, CAPILLARY
Glucose-Capillary: 204 mg/dL — ABNORMAL HIGH (ref 70–99)
Glucose-Capillary: 172 mg/dL — ABNORMAL HIGH (ref 70–99)
Glucose-Capillary: 180 mg/dL — ABNORMAL HIGH (ref 70–99)
Glucose-Capillary: 177 mg/dL — ABNORMAL HIGH (ref 70–99)

## 2018-03-20 LAB — CBC
HCT: 41.9 % (ref 39.0–52.0)
HCT: 41.4 % (ref 39.0–52.0)
HEMOGLOBIN: 14.3 g/dL (ref 13.0–17.0)
Hemoglobin: 14 g/dL (ref 13.0–17.0)
MCH: 30.9 pg (ref 26.0–34.0)
MCH: 30 pg (ref 26.0–34.0)
MCHC: 34.1 g/dL (ref 30.0–36.0)
MCHC: 33.8 g/dL (ref 30.0–36.0)
MCV: 90.5 fL (ref 80.0–100.0)
MCV: 88.8 fL (ref 80.0–100.0)
Platelets: 177 10*3/uL (ref 150–400)
Platelets: 174 10*3/uL (ref 150–400)
RBC: 4.63 MIL/uL (ref 4.22–5.81)
RBC: 4.66 MIL/uL (ref 4.22–5.81)
RDW: 12.9 % (ref 11.5–15.5)
RDW: 12.8 % (ref 11.5–15.5)
WBC: 14.2 10*3/uL — ABNORMAL HIGH (ref 4.0–10.5)
WBC: 11.8 10*3/uL — ABNORMAL HIGH (ref 4.0–10.5)
nRBC: 0 % (ref 0.0–0.2)
nRBC: 0 % (ref 0.0–0.2)

## 2018-03-20 LAB — TROPONIN I
Troponin I: 17.04 ng/mL (ref <0.03)
Troponin I: 23.93 ng/mL (ref <0.03)
Troponin I: 25.22 ng/mL (ref <0.03)

## 2018-03-20 LAB — MAGNESIUM: MAGNESIUM: 1.4 mg/dL — AB (ref 1.7–2.4)

## 2018-03-20 LAB — PROTIME-INR
INR: 1.2 (ref 0.8–1.2)
Prothrombin Time: 14.7 seconds (ref 11.4–15.2)

## 2018-03-20 LAB — LIPID PANEL
Cholesterol: 114 mg/dL (ref 0–200)
HDL: 47 mg/dL (ref >40)
LDL CALC: 55 mg/dL (ref 0–99)
Total CHOL/HDL Ratio: 2.4 RATIO
Triglycerides: 58 mg/dL (ref <150)
VLDL: 12 mg/dL (ref 0–40)

## 2018-03-20 LAB — APTT: aPTT: 60 seconds — ABNORMAL HIGH (ref 24–36)

## 2018-03-20 LAB — ECHOCARDIOGRAM COMPLETE
Height: 67 in
WEIGHTICAEL: 2720 [oz_av]

## 2018-03-20 LAB — HEPARIN LEVEL (UNFRACTIONATED)
Heparin Unfractionated: <0.1 IU/mL — ABNORMAL LOW (ref 0.30–0.70)
Heparin Unfractionated: 0.26 IU/mL — ABNORMAL LOW (ref 0.30–0.70)

## 2018-03-20 LAB — MRSA PCR SCREENING: MRSA by PCR: NEGATIVE

## 2018-03-20 LAB — I-STAT TROPONIN, ED: Troponin i, poc: 0.02 ng/mL (ref 0.00–0.08)

## 2018-03-20 LAB — HEMOGLOBIN A1C
Hgb A1c MFr Bld: 7.1 % — ABNORMAL HIGH (ref 4.8–5.6)
Mean Plasma Glucose: 157.07 mg/dL

## 2018-03-20 LAB — TSH: TSH: 1.664 u[IU]/mL (ref 0.350–4.500)

## 2018-03-20 LAB — BRAIN NATRIURETIC PEPTIDE: B Natriuretic Peptide: 376.7 pg/mL — ABNORMAL HIGH (ref 0.0–100.0)

## 2018-03-20 SURGERY — LEFT HEART CATH AND CORS/GRAFTS ANGIOGRAPHY
Anesthesia: LOCAL

## 2018-03-20 MED ORDER — SODIUM CHLORIDE 0.9 % IV SOLN: INTRAVENOUS | Status: DC

## 2018-03-20 MED ORDER — SODIUM CHLORIDE 0.9% FLUSH
3.0000 mL | Freq: Two times a day (BID) | INTRAVENOUS | Status: DC
  Administered 2018-03-20: 3 mL via INTRAVENOUS

## 2018-03-20 MED ORDER — NITROGLYCERIN 0.4 MG SL SUBL: 0.4000 mg | SUBLINGUAL_TABLET | SUBLINGUAL | Status: DC | PRN

## 2018-03-20 MED ORDER — ASPIRIN 81 MG PO CHEW: 81.0000 mg | CHEWABLE_TABLET | ORAL | Status: DC

## 2018-03-20 MED ORDER — HEPARIN BOLUS VIA INFUSION
4000.0000 [IU] | Freq: Once | INTRAVENOUS | Status: AC
  Administered 2018-03-20: 4000 [IU] via INTRAVENOUS
  Filled 2018-03-20: qty 4000

## 2018-03-20 MED ORDER — MAGNESIUM SULFATE 2 GM/50ML IV SOLN
2.0000 g | Freq: Once | INTRAVENOUS | Status: AC
  Administered 2018-03-20: 2 g via INTRAVENOUS
  Filled 2018-03-20: qty 50

## 2018-03-20 MED ORDER — IOHEXOL 350 MG/ML SOLN
80.0000 mL | Freq: Once | INTRAVENOUS | Status: AC | PRN
  Administered 2018-03-20: 80 mL via INTRAVENOUS

## 2018-03-20 MED ORDER — FENTANYL CITRATE (PF) 100 MCG/2ML IJ SOLN
INTRAMUSCULAR | Status: DC | PRN
  Administered 2018-03-20: 25 ug via INTRAVENOUS

## 2018-03-20 MED ORDER — LIDOCAINE HCL (PF) 1 % IJ SOLN
INTRAMUSCULAR | Status: AC
  Filled 2018-03-20: qty 30

## 2018-03-20 MED ORDER — ONDANSETRON HCL 4 MG/2ML IJ SOLN: 4.0000 mg | Freq: Four times a day (QID) | INTRAMUSCULAR | Status: DC | PRN

## 2018-03-20 MED ORDER — SODIUM CHLORIDE 0.9 % IV SOLN
INTRAVENOUS | Status: DC
  Administered 2018-03-20: 13:00:00 via INTRAVENOUS

## 2018-03-20 MED ORDER — ASPIRIN 81 MG PO CHEW
324.0000 mg | CHEWABLE_TABLET | ORAL | Status: AC
  Administered 2018-03-20: 324 mg via ORAL
  Filled 2018-03-20: qty 4

## 2018-03-20 MED ORDER — AMLODIPINE BESYLATE 10 MG PO TABS
10.0000 mg | ORAL_TABLET | Freq: Every day | ORAL | Status: DC
  Administered 2018-03-20: 10 mg via ORAL
  Filled 2018-03-20: qty 1

## 2018-03-20 MED ORDER — RANOLAZINE ER 500 MG PO TB12
500.0000 mg | ORAL_TABLET | Freq: Two times a day (BID) | ORAL | Status: DC
  Administered 2018-03-20 – 2018-03-24 (×9): 500 mg via ORAL
  Filled 2018-03-20 (×9): qty 1

## 2018-03-20 MED ORDER — ASPIRIN 81 MG PO TABS: 81.0000 mg | ORAL_TABLET | Freq: Every day | ORAL | Status: DC

## 2018-03-20 MED ORDER — ASPIRIN EC 81 MG PO TBEC
81.0000 mg | DELAYED_RELEASE_TABLET | Freq: Every day | ORAL | Status: DC
  Administered 2018-03-21 – 2018-03-24 (×4): 81 mg via ORAL
  Filled 2018-03-20 (×4): qty 1

## 2018-03-20 MED ORDER — ASPIRIN 300 MG RE SUPP
300.0000 mg | RECTAL | Status: AC
  Filled 2018-03-20: qty 1

## 2018-03-20 MED ORDER — MIDAZOLAM HCL 2 MG/2ML IJ SOLN
INTRAMUSCULAR | Status: AC
  Filled 2018-03-20: qty 2

## 2018-03-20 MED ORDER — SODIUM CHLORIDE 0.9 % IV SOLN: 250.0000 mL | INTRAVENOUS | Status: DC | PRN

## 2018-03-20 MED ORDER — HEPARIN (PORCINE) 25000 UT/250ML-% IV SOLN
1200.0000 [IU]/h | INTRAVENOUS | Status: DC
  Administered 2018-03-20: 1000 [IU]/h via INTRAVENOUS
  Filled 2018-03-20: qty 250

## 2018-03-20 MED ORDER — SODIUM CHLORIDE 0.9% FLUSH
3.0000 mL | Freq: Two times a day (BID) | INTRAVENOUS | Status: DC
  Administered 2018-03-20 – 2018-03-24 (×7): 3 mL via INTRAVENOUS

## 2018-03-20 MED ORDER — NITROGLYCERIN IN D5W 200-5 MCG/ML-% IV SOLN
INTRAVENOUS | Status: AC
  Filled 2018-03-20: qty 250

## 2018-03-20 MED ORDER — ALPRAZOLAM 0.25 MG PO TABS: 0.2500 mg | ORAL_TABLET | Freq: Three times a day (TID) | ORAL | Status: DC | PRN

## 2018-03-20 MED ORDER — LIDOCAINE HCL (PF) 1 % IJ SOLN
INTRAMUSCULAR | Status: DC | PRN
  Administered 2018-03-20: 15 mL via INTRADERMAL

## 2018-03-20 MED ORDER — ENOXAPARIN SODIUM 40 MG/0.4ML ~~LOC~~ SOLN: 40.0000 mg | SUBCUTANEOUS | Status: DC

## 2018-03-20 MED ORDER — FUROSEMIDE 10 MG/ML IJ SOLN
40.0000 mg | Freq: Once | INTRAMUSCULAR | Status: AC
  Administered 2018-03-20: 40 mg via INTRAVENOUS
  Filled 2018-03-20: qty 4

## 2018-03-20 MED ORDER — HEPARIN (PORCINE) IN NACL 1000-0.9 UT/500ML-% IV SOLN
INTRAVENOUS | Status: AC
  Filled 2018-03-20: qty 1000

## 2018-03-20 MED ORDER — SODIUM CHLORIDE 0.9% FLUSH
3.0000 mL | Freq: Once | INTRAVENOUS | Status: AC
  Administered 2018-03-20: 3 mL via INTRAVENOUS

## 2018-03-20 MED ORDER — EZETIMIBE 10 MG PO TABS
10.0000 mg | ORAL_TABLET | Freq: Every day | ORAL | Status: DC
  Administered 2018-03-20 – 2018-03-24 (×5): 10 mg via ORAL
  Filled 2018-03-20 (×5): qty 1

## 2018-03-20 MED ORDER — SODIUM CHLORIDE 0.9% FLUSH
3.0000 mL | INTRAVENOUS | Status: DC | PRN
  Administered 2018-03-21: 3 mL via INTRAVENOUS
  Filled 2018-03-20: qty 3

## 2018-03-20 MED ORDER — NITROGLYCERIN IN D5W 200-5 MCG/ML-% IV SOLN
0.0000 ug/min | INTRAVENOUS | Status: DC
  Administered 2018-03-20: 5 ug/min via INTRAVENOUS

## 2018-03-20 MED ORDER — ATORVASTATIN CALCIUM 80 MG PO TABS
80.0000 mg | ORAL_TABLET | Freq: Every day | ORAL | Status: DC
  Administered 2018-03-20 – 2018-03-23 (×4): 80 mg via ORAL
  Filled 2018-03-20 (×4): qty 1

## 2018-03-20 MED ORDER — IOHEXOL 350 MG/ML SOLN
INTRAVENOUS | Status: DC | PRN
  Administered 2018-03-20: 95 mL via INTRA_ARTERIAL

## 2018-03-20 MED ORDER — FUROSEMIDE 10 MG/ML IJ SOLN
40.0000 mg | Freq: Two times a day (BID) | INTRAMUSCULAR | Status: DC
  Administered 2018-03-20 – 2018-03-22 (×5): 40 mg via INTRAVENOUS
  Filled 2018-03-20 (×5): qty 4

## 2018-03-20 MED ORDER — SODIUM CHLORIDE 0.9% FLUSH: 3.0000 mL | INTRAVENOUS | Status: DC | PRN

## 2018-03-20 MED ORDER — MIDAZOLAM HCL 2 MG/2ML IJ SOLN
INTRAMUSCULAR | Status: DC | PRN
  Administered 2018-03-20: 1 mg via INTRAVENOUS

## 2018-03-20 MED ORDER — INSULIN ASPART 100 UNIT/ML ~~LOC~~ SOLN
0.0000 [IU] | Freq: Three times a day (TID) | SUBCUTANEOUS | Status: DC
  Administered 2018-03-20 – 2018-03-21 (×3): 3 [IU] via SUBCUTANEOUS
  Administered 2018-03-22: 2 [IU] via SUBCUTANEOUS
  Administered 2018-03-22: 5 [IU] via SUBCUTANEOUS
  Administered 2018-03-22: 8 [IU] via SUBCUTANEOUS
  Administered 2018-03-23: 3 [IU] via SUBCUTANEOUS
  Administered 2018-03-23 (×2): 8 [IU] via SUBCUTANEOUS
  Administered 2018-03-24: 5 [IU] via SUBCUTANEOUS

## 2018-03-20 MED ORDER — CLOPIDOGREL BISULFATE 75 MG PO TABS: 75.0000 mg | ORAL_TABLET | Freq: Every day | ORAL | Status: DC

## 2018-03-20 MED ORDER — FENTANYL CITRATE (PF) 100 MCG/2ML IJ SOLN
INTRAMUSCULAR | Status: AC
  Filled 2018-03-20: qty 2

## 2018-03-20 MED ORDER — CLOPIDOGREL BISULFATE 75 MG PO TABS
75.0000 mg | ORAL_TABLET | Freq: Every day | ORAL | Status: DC
  Administered 2018-03-20 – 2018-03-24 (×5): 75 mg via ORAL
  Filled 2018-03-20 (×5): qty 1

## 2018-03-20 MED ORDER — NITROGLYCERIN IN D5W 200-5 MCG/ML-% IV SOLN: 0.0000 ug/min | INTRAVENOUS | Status: DC

## 2018-03-20 MED ORDER — HEPARIN (PORCINE) IN NACL 1000-0.9 UT/500ML-% IV SOLN
INTRAVENOUS | Status: DC | PRN
  Administered 2018-03-20 (×2): 500 mL

## 2018-03-20 SURGICAL SUPPLY — 10 items
CATH INFINITI 5 FR IM (CATHETERS) ×2 IMPLANT
CATH INFINITI 5FR MULTPACK ANG (CATHETERS) ×2 IMPLANT
KIT HEART LEFT (KITS) ×2 IMPLANT
PACK CARDIAC CATHETERIZATION (CUSTOM PROCEDURE TRAY) ×2 IMPLANT
SHEATH PINNACLE 5F 10CM (SHEATH) ×2 IMPLANT
SHEATH PROBE COVER 6X72 (BAG) ×2 IMPLANT
TRANSDUCER W/STOPCOCK (MISCELLANEOUS) ×2 IMPLANT
TUBING CIL FLEX 10 FLL-RA (TUBING) ×2 IMPLANT
WIRE EMERALD 3MM-J .035X150CM (WIRE) ×2 IMPLANT
WIRE EMERALD 3MM-J .035X260CM (WIRE) ×2 IMPLANT

## 2018-03-20 NOTE — ED Notes (Signed)
ED TO INPATIENT HANDOFF REPORT  ED Nurse Name and Phone #:  Freida Busman [phone]  S Name/Age/Gender [name] [name] 68 y.o. male Room/Bed: 026C/026C  Code Status   Code Status: Prior  Home/SNF/Other Home Patient oriented to: self, place, time and situation Is this baseline? Yes   Triage Complete: Triage complete  Chief Complaint chest pain   Triage Note Pt brought in by [facility] EMS c/o CP 8/10 that came on suddenly while he was laying in bed. Pt has an extensive cardiac hx and has had multiple MIs in the past. Pt took 2 ASA and 2 sublingual nitro prior to EMS arrival to his house. EMS gave 6 more of sublingual nitro and placed a 1 1/2 inch nitro paste. Vitals prior to arrival: BP 147-76, HR 90, SpO2 894% RA, CBG 207.   Allergies Allergies  Allergen Reactions  . Aldactone [Spironolactone] Nausea And Vomiting  . Percocet [Oxycodone-Acetaminophen] Other (See Comments)    "nightmares, stomach problems, and foggy brain"  . Avandia [Rosiglitazone] Other (See Comments)    Weight gain  . Codeine Itching, Swelling and Other (See Comments)    Burning and heart flutter dizziness    Level of Care/Admitting Diagnosis ED Disposition    ED Disposition Condition [facility] Area: [place] [[id number]]  Level of Care: Telemetry Cardiac [103]  Diagnosis: Unstable angina [facility]) [[id number]]  Admitting Physician: Sueanne Margarita [phone]  Attending Physician: Sueanne Margarita [phone]  Estimated length of stay: past midnight tomorrow  Certification:: I certify this patient will need inpatient services for at least 2 midnights  PT Class (Do Not Modify): Inpatient [101]  PT Acc Code (Do Not Modify): Private [1]       B Medical/Surgery History Past Medical History:  Diagnosis Date  . Chest pain    a. experience weekly, nitrate responsive c/p x several years.   b. ranexa added on 08/2014 admission   . Chronic combined systolic and diastolic CHF (congestive heart failure)  ([place])   . Claudication ([place])    a. 08/2014 ABI: R 0.79, L 1.0-->Walking program instituted.  . Coronary artery disease    a. s/p MI in 1989;  b. 04/2008 CABG x 5 (LIMA->LAD, VG->D1, VG->RI, VG->PDA, L Radial->OM2);  c. 06/2010 PCI/DES to the Diag (2.25x28 Promus Element DES). VG->Diag occluded;  d. 08/2010 Attempted PTCA of the OM2 via the free radial graft. Unsuccessful.  Lesion felt to be 2/2 suture.  c. NSTEMI s/p [facility] with severe native and graph dz; D1 that was stented in 2012 now occulded: Rx therapy recommended  . Coronary artery disease involving native coronary artery of native heart with angina pectoris ([place])    a. s/p MI in 1989;  b. 04/2008 CABG x 5 (LIMA->LAD, VG->D1, VG->RI, VG->PDA, L Radial->OM2);  c. 06/2010 PCI/DES to the Diag (2.25x28 Promus Element DES). VG->Diag occluded;  d. 08/2010 Attempted PTCA of the OM2 via the free radial graft. Unsuccessful.  Lesion felt to be 2/2 suture.  c. NSTEMI s/p [facility] with severe native and graph dz; Rx therapy recommended   . Diabetes mellitus ([place]) 06/02/2011  . Hyperlipidemia   . Hypertension   . NSTEMI (non-ST elevated myocardial infarction) ([place]) 08/29/2014  . Peripheral arterial disease ([place]) 08/16/2014  . Type II diabetes mellitus ([place])    Past Surgical History:  Procedure Laterality Date  . CARDIAC CATHETERIZATION N/A 08/28/2014   Procedure: Left Heart Cath and Cors/Grafts Angiography;  Surgeon: Belva Crome, MD;  Location: [place]  CV LAB;  Service: Cardiovascular;  Laterality: N/A;  . COLONOSCOPY    . CORONARY ANGIOPLASTY WITH STENT PLACEMENT    . CORONARY ARTERY BYPASS GRAFT    . PERIPHERAL VASCULAR CATHETERIZATION N/A 07/15/2015   Procedure: Abdominal Aortogram w/Lower Extremity;  Surgeon: Wellington Hampshire, MD;  Location: [place] CV LAB;  Service: Cardiovascular;  Laterality: N/A;  . PERIPHERAL VASCULAR CATHETERIZATION Bilateral 07/15/2015   Procedure: Peripheral Vascular Intervention;  Surgeon: Wellington Hampshire, MD;  Location: [place] CV LAB;  Service: Cardiovascular;  Laterality: Bilateral;  Common [place]  . SHOULDER ARTHROSCOPY WITH ROTATOR CUFF REPAIR AND SUBACROMIAL DECOMPRESSION Left 05/23/2013   Procedure: LEFT SHOULDER ARTHROSCOPY SUBACROMIAL  DECOMPRESSION DISTAL CLAVICLE RESECTION AND ROTATOR CUFF REPAIR ;  Surgeon: Marin Shutter, MD;  Location: [place];  Service: Orthopedics;  Laterality: Left;  . SHOULDER SURGERY Right 7-8 years ago   Dr. Dorna Leitz     A IV Location/Drains/Wounds Patient Lines/Drains/Airways Status   Active Line/Drains/Airways    Name:   Placement date:   Placement time:   Site:   Days:   Peripheral IV 03/20/18 Left Antecubital   03/20/18    0115    Antecubital   less than 1   Peripheral IV 03/20/18 Right Antecubital   03/20/18    0438    Antecubital   less than 1          Intake/Output Last 24 hours  Intake/Output Summary (Last 24 hours) at 03/20/2018 0538 Last data filed at 03/20/2018 5102 Gross per 24 hour  Intake 9.32 ml  Output 650 ml  Net -640.68 ml    Labs/Imaging Results for orders placed or performed during the hospital encounter of 03/20/18 (from the past 48 hour(s))  CBC     Status: Abnormal   Collection Time: 03/20/18  1:16 AM  Result Value Ref Range   WBC 14.2 (H) 4.0 - 10.5 K/uL   RBC 4.63 4.22 - 5.81 MIL/uL   Hemoglobin 14.3 13.0 - 17.0 g/dL   HCT 41.9 39.0 - 52.0 %   MCV 90.5 80.0 - 100.0 fL   MCH 30.9 26.0 - 34.0 pg   MCHC 34.1 30.0 - 36.0 g/dL   RDW 12.9 11.5 - 15.5 %   Platelets 177 150 - 400 K/uL   nRBC 0.0 0.0 - 0.2 %    Comment: Performed at [facility], [place] [address], [place], [place] [address]  Comprehensive metabolic panel     Status: Abnormal   Collection Time: 03/20/18  1:16 AM  Result Value Ref Range   Sodium 128 (L) 135 - 145 mmol/L   Potassium 4.2 3.5 - 5.1 mmol/L   Chloride 97 (L) 98 - 111 mmol/L   CO2 19 (L) 22 - 32 mmol/L   Glucose, Bld 200 (H) 70 - 99 mg/dL   BUN 14 8 - 23 mg/dL   Creatinine, Ser 1.06 0.61 - 1.24  mg/dL   Calcium 9.6 8.9 - 10.3 mg/dL   Total Protein 7.7 6.5 - 8.1 g/dL   Albumin 4.2 3.5 - 5.0 g/dL   AST 21 15 - 41 U/L   ALT 17 0 - 44 U/L   Alkaline Phosphatase 76 38 - 126 U/L   Total Bilirubin 0.9 0.3 - 1.2 mg/dL   GFR calc non Af Amer >60 >60 mL/min   GFR calc Af Amer >60 >60 mL/min   Anion gap 12 5 - 15    Comment: Performed at [facility],  [address] [place], [place] [address]  Brain natriuretic peptide     Status: Abnormal   Collection Time: 03/20/18  1:16 AM  Result Value Ref Range   B Natriuretic Peptide 376.7 (H) 0.0 - 100.0 pg/mL    Comment: Performed at [facility] [address], [place],  [address]  I-stat troponin, ED     Status: None   Collection Time: 03/20/18  1:33 AM  Result Value Ref Range   Troponin i, poc 0.02 0.00 - 0.08 ng/mL   Comment 3            Comment: Due to the release kinetics of cTnI, a negative result within the first hours of the onset of symptoms does not rule out myocardial infarction with certainty. If myocardial infarction is still suspected, repeat the test at appropriate intervals.    Ct Angio Chest Pe W And/or Wo Contrast  Result Date: 03/20/2018 CLINICAL DATA:  Chest pain and shortness of breath with wheezing EXAM: CT ANGIOGRAPHY CHEST WITH CONTRAST TECHNIQUE: Multidetector CT imaging of the chest was performed using the standard protocol during bolus administration of intravenous contrast. Multiplanar CT image reconstructions and MIPs were obtained to evaluate the vascular anatomy. CONTRAST:  53mL OMNIPAQUE IOHEXOL 350 MG/ML SOLN COMPARISON:  CTA chest 05/22/2008 FINDINGS: Cardiovascular: --Pulmonary arteries: Contrast injection is sufficient to demonstrate satisfactory opacification of the pulmonary arteries to the segmental level. There is no pulmonary embolus. The main pulmonary artery is within normal limits for size. --Aorta: Limited opacification of the aorta due to bolus timing optimization for the  pulmonary arteries. Normal variant aortic arch branching pattern with the left vertebral artery arising independently from the aortic arch. The aortic course and caliber are normal. There is moderate aortic atherosclerosis. --Heart: Normal size. No pericardial effusion. Mediastinum/Nodes: No mediastinal, hilar or axillary lymphadenopathy. The visualized thyroid and thoracic esophageal course are unremarkable. Lungs/Pleura: Small pleural effusions with basilar atelectasis and moderate pulmonary edema, worst in the dependent portions of the lung. Upper Abdomen: Contrast bolus timing is not optimized for evaluation of the abdominal organs. Within this limitation, the visualized organs of the upper abdomen are normal. Musculoskeletal: No chest wall abnormality. No acute or significant osseous findings. Review of the MIP images confirms the above findings. IMPRESSION: 1. No pulmonary embolus. 2. Small pleural effusions with basilar atelectasis and moderate pulmonary edema. 3. Aortic Atherosclerosis (ICD10-I70.0). Electronically Signed   By: Ulyses Jarred M.D.   On: 03/20/2018 05:17   Dg Chest Port 1 View  Result Date: 03/20/2018 CLINICAL DATA:  Pt brought in by [facility] c/o CP 8/10 that came on suddenly while he was laying in bed. Pt has an extensive cardiac hx and has had multiple MIs in the past. Pt has hx of HTN, CHF EXAM: PORTABLE CHEST 1 VIEW COMPARISON:  05/05/2017 FINDINGS: Stable changes from prior CABG surgery. Cardiac silhouette is normal in size. No mediastinal or hilar masses. There are prominent bronchovascular markings with mild interstitial thickening, the latter most evident in the lung bases. No lung consolidation. No convincing pleural effusion and no pneumothorax. Skeletal structures are grossly intact. IMPRESSION: 1. Prominent bronchovascular markings and mild interstitial thickening, representing a change from the prior exam. Findings consistent with pulmonary edema. Electronically Signed   By:  Lajean Manes M.D.   On: 03/20/2018 01:39    Pending Labs Unresulted Labs (From admission, onward)    Start     Ordered   03/21/18 0500  Heparin level (unfractionated)  Daily,  R     03/20/18 0132   03/21/18 0500  CBC  Daily,   R     03/20/18 0132   03/20/18 0800  Heparin level (unfractionated)  Once-Timed,   R     03/20/18 0132   Signed and Held  HIV antibody (Routine Testing)  Once,   R     Signed and Held   Signed and Held  Comprehensive metabolic panel  Once,   R     Signed and Held   Signed and Held  Magnesium  Once,   R     Signed and Held   Signed and Held  TSH  Once,   R     Signed and Held   Signed and Held  Troponin I - Now Then Q6H  Now then every 6 hours,   STAT     Signed and Held   Signed and Held  Hemoglobin A1c  Once,   R     Signed and Held   Signed and Held  CBC WITH DIFFERENTIAL  Once,   R     Signed and Held   Signed and Held  Protime-INR  ONCE - STAT,   STAT     Signed and Held   Signed and Held  APTT  ONCE - STAT,   STAT     Signed and Held   Signed and Held  Occult blood card to lab, stool  Daily,   R     Signed and Held   Signed and Held  Basic metabolic panel  Tomorrow morning,   R     Signed and Held   Signed and Held  Lipid panel  Tomorrow morning,   R     Signed and Held          Vitals/Pain Today's Vitals   03/20/18 0507 03/20/18 0513 03/20/18 0515 03/20/18 0530  BP: 124/76  129/82 125/86  Pulse: (!) 108  98 100  Resp: (!) 23  (!) 27 (!) 25  Temp:      TempSrc:      SpO2: 96%  96% 95%  Weight:      Height:      PainSc:  0-No pain      Isolation Precautions No active isolations  Medications Medications  nitroGLYCERIN 50 mg in dextrose 5 % 250 mL (0.2 mg/mL) infusion (25 mcg/min Intravenous Rate/Dose Change 03/20/18 0317)  heparin ADULT infusion 100 units/mL (25000 units/221mL sodium chloride 0.45%) (1,000 Units/hr Intravenous New Bag/Given 03/20/18 0141)  furosemide (LASIX) injection 40 mg (has no administration in time range)   sodium chloride flush (NS) 0.9 % injection 3 mL (3 mLs Intravenous Given 03/20/18 0116)  heparin bolus via infusion 4,000 Units (4,000 Units Intravenous Bolus from Bag 03/20/18 0143)  furosemide (LASIX) injection 40 mg (40 mg Intravenous Given 03/20/18 0303)  iohexol (OMNIPAQUE) 350 MG/ML injection 80 mL (80 mLs Intravenous Contrast Given 03/20/18 0444)    Mobility walks Low fall risk   Focused Assessments Cardiac Assessment Handoff:  Cardiac Rhythm: Normal sinus rhythm Lab Results  Component Value Date   CKTOTAL 89 06/02/2011   CKMB 2.0 06/02/2011   TROPONINI 0.38 (H) 08/28/2014   Lab Results  Component Value Date   DDIMER 0.41 08/04/2010   Does the Patient currently have chest pain? No  , Pulmonary Assessment Handoff:  Lung sounds: Bilateral Breath Sounds: Clear, Diminished L Breath Sounds: Clear, Diminished R Breath Sounds: Clear, Diminished O2 Device: NRB O2 Flow Rate (L/min): 15  L/min      R Recommendations: See Admitting Provider Note  Report given to:   Additional Notes:  Pt hypoxic on RA and up to 5Lpm on [place]. Not on any home O2. Presently on NRB at 15Lpm, and SpO2 maintaining >92%. Chest pain/L arm pain resolution with nitro drip.

## 2018-03-20 NOTE — Progress Notes (Signed)
ANTICOAGULATION CONSULT NOTE - [place] for Heparin Indication: chest pain/ACS  Allergies  Allergen Reactions  . Aldactone [Spironolactone] Nausea And Vomiting  . Percocet [Oxycodone-Acetaminophen] Other (See Comments)    "nightmares, stomach problems, and foggy brain"  . Avandia [Rosiglitazone] Other (See Comments)    Weight gain  . Codeine Itching, Swelling and Other (See Comments)    Burning and heart flutter dizziness    Patient Measurements: Height: 5\' 7"  (170.2 cm) Weight: 170 lb (77.1 kg) IBW/kg (Calculated) : 66.1  Vital Signs: Temp: 98.3 F (36.8 C) (03/17 0807) Temp Source: Oral (03/17 0807) BP: 141/89 (03/17 0807) Pulse Rate: 93 (03/17 0715)  Labs: Recent Labs    03/20/18 0116 03/20/18 0844 03/20/18 1001  HGB 14.3  --  14.0  HCT 41.9  --  40.3  PLT 177  --  184  HEPARINUNFRC <0.10* 0.26*  --   CREATININE 1.06  --   --     Estimated Creatinine Clearance: 61.5 mL/min (by C-G formula based on SCr of 1.06 mg/dL).   Medical History: Past Medical History:  Diagnosis Date  . Chest pain    a. experience weekly, nitrate responsive c/p x several years.   b. ranexa added on 08/2014 admission   . Chronic combined systolic and diastolic CHF (congestive heart failure) ([place])   . Claudication ([place])    a. 08/2014 ABI: R 0.79, L 1.0-->Walking program instituted.  . Coronary artery disease    a. s/p MI in 1989;  b. 04/2008 CABG x 5 (LIMA->LAD, VG->D1, VG->RI, VG->PDA, L Radial->OM2);  c. 06/2010 PCI/DES to the Diag (2.25x28 Promus Element DES). VG->Diag occluded;  d. 08/2010 Attempted PTCA of the OM2 via the free radial graft. Unsuccessful.  Lesion felt to be 2/2 suture.  c. NSTEMI s/p [facility] with severe native and graph dz; D1 that was stented in 2012 now occulded: Rx therapy recommended  . Coronary artery disease involving native coronary artery of native heart with angina pectoris ([place])    a. s/p MI in 1989;  b. 04/2008 CABG x 5 (LIMA->LAD, VG->D1,  VG->RI, VG->PDA, L Radial->OM2);  c. 06/2010 PCI/DES to the Diag (2.25x28 Promus Element DES). VG->Diag occluded;  d. 08/2010 Attempted PTCA of the OM2 via the free radial graft. Unsuccessful.  Lesion felt to be 2/2 suture.  c. NSTEMI s/p [facility] with severe native and graph dz; Rx therapy recommended   . Diabetes mellitus ([place]) 06/02/2011  . Hyperlipidemia   . Hypertension   . NSTEMI (non-ST elevated myocardial infarction) ([place]) 08/29/2014  . Peripheral arterial disease ([place]) 08/16/2014  . Type II diabetes mellitus ([facility])     Medications:  See electronic med rec  Assessment: 49 yoM with hx CAD presents with CP and acute CHF. Pt on IV heparin, initial level slightly subtherapeutic. CBC stable, cards planning ECHO.  Goal of Therapy:  Heparin level 0.3-0.7 units/ml Monitor platelets by anticoagulation protocol: Yes   Plan:  -Increase heparin to 1200 units/hr -Check 6hr heparin level   Arrie Senate, PharmD, BCPS Clinical Pharmacist [phone] Please check AMION for all [place] numbers 03/20/2018

## 2018-03-20 NOTE — Progress Notes (Signed)
Pt returned from Cath Lab ,  Made comfortable in bed,care continues.

## 2018-03-20 NOTE — Progress Notes (Signed)
ANTICOAGULATION CONSULT NOTE - Initial Consult  Pharmacy Consult for Heparin Indication: chest pain/ACS  Allergies  Allergen Reactions  . Aldactone [Spironolactone] Nausea And Vomiting  . Percocet [Oxycodone-Acetaminophen] Other (See Comments)    "nightmares, stomach problems, and foggy brain"  . Avandia [Rosiglitazone] Other (See Comments)    Weight gain  . Codeine Itching, Swelling and Other (See Comments)    Burning and heart flutter dizziness    Patient Measurements: Height: 5\' 7"  (170.2 cm) Weight: 170 lb (77.1 kg) IBW/kg (Calculated) : 66.1  Vital Signs:    Labs: No results for input(s): HGB, HCT, PLT, APTT, LABPROT, INR, HEPARINUNFRC, HEPRLOWMOCWT, CREATININE, CKTOTAL, CKMB, TROPONINI in the last 72 hours.  CrCl cannot be calculated (Patient's most recent lab result is older than the maximum 21 days allowed.).   Medical History: Past Medical History:  Diagnosis Date  . Chest pain    a. experience weekly, nitrate responsive c/p x several years.   b. ranexa added on 08/2014 admission   . Chronic combined systolic and diastolic CHF (congestive heart failure) ([place])   . Claudication ([place])    a. 08/2014 ABI: R 0.79, L 1.0-->Walking program instituted.  . Coronary artery disease    a. s/p MI in 1989;  b. 04/2008 CABG x 5 (LIMA->LAD, VG->D1, VG->RI, VG->PDA, L Radial->OM2);  c. 06/2010 PCI/DES to the Diag (2.25x28 Promus Element DES). VG->Diag occluded;  d. 08/2010 Attempted PTCA of the OM2 via the free radial graft. Unsuccessful.  Lesion felt to be 2/2 suture.  c. NSTEMI s/p [facility] with severe native and graph dz; D1 that was stented in 2012 now occulded: Rx therapy recommended  . Coronary artery disease involving native coronary artery of native heart with angina pectoris ([place])    a. s/p MI in 1989;  b. 04/2008 CABG x 5 (LIMA->LAD, VG->D1, VG->RI, VG->PDA, L Radial->OM2);  c. 06/2010 PCI/DES to the Diag (2.25x28 Promus Element DES). VG->Diag occluded;  d. 08/2010 Attempted PTCA of the  OM2 via the free radial graft. Unsuccessful.  Lesion felt to be 2/2 suture.  c. NSTEMI s/p [facility] with severe native and graph dz; Rx therapy recommended   . Diabetes mellitus ([place]) 06/02/2011  . Hyperlipidemia   . Hypertension   . NSTEMI (non-ST elevated myocardial infarction) ([place]) 08/29/2014  . Peripheral arterial disease ([place]) 08/16/2014  . Type II diabetes mellitus ([facility])     Medications:  See electronic med rec  Assessment: 68 y.o. M presents with CP. To begin heparin for r/o ACS. No AC PTA.  Goal of Therapy:  Heparin level 0.3-0.7 units/ml Monitor platelets by anticoagulation protocol: Yes   Plan:  Heparin IV bolus 4000 units Heparin gtt at 1000 units/hr Will f/u heparin level in 6 hours Daily heparin level and CBC  Sherlon Handing, PharmD, BCPS Clinical pharmacist  **Pharmacist phone directory can now be found on [email or web address] ([email or web address]).  Listed under [place]. 03/20/2018,1:28 AM

## 2018-03-20 NOTE — Progress Notes (Signed)
  Echocardiogram 2D Echocardiogram has been performed.  [name] [name] 03/20/2018, 11:44 AM

## 2018-03-20 NOTE — Progress Notes (Signed)
Pt left for cardiac cath.

## 2018-03-20 NOTE — Progress Notes (Signed)
  RN notified me regarding 5 beat run of NSVT on tele. Pt asymptomatic. Mg 1.4. Will give supplemental magnesium sulfate 2 gm IV now. F/u Mg and BMP in the am. Continue to monitor.

## 2018-03-20 NOTE — ED Provider Notes (Addendum)
[facility]ERGENCY DEPARTMENT Provider Note  CSN: [id number] Arrival date & time: 03/20/18 0055  Chief Complaint(s) Chest Pain  HPI [name] [name] is a 68 y.o. male with extensive cardiac history including prior MIs status post CABG x5 and numerous stents.  Last catheterization was 2016 revealing complete occlusion of 2 of the 5 grafts.  He presents today with 1 hour of sudden onset left shoulder and neck pressure.  Similar but more intense than prior heart attacks.  Patient is endorsing some shortness of breath and feels like he is wheezing.  Pain improved after nitroglycerin by EMS.  States that it is starting to come back and got worse again.  He denies any recent fevers or infections.  Endorses dry cough.  Some nausea.  Positive diaphoresis.  No abdominal pain or diarrhea.  Denies any other physical complaints.  HPI  Past Medical History Past Medical History:  Diagnosis Date   Chest pain    a. experience weekly, nitrate responsive c/p x several years.   b. ranexa added on 08/2014 admission    Chronic combined systolic and diastolic CHF (congestive heart failure) ([place])    Claudication ([place])    a. 08/2014 ABI: R 0.79, L 1.0-->Walking program instituted.   Coronary artery disease    a. s/p MI in 1989;  b. 04/2008 CABG x 5 (LIMA->LAD, VG->D1, VG->RI, VG->PDA, L Radial->OM2);  c. 06/2010 PCI/DES to the Diag (2.25x28 Promus Element DES). VG->Diag occluded;  d. 08/2010 Attempted PTCA of the OM2 via the free radial graft. Unsuccessful.  Lesion felt to be 2/2 suture.  c. NSTEMI s/p [facility] with severe native and graph dz; D1 that was stented in 2012 now occulded: Rx therapy recommended   Coronary artery disease involving native coronary artery of native heart with angina pectoris ([place])    a. s/p MI in 1989;  b. 04/2008 CABG x 5 (LIMA->LAD, VG->D1, VG->RI, VG->PDA, L Radial->OM2);  c. 06/2010 PCI/DES to the Diag (2.25x28 Promus Element DES). VG->Diag occluded;  d. 08/2010 Attempted PTCA  of the OM2 via the free radial graft. Unsuccessful.  Lesion felt to be 2/2 suture.  c. NSTEMI s/p [facility] with severe native and graph dz; Rx therapy recommended    Diabetes mellitus ([place]) 06/02/2011   Hyperlipidemia    Hypertension    NSTEMI (non-ST elevated myocardial infarction) ([place]) 08/29/2014   Peripheral arterial disease ([place]) 08/16/2014   Type II diabetes mellitus [facility])    Patient Active Problem List   Diagnosis Date Noted   Unstable angina ([place]) 03/20/2018   Snoring 06/10/2015   NSTEMI (non-ST elevated myocardial infarction) ([place]) 08/29/2014   Coronary artery disease involving native coronary artery of native heart with angina pectoris (HCC)    Chronic combined systolic and diastolic CHF (congestive heart failure) ([place])    Peripheral arterial disease ([place]) 08/16/2014   Diabetes mellitus ([place]) 06/02/2011   Hypertension 01/06/2011   Hyperlipidemia 01/06/2011   Home Medication(s) Prior to Admission medications   Medication Sig Start Date End Date Taking? Authorizing Provider  acetaminophen (TYLENOL) 500 MG tablet Take 500 mg by mouth daily as needed for mild pain.    [provider]  ALPRAZolam Duanne Moron) 0.25 MG tablet Take 0.25 mg by mouth 3 (three) times daily as needed for anxiety.     [provider]  amLODipine (NORVASC) 10 MG tablet Take 10 mg by mouth daily. 02/17/17   [provider]  Aromatic Inhalants (VICKS VAPOR INHALER IN) Inhale into the lungs daily as needed (for  wheezing).     [provider]  aspirin 81 MG tablet Take 81 mg by mouth daily.     [provider]  atorvastatin (LIPITOR) 40 MG tablet Take 40 mg by mouth daily.    [provider]  atorvastatin (LIPITOR) 80 MG tablet TAKE 1 TABLET BY MOUTH  DAILY AT 6 PM. 11/27/17   Wellington Hampshire, MD  carvedilol (COREG) 12.5 MG tablet Take 1 tablet (12.5 mg total) by mouth 2 (two) times daily. 02/22/17 02/17/18  Belva Crome, MD  carvedilol (COREG) 6.25 MG  tablet TAKE 1 TABLET BY MOUTH TWO  TIMES DAILY 03/19/18   Belva Crome, MD  cholecalciferol (VITAMIN D) 1000 UNITS tablet Take 1,000 Units by mouth daily.     [provider]  clopidogrel (PLAVIX) 75 MG tablet Take 75 mg by mouth daily with breakfast.    [provider]  desoximetasone (TOPICORT) 0.25 % cream Apply 1 application topically once a week.     [provider]  ezetimibe (ZETIA) 10 MG tablet TAKE 1 TABLET BY MOUTH  DAILY 06/06/17   Wellington Hampshire, MD  fluticasone (FLONASE) 50 MCG/ACT nasal spray Place 2 sprays into the nose daily. For nasal congestion    [provider]  isosorbide mononitrate (IMDUR) 120 MG 24 hr tablet Take 120 mg by mouth daily. Take with Imdur 30 mg daily to equal 150 mg total daily.    [provider]  isosorbide mononitrate (IMDUR) 30 MG 24 hr tablet TAKE 1 TABLET BY MOUTH ONCE DAILY 01/25/18   Wellington Hampshire, MD  metFORMIN (GLUCOPHAGE) 1000 MG tablet Take 1,000 mg by mouth 2 (two) times daily with a meal.    [provider]  nitroGLYCERIN (NITROSTAT) 0.4 MG SL tablet Place 0.4 mg under the tongue every 5 (five) minutes x 3 doses as needed. For chest pain    [provider]  Polyvinyl Alcohol-Povidone (CLEAR EYES ALL SEASONS OP) Place 1 drop into both eyes daily as needed (for dry eyes).    [provider]  quinapril (ACCUPRIL) 20 MG tablet Take 20 mg by mouth at bedtime.    [provider]  quinapril (ACCUPRIL) 40 MG tablet Take 1 tablet (40 mg total) by mouth every morning. 11/14/17   Belva Crome, MD  ranolazine (RANEXA) 500 MG 12 hr tablet TAKE 1 TABLET BY MOUTH TWO  TIMES DAILY 12/08/17   Belva Crome, MD  sitaGLIPtin (JANUVIA) 100 MG tablet Take 100 mg by mouth daily.     [provider]                                                                                                                                    Past Surgical History Past Surgical History:    Procedure Laterality Date   CARDIAC CATHETERIZATION N/A 08/28/2014   Procedure: Left Heart Cath and Cors/Grafts Angiography;  Surgeon:  Belva Crome, MD;  Location: [facility] CV LAB;  Service: Cardiovascular;  Laterality: N/A;   COLONOSCOPY     CORONARY ANGIOPLASTY WITH STENT PLACEMENT     CORONARY ARTERY BYPASS GRAFT     PERIPHERAL VASCULAR CATHETERIZATION N/A 07/15/2015   Procedure: Abdominal Aortogram w/Lower Extremity;  Surgeon: Wellington Hampshire, MD;  Location: [facility] CV [facility];  Service: Cardiovascular;  Laterality: N/A;   PERIPHERAL VASCULAR CATHETERIZATION Bilateral 07/15/2015   Procedure: Peripheral Vascular Intervention;  Surgeon: Wellington Hampshire, MD;  Location: [facility];  Service: Cardiovascular;  Laterality: Bilateral;  Common Iliacs   SHOULDER ARTHROSCOPY WITH ROTATOR CUFF REPAIR AND SUBACROMIAL DECOMPRESSION Left 05/23/2013   Procedure: LEFT SHOULDER ARTHROSCOPY SUBACROMIAL  DECOMPRESSION DISTAL CLAVICLE RESECTION AND ROTATOR CUFF REPAIR ;  Surgeon: Marin Shutter, MD;  Location: [facility];  Service: Orthopedics;  Laterality: Left;   SHOULDER SURGERY Right 7-8 years ago   Dr. Dorna Leitz   Family History Family History  Problem Relation Age of Onset   Heart Problems Brother        heart valve replacement   Anesthesia problems Brother    Heart disease Mother    Lung cancer Mother    Heart failure Mother    Heart attack Father    Heart Problems Sister        had heart valve replacement   Hypertension Brother    Diabetes Brother    Kidney failure Brother    Heart Problems Brother     Social History Social History   Tobacco Use   Smoking status: Former Smoker    Last attempt to quit: 01/04/1987    Years since quitting: 31.2   Smokeless tobacco: Former Systems developer  Substance Use Topics   Alcohol use: Yes    Alcohol/week: 2.0 standard drinks    Types: 2 Cans of beer per week    Comment: a week   Drug use: No   Allergies Aldactone  [spironolactone]; Percocet [oxycodone-acetaminophen]; Avandia [rosiglitazone]; and Codeine  Review of Systems Review of Systems All other systems are reviewed and are negative for acute change except as noted in the HPI  Physical Exam Vital Signs  I have reviewed the triage vital signs BP 135/78    Pulse (!) 105    Temp 97.9 F (36.6 C) (Oral)    Resp (!) 27    Ht 5\' 7"  (1.702 m)    Wt 77.1 kg    SpO2 93%    BMI 26.63 kg/m   Physical Exam Vitals signs reviewed.  Constitutional:      General: He is not in acute distress.    Appearance: He is well-developed. He is diaphoretic.  HENT:     Head: Normocephalic and atraumatic.     Nose: Nose normal.  Eyes:     General: No scleral icterus.       Right eye: No discharge.        Left eye: No discharge.     Conjunctiva/sclera: Conjunctivae normal.     Pupils: Pupils are equal, round, and reactive to light.  Neck:     Musculoskeletal: Normal range of motion and neck supple.  Cardiovascular:     Rate and Rhythm: Normal rate and regular rhythm.     Heart sounds: No murmur. No friction rub. No gallop.   Pulmonary:     Effort: Pulmonary effort is normal. Tachypnea present. No respiratory distress.     Breath sounds: No stridor. Examination of the right-lower field  reveals rales. Examination of the left-lower field reveals rales. Rales present.  Abdominal:     General: There is no distension.     Palpations: Abdomen is soft.     Tenderness: There is no abdominal tenderness.  Musculoskeletal:        General: No tenderness.     Comments: 2+ bilateral lower extremity edema  Skin:    General: Skin is warm.     Findings: No erythema or rash.  Neurological:     Mental Status: He is alert and oriented to person, place, and time.     ED Results and Treatments Labs (all labs ordered are listed, but only abnormal results are displayed) Labs Reviewed  CBC - Abnormal; Notable for the following components:      Result Value   WBC 14.2 (*)     All other components within normal limits  COMPREHENSIVE METABOLIC PANEL - Abnormal; Notable for the following components:   Sodium 128 (*)    Chloride 97 (*)    CO2 19 (*)    Glucose, Bld 200 (*)    All other components within normal limits  BRAIN NATRIURETIC PEPTIDE - Abnormal; Notable for the following components:   B Natriuretic Peptide 376.7 (*)    All other components within normal limits  HEPARIN LEVEL (UNFRACTIONATED)  I-STAT TROPONIN, ED                                                                                                                         EKG  EKG Interpretation  Date/Time:  Tuesday March 20 2018 01:03:18 EDT Ventricular Rate:  106 PR Interval:    QRS Duration: 114 QT Interval:  322 QTC Calculation: 428 R Axis:   -13 Text Interpretation:  Sinus tachycardia Borderline intraventricular conduction delay Repol abnrm, severe global ischemia (LM/MVD) Confirmed by Addison Lank [phone]) on 03/20/2018 1:55:27 AM      Radiology Ct Angio Chest Pe W And/or Wo Contrast  Result Date: 03/20/2018 CLINICAL DATA:  Chest pain and shortness of breath with wheezing EXAM: CT ANGIOGRAPHY CHEST WITH CONTRAST TECHNIQUE: Multidetector CT imaging of the chest was performed using the standard protocol during bolus administration of intravenous contrast. Multiplanar CT image reconstructions and MIPs were obtained to evaluate the vascular anatomy. CONTRAST:  95mL OMNIPAQUE IOHEXOL 350 MG/ML SOLN COMPARISON:  CTA chest 05/22/2008 FINDINGS: Cardiovascular: --Pulmonary arteries: Contrast injection is sufficient to demonstrate satisfactory opacification of the pulmonary arteries to the segmental level. There is no pulmonary embolus. The main pulmonary artery is within normal limits for size. --Aorta: Limited opacification of the aorta due to bolus timing optimization for the pulmonary arteries. Normal variant aortic arch branching pattern with the left vertebral artery arising independently  from the aortic arch. The aortic course and caliber are normal. There is moderate aortic atherosclerosis. --Heart: Normal size. No pericardial effusion. Mediastinum/Nodes: No mediastinal, hilar or axillary lymphadenopathy. The visualized thyroid and thoracic esophageal course are unremarkable. Lungs/Pleura: Small pleural effusions with basilar atelectasis  and moderate pulmonary edema, worst in the dependent portions of the lung. Upper Abdomen: Contrast bolus timing is not optimized for evaluation of the abdominal organs. Within this limitation, the visualized organs of the upper abdomen are normal. Musculoskeletal: No chest wall abnormality. No acute or significant osseous findings. Review of the MIP images confirms the above findings. IMPRESSION: 1. No pulmonary embolus. 2. Small pleural effusions with basilar atelectasis and moderate pulmonary edema. 3. Aortic Atherosclerosis (ICD10-I70.0). Electronically Signed   By: Ulyses Jarred M.D.   On: 03/20/2018 05:17   Dg Chest Port 1 View  Result Date: 03/20/2018 CLINICAL DATA:  Pt brought in by [facility] EMS c/o CP 8/10 that came on suddenly while he was laying in bed. Pt has an extensive cardiac hx and has had multiple MIs in the past. Pt has hx of HTN, CHF EXAM: PORTABLE CHEST 1 VIEW COMPARISON:  05/05/2017 FINDINGS: Stable changes from prior CABG surgery. Cardiac silhouette is normal in size. No mediastinal or hilar masses. There are prominent bronchovascular markings with mild interstitial thickening, the latter most evident in the lung bases. No lung consolidation. No convincing pleural effusion and no pneumothorax. Skeletal structures are grossly intact. IMPRESSION: 1. Prominent bronchovascular markings and mild interstitial thickening, representing a change from the prior exam. Findings consistent with pulmonary edema. Electronically Signed   By: Lajean Manes M.D.   On: 03/20/2018 01:39   Pertinent labs & imaging results that were available during my care of  the patient were reviewed by me and considered in my medical decision making (see chart for details).  Medications Ordered in ED Medications  nitroGLYCERIN 50 mg in dextrose 5 % 250 mL (0.2 mg/mL) infusion (25 mcg/min Intravenous Rate/Dose Change 03/20/18 0317)  heparin ADULT infusion 100 units/mL (25000 units/222mL sodium chloride 0.45%) (1,000 Units/hr Intravenous New Bag/Given 03/20/18 0141)  sodium chloride flush (NS) 0.9 % injection 3 mL (3 mLs Intravenous Given 03/20/18 0116)  heparin bolus via infusion 4,000 Units (4,000 Units Intravenous Bolus from Bag 03/20/18 0143)  furosemide (LASIX) injection 40 mg (40 mg Intravenous Given 03/20/18 0303)  iohexol (OMNIPAQUE) 350 MG/ML injection 80 mL (80 mLs Intravenous Contrast Given 03/20/18 0444)                                                                                                                                    Procedures .Critical Care Performed by: Fatima Blank, MD Authorized by: Fatima Blank, MD     CRITICAL CARE Performed by: Grayce Sessions Naythen Heikkila Total critical care time: 75 minutes Critical care time was exclusive of separately billable procedures and treating other patients. Critical care was necessary to treat or prevent imminent or life-threatening deterioration. Critical care was time spent personally by me on the following activities: development of treatment plan with patient and/or surrogate as well as nursing, discussions with consultants, evaluation of patient's response to treatment, examination of patient, obtaining history from patient  or surrogate, ordering and performing treatments and interventions, ordering and review of laboratory studies, ordering and review of radiographic studies, pulse oximetry and re-evaluation of patient's condition.   (including critical care time)  Medical Decision Making / ED Course I have reviewed the nursing notes for this encounter and the patient's prior  records (if available in EHR or on provided paperwork).    EKG with ST segment elevation in aVR and diffuse ST segment depression.  ST depressions seen on previous EKG in February 2019, more prominent now.  Concerning for left main occlusion vs global demand ischemia.  Will discuss with STEMI cardiologist, Dr. Gwenlyn Found whom did not believe this was a left main STEMI.  Patient placed on nitro drip and heparin drip.  Pain and shortness of breath significantly improved after nitroglycerin drip.  Concerning for cardiac etiology either NSTEMI versus flash pulmonary edema versus CHF exacerbation.  Initial troponin negative.  Labs with leukocytosis with stable hemoglobin.  Patient has mild hyponatremia and hypochloremia.  Mild hyperglycemia without evidence of DKA.  No renal insufficiency.  BNP mildly elevated > 300.   Patient and family denied any recent infectious symptoms.  I have low suspicion for pulmonary embolism at this time.  Case discussed with Dr. Radford Pax from cardiology who agreed to evaluate the patient in the emergency department for likely admission.  Cardiology requested CTA, which was negative for PE or PNA. Showed diffuse edema.  Final Clinical Impression(s) / ED Diagnoses Final diagnoses:  Chest pain  NSTEMI (non-ST elevated myocardial infarction) ([place])  Acute pulmonary edema ([place])      This chart was dictated using voice recognition software.  Despite best efforts to proofread,  errors can occur which can change the documentation meaning.     Fatima Blank, MD 03/20/18 [phone]

## 2018-03-20 NOTE — ED Triage Notes (Signed)
Pt brought in by [facility] EMS c/o CP 8/10 that came on suddenly while he was laying in bed. Pt has an extensive cardiac hx and has had multiple MIs in the past. Pt took 2 ASA and 2 sublingual nitro prior to EMS arrival to his house. EMS gave 6 more of sublingual nitro and placed a 1 1/2 inch nitro paste. Vitals prior to arrival: BP 147-76, HR 90, SpO2 894% RA, CBG 207.

## 2018-03-20 NOTE — ED Notes (Signed)
Patient transported to CT 

## 2018-03-20 NOTE — Progress Notes (Addendum)
Site area: rt groin fa sheath Site Prior to Removal:  Level 0 Pressure Applied For: 20 minutes Manual:   yes Patient Status During Pull:  stable Post Pull Site:  Level 0 Post Pull Instructions Given:  yes Post Pull Pulses Present: rt dp palpble Dressing Applied:  gauze and tegaderm Bedrest begins @ 1540 Comments:

## 2018-03-20 NOTE — ED Notes (Signed)
ED Provider at bedside. 

## 2018-03-20 NOTE — Progress Notes (Signed)
CRITICAL VALUE ALERT  Critical Value:  17.04  Date & Time Notied:  3014  Provider Notified: Pa Simmons  Orders Received/Actions taken: rept ecg, cardiac cath today.

## 2018-03-20 NOTE — Progress Notes (Addendum)
  Pt seen earlier this morning by Dr. Radford Pax. H&P complete. This is an interval rounding progress note.   Brief Patient Profile: This is a 68yo male with an extensive hx of ASCAD, well outlined in H&P earlier today, as well as h/o chronic combined systolic and diastolic heart failure (EF at cath 2016 was 40-50%), hypertension, hyperlipidemia, and type 2 diabetes, admitted for unstable angina and acute on chronic CHF.   Subjective: feeling a bit better after getting lasix but continues with dyspnea. Denies any current CP. No fever or chills.   Objective: BNP 376. CXR w/ findings c/w pulmonary edema. Chest CT negative for PE but also c/w moderate pulmonary edema. Good UOP after IV Lasix. -1.2L out but still hypoxic requiring high flow supplemental O2. O2 sats 91% on supplemental O2. Serial CEs pending.  Admit EKG showed marked ST depression c/w ischemia and ST elevation in aVR but not diagnostic for acute STEMI.  A&Ox3 WM in no acute distress. Lung exam w/ bilateral wheezing. Cardiac exam RRR, no murmurs, trace bilateral pretibial edema. 5 beat run of NSVT on tele. Currently NSR. BP 141/89.   Assessment: acute on chronic combined systolic and diastolic CHF and recent CP c/w [place], although currently CP free. ? Component of bronchitis as well.   Plan:   1) A/C Combined Systolic and Diastolic CHF: continue IV diuretics, supplemental O2, strict I/Os, daily weights, close monitoring of renal function, electrolytes (hyponatremia), monitor for hypokalemia. Low salt diet. Repeat 2D echo pending.    2) Unstable Angina/Known CAD: currently CP free. Initial troponin negative. Continue to cycle troponin's Q6H x 3. Continue IV heparin. Echo ordered. Possible cardiac cath this admit. Continue medical therapy. ASA, Plavix, statin, zetia, Ranexa, IV nitro.  blocker on hold for acute CHF.    3) NSVT: 5 beat run noted on tele. Keep electrolytes stable. Repeat echo.  blocker currently on hold for acute CHF. Monitor.    4. Hyponatremia: Na 128. Likely secondary to volume overload. Mentating well. A&Ox 3. Monitor closely.   MD to follow with further recommendations.   History and all data above reviewed.  Patient examined.  I agree with the findings as above. NQWMI.  Breathing better and since he is breathing better his chest pain is improved.    The patient exam reveals COR:RRR  ,  Lungs: Decreased BS with crackles at the bases  ,  Abd: Positive bowel sounds, no rebound no guarding, Ext No edema  .  All available labs, radiology testing, previous records reviewed. Agree with documented assessment and plan.   NQWMI:  Plan cardiac cath today.    Jeneen Rinks Briant Angelillo  9:03 PM  03/20/2018

## 2018-03-20 NOTE — ED Notes (Signed)
SpO2 85% on 4Lpm via [place]. Incr'd to 5Lpm with no change noted. MD informed and ordered pt to be placed on NRB. Pt on 15Lpm via NRB at this time.

## 2018-03-20 NOTE — H&P (Addendum)
Admit date: 03/20/2018 Referring Physician:  Dr. Leonette Monarch Primary Cardiologist Dr. Daneen Schick Chief complaint/reason for admission: Chest pain and CHF  HPI: [name] [name] is a 68 y.o. male who is being seen today for the evaluation of chest pain and CHF at the request of Dr. Addison Lank.  This is a 68yo male with an extensive hx of ASCAD.  He is s/p remote CABG x 5 (LIMA->LAD, VG->D1, VG->RI, VG->PDA, L Radial->OM2) and percutaneous coronary interventions (06/2010 PCI/DES to the Diag (2.25x28 Promus Element DES) with VG->Diag occluded. 08/2010 Attempted PTCA of the OM2 via the free radial graft. Unsuccessful.  Lesion felt to be 2/2 suture.  Then NSTEMI s/p [facility] with severe native and graph dz; D1 that was stented in 2012 now occulded: Rx therapy recommended).  His last cath was in 2016 showing 50% distal LM, 80% ostial LAD, occluded LCx, 99% RI, occluded mid RCA and occluded stented D1.  The SVG to RI and SVG to Diag were occluded and free radial to OM, SVG to PDA and LIMA to LAD were patent.  There was a 90% stenosis the OM past the insertion of the free radial graft and not amenable to PCI.  Medical management was recommended.  EF at that time was 40-50% by cath.    He also has  chronic combined systolic and diastolic heart failure (EF at cath 2016 was 40-50%), hypertension, hyperlipidemia, and type 2 diabetes.  He was in his USOH until tonight when he started having SOB around 10:30pm.  He was getting ready for bed when it started.  He then developed CP that he described as a pressure across his chest with some radiation into his neck.  He called EMS and was given 6 SL NTG and nitropaste with improvement in CP.  He says that the CP is not really similar to prior angina and that his lungs feel "raw" when he breaths.  He had some wheezing and was hypoxic on arrival.  Cxray showed CHF and BNP elevated at 300.  He denies any recent fever, chills or exposure to anyone ill and no recent travel.  His pain  started to come back in ER.  He was given Lasix 40mg  IV and started on IV Heparin gtt and ntg gtt.  EKG showed marked ischemic ST depression throughout the inferior and precordial leads.  Dr. Gwenlyn Found, the interventionalist, was called due to ST elevation in aVR but felt he was not a STEMI.  Currently he complains of no CP.      PMH:   Past Medical History:  Diagnosis Date  . Chest pain    a. experience weekly, nitrate responsive c/p x several years.   b. ranexa added on 08/2014 admission   . Chronic combined systolic and diastolic CHF (congestive heart failure) ([place])   . Claudication ([place])    a. 08/2014 ABI: R 0.79, L 1.0-->Walking program instituted.  . Coronary artery disease    a. s/p MI in 1989;  b. 04/2008 CABG x 5 (LIMA->LAD, VG->D1, VG->RI, VG->PDA, L Radial->OM2);  c. 06/2010 PCI/DES to the Diag (2.25x28 Promus Element DES). VG->Diag occluded;  d. 08/2010 Attempted PTCA of the OM2 via the free radial graft. Unsuccessful.  Lesion felt to be 2/2 suture.  c. NSTEMI s/p [facility] with severe native and graph dz; D1 that was stented in 2012 now occulded: Rx therapy recommended  . Coronary artery disease involving native coronary artery of native heart with angina pectoris ([place])    a. s/p MI  in 1989;  b. 04/2008 CABG x 5 (LIMA->LAD, VG->D1, VG->RI, VG->PDA, L Radial->OM2);  c. 06/2010 PCI/DES to the Diag (2.25x28 Promus Element DES). VG->Diag occluded;  d. 08/2010 Attempted PTCA of the OM2 via the free radial graft. Unsuccessful.  Lesion felt to be 2/2 suture.  c. NSTEMI s/p [facility] with severe native and graph dz; Rx therapy recommended   . Diabetes mellitus ([place]) 06/02/2011  . Hyperlipidemia   . Hypertension   . NSTEMI (non-ST elevated myocardial infarction) ([place]) 08/29/2014  . Peripheral arterial disease ([place]) 08/16/2014  . Type II diabetes mellitus ([facility])     PSH:    Past Surgical History:  Procedure Laterality Date  . CARDIAC CATHETERIZATION N/A 08/28/2014   Procedure: Left Heart Cath and Cors/Grafts  Angiography;  Surgeon: Belva Crome, MD;  Location: [place] [facility];  Service: Cardiovascular;  Laterality: N/A;  . COLONOSCOPY    . CORONARY ANGIOPLASTY WITH STENT PLACEMENT    . CORONARY ARTERY BYPASS GRAFT    . PERIPHERAL VASCULAR CATHETERIZATION N/A 07/15/2015   Procedure: Abdominal Aortogram w/Lower Extremity;  Surgeon: Wellington Hampshire, MD;  Location: [place] [facility];  Service: Cardiovascular;  Laterality: N/A;  . PERIPHERAL VASCULAR CATHETERIZATION Bilateral 07/15/2015   Procedure: Peripheral Vascular Intervention;  Surgeon: Wellington Hampshire, MD;  Location: [place] [facility];  Service: Cardiovascular;  Laterality: Bilateral;  Common Iliacs  . SHOULDER ARTHROSCOPY WITH ROTATOR CUFF REPAIR AND SUBACROMIAL DECOMPRESSION Left 05/23/2013   Procedure: LEFT SHOULDER ARTHROSCOPY SUBACROMIAL  DECOMPRESSION DISTAL CLAVICLE RESECTION AND ROTATOR CUFF REPAIR ;  Surgeon: Marin Shutter, MD;  Location: [facility];  Service: Orthopedics;  Laterality: Left;  . SHOULDER SURGERY Right 7-8 years ago   Dr. Dorna Leitz    ALLERGIES:   Aldactone [spironolactone]; Percocet [oxycodone-acetaminophen]; Avandia [rosiglitazone]; and Codeine  Prior to Admit Meds:  (Not in a hospital admission)  Family HX:    Family History  Problem Relation Age of Onset  . Heart Problems Brother        heart valve replacement  . Anesthesia problems Brother   . Heart disease Mother   . Lung cancer Mother   . Heart failure Mother   . Heart attack Father   . Heart Problems Sister        had heart valve replacement  . Hypertension Brother   . Diabetes Brother   . Kidney failure Brother   . Heart Problems Brother    Social HX:    Social History   Socioeconomic History  . Marital status: Married    Spouse name: Not on file  . Number of children: Not on file  . Years of education: Not on file  . Highest education level: Not on file  Occupational History  . Not on file  Social Needs  . Financial resource strain: Not  on file  . Food insecurity:    Worry: Not on file    Inability: Not on file  . Transportation needs:    Medical: Not on file    Non-medical: Not on file  Tobacco Use  . Smoking status: Former Smoker    Last attempt to quit: 01/04/1987    Years since quitting: 31.2  . Smokeless tobacco: Former Network engineer and Sexual Activity  . Alcohol use: Yes    Alcohol/week: 2.0 standard drinks    Types: 2 Cans of beer per week    Comment: a week  . Drug use: No  . Sexual activity: Not on file  Lifestyle  . Physical activity:    Days per week: Not on file    Minutes per session: Not on file  . Stress: Not on file  Relationships  . Social connections:    Talks on phone: Not on file    Gets together: Not on file    Attends religious service: Not on file    Active member of club or organization: Not on file    Attends meetings of clubs or organizations: Not on file    Relationship status: Not on file  . Intimate partner violence:    Fear of current or ex partner: Not on file    Emotionally abused: Not on file    Physically abused: Not on file    Forced sexual activity: Not on file  Other Topics Concern  . Not on file  Social History Narrative   Lives in [place] with his wife.  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     ROS:  All ROS were addressed and are negative except what is stated in the HPI  PHYSICAL EXAM Vitals:   03/20/18 0311 03/20/18 0315  BP:  (!) 141/83  Pulse: 99 98  Resp: (!) 23 (!) 26  Temp:    SpO2: 95% 95%   General: Well developed, well nourished, in no acute distress Head: Eyes PERRLA, No xanthomas.   Normal cephalic and atramatic  Lungs:   Crackles at bases and decreased BS Heart:   HRRR S1 S2 Pulses are 2+ & equal.            No carotid bruit. No JVD.  No abdominal bruits. No femoral bruits. Abdomen: Bowel sounds are positive, abdomen soft and non-tender without masses or                  Hernia's noted. Msk:  Back normal, normal gait. Normal strength and  tone for age. Extremities:   2+ pitting LLE edema.  DP +1 Neuro: Alert and oriented X 3. Psych:  Good affect, responds appropriately   Labs:   Lab Results  Component Value Date   WBC 14.2 (H) 03/20/2018   HGB 14.3 03/20/2018   HCT 41.9 03/20/2018   MCV 90.5 03/20/2018   PLT 177 03/20/2018    Recent Labs  Lab 03/20/18 0116  NA 128*  K 4.2  CL 97*  CO2 19*  BUN 14  CREATININE 1.06  CALCIUM 9.6  PROT 7.7  BILITOT 0.9  ALKPHOS 76  ALT 17  AST 21  GLUCOSE 200*   Lab Results  Component Value Date   CKTOTAL 89 06/02/2011   CKMB 2.0 06/02/2011   TROPONINI 0.38 (H) 08/28/2014   No results found for: PTT Lab Results  Component Value Date   INR 1.1 07/01/2015   INR 1.10 08/28/2014   INR 1.01 05/20/2013     Lab Results  Component Value Date   CHOL 160 08/28/2014   CHOL 130 06/24/2010   Lab Results  Component Value Date   HDL 39 (L) 08/28/2014   HDL 44 06/24/2010   Lab Results  Component Value Date   LDLCALC 99 08/28/2014   LDLCALC 68 06/24/2010   Lab Results  Component Value Date   TRIG 108 08/28/2014   TRIG 92 06/24/2010   Lab Results  Component Value Date   CHOLHDL 4.1 08/28/2014   CHOLHDL 3.0 06/24/2010   No results found for: LDLDIRECT    Radiology:  Dg Chest Port 1 View  Result Date: 03/20/2018  CLINICAL DATA:  Pt brought in [facility] c/o CP 8/10 that came on suddenly while he was laying in bed. Pt has an extensive cardiac hx and has had multiple MIs in the past. Pt has hx of HTN, CHF EXAM: PORTABLE CHEST 1 VIEW COMPARISON:  05/05/2017 FINDINGS: Stable changes from prior CABG surgery. Cardiac silhouette is normal in size. No mediastinal or hilar masses. There are prominent bronchovascular markings with mild interstitial thickening, the latter most evident in the lung bases. No lung consolidation. No convincing pleural effusion and no pneumothorax. Skeletal structures are grossly intact. IMPRESSION: 1. Prominent bronchovascular markings and mild  interstitial thickening, representing a change from the prior exam. Findings consistent with pulmonary edema. Electronically Signed   By: Lajean Manes M.D.   On: 03/20/2018 01:39     Telemetry    NSR - Personally Reviewed  ECG    NSR with marked ST depression in the precordial leads - Personally Reviewed   ASSESSMENT/PLAN: 1.  Unstable angina pectoris -initial trop normal -has mild CP at present -EKG with marked ST depression c/w ischemia and ST elevation in aVR - reviewed with Interventionalist who felt not STEMI -continue IV heparin gtt, ASA, statin, Ranexa -holding BB in setting of acute CHF decompensation -Start IV NTG gtt -cycle troponin -NPO for now -possible cath in am  2.  ASCAD -extensive CAD hx with remote CABG x 5 with cath in 2016 showing showing 50% distal LM, 80% ostial LAD, occluded LCx, 99% RI, occluded mid RCA and occluded stented D1.  The SVG to RI and SVG to Diag were occluded and free radial to OM, SVG to PDA and LIMA to LAD were patent.  There was a 90% stenosis the OM past the insertion of the free radial graft and not amenable to PCI.  Medical management was recommended.  -he has had chronic angina and has been on Imdur and Ranexa -continue ASA, Plavix, Ranexa, BB and statin. -long acting nitrate changed to IV NTG gtt  3.  Acute on chronic combined systolic/diastolic CHF -Cxray c/w CHF and BNP elevated at 376 -he has already received Lasix 40mg  IV and diuresed well with improvement in sx -start Lasix  40mg  IV BID  -follow daily weights and strict I&O's while diuresing -follow renal function closely -low Na likely secondary to volume overload -continue ACE I -holding BB with acute decompensation  4.  HTN -BP currently stable -continue amlodipine 10mg  daily and Quinapril 40mg  daily -hold carvedilol in setting of acute CHF decompensation  5.  Hyperlipidemia -LDL goal < 70 -continue Lipitor 80mg  daily and Zetia 10mg  daily -check FLP in am  6.   Type II Dm -BS elevated at 200 -check HbA1C in am -hold Metformin -BS checks qac and hs with SS Insulin coverage  7.  Leukocytosis -likely related to stress demargination -no recent fever, chills, body aches -no recent exposure to sick people or recent travel -Will repeat CBC in am with differential  8.  Unilateral left lower leg edema -he has had swelling in his left leg which is noticeably larger than the right -need to rule out DVT -LE venous dopplers in am -he is on IV Heparin -Given significant hypoxia out of proportion to BNP elevation Chest CTA done with no PE and evidence of b/l pleural effusions and CHF  9.  Hyponatremia -likely dilutional from CHF -continue to follow with diuresis    Fransico Him, MD  03/20/2018  3:25 AM

## 2018-03-20 NOTE — Interval H&P Note (Signed)
History and Physical Interval Note:  03/20/2018 2:29 PM  [name] [name]  has presented today for surgery, with the diagnosis of Angina.  The various methods of treatment have been discussed with the patient and family. After consideration of risks, benefits and other options for treatment, the patient has consented to  Procedure(s): LEFT HEART CATH AND CORS/GRAFTS ANGIOGRAPHY (N/A) as a surgical intervention.  The patient's history has been reviewed, patient examined, no change in status, stable for surgery.  I have reviewed the patient's chart and labs.  Questions were answered to the patient's satisfaction.    Cath Lab Visit (complete for each Cath Lab visit)  Clinical Evaluation Leading to the Procedure:   ACS: Yes.    Non-ACS:    Anginal Classification: CCS IV  Anti-ischemic medical therapy: Maximal Therapy (2 or more classes of medications)  Non-Invasive Test Results: No non-invasive testing performed  Prior CABG: Previous CABG       [name] [name] [facility] 03/20/2018 2:29 PM

## 2018-03-21 ENCOUNTER — Encounter (HOSPITAL_COMMUNITY): Payer: Self-pay | Admitting: Cardiology

## 2018-03-21 LAB — BASIC METABOLIC PANEL
Anion gap: 10 (ref 5–15)
BUN: 13 mg/dL (ref 8–23)
CO2: 23 mmol/L (ref 22–32)
Calcium: 9 mg/dL (ref 8.9–10.3)
Chloride: 98 mmol/L (ref 98–111)
Creatinine, Ser: 1.1 mg/dL (ref 0.61–1.24)
GFR calc Af Amer: >60 mL/min (ref >60)
GFR calc non Af Amer: >60 mL/min (ref >60)
Glucose, Bld: 157 mg/dL — ABNORMAL HIGH (ref 70–99)
Potassium: 3.2 mmol/L — ABNORMAL LOW (ref 3.5–5.1)
Sodium: 131 mmol/L — ABNORMAL LOW (ref 135–145)

## 2018-03-21 LAB — CBC
HCT: 37.9 % — ABNORMAL LOW (ref 39.0–52.0)
Hemoglobin: 13.2 g/dL (ref 13.0–17.0)
MCH: 30.5 pg (ref 26.0–34.0)
MCHC: 34.8 g/dL (ref 30.0–36.0)
MCV: 87.5 fL (ref 80.0–100.0)
NRBC: 0 % (ref 0.0–0.2)
Platelets: 143 10*3/uL — ABNORMAL LOW (ref 150–400)
RBC: 4.33 MIL/uL (ref 4.22–5.81)
RDW: 12.7 % (ref 11.5–15.5)
WBC: 11.1 10*3/uL — ABNORMAL HIGH (ref 4.0–10.5)

## 2018-03-21 LAB — GLUCOSE, CAPILLARY
GLUCOSE-CAPILLARY: 181 mg/dL — AB (ref 70–99)
Glucose-Capillary: 157 mg/dL — ABNORMAL HIGH (ref 70–99)
Glucose-Capillary: 185 mg/dL — ABNORMAL HIGH (ref 70–99)
Glucose-Capillary: 189 mg/dL — ABNORMAL HIGH (ref 70–99)

## 2018-03-21 LAB — HIV ANTIBODY (ROUTINE TESTING W REFLEX): HIV Screen 4th Generation wRfx: NONREACTIVE

## 2018-03-21 LAB — MAGNESIUM: Magnesium: 1.4 mg/dL — ABNORMAL LOW (ref 1.7–2.4)

## 2018-03-21 MED ORDER — POTASSIUM CHLORIDE CRYS ER 20 MEQ PO TBCR
20.0000 meq | EXTENDED_RELEASE_TABLET | Freq: Every day | ORAL | Status: DC
  Administered 2018-03-21: 20 meq via ORAL
  Filled 2018-03-21 (×2): qty 1

## 2018-03-21 MED ORDER — CARVEDILOL 6.25 MG PO TABS
6.2500 mg | ORAL_TABLET | Freq: Two times a day (BID) | ORAL | Status: DC
  Administered 2018-03-21 – 2018-03-24 (×6): 6.25 mg via ORAL
  Filled 2018-03-21 (×6): qty 1

## 2018-03-21 MED ORDER — AZELASTINE HCL 0.1 % NA SOLN
1.0000 | Freq: Two times a day (BID) | NASAL | Status: DC
  Administered 2018-03-21 – 2018-03-24 (×7): 1 via NASAL
  Filled 2018-03-21: qty 30

## 2018-03-21 MED ORDER — POTASSIUM CHLORIDE CRYS ER 20 MEQ PO TBCR: 20.0000 meq | EXTENDED_RELEASE_TABLET | Freq: Every day | ORAL | Status: DC

## 2018-03-21 MED ORDER — ISOSORBIDE MONONITRATE ER 30 MG PO TB24
30.0000 mg | ORAL_TABLET | Freq: Every day | ORAL | Status: DC
  Administered 2018-03-21 – 2018-03-24 (×4): 30 mg via ORAL
  Filled 2018-03-21 (×4): qty 1

## 2018-03-21 MED ORDER — POTASSIUM CHLORIDE CRYS ER 20 MEQ PO TBCR
40.0000 meq | EXTENDED_RELEASE_TABLET | Freq: Once | ORAL | Status: AC
  Administered 2018-03-21: 40 meq via ORAL
  Filled 2018-03-21: qty 2

## 2018-03-21 MED ORDER — SACUBITRIL-VALSARTAN 24-26 MG PO TABS
1.0000 | ORAL_TABLET | Freq: Two times a day (BID) | ORAL | Status: DC
  Administered 2018-03-21 – 2018-03-24 (×6): 1 via ORAL
  Filled 2018-03-21 (×6): qty 1

## 2018-03-21 MED ORDER — AMLODIPINE BESYLATE 5 MG PO TABS
5.0000 mg | ORAL_TABLET | Freq: Every day | ORAL | Status: DC
  Administered 2018-03-21 – 2018-03-23 (×3): 5 mg via ORAL
  Filled 2018-03-21 (×3): qty 1

## 2018-03-21 MED ORDER — FLUTICASONE PROPIONATE 50 MCG/ACT NA SUSP
2.0000 | Freq: Every day | NASAL | Status: DC
  Administered 2018-03-21 – 2018-03-24 (×4): 2 via NASAL
  Filled 2018-03-21: qty 16

## 2018-03-21 NOTE — Progress Notes (Signed)
Progress Note  Patient Name: [name] [name] Date of Encounter: 03/21/2018  Primary Cardiologist: Sinclair Grooms, MD   Subjective   Still complains of chest congestion. Some cough. Has chronic nasal congestion for which he uses Flonase and Azelastine. No chest pain. Edema is better.   Inpatient Medications    Scheduled Meds: . amLODipine  5 mg Oral Daily  . aspirin EC  81 mg Oral Daily  . atorvastatin  80 mg Oral q1800  . azelastine  1 spray Each Nare BID  . carvedilol  6.25 mg Oral BID WC  . clopidogrel  75 mg Oral Q breakfast  . ezetimibe  10 mg Oral Daily  . fluticasone  2 spray Each Nare Daily  . furosemide  40 mg Intravenous BID  . insulin aspart  0-15 Units Subcutaneous TID WC  . [START ON 03/22/2018] potassium chloride  20 mEq Oral Daily  . potassium chloride  40 mEq Oral Once  . ranolazine  500 mg Oral BID  . sacubitril-valsartan  1 tablet Oral BID  . sodium chloride flush  3 mL Intravenous Q12H   Continuous Infusions: . sodium chloride     PRN Meds: sodium chloride, ALPRAZolam, nitroGLYCERIN, ondansetron (ZOFRAN) IV, sodium chloride flush   Vital Signs    Vitals:   03/21/18 0400 03/21/18 0500 03/21/18 0758 03/21/18 0832  BP: 111/69  120/79 126/77  Pulse: 82   97  Resp: (!) 21   (!) 24  Temp: 98.6 F (37 C)  98 F (36.7 C)   TempSrc: Oral  Oral Oral  SpO2: 96%   96%  Weight:  78.7 kg    Height:  5' 6.5" (1.689 m)      Intake/Output Summary (Last 24 hours) at 03/21/2018 0847 Last data filed at 03/21/2018 0300 Gross per 24 hour  Intake 115.51 ml  Output 2485 ml  Net -2369.49 ml   Last 3 Weights 03/21/2018 03/20/2018 08/21/2017  Weight (lbs) 173 lb 8 oz 170 lb 171 lb 6.4 oz  Weight (kg) 78.7 kg 77.111 kg 77.747 kg      Telemetry    NSR. One PVC triplet - Personally Reviewed  ECG    NSR with lateral ST depression - Personally Reviewed  Physical Exam   GEN: No acute distress. On NRB  Neck: No JVD Cardiac: RRR, no murmurs, rubs, or gallops.   Respiratory: bilateral coarse rhonchi/rales GI: Soft, nontender, non-distended, right groin without hematoma MS: No edema; No deformity. Neuro:  Nonfocal  Psych: Normal affect   Labs    Chemistry Recent Labs  Lab 03/20/18 0116 03/20/18 1001 03/20/18 1626 03/21/18 0205  NA 128* 130*  --  131*  K 4.2 3.9  --  3.2*  CL 97* 100  --  98  CO2 19* 18*  --  23  GLUCOSE 200* 214*  --  157*  BUN 14 15  --  13  CREATININE 1.06 1.15 1.04 1.10  CALCIUM 9.6 9.1  --  9.0  PROT 7.7 7.6  --   --   ALBUMIN 4.2 3.8  --   --   AST 21 83*  --   --   ALT 17 24  --   --   ALKPHOS 76 73  --   --   BILITOT 0.9 1.2  --   --   GFRNONAA >60 >60 >60 >60  GFRAA >60 >60 >60 >60  ANIONGAP 12 12  --  10     Hematology Recent Labs  Lab 03/20/18 1001 03/20/18 1626 03/21/18 0205  WBC 11.5* 11.8* 11.1*  RBC 4.53 4.66 4.33  HGB 14.0 14.0 13.2  HCT 40.3 41.4 37.9*  MCV 89.0 88.8 87.5  MCH 30.9 30.0 30.5  MCHC 34.7 33.8 34.8  RDW 12.9 12.8 12.7  PLT 184 174 143*    Cardiac Enzymes Recent Labs  Lab 03/20/18 1001 03/20/18 1626 03/20/18 2129  TROPONINI 17.04* 23.93* 25.22*    Recent Labs  Lab 03/20/18 0133  TROPIPOC 0.02     BNP Recent Labs  Lab 03/20/18 0116  BNP 376.7*     DDimer No results for input(s): DDIMER in the last 168 hours.   Radiology    Ct Angio Chest Pe W And/or Wo Contrast  Result Date: 03/20/2018 CLINICAL DATA:  Chest pain and shortness of breath with wheezing EXAM: CT ANGIOGRAPHY CHEST WITH CONTRAST TECHNIQUE: Multidetector CT imaging of the chest was performed using the standard protocol during bolus administration of intravenous contrast. Multiplanar CT image reconstructions and MIPs were obtained to evaluate the vascular anatomy. CONTRAST:  73mL OMNIPAQUE IOHEXOL 350 MG/ML SOLN COMPARISON:  CTA chest 05/22/2008 FINDINGS: Cardiovascular: --Pulmonary arteries: Contrast injection is sufficient to demonstrate satisfactory opacification of the pulmonary arteries to  the segmental level. There is no pulmonary embolus. The main pulmonary artery is within normal limits for size. --Aorta: Limited opacification of the aorta due to bolus timing optimization for the pulmonary arteries. Normal variant aortic arch branching pattern with the left vertebral artery arising independently from the aortic arch. The aortic course and caliber are normal. There is moderate aortic atherosclerosis. --Heart: Normal size. No pericardial effusion. Mediastinum/Nodes: No mediastinal, hilar or axillary lymphadenopathy. The visualized thyroid and thoracic esophageal course are unremarkable. Lungs/Pleura: Small pleural effusions with basilar atelectasis and moderate pulmonary edema, worst in the dependent portions of the lung. Upper Abdomen: Contrast bolus timing is not optimized for evaluation of the abdominal organs. Within this limitation, the visualized organs of the upper abdomen are normal. Musculoskeletal: No chest wall abnormality. No acute or significant osseous findings. Review of the MIP images confirms the above findings. IMPRESSION: 1. No pulmonary embolus. 2. Small pleural effusions with basilar atelectasis and moderate pulmonary edema. 3. Aortic Atherosclerosis (ICD10-I70.0). Electronically Signed   By: Ulyses Jarred M.D.   On: 03/20/2018 05:17   Dg Chest Port 1 View  Result Date: 03/20/2018 CLINICAL DATA:  Pt brought in by [facility] c/o CP 8/10 that came on suddenly while he was laying in bed. Pt has an extensive cardiac hx and has had multiple MIs in the past. Pt has hx of HTN, CHF EXAM: PORTABLE CHEST 1 VIEW COMPARISON:  05/05/2017 FINDINGS: Stable changes from prior CABG surgery. Cardiac silhouette is normal in size. No mediastinal or hilar masses. There are prominent bronchovascular markings with mild interstitial thickening, the latter most evident in the lung bases. No lung consolidation. No convincing pleural effusion and no pneumothorax. Skeletal structures are grossly intact.  IMPRESSION: 1. Prominent bronchovascular markings and mild interstitial thickening, representing a change from the prior exam. Findings consistent with pulmonary edema. Electronically Signed   By: Lajean Manes M.D.   On: 03/20/2018 01:39    Cardiac Studies   Echo 03/20/18: IMPRESSIONS    1. The left ventricle has severely reduced systolic function, with an ejection fraction of 25-30%. The cavity size was normal. There is mildly increased left ventricular wall thickness. Left ventricular diastolic Doppler parameters are consistent with  impaired relaxation. Indeterminate filling pressures The E/e' is 8-15.  There is incoordinate septal motion. Left ventricular diffuse hypokinesis.  2. The right ventricle has low normal systolic function. The cavity was mildly enlarged. There is no increase in right ventricular wall thickness.  3. The pericardium was not assessed.  4. The mitral valve is degenerative. Mild thickening of the mitral valve leaflet.  5. The aortic valve is tricuspid Mild calcification of the aortic valve.  6. The aortic root and ascending aorta are normal in size and structure.  7. The inferior vena cava was dilated in size with <50% respiratory variability.  8. The interatrial septum was not well visualized.  Cardiac cath 03/20/18: Procedures   LEFT HEART CATH AND CORS/GRAFTS ANGIOGRAPHY  Conclusion     Prox RCA to Dist RCA lesion is 100% stenosed.  Ost LM to Ost LAD lesion is 100% stenosed with 100% stenosed side branch in Ost Cx.  Left radial artery graft was visualized by angiography and is normal in caliber.  1st Mrg lesion is 90% stenosed.  Ost 1st Mrg to 1st Mrg lesion is 100% stenosed.  Ost Ramus to Ramus lesion is 100% stenosed.  Ost 1st Diag lesion is 100% stenosed.  Ost LAD to Prox LAD lesion is 100% stenosed.  SVG graft was visualized by angiography and is normal in caliber.  The graft exhibits mild .  LIMA graft was visualized by angiography and  is normal in caliber.  The graft exhibits no disease.  LV end diastolic pressure is mildly elevated.  Origin lesion is 100% stenosed.   1. Native vessel occlusive CAD involving the left main Coronary and RCA 2. Patent LIMA to the LAD 3. Patent free radial artery graft to the OM1. There is severe stenosis in the native vessel distal to the graft that is chronic 4. Patent SVG to PDA 5. Occluded SVG to diagonal 6. Elevated LVEDP 25 mm Hg.   Plan: compared to prior study in 2016 the left main is now occluded. Also the SVG to OM1 no longer supplies retrograde flow to the distal LCx. Graft status is unchanged.  No targets for revascularization. The OM distal to the SVG is too small for PCI. Optimize medical management.        Patient Profile     68 y.o. male with an extensive hx of ASCAD, as well as h/o chronic combined systolic and diastolic heart failure (EF at cath 2016 was 40-50%), hypertension, hyperlipidemia, and type 2 diabetes, admitted for unstable angina and acute on chronic CHF.  Assessment & Plan    1. NSTEMI. S/p remote CABG and multiple PCIs. Significant lateral ST depression on admission. Troponin peak 25.22. EF has dropped significantly. On Cath grafts to LAD, PDA and OM are still patent. What has changed is that the native left main is now occluded. Also the radial graft to the OM no longer has retrograde filling of the distal LCx. The OM itself is severely diseased distal to the graft insertion but this is chronic and not amenable to PCI due to small vessel size. No targets for intervention. Will manage medically. Has been on Coreg, Imdur, Norvasc and Ranexa as well as DAPT. Will stop IV Ntg and resume Imdur at lower dose. Resume Coreg at lower dose. Will reduce Norvasc dose to allow ample BP for titrating CHF meds. He currently has no angina and has more CHF symptoms 2. Acute on chronic combined systolic/diastolic CHF. EF has dropped from 40-50% to 25-30%. Diuresing well  with IV lasix. I/O negative 2400 cc yesterday. Will  continue IV lasix today. Resume Coreg at lower dose. Would recommend switching Accupril to [facility]. He has not had ACEi for 36 hours now so can start Entresto this evening. Consider adding aldactone prior to discharge. Discussed need for sodium and fluid restriction. 3. Hypokalemia and hypomagnesemia - will replete 4. HTN controlled 5. HLD. Goal LDL < 70. On high dose lipitor and Zetia.  6. DM type 2. Metformin on hold post cath. Was on Januvia pre hospital. Now on SSI. May want to consider an SGLT2 inhibitor.  7. Hyponatremia related to CHF.   For questions or updates, please contact [place] Please consult [email or web address] for contact info under        Signed, Alexander Mcauley [place], MD  03/21/2018, 8:47 AM

## 2018-03-22 ENCOUNTER — Inpatient Hospital Stay (HOSPITAL_COMMUNITY): Payer: Medicare Other

## 2018-03-22 DIAGNOSIS — I214 Non-ST elevation (NSTEMI) myocardial infarction: Principal | ICD-10-CM

## 2018-03-22 DIAGNOSIS — I739 Peripheral vascular disease, unspecified: Secondary | ICD-10-CM

## 2018-03-22 DIAGNOSIS — I25710 Atherosclerosis of autologous vein coronary artery bypass graft(s) with unstable angina pectoris: Secondary | ICD-10-CM

## 2018-03-22 LAB — GLUCOSE, CAPILLARY
GLUCOSE-CAPILLARY: 224 mg/dL — AB (ref 70–99)
Glucose-Capillary: 144 mg/dL — ABNORMAL HIGH (ref 70–99)
Glucose-Capillary: 296 mg/dL — ABNORMAL HIGH (ref 70–99)
Glucose-Capillary: 168 mg/dL — ABNORMAL HIGH (ref 70–99)

## 2018-03-22 LAB — BASIC METABOLIC PANEL
Anion gap: 14 (ref 5–15)
BUN: 17 mg/dL (ref 8–23)
CHLORIDE: 94 mmol/L — AB (ref 98–111)
CO2: 21 mmol/L — ABNORMAL LOW (ref 22–32)
Calcium: 8.6 mg/dL — ABNORMAL LOW (ref 8.9–10.3)
Creatinine, Ser: 1.13 mg/dL (ref 0.61–1.24)
GFR calc Af Amer: >60 mL/min (ref >60)
GFR calc non Af Amer: >60 mL/min (ref >60)
Glucose, Bld: 177 mg/dL — ABNORMAL HIGH (ref 70–99)
Potassium: 3.3 mmol/L — ABNORMAL LOW (ref 3.5–5.1)
Sodium: 129 mmol/L — ABNORMAL LOW (ref 135–145)

## 2018-03-22 MED ORDER — POTASSIUM CHLORIDE CRYS ER 20 MEQ PO TBCR
40.0000 meq | EXTENDED_RELEASE_TABLET | Freq: Every day | ORAL | Status: DC
  Administered 2018-03-22 (×2): 40 meq via ORAL
  Filled 2018-03-22: qty 2

## 2018-03-22 MED ORDER — FUROSEMIDE 10 MG/ML IJ SOLN
80.0000 mg | Freq: Two times a day (BID) | INTRAMUSCULAR | Status: DC
  Administered 2018-03-22 – 2018-03-24 (×4): 80 mg via INTRAVENOUS
  Filled 2018-03-22 (×4): qty 8

## 2018-03-22 MED ORDER — POTASSIUM CHLORIDE CRYS ER 20 MEQ PO TBCR
40.0000 meq | EXTENDED_RELEASE_TABLET | ORAL | Status: AC
  Administered 2018-03-22 (×2): 40 meq via ORAL
  Filled 2018-03-22 (×2): qty 2

## 2018-03-22 NOTE — Progress Notes (Signed)
VASCULAR LAB PRELIMINARY  PRELIMINARY  PRELIMINARY  PRELIMINARY  ABIs completed.    Preliminary report:  See CV proc for results  Dejohn Ibarra, RVT 03/22/2018, 3:44 PM

## 2018-03-22 NOTE — Progress Notes (Addendum)
Progress Note  Patient Name: [name] [name] Date of Encounter: 03/22/2018  Primary Cardiologist: Sinclair Grooms, MD   Subjective   Productive cough but he has not seen sputum. No fevers.  On nasal cannula, says O2 sats drop and he feels poorly. No chest pain.  Inpatient Medications    Scheduled Meds:  amLODipine  5 mg Oral Daily   aspirin EC  81 mg Oral Daily   atorvastatin  80 mg Oral q1800   azelastine  1 spray Each Nare BID   carvedilol  6.25 mg Oral BID WC   clopidogrel  75 mg Oral Q breakfast   ezetimibe  10 mg Oral Daily   fluticasone  2 spray Each Nare Daily   furosemide  40 mg Intravenous BID   insulin aspart  0-15 Units Subcutaneous TID WC   isosorbide mononitrate  30 mg Oral Daily   potassium chloride  20 mEq Oral Daily   ranolazine  500 mg Oral BID   sacubitril-valsartan  1 tablet Oral BID   sodium chloride flush  3 mL Intravenous Q12H   Continuous Infusions:  sodium chloride     PRN Meds: sodium chloride, ALPRAZolam, nitroGLYCERIN, ondansetron (ZOFRAN) IV, sodium chloride flush   Vital Signs    Vitals:   03/22/18 0345 03/22/18 0541 03/22/18 0700 03/22/18 0732  BP: 110/63  120/72 120/72  Pulse: 85  87 90  Resp: 20   (!) 21  Temp: 98.3 F (36.8 C)   97.8 F (36.6 C)  TempSrc: Oral   Axillary  SpO2: 95%   96%  Weight:  79 kg    Height:        Intake/Output Summary (Last 24 hours) at 03/22/2018 0834 Last data filed at 03/22/2018 0734 Gross per 24 hour  Intake 1083 ml  Output 1140 ml  Net -57 ml   Last 3 Weights 03/22/2018 03/21/2018 03/20/2018  Weight (lbs) 174 lb 2.6 oz 173 lb 8 oz 170 lb  Weight (kg) 79 kg 78.7 kg 77.111 kg      Telemetry    SR, no ectopy- Personally Reviewed  ECG    03/18 SR, w/ lateral ST depression - Personally Reviewed  Physical Exam   General: Well developed, well nourished, male in no acute distress. NRB 4 lpm Head: Eyes PERRLA, No xanthomas.   Normocephalic and atraumatic Lungs: R>L  rales. Heart: HRRR S1 S2, without RG. 2/6 SEM  Pulses are 2+ & equal. JVD 9 cm. Abdomen: Bowel sounds are present, abdomen soft and non-tender without masses or  hernias noted. Msk: Normal strength and tone for age. Extremities: No clubbing, cyanosis or edema.    Skin:  No rashes or lesions noted. Neuro: Alert and oriented X 3. Psych:  Good affect, responds appropriately   Labs    Chemistry Recent Labs  Lab 03/20/18 0116 03/20/18 1001 03/20/18 1626 03/21/18 0205 03/22/18 0228  NA 128* 130*  --  131* 129*  K 4.2 3.9  --  3.2* 3.3*  CL 97* 100  --  98 94*  CO2 19* 18*  --  23 21*  GLUCOSE 200* 214*  --  157* 177*  BUN 14 15  --  13 17  CREATININE 1.06 1.15 1.04 1.10 1.13  CALCIUM 9.6 9.1  --  9.0 8.6*  PROT 7.7 7.6  --   --   --   ALBUMIN 4.2 3.8  --   --   --   AST 21 83*  --   --   --  ALT 17 24  --   --   --   ALKPHOS 76 73  --   --   --   BILITOT 0.9 1.2  --   --   --   GFRNONAA >60 >60 >60 >60 >60  GFRAA >60 >60 >60 >60 >60  ANIONGAP 12 12  --  10 14     Hematology Recent Labs  Lab 03/20/18 1001 03/20/18 1626 03/21/18 0205  WBC 11.5* 11.8* 11.1*  RBC 4.53 4.66 4.33  HGB 14.0 14.0 13.2  HCT 40.3 41.4 37.9*  MCV 89.0 88.8 87.5  MCH 30.9 30.0 30.5  MCHC 34.7 33.8 34.8  RDW 12.9 12.8 12.7  PLT 184 174 143*    Cardiac Enzymes Recent Labs  Lab 03/20/18 1001 03/20/18 1626 03/20/18 2129  TROPONINI 17.04* 23.93* 25.22*    Recent Labs  Lab 03/20/18 0133  TROPIPOC 0.02     BNP Recent Labs  Lab 03/20/18 0116  BNP 376.7*     Radiology    Ct Angio Chest Pe W And/or Wo Contrast  Result Date: 03/20/2018 CLINICAL DATA:  Chest pain and shortness of breath with wheezing EXAM: CT ANGIOGRAPHY CHEST WITH CONTRAST TECHNIQUE: Multidetector CT imaging of the chest was performed using the standard protocol during bolus administration of intravenous contrast. Multiplanar CT image reconstructions and MIPs were obtained to evaluate the vascular anatomy.  CONTRAST:  44mL OMNIPAQUE IOHEXOL 350 MG/ML SOLN COMPARISON:  CTA chest 05/22/2008 FINDINGS: Cardiovascular: --Pulmonary arteries: Contrast injection is sufficient to demonstrate satisfactory opacification of the pulmonary arteries to the segmental level. There is no pulmonary embolus. The main pulmonary artery is within normal limits for size. --Aorta: Limited opacification of the aorta due to bolus timing optimization for the pulmonary arteries. Normal variant aortic arch branching pattern with the left vertebral artery arising independently from the aortic arch. The aortic course and caliber are normal. There is moderate aortic atherosclerosis. --Heart: Normal size. No pericardial effusion. Mediastinum/Nodes: No mediastinal, hilar or axillary lymphadenopathy. The visualized thyroid and thoracic esophageal course are unremarkable. Lungs/Pleura: Small pleural effusions with basilar atelectasis and moderate pulmonary edema, worst in the dependent portions of the lung. Upper Abdomen: Contrast bolus timing is not optimized for evaluation of the abdominal organs. Within this limitation, the visualized organs of the upper abdomen are normal. Musculoskeletal: No chest wall abnormality. No acute or significant osseous findings. Review of the MIP images confirms the above findings. IMPRESSION: 1. No pulmonary embolus. 2. Small pleural effusions with basilar atelectasis and moderate pulmonary edema. 3. Aortic Atherosclerosis (ICD10-I70.0). Electronically Signed   By: Ulyses Jarred M.D.   On: 03/20/2018 05:17   Dg Chest Port 1 View  Result Date: 03/20/2018 CLINICAL DATA:  Pt brought in by [facility] c/o CP 8/10 that came on suddenly while he was laying in bed. Pt has an extensive cardiac hx and has had multiple MIs in the past. Pt has hx of HTN, CHF EXAM: PORTABLE CHEST 1 VIEW COMPARISON:  05/05/2017 FINDINGS: Stable changes from prior CABG surgery. Cardiac silhouette is normal in size. No mediastinal or hilar masses.  There are prominent bronchovascular markings with mild interstitial thickening, the latter most evident in the lung bases. No lung consolidation. No convincing pleural effusion and no pneumothorax. Skeletal structures are grossly intact. IMPRESSION: 1. Prominent bronchovascular markings and mild interstitial thickening, representing a change from the prior exam. Findings consistent with pulmonary edema. Electronically Signed   By: Lajean Manes M.D.   On: 03/20/2018 01:39  Cardiac Studies   Echo 03/20/18: IMPRESSIONS    1. The left ventricle has severely reduced systolic function, with an ejection fraction of 25-30%. The cavity size was normal. There is mildly increased left ventricular wall thickness. Left ventricular diastolic Doppler parameters are consistent with  impaired relaxation. Indeterminate filling pressures The E/e' is 8-15. There is incoordinate septal motion. Left ventricular diffuse hypokinesis.  2. The right ventricle has low normal systolic function. The cavity was mildly enlarged. There is no increase in right ventricular wall thickness.  3. The pericardium was not assessed.  4. The mitral valve is degenerative. Mild thickening of the mitral valve leaflet.  5. The aortic valve is tricuspid Mild calcification of the aortic valve.  6. The aortic root and ascending aorta are normal in size and structure.  7. The inferior vena cava was dilated in size with <50% respiratory variability.  8. The interatrial septum was not well visualized.  Cardiac cath 03/20/18: Procedures   LEFT HEART CATH AND CORS/GRAFTS ANGIOGRAPHY  Conclusion     Prox RCA to Dist RCA lesion is 100% stenosed.  Ost LM to Ost LAD lesion is 100% stenosed with 100% stenosed side branch in Ost Cx.  Left radial artery graft was visualized by angiography and is normal in caliber.  1st Mrg lesion is 90% stenosed.  Ost 1st Mrg to 1st Mrg lesion is 100% stenosed.  Ost Ramus to Ramus lesion is 100%  stenosed.  Ost 1st Diag lesion is 100% stenosed.  Ost LAD to Prox LAD lesion is 100% stenosed.  SVG graft was visualized by angiography and is normal in caliber.  The graft exhibits mild .  LIMA graft was visualized by angiography and is normal in caliber.  The graft exhibits no disease.  LV end diastolic pressure is mildly elevated.  Origin lesion is 100% stenosed.   1. Native vessel occlusive CAD involving the left main Coronary and RCA 2. Patent LIMA to the LAD 3. Patent free radial artery graft to the OM1. There is severe stenosis in the native vessel distal to the graft that is chronic 4. Patent SVG to PDA 5. Occluded SVG to diagonal 6. Elevated LVEDP 25 mm Hg.   Plan: compared to prior study in 2016 the left main is now occluded. Also the SVG to OM1 no longer supplies retrograde flow to the distal LCx. Graft status is unchanged.  No targets for revascularization. The OM distal to the SVG is too small for PCI. Optimize medical management.       Patient Profile     68 y.o. male with an extensive hx of ASCAD, as well as h/o chronic combined systolic and diastolic heart failure (EF at cath 2016 was 40-50%), hypertension, hyperlipidemia, and type 2 diabetes, admitted for unstable angina and acute on chronic CHF.  Assessment & Plan    1. NSTEMI.  - hx remote CABG, mult PCIs since then - cath report above, no targets for revascularization>>med mgt - continue DAPT, Ranexa, Norvasc. - Coreg, Imdur doses reduced from home rx - reduce Norvasc to titrate CHF rx.  2. Acute on chronic combined systolic/diastolic CHF - EF 95-28%>>41-32% - still w/ volume overload, continue IV Lasix - w/ productive cough, recheck CXR - on decreased dose Coreg and Imdur - Accupril held, Entresto started 03/18  - consider aldactone prior to d/c - diet and fluid education - with Na+ and Cl decreasing, likely need to change to po rx soon. . 3. Hypokalemia and hypomagnesemia -  -  got 60 meq  KCl 03/18 but K+ only went up 0.1, give 120 meq today  - follow daily  4. HTN  - good control on current rx  5. HLD.  - continue high-dose Lipitor and Zetia  6. DM type 2.  - Metformin held for cath, restart in am - discuss adding SGLT2 vs restarting Januvia   7. Hyponatremia  - 2nd CHF, follow  For questions or updates, please contact [place] Please consult [email or web address] for contact info under        Signed, Rosaria Ferries, PA-C  03/22/2018, 8:34 AM     I have seen and examined the patient along with Rosaria Ferries, PA-C .  I have reviewed the chart, notes and new data.  I agree with PA/NP's note.  Key new complaints: no angina, improving dyspnea at rest Key examination changes: no overt hypervolemia by physical exam, but cough is likely expression of CHF Key new findings / data: no V arrhythmia on monitor, stable renal function w diuresis  PLAN: Continue diuresis.  Agree with transition from amlodipine to Entresto, spironolactone and increase to higher doses of carvedilol when euvolemic. It is possible that angina will be less of a problem after the MI and that he will no longer require amlodipine and ranolazine. He is an active individual with good quality of life and will be an appropriate candidate for primary prevention ICD if EF still<35% at reevaluation by echo (can be done 40 days after acute MI without revascularization, but may delay up to 90 days to allow for titration of CHF meds). Meanwhile, recommend LifeVest.  Sanda Klein, MD, [place] [phone] 03/22/2018, 11:48 AM

## 2018-03-23 DIAGNOSIS — E782 Mixed hyperlipidemia: Secondary | ICD-10-CM

## 2018-03-23 LAB — GLUCOSE, CAPILLARY
GLUCOSE-CAPILLARY: 295 mg/dL — AB (ref 70–99)
Glucose-Capillary: 186 mg/dL — ABNORMAL HIGH (ref 70–99)
Glucose-Capillary: 257 mg/dL — ABNORMAL HIGH (ref 70–99)
Glucose-Capillary: 192 mg/dL — ABNORMAL HIGH (ref 70–99)

## 2018-03-23 LAB — BASIC METABOLIC PANEL
ANION GAP: 8 (ref 5–15)
BUN: 22 mg/dL (ref 8–23)
CO2: 22 mmol/L (ref 22–32)
Calcium: 8.1 mg/dL — ABNORMAL LOW (ref 8.9–10.3)
Chloride: 99 mmol/L (ref 98–111)
Creatinine, Ser: 1.11 mg/dL (ref 0.61–1.24)
GFR calc Af Amer: >60 mL/min (ref >60)
GFR calc non Af Amer: >60 mL/min (ref >60)
Glucose, Bld: 189 mg/dL — ABNORMAL HIGH (ref 70–99)
Potassium: 3.7 mmol/L (ref 3.5–5.1)
SODIUM: 129 mmol/L — AB (ref 135–145)

## 2018-03-23 MED ORDER — LINAGLIPTIN 5 MG PO TABS
5.0000 mg | ORAL_TABLET | Freq: Every day | ORAL | Status: DC
  Administered 2018-03-23 – 2018-03-24 (×2): 5 mg via ORAL
  Filled 2018-03-23 (×2): qty 1

## 2018-03-23 MED ORDER — METFORMIN HCL 500 MG PO TABS
1000.0000 mg | ORAL_TABLET | Freq: Two times a day (BID) | ORAL | Status: DC
  Administered 2018-03-23 – 2018-03-24 (×3): 1000 mg via ORAL
  Filled 2018-03-23 (×3): qty 2

## 2018-03-23 MED ORDER — POTASSIUM CHLORIDE CRYS ER 20 MEQ PO TBCR
40.0000 meq | EXTENDED_RELEASE_TABLET | Freq: Two times a day (BID) | ORAL | Status: DC
  Administered 2018-03-23 – 2018-03-24 (×3): 40 meq via ORAL
  Filled 2018-03-23 (×3): qty 2

## 2018-03-23 NOTE — Plan of Care (Signed)

## 2018-03-23 NOTE — Progress Notes (Addendum)
Progress Note  Patient Name: [name] [name] Date of Encounter: 03/23/2018  Primary Cardiologist: Sinclair Grooms, MD   Subjective   Now on cannula and starting to ambulate in the halls. [place] he is improving, but still requires O2 (not on at home) No chest pain  Inpatient Medications    Scheduled Meds:  amLODipine  5 mg Oral Daily   aspirin EC  81 mg Oral Daily   atorvastatin  80 mg Oral q1800   azelastine  1 spray Each Nare BID   carvedilol  6.25 mg Oral BID WC   clopidogrel  75 mg Oral Q breakfast   ezetimibe  10 mg Oral Daily   fluticasone  2 spray Each Nare Daily   furosemide  80 mg Intravenous BID   insulin aspart  0-15 Units Subcutaneous TID WC   isosorbide mononitrate  30 mg Oral Daily   potassium chloride  40 mEq Oral Daily   ranolazine  500 mg Oral BID   sacubitril-valsartan  1 tablet Oral BID   sodium chloride flush  3 mL Intravenous Q12H   Continuous Infusions:  sodium chloride     PRN Meds: sodium chloride, ALPRAZolam, nitroGLYCERIN, ondansetron (ZOFRAN) IV, sodium chloride flush   Vital Signs    Vitals:   03/22/18 2344 03/23/18 0420 03/23/18 0500 03/23/18 0637  BP: 112/67 118/75  116/74  Pulse: 81 74  78  Resp: (!) 21 (!) 22    Temp: 98.1 F (36.7 C) 98 F (36.7 C)    TempSrc: Oral Oral    SpO2: 96% 93%    Weight:   77.9 kg   Height:        Intake/Output Summary (Last 24 hours) at 03/23/2018 0748 Last data filed at 03/23/2018 0550 Gross per 24 hour  Intake 3 ml  Output 1700 ml  Net -1697 ml   Last 3 Weights 03/23/2018 03/22/2018 03/21/2018  Weight (lbs) 171 lb 11.8 oz 174 lb 2.6 oz 173 lb 8 oz  Weight (kg) 77.9 kg 79 kg 78.7 kg      Telemetry    SR w/ no ectopy- Personally Reviewed  ECG    03/18 SR, w/ lateral ST depression - Personally Reviewed  Physical Exam   General: Well developed, well nourished, male in no acute distress Head: Eyes PERRLA, No xanthomas.   Normocephalic and atraumatic Lungs: few rales bases  bilaterally to auscultation. Heart: HRRR S1 S2, without RG. 2/6 SEM  Pulses are 2+ & equal. Minimal JVD. Abdomen: Bowel sounds are present, abdomen soft and non-tender without masses or  hernias noted. Msk: Normal strength and tone for age. Extremities: No clubbing, cyanosis or edema.    Skin:  No rashes or lesions noted. Neuro: Alert and oriented X 3. Psych:  Good affect, responds appropriately   Labs    Chemistry Recent Labs  Lab 03/20/18 0116 03/20/18 1001  03/21/18 0205 03/22/18 0228 03/23/18 0216  NA 128* 130*  --  131* 129* 129*  K 4.2 3.9  --  3.2* 3.3* 3.7  CL 97* 100  --  98 94* 99  CO2 19* 18*  --  23 21* 22  GLUCOSE 200* 214*  --  157* 177* 189*  BUN 14 15  --  13 17 22   CREATININE 1.06 1.15   < > 1.10 1.13 1.11  CALCIUM 9.6 9.1  --  9.0 8.6* 8.1*  PROT 7.7 7.6  --   --   --   --   ALBUMIN  4.2 3.8  --   --   --   --   AST 21 83*  --   --   --   --   ALT 17 24  --   --   --   --   ALKPHOS 76 73  --   --   --   --   BILITOT 0.9 1.2  --   --   --   --   GFRNONAA >60 >60   < > >60 >60 >60  GFRAA >60 >60   < > >60 >60 >60  ANIONGAP 12 12  --  10 14 8    < > = values in this interval not displayed.     Hematology Recent Labs  Lab 03/20/18 1001 03/20/18 1626 03/21/18 0205  WBC 11.5* 11.8* 11.1*  RBC 4.53 4.66 4.33  HGB 14.0 14.0 13.2  HCT 40.3 41.4 37.9*  MCV 89.0 88.8 87.5  MCH 30.9 30.0 30.5  MCHC 34.7 33.8 34.8  RDW 12.9 12.8 12.7  PLT 184 174 143*    Cardiac Enzymes Recent Labs  Lab 03/20/18 1001 03/20/18 1626 03/20/18 2129  TROPONINI 17.04* 23.93* 25.22*    Recent Labs  Lab 03/20/18 0133  TROPIPOC 0.02     BNP Recent Labs  Lab 03/20/18 0116  BNP 376.7*     Radiology    Dg Chest 2 View  Result Date: 03/22/2018 CLINICAL DATA:  Shortness of breath, acute on chronic CHF EXAM: CHEST - 2 VIEW COMPARISON:  03/20/2018 FINDINGS: Upper normal heart size post CABG. Atherosclerotic calcification aorta. Pulmonary vascular congestion. Mild  interstitial infiltrates consistent with pulmonary edema, slightly decreased versus prior exam. No definite pleural effusion or pneumothorax. Bones unremarkable. IMPRESSION: Mild CHF, slightly improved from previous study. Electronically Signed   By: Lavonia Dana M.D.   On: 03/22/2018 09:56   Ct Angio Chest Pe W And/or Wo Contrast  Result Date: 03/20/2018 CLINICAL DATA:  Chest pain and shortness of breath with wheezing EXAM: CT ANGIOGRAPHY CHEST WITH CONTRAST TECHNIQUE: Multidetector CT imaging of the chest was performed using the standard protocol during bolus administration of intravenous contrast. Multiplanar CT image reconstructions and MIPs were obtained to evaluate the vascular anatomy. CONTRAST:  71mL OMNIPAQUE IOHEXOL 350 MG/ML SOLN COMPARISON:  CTA chest 05/22/2008 FINDINGS: Cardiovascular: --Pulmonary arteries: Contrast injection is sufficient to demonstrate satisfactory opacification of the pulmonary arteries to the segmental level. There is no pulmonary embolus. The main pulmonary artery is within normal limits for size. --Aorta: Limited opacification of the aorta due to bolus timing optimization for the pulmonary arteries. Normal variant aortic arch branching pattern with the left vertebral artery arising independently from the aortic arch. The aortic course and caliber are normal. There is moderate aortic atherosclerosis. --Heart: Normal size. No pericardial effusion. Mediastinum/Nodes: No mediastinal, hilar or axillary lymphadenopathy. The visualized thyroid and thoracic esophageal course are unremarkable. Lungs/Pleura: Small pleural effusions with basilar atelectasis and moderate pulmonary edema, worst in the dependent portions of the lung. Upper Abdomen: Contrast bolus timing is not optimized for evaluation of the abdominal organs. Within this limitation, the visualized organs of the upper abdomen are normal. Musculoskeletal: No chest wall abnormality. No acute or significant osseous findings.  Review of the MIP images confirms the above findings. IMPRESSION: 1. No pulmonary embolus. 2. Small pleural effusions with basilar atelectasis and moderate pulmonary edema. 3. Aortic Atherosclerosis (ICD10-I70.0). Electronically Signed   By: Ulyses Jarred M.D.   On: 03/20/2018 05:17   Dg Chest Port 1  View  Result Date: 03/20/2018 CLINICAL DATA:  Pt brought in by [facility] c/o CP 8/10 that came on suddenly while he was laying in bed. Pt has an extensive cardiac hx and has had multiple MIs in the past. Pt has hx of HTN, CHF EXAM: PORTABLE CHEST 1 VIEW COMPARISON:  05/05/2017 FINDINGS: Stable changes from prior CABG surgery. Cardiac silhouette is normal in size. No mediastinal or hilar masses. There are prominent bronchovascular markings with mild interstitial thickening, the latter most evident in the lung bases. No lung consolidation. No convincing pleural effusion and no pneumothorax. Skeletal structures are grossly intact. IMPRESSION: 1. Prominent bronchovascular markings and mild interstitial thickening, representing a change from the prior exam. Findings consistent with pulmonary edema. Electronically Signed   By: Lajean Manes M.D.   On: 03/20/2018 01:39   Vas [place] Burnard Bunting With/wo Tbi  Result Date: 03/22/2018 LOWER EXTREMITY DOPPLER STUDY Indications: Peripheral artery disease. High Risk         Hypertension, hyperlipidemia, Diabetes, coronary artery Factors:          disease.  Vascular Interventions: History of iliac stenting. Comparison Study: Prior study from 03/07/17 is available for comparison. Performing Technologist: Sharion Dove RVS  Examination Guidelines: A complete evaluation includes at minimum, Doppler waveform signals and systolic blood pressure reading at the level of bilateral brachial, anterior tibial, and posterior tibial arteries, when vessel segments are accessible. Bilateral testing is considered an integral part of a complete examination. Photoelectric Plethysmograph (PPG) waveforms and  toe systolic pressure readings are included as required and additional duplex testing as needed. Limited examinations for reoccurring indications may be performed as noted.  ABI Findings: +---------+------------------+-----+---------+--------+  Right     Rt Pressure (mmHg) Index Waveform  Comment   +---------+------------------+-----+---------+--------+  Brachial  108                      triphasic           +---------+------------------+-----+---------+--------+  PTA       98                 0.91  biphasic            +---------+------------------+-----+---------+--------+  DP        80                 0.74  biphasic            +---------+------------------+-----+---------+--------+  Great Toe 79                 0.73                      +---------+------------------+-----+---------+--------+ +---------+------------------+-----+---------+-------+  Left      Lt Pressure (mmHg) Index Waveform  Comment  +---------+------------------+-----+---------+-------+  Brachial  103                      triphasic          +---------+------------------+-----+---------+-------+  PTA       132                1.22  biphasic           +---------+------------------+-----+---------+-------+  DP        120                1.11  biphasic           +---------+------------------+-----+---------+-------+  Great Toe 98  0.91                     +---------+------------------+-----+---------+-------+ +-------+-----------+-----------+------------+------------+  ABI/TBI Today's ABI Today's TBI Previous ABI Previous TBI  +-------+-----------+-----------+------------+------------+  Right   0.91        0.73        0.86         0.57          +-------+-----------+-----------+------------+------------+  Left    1.2         0.91        1.08         0.76          +-------+-----------+-----------+------------+------------+  Summary: Right: Resting right ankle-brachial index is within normal range. No evidence of significant right lower  extremity arterial disease. The right toe-brachial index is normal. Left: Resting left ankle-brachial index is within normal range. No evidence of significant left lower extremity arterial disease. The left toe-brachial index is normal.  *See table(s) above for measurements and observations.  Electronically signed by Servando Snare MD on 03/22/2018 at 4:50:00 PM.    Final      Cardiac Studies   Echo 03/20/18: IMPRESSIONS    1. The left ventricle has severely reduced systolic function, with an ejection fraction of 25-30%. The cavity size was normal. There is mildly increased left ventricular wall thickness. Left ventricular diastolic Doppler parameters are consistent with  impaired relaxation. Indeterminate filling pressures The E/e' is 8-15. There is incoordinate septal motion. Left ventricular diffuse hypokinesis.  2. The right ventricle has low normal systolic function. The cavity was mildly enlarged. There is no increase in right ventricular wall thickness.  3. The pericardium was not assessed.  4. The mitral valve is degenerative. Mild thickening of the mitral valve leaflet.  5. The aortic valve is tricuspid Mild calcification of the aortic valve.  6. The aortic root and ascending aorta are normal in size and structure.  7. The inferior vena cava was dilated in size with <50% respiratory variability.  8. The interatrial septum was not well visualized.  Cardiac cath 03/20/18: Procedures   LEFT HEART CATH AND CORS/GRAFTS ANGIOGRAPHY  Conclusion     Prox RCA to Dist RCA lesion is 100% stenosed.  Ost LM to Ost LAD lesion is 100% stenosed with 100% stenosed side branch in Ost Cx.  Left radial artery graft was visualized by angiography and is normal in caliber.  1st Mrg lesion is 90% stenosed.  Ost 1st Mrg to 1st Mrg lesion is 100% stenosed.  Ost Ramus to Ramus lesion is 100% stenosed.  Ost 1st Diag lesion is 100% stenosed.  Ost LAD to Prox LAD lesion is 100% stenosed.  SVG  graft was visualized by angiography and is normal in caliber.  The graft exhibits mild .  LIMA graft was visualized by angiography and is normal in caliber.  The graft exhibits no disease.  LV end diastolic pressure is mildly elevated.  Origin lesion is 100% stenosed.   1. Native vessel occlusive CAD involving the left main Coronary and RCA 2. Patent LIMA to the LAD 3. Patent free radial artery graft to the OM1. There is severe stenosis in the native vessel distal to the graft that is chronic 4. Patent SVG to PDA 5. Occluded SVG to diagonal 6. Elevated LVEDP 25 mm Hg.   Plan: compared to prior study in 2016 the left main is now occluded. Also the SVG to OM1 no longer supplies retrograde flow to the  distal LCx. Graft status is unchanged.  No targets for revascularization. The OM distal to the SVG is too small for PCI. Optimize medical management.       Patient Profile     68 y.o. male with an extensive hx of ASCAD, as well as h/o chronic combined systolic and diastolic heart failure (EF at cath 2016 was 40-50%), hypertension, hyperlipidemia, and type 2 diabetes, admitted for unstable angina and acute on chronic CHF.  Assessment & Plan    1. NSTEMI.  - see cath report, med rx since no good PCI targets - on DAPT, Ranexa, Norvasc - Coreg and Imdur doses reduced from home rx - will reduce Norvasc further, 5 mg>>2.5 mg qd (w/ LVD, not the best drug)  2. Acute on chronic combined systolic/diastolic CHF - EF lower now, 40-50%>>25-30% - volume improved but still w/ CHF on CXR 03/19, continue IV Lasix - renal function tolerating diuresis and addition of Entresto - will add aldactone 12.5 mg qd - possible change to po rx 24-48 hr - LifeVest ordered . 3. Hypokalemia and hypomagnesemia -  - s/p 120 meq on 03/19 and K+ improved - increase daily supplement to 80 meq while on IV Lasix - recheck Mg in am  4. HTN  - stable  5. HLD.  - on high-dose Lipitor and Zetia  6. DM type  2.  - cath was 03/17, ok to restart metformin - consider SGLT2, but for now, will restart Januvia   7. Hyponatremia  - 2nd CHF, stable overnight  For questions or updates, please contact [place] Please consult [email or web address] for contact info under        Signed, Rosaria Ferries, PA-C  03/23/2018, 7:48 AM      Attending Note:   The patient was seen and examined.  Agree with assessment and plan as noted above.  Changes made to the above note as needed.  Patient seen and independently examined with Rosaria Ferries, PA .   We discussed all aspects of the encounter. I agree with the assessment and plan as stated above.  1.  Coronary artery disease: Patient has a history of remote coronary artery bypass grafting.   he has had multiple PCI's since that time. He presented several days ago with a non-ST segment elevation myocardial infarction.  Heart catheterization revealed occlusive disease involved the left main the right coronary artery.  The LIMA to LAD was patent.  There is a patent free radial graft to the obtuse marginal #1.  Is a severe stenosis in the OM distal to the graft anastomosis.  There was a patent saphenous vein graft to the posterior descending artery.  The SVG to the diagonal was occluded.  He is done well.  He is not had any episodes of angina.  Continue current medications as listed above.  2.  Recent non-ST segment elevation myocardial infarction:  See  above  3.  Acute on chronic combined systolic and diastolic congestive heart failure: His ejection fraction is fallen to 25 to 30%.  Dysfunction.  Elevated filling pressures at the time of heart catheterization. He has diuresed a net 5.4 L so far during this admission.  He should be considered for Jardiance upon discharge.   Will get a case manager to evaluate his cost since he will be on multiple expensive medications DC amlodipine Continue entresto - titrate up as tolerated in several weeks LifeVest has been  ordered. He has an allergy to aldactone   4.  Essential HTN:  DC amlodipine given recent reduction in LV function.  His blood pressure seems to be well controlled on Entresto.    5.  Hyperlipidemia:   Cont. atorva  6.  DM:   Is currently on Tragenta  Would ideally be on metformin and Jardiance upon DC .       I have spent a total of 40 minutes with patient reviewing hospital  notes , telemetry, EKGs, labs and examining patient as well as establishing an assessment and plan that was discussed with the patient. > 50% of time was spent in direct patient care.    Thayer Headings, Brooke Bonito., MD, [facility] 03/23/2018, 8:21 AM [address]  [place] Pager [phone]

## 2018-03-23 NOTE — TOC Benefit Eligibility Note (Signed)
Transition of Care [facility]) Benefit Eligibility Note    Patient Details  Name: [name] [name] MRN: [id number] Date of Birth: [birth year]   Medication/Dose: Delene Loll 24/26mg  BID  Covered?: Yes  Tier: 2 Drug  Prescription Coverage Preferred Pharmacy: Diannia Ruder with Person/Company/Phone Number:: [facility] [phone]  Co-Pay: $40.00  Prior Approval: No          [place] Phone Number: 03/23/2018, 11:21 AM

## 2018-03-23 NOTE — Progress Notes (Signed)
Inpatient Diabetes Program Recommendations  AACE/ADA: New Consensus Statement on Inpatient Glycemic Control (2015)  Target Ranges:  Prepandial:   less than 140 mg/dL      Peak postprandial:   less than 180 mg/dL (1-2 hours)      Critically ill patients:  140 - 180 mg/dL   Results for [name] [name], [name] [name] (MRN [id number]) as of 03/23/2018 07:25  Ref. Range 03/22/2018 06:47 03/22/2018 13:21 03/22/2018 17:18 03/22/2018 22:29  Glucose-Capillary Latest Ref Range: 70 - 99 mg/dL 144 (H)  2 units NOVOLOG  296 (H)  8 units NOVOLOG  224 (H)  5 units NOVOLOG  168 (H)   Results for [name], [name] (MRN [id number]) as of 03/23/2018 07:25  Ref. Range 03/20/2018 10:01  Hemoglobin A1C Latest Ref Range: 4.8 - 5.6 % 7.1 (H)     Home DM Meds: Metformin 1000 mg BID       Januvia 100 mg Daily  Current Orders: Novolog Moderate Correction Scale/ SSI (0-15 units) TID AC    MD- Please consider the following in-hospital insulin adjustments:  While home Diabetes Meds are on hold, please consider adding Novolog Meal Coverage:  Novolog 4 units TID with meals  (Please add the following Hold Parameters: Hold if pt eats <50% of meal, Hold if pt NPO)      --Will follow patient during hospitalization--  Wyn Quaker RN, MSN, CDE Diabetes Coordinator Inpatient Glycemic Control Team Team Pager: [phone] (8a-5p)

## 2018-03-24 ENCOUNTER — Other Ambulatory Visit: Payer: Self-pay | Admitting: Physician Assistant

## 2018-03-24 DIAGNOSIS — E118 Type 2 diabetes mellitus with unspecified complications: Secondary | ICD-10-CM

## 2018-03-24 DIAGNOSIS — I5023 Acute on chronic systolic (congestive) heart failure: Secondary | ICD-10-CM

## 2018-03-24 LAB — BASIC METABOLIC PANEL
Anion gap: 11 (ref 5–15)
BUN: 22 mg/dL (ref 8–23)
CALCIUM: 9 mg/dL (ref 8.9–10.3)
CO2: 22 mmol/L (ref 22–32)
Chloride: 97 mmol/L — ABNORMAL LOW (ref 98–111)
Creatinine, Ser: 1.15 mg/dL (ref 0.61–1.24)
GFR calc Af Amer: >60 mL/min (ref >60)
GFR calc non Af Amer: >60 mL/min (ref >60)
Glucose, Bld: 193 mg/dL — ABNORMAL HIGH (ref 70–99)
Potassium: 4.1 mmol/L (ref 3.5–5.1)
SODIUM: 130 mmol/L — AB (ref 135–145)

## 2018-03-24 LAB — GLUCOSE, CAPILLARY
Glucose-Capillary: 208 mg/dL — ABNORMAL HIGH (ref 70–99)
Glucose-Capillary: 265 mg/dL — ABNORMAL HIGH (ref 70–99)

## 2018-03-24 MED ORDER — FUROSEMIDE 40 MG PO TABS: 40.0000 mg | ORAL_TABLET | Freq: Every day | ORAL | Status: DC

## 2018-03-24 MED ORDER — SACUBITRIL-VALSARTAN 24-26 MG PO TABS: 1.0000 | ORAL_TABLET | Freq: Two times a day (BID) | ORAL | 0 refills | Status: DC

## 2018-03-24 MED ORDER — CARVEDILOL 6.25 MG PO TABS: 6.2500 mg | ORAL_TABLET | Freq: Two times a day (BID) | ORAL | 3 refills | Status: DC

## 2018-03-24 MED ORDER — SACUBITRIL-VALSARTAN 24-26 MG PO TABS: 1.0000 | ORAL_TABLET | Freq: Two times a day (BID) | ORAL | 5 refills | Status: DC

## 2018-03-24 MED ORDER — FUROSEMIDE 40 MG PO TABS: 40.0000 mg | ORAL_TABLET | Freq: Every day | ORAL | 3 refills | Status: DC

## 2018-03-24 MED ORDER — ATORVASTATIN CALCIUM 80 MG PO TABS: 80.0000 mg | ORAL_TABLET | Freq: Every day | ORAL | 3 refills | Status: DC

## 2018-03-24 MED ORDER — ISOSORBIDE MONONITRATE ER 30 MG PO TB24: 30.0000 mg | ORAL_TABLET | Freq: Every day | ORAL | 2 refills | Status: DC

## 2018-03-24 MED ORDER — POTASSIUM CHLORIDE CRYS ER 20 MEQ PO TBCR: 20.0000 meq | EXTENDED_RELEASE_TABLET | Freq: Every day | ORAL | 3 refills | Status: DC

## 2018-03-24 NOTE — Plan of Care (Signed)
Pt d/c home with wife via private vehicle. Discharge instructions given, all questions answered.  Problem: Health Behavior/Discharge Planning: Goal: Ability to manage health-related needs will improve Outcome: Adequate for Discharge   Problem: Clinical Measurements: Goal: Ability to maintain clinical measurements within normal limits will improve Outcome: Adequate for Discharge Goal: Will remain free from infection Outcome: Adequate for Discharge Goal: Diagnostic test results will improve Outcome: Adequate for Discharge Goal: Respiratory complications will improve Outcome: Adequate for Discharge Goal: Cardiovascular complication will be avoided Outcome: Adequate for Discharge   Problem: Activity: Goal: Risk for activity intolerance will decrease Outcome: Adequate for Discharge   Problem: Nutrition: Goal: Adequate nutrition will be maintained Outcome: Adequate for Discharge   Problem: Coping: Goal: Level of anxiety will decrease Outcome: Adequate for Discharge   Problem: Elimination: Goal: Will not experience complications related to bowel motility Outcome: Adequate for Discharge Goal: Will not experience complications related to urinary retention Outcome: Adequate for Discharge   Problem: Pain Managment: Goal: General experience of comfort will improve Outcome: Adequate for Discharge   Problem: Safety: Goal: Ability to remain free from injury will improve Outcome: Adequate for Discharge   Problem: Skin Integrity: Goal: Risk for impaired skin integrity will decrease Outcome: Adequate for Discharge

## 2018-03-24 NOTE — Care Management (Signed)
Discussed Brilinta with patient and advised of $40 copay.  Pt and wife verbalized understanding and stated this amount is manageable.  Pt given copay and 30day free card.

## 2018-03-24 NOTE — Progress Notes (Signed)
Progress Note  Patient Name: [name] [name] Date of Encounter: 03/24/2018  Primary Cardiologist: Sinclair Grooms, MD   Subjective   Feels greatly improved. Walked in the hallway without oxygen and without hypoxia. Denies any angina. Net diuresis 1.2 L overnight, total of almost 7 L since admission, weight surprisingly not changed. Reports that when he was feeling well he weighed approximately 168 pounds on his home scale.  Today 171 pounds. No serious arrhythmia on monitor.  Inpatient Medications    Scheduled Meds:  aspirin EC  81 mg Oral Daily   atorvastatin  80 mg Oral q1800   azelastine  1 spray Each Nare BID   carvedilol  6.25 mg Oral BID WC   clopidogrel  75 mg Oral Q breakfast   ezetimibe  10 mg Oral Daily   fluticasone  2 spray Each Nare Daily   furosemide  80 mg Intravenous BID   insulin aspart  0-15 Units Subcutaneous TID WC   isosorbide mononitrate  30 mg Oral Daily   linagliptin  5 mg Oral Daily   metFORMIN  1,000 mg Oral BID WC   potassium chloride  40 mEq Oral BID   ranolazine  500 mg Oral BID   sacubitril-valsartan  1 tablet Oral BID   sodium chloride flush  3 mL Intravenous Q12H   Continuous Infusions:  sodium chloride     PRN Meds: sodium chloride, ALPRAZolam, nitroGLYCERIN, ondansetron (ZOFRAN) IV, sodium chloride flush   Vital Signs    Vitals:   03/24/18 0403 03/24/18 0500 03/24/18 0828 03/24/18 1100  BP: 111/73  111/65 130/78  Pulse: 73  82 80  Resp: 15  17 16   Temp: 98 F (36.7 C)  98.1 F (36.7 C) 98.1 F (36.7 C)  TempSrc: Oral  Oral Oral  SpO2: 95%  97% 96%  Weight:  77.8 kg    Height:        Intake/Output Summary (Last 24 hours) at 03/24/2018 1241 Last data filed at 03/24/2018 1100 Gross per 24 hour  Intake 720 ml  Output 1875 ml  Net -1155 ml   Last 3 Weights 03/24/2018 03/23/2018 03/22/2018  Weight (lbs) 171 lb 8.3 oz 171 lb 11.8 oz 174 lb 2.6 oz  Weight (kg) 77.8 kg 77.9 kg 79 kg      Telemetry    Sinus  rhythm with PVCs- Personally Reviewed  ECG    No new tracing- Personally Reviewed  Physical Exam  Appears well. GEN: No acute distress.   Neck: No JVD Cardiac: RRR, 2/6 systolic ejection murmur is early to mid peaking, no diastolic murmurs, rubs, or gallops.  Respiratory: Clear to auscultation bilaterally. GI: Soft, nontender, non-distended  MS: No edema; No deformity. Neuro:  Nonfocal  Psych: Normal affect   Labs    Chemistry Recent Labs  Lab 03/20/18 0116 03/20/18 1001  03/22/18 0228 03/23/18 0216 03/24/18 0104  NA 128* 130*   < > 129* 129* 130*  K 4.2 3.9   < > 3.3* 3.7 4.1  CL 97* 100   < > 94* 99 97*  CO2 19* 18*   < > 21* 22 22  GLUCOSE 200* 214*   < > 177* 189* 193*  BUN 14 15   < > 17 22 22   CREATININE 1.06 1.15   < > 1.13 1.11 1.15  CALCIUM 9.6 9.1   < > 8.6* 8.1* 9.0  PROT 7.7 7.6  --   --   --   --  ALBUMIN 4.2 3.8  --   --   --   --   AST 21 83*  --   --   --   --   ALT 17 24  --   --   --   --   ALKPHOS 76 73  --   --   --   --   BILITOT 0.9 1.2  --   --   --   --   GFRNONAA >60 >60   < > >60 >60 >60  GFRAA >60 >60   < > >60 >60 >60  ANIONGAP 12 12   < > 14 8 11    < > = values in this interval not displayed.     Hematology Recent Labs  Lab 03/20/18 1001 03/20/18 1626 03/21/18 0205  WBC 11.5* 11.8* 11.1*  RBC 4.53 4.66 4.33  HGB 14.0 14.0 13.2  HCT 40.3 41.4 37.9*  MCV 89.0 88.8 87.5  MCH 30.9 30.0 30.5  MCHC 34.7 33.8 34.8  RDW 12.9 12.8 12.7  PLT 184 174 143*    Cardiac Enzymes Recent Labs  Lab 03/20/18 1001 03/20/18 1626 03/20/18 2129  TROPONINI 17.04* 23.93* 25.22*    Recent Labs  Lab 03/20/18 0133  TROPIPOC 0.02     BNP Recent Labs  Lab 03/20/18 0116  BNP 376.7*     DDimer No results for input(s): DDIMER in the last 168 hours.   Radiology    Vas [place] Abi With/wo Tbi  Result Date: 03/22/2018 LOWER EXTREMITY DOPPLER STUDY Indications: Peripheral artery disease. High Risk         Hypertension, hyperlipidemia,  Diabetes, coronary artery Factors:          disease.  Vascular Interventions: History of iliac stenting. Comparison Study: Prior study from 03/07/17 is available for comparison. Performing Technologist: Sharion Dove RVS  Examination Guidelines: A complete evaluation includes at minimum, Doppler waveform signals and systolic blood pressure reading at the level of bilateral brachial, anterior tibial, and posterior tibial arteries, when vessel segments are accessible. Bilateral testing is considered an integral part of a complete examination. Photoelectric Plethysmograph (PPG) waveforms and toe systolic pressure readings are included as required and additional duplex testing as needed. Limited examinations for reoccurring indications may be performed as noted.  ABI Findings: +---------+------------------+-----+---------+--------+  Right     Rt Pressure (mmHg) Index Waveform  Comment   +---------+------------------+-----+---------+--------+  Brachial  108                      triphasic           +---------+------------------+-----+---------+--------+  PTA       98                 0.91  biphasic            +---------+------------------+-----+---------+--------+  DP        80                 0.74  biphasic            +---------+------------------+-----+---------+--------+  Great Toe 79                 0.73                      +---------+------------------+-----+---------+--------+ +---------+------------------+-----+---------+-------+  Left      Lt Pressure (mmHg) Index Waveform  Comment  +---------+------------------+-----+---------+-------+  Brachial  103  triphasic          +---------+------------------+-----+---------+-------+  PTA       132                1.22  biphasic           +---------+------------------+-----+---------+-------+  DP        120                1.11  biphasic           +---------+------------------+-----+---------+-------+  Great Toe 98                 0.91                      +---------+------------------+-----+---------+-------+ +-------+-----------+-----------+------------+------------+  ABI/TBI Today's ABI Today's TBI Previous ABI Previous TBI  +-------+-----------+-----------+------------+------------+  Right   0.91        0.73        0.86         0.57          +-------+-----------+-----------+------------+------------+  Left    1.2         0.91        1.08         0.76          +-------+-----------+-----------+------------+------------+  Summary: Right: Resting right ankle-brachial index is within normal range. No evidence of significant right lower extremity arterial disease. The right toe-brachial index is normal. Left: Resting left ankle-brachial index is within normal range. No evidence of significant left lower extremity arterial disease. The left toe-brachial index is normal.  *See table(s) above for measurements and observations.  Electronically signed by Servando Snare MD on 03/22/2018 at 4:50:00 PM.    Final     Cardiac Studies   Echo 03/20/18: IMPRESSIONS   1. The left ventricle has severely reduced systolic function, with an ejection fraction of 25-30%. The cavity size was normal. There is mildly increased left ventricular wall thickness. Left ventricular diastolic Doppler parameters are consistent with  impaired relaxation. Indeterminate filling pressures The E/e' is 8-15. There is incoordinate septal motion. Left ventricular diffuse hypokinesis. 2. The right ventricle has low normal systolic function. The cavity was mildly enlarged. There is no increase in right ventricular wall thickness. 3. The pericardium was not assessed. 4. The mitral valve is degenerative. Mild thickening of the mitral valve leaflet. 5. The aortic valve is tricuspid Mild calcification of the aortic valve. 6. The aortic root and ascending aorta are normal in size and structure. 7. The inferior vena cava was dilated in size with <50% respiratory variability. 8. The interatrial  septum was not well visualized.  Cardiac cath 03/20/18: Procedures   LEFT HEART CATH AND CORS/GRAFTS ANGIOGRAPHY  Conclusion     Prox RCA to Dist RCA lesion is 100% stenosed.  Ost LM to Ost LAD lesion is 100% stenosed with 100% stenosed side branch in Ost Cx.  Left radial artery graft was visualized by angiography and is normal in caliber.  1st Mrg lesion is 90% stenosed.  Ost 1st Mrg to 1st Mrg lesion is 100% stenosed.  Ost Ramus to Ramus lesion is 100% stenosed.  Ost 1st Diag lesion is 100% stenosed.  Ost LAD to Prox LAD lesion is 100% stenosed.  SVG graft was visualized by angiography and is normal in caliber.  The graft exhibits mild .  LIMA graft was visualized by angiography and is normal in caliber.  The graft exhibits no disease.  LV  end diastolic pressure is mildly elevated.  Origin lesion is 100% stenosed.  1. Native vessel occlusive CAD involving the left main Coronary and RCA 2. Patent LIMA to the LAD 3. Patent free radial artery graft to the OM1. There is severe stenosis in the native vessel distal to the graft that is chronic 4. Patent SVG to PDA 5. Occluded SVG to diagonal 6. Elevated LVEDP 25 mm Hg.   Plan: compared to prior study in 2016 the left main is now occluded. Also the SVG to OM1 no longer supplies retrograde flow to the distal LCx. Graft status is unchanged. No targets for revascularization. The OM distal to the SVG is too small for PCI. Optimize medical management.       Patient Profile     68 y.o. male with an extensive hx of ASCAD,as well as h/o chronic combined systolic and diastolic heart failure (EF at cath 2016 was 40-50%), hypertension, hyperlipidemia, and type 2 diabetes, admitted for NSTEMI and acute on chronic CHF.  Coronary angiography showed interval occlusion of the left main coronary artery and the retrograde flow to the left circumflex coronary artery via the SVG to OM1 bypass, but there were no options for  revascularization.  Assessment & Plan    1. NSTEMI.  hx remote CABG, mult PCIs since then.  STEMI appears to be due to occlusion of the native left coronary artery and/or the retrograde limb of the left circumflex coronary artery, without revascularization options. He does not have angina even after we stopped amlodipine and ranolazine.  Waiting to be fitted for LifeVest before discharge.  2. Acute on chronic combined systolic/diastolic CHF Interval decrease in left ventricular ejection fraction down to 25-30%.  Currently feels well without orthopnea cough or exertional hypoxemia/dyspnea.  Transition from ACE inhibitor to [facility] during this admission.  On carvedilol.  Despite the absence of a change in weight, he appears clinically euvolemic.  Stop IV diuretics and switch to furosemide 40 mg once daily, which may need to be titrated as an outpatient.  Had a very long discussion regarding sodium restriction and daily weight monitoring, signs and symptoms of heart failure exacerbation such as orthopnea and PND and exertional dyspnea.  Remains mildly hyponatremic and discussed limiting fluid intake to no more than 2000 mL a day.  3. HLD.  continue high-dose Lipitor and Zetia  4. DM type 2.  Metformin held for cath, restart upon discharge.  He is a perfect candidate for outpatient therapy with SGLT2 inhibitors rather than other antidiabetic agents.   For questions or updates, please contact Martin Please consult [email or web address] for contact info under        Signed, Sanda Klein, MD  03/24/2018, 12:41 PM

## 2018-03-24 NOTE — Discharge Summary (Signed)
Discharge Summary    Patient ID: [name] [name] MRN: [id number]; DOB: 1950/09/13  Admit date: 03/20/2018 Discharge date: 03/24/2018  Primary Care Provider: [name], MD  Primary Cardiologist: Sinclair Grooms, MD  Primary Electrophysiologist:  None   Discharge Diagnoses    Principal Problem:   Unstable angina [facility]) Active Problems:   Hypertension   Hyperlipidemia   Diabetes mellitus ([place])   Peripheral arterial disease ([place])   Coronary artery disease involving native coronary artery of native heart with angina pectoris ([place])   Chronic combined systolic and diastolic CHF (congestive heart failure) ([facility])   Chest pain   Allergies Allergies  Allergen Reactions   Aldactone [Spironolactone] Nausea And Vomiting   Percocet [Oxycodone-Acetaminophen] Other (See Comments)    "nightmares, stomach problems, and foggy brain"   Avandia [Rosiglitazone] Other (See Comments)    Weight gain   Codeine Itching, Swelling and Other (See Comments)    Burning and heart flutter dizziness    Diagnostic Studies/Procedures    Cath 03/20/2018  Prox RCA to Dist RCA lesion is 100% stenosed.  Ost LM to Ost LAD lesion is 100% stenosed with 100% stenosed side branch in Ost Cx.  Left radial artery graft was visualized by angiography and is normal in caliber.  1st Mrg lesion is 90% stenosed.  Ost 1st Mrg to 1st Mrg lesion is 100% stenosed.  Ost Ramus to Ramus lesion is 100% stenosed.  Ost 1st Diag lesion is 100% stenosed.  Ost LAD to Prox LAD lesion is 100% stenosed.  SVG graft was visualized by angiography and is normal in caliber.  The graft exhibits mild .  LIMA graft was visualized by angiography and is normal in caliber.  The graft exhibits no disease.  LV end diastolic pressure is mildly elevated.  Origin lesion is 100% stenosed.   1. Native vessel occlusive CAD involving the left main Coronary and RCA 2. Patent LIMA to the LAD 3. Patent free radial artery graft to the  OM1. There is severe stenosis in the native vessel distal to the graft that is chronic 4. Patent SVG to PDA 5. Occluded SVG to diagonal 6. Elevated LVEDP 25 mm Hg.   Plan: compared to prior study in 2016 the left main is now occluded. Also the SVG to OM1 no longer supplies retrograde flow to the distal LCx. Graft status is unchanged.  No targets for revascularization. The OM distal to the SVG is too small for PCI. Optimize medical management.    Echo 03/20/2018 IMPRESSIONS    1. The left ventricle has severely reduced systolic function, with an ejection fraction of 25-30%. The cavity size was normal. There is mildly increased left ventricular wall thickness. Left ventricular diastolic Doppler parameters are consistent with  impaired relaxation. Indeterminate filling pressures The E/e' is 8-15. There is incoordinate septal motion. Left ventricular diffuse hypokinesis.  2. The right ventricle has low normal systolic function. The cavity was mildly enlarged. There is no increase in right ventricular wall thickness.  3. The pericardium was not assessed.  4. The mitral valve is degenerative. Mild thickening of the mitral valve leaflet.  5. The aortic valve is tricuspid Mild calcification of the aortic valve.  6. The aortic root and ascending aorta are normal in size and structure.  7. The inferior vena cava was dilated in size with <50% respiratory variability.  8. The interatrial septum was not well visualized.  _____________   History of Present Illness     [name] [name] is a 68  y.o. male who is being seen today for the evaluation of chest pain and CHF at the request of Dr. Addison Lank.  This is a 68yo male with an extensive hx of ASCAD.  He is s/p remote CABG x 5 (LIMA->LAD, VG->D1, VG->RI, VG->PDA, L Radial->OM2) and percutaneous coronary interventions (06/2010 PCI/DES to the Diag (2.25x28 Promus Element DES) with VG->Diag occluded. 08/2010 Attempted PTCA of the OM2 via the free radial  graft. Unsuccessful. Lesion felt to be 2/2 suture. Then NSTEMI s/p [facility] with severe native and graph dz; D1 that was stented in 2012 now occulded: Rx therapy recommended).  His last cath was in 2016 showing 50% distal LM, 80% ostial LAD, occluded LCx, 99% RI, occluded mid RCA and occluded stented D1.  The SVG to RI and SVG to Diag were occluded and free radial to OM, SVG to PDA and LIMA to LAD were patent.  There was a 90% stenosis the OM past the insertion of the free radial graft and not amenable to PCI.  Medical management was recommended.  EF at that time was 40-50% by cath.    He also has  chronic combined systolic and diastolic heart failure (EF at cath 2016 was 40-50%), hypertension, hyperlipidemia, and type 2 diabetes.  He was in his USOH until tonight when he started having SOB around 10:30pm.  He was getting ready for bed when it started.  He then developed CP that he described as a pressure across his chest with some radiation into his neck.  He called EMS and was given 6 SL NTG and nitropaste with improvement in CP.  He says that the CP is not really similar to prior angina and that his lungs feel "raw" when he breaths.  He had some wheezing and was hypoxic on arrival.  Cxray showed CHF and BNP elevated at 300.  He denies any recent fever, chills or exposure to anyone ill and no recent travel.  His pain started to come back in ER.  He was given Lasix 40mg  IV and started on IV Heparin gtt and ntg gtt.  EKG showed marked ischemic ST depression throughout the inferior and precordial leads.  Dr. Gwenlyn Found, the interventionalist, was called due to ST elevation in aVR but felt he was not a STEMI.  Currently he complains of no CP.     Hospital Course     Consultants: N/A  Patient was admitted for both acute on chronic combined systolic and diastolic heart failure and unstable angina.  He underwent cardiac catheterization on 03/20/2018, this revealed patent LIMA to LAD, patent free radial artery to OM1,  severe stenosis in the native vessel distal to the graft which is chronic, patent SVG to PDA, occluded SVG to diagonal, elevated LVEDP of 25 mmHg.  Will compare to the previous study in 2016, the left main is now occluded and SVG to OM1 is also occluded.  Otherwise graft status is unchanged.  There was no target for revascularization.  Medical therapy was recommended.  Echocardiogram obtained on 03/20/2018 showed EF 25 to 30%, severe global hypokinesis, grade 1 DD, mild MR. Home Norvasc was reduced to uptitrate heart failure regimen.  Delene Loll was started on 3/18.  Accupril was discontinued.  Patient was seen on 03/24/2018, his dry weight appears to be 171 pounds.  He was felt to be euvolemic on physical exam.  I/O is -7 L since admission.  He was fitted for LifeVest prior to discharge. Despite stopping the amlodipine and ranolazine during this admission,  he has not had any further chest pain.   He will need 1-2 week BMET and will arrange for 2 weeks e-visit for uptitration of entresto. He was fitted for Lifevest prior to discharge  _____________  Discharge Vitals Blood pressure 130/78, pulse 80, temperature 98.1 F (36.7 C), temperature source Oral, resp. rate 16, height 5' 6.5" (1.689 m), weight 77.8 kg, SpO2 96 %.  Filed Weights   03/22/18 0541 03/23/18 0500 03/24/18 0500  Weight: 79 kg 77.9 kg 77.8 kg    Labs & Radiologic Studies    CBC No results for input(s): WBC, NEUTROABS, HGB, HCT, MCV, PLT in the last 72 hours. Basic Metabolic Panel Recent Labs    03/23/18 0216 03/24/18 0104  NA 129* 130*  K 3.7 4.1  CL 99 97*  CO2 22 22  GLUCOSE 189* 193*  BUN 22 22  CREATININE 1.11 1.15  CALCIUM 8.1* 9.0   Liver Function Tests No results for input(s): AST, ALT, ALKPHOS, BILITOT, PROT, ALBUMIN in the last 72 hours. No results for input(s): LIPASE, AMYLASE in the last 72 hours. Cardiac Enzymes No results for input(s): CKTOTAL, CKMB, CKMBINDEX, TROPONINI in the last 72 hours. BNP Invalid  input(s): POCBNP D-Dimer No results for input(s): DDIMER in the last 72 hours. Hemoglobin A1C No results for input(s): HGBA1C in the last 72 hours. Fasting Lipid Panel No results for input(s): CHOL, HDL, LDLCALC, TRIG, CHOLHDL, LDLDIRECT in the last 72 hours. Thyroid Function Tests No results for input(s): TSH, T4TOTAL, T3FREE, THYROIDAB in the last 72 hours.  Invalid input(s): FREET3 _____________  Dg Chest 2 View  Result Date: 03/22/2018 CLINICAL DATA:  Shortness of breath, acute on chronic CHF EXAM: CHEST - 2 VIEW COMPARISON:  03/20/2018 FINDINGS: Upper normal heart size post CABG. Atherosclerotic calcification aorta. Pulmonary vascular congestion. Mild interstitial infiltrates consistent with pulmonary edema, slightly decreased versus prior exam. No definite pleural effusion or pneumothorax. Bones unremarkable. IMPRESSION: Mild CHF, slightly improved from previous study. Electronically Signed   By: Lavonia Dana M.D.   On: 03/22/2018 09:56   Ct Angio Chest Pe W And/or Wo Contrast  Result Date: 03/20/2018 CLINICAL DATA:  Chest pain and shortness of breath with wheezing EXAM: CT ANGIOGRAPHY CHEST WITH CONTRAST TECHNIQUE: Multidetector CT imaging of the chest was performed using the standard protocol during bolus administration of intravenous contrast. Multiplanar CT image reconstructions and MIPs were obtained to evaluate the vascular anatomy. CONTRAST:  35mL OMNIPAQUE IOHEXOL 350 MG/ML SOLN COMPARISON:  CTA chest 05/22/2008 FINDINGS: Cardiovascular: --Pulmonary arteries: Contrast injection is sufficient to demonstrate satisfactory opacification of the pulmonary arteries to the segmental level. There is no pulmonary embolus. The main pulmonary artery is within normal limits for size. --Aorta: Limited opacification of the aorta due to bolus timing optimization for the pulmonary arteries. Normal variant aortic arch branching pattern with the left vertebral artery arising independently from the aortic  arch. The aortic course and caliber are normal. There is moderate aortic atherosclerosis. --Heart: Normal size. No pericardial effusion. Mediastinum/Nodes: No mediastinal, hilar or axillary lymphadenopathy. The visualized thyroid and thoracic esophageal course are unremarkable. Lungs/Pleura: Small pleural effusions with basilar atelectasis and moderate pulmonary edema, worst in the dependent portions of the lung. Upper Abdomen: Contrast bolus timing is not optimized for evaluation of the abdominal organs. Within this limitation, the visualized organs of the upper abdomen are normal. Musculoskeletal: No chest wall abnormality. No acute or significant osseous findings. Review of the MIP images confirms the above findings. IMPRESSION: 1. No pulmonary embolus. 2.  Small pleural effusions with basilar atelectasis and moderate pulmonary edema. 3. Aortic Atherosclerosis (ICD10-I70.0). Electronically Signed   By: Ulyses Jarred M.D.   On: 03/20/2018 05:17   Dg Chest Port 1 View  Result Date: 03/20/2018 CLINICAL DATA:  Pt brought in by [facility] c/o CP 8/10 that came on suddenly while he was laying in bed. Pt has an extensive cardiac hx and has had multiple MIs in the past. Pt has hx of HTN, CHF EXAM: PORTABLE CHEST 1 VIEW COMPARISON:  05/05/2017 FINDINGS: Stable changes from prior CABG surgery. Cardiac silhouette is normal in size. No mediastinal or hilar masses. There are prominent bronchovascular markings with mild interstitial thickening, the latter most evident in the lung bases. No lung consolidation. No convincing pleural effusion and no pneumothorax. Skeletal structures are grossly intact. IMPRESSION: 1. Prominent bronchovascular markings and mild interstitial thickening, representing a change from the prior exam. Findings consistent with pulmonary edema. Electronically Signed   By: Lajean Manes M.D.   On: 03/20/2018 01:39   Vas [place] Burnard Bunting With/wo Tbi  Result Date: 03/22/2018 LOWER EXTREMITY DOPPLER STUDY  Indications: Peripheral artery disease. High Risk         Hypertension, hyperlipidemia, Diabetes, coronary artery Factors:          disease.  Vascular Interventions: History of iliac stenting. Comparison Study: Prior study from 03/07/17 is available for comparison. Performing Technologist: Sharion Dove RVS  Examination Guidelines: A complete evaluation includes at minimum, Doppler waveform signals and systolic blood pressure reading at the level of bilateral brachial, anterior tibial, and posterior tibial arteries, when vessel segments are accessible. Bilateral testing is considered an integral part of a complete examination. Photoelectric Plethysmograph (PPG) waveforms and toe systolic pressure readings are included as required and additional duplex testing as needed. Limited examinations for reoccurring indications may be performed as noted.  ABI Findings: +---------+------------------+-----+---------+--------+  Right     Rt Pressure (mmHg) Index Waveform  Comment   +---------+------------------+-----+---------+--------+  Brachial  108                      triphasic           +---------+------------------+-----+---------+--------+  PTA       98                 0.91  biphasic            +---------+------------------+-----+---------+--------+  DP        80                 0.74  biphasic            +---------+------------------+-----+---------+--------+  Great Toe 79                 0.73                      +---------+------------------+-----+---------+--------+ +---------+------------------+-----+---------+-------+  Left      Lt Pressure (mmHg) Index Waveform  Comment  +---------+------------------+-----+---------+-------+  Brachial  103                      triphasic          +---------+------------------+-----+---------+-------+  PTA       132                1.22  biphasic           +---------+------------------+-----+---------+-------+  DP        120  1.11  biphasic            +---------+------------------+-----+---------+-------+  Great Toe 98                 0.91                     +---------+------------------+-----+---------+-------+ +-------+-----------+-----------+------------+------------+  ABI/TBI Today's ABI Today's TBI Previous ABI Previous TBI  +-------+-----------+-----------+------------+------------+  Right   0.91        0.73        0.86         0.57          +-------+-----------+-----------+------------+------------+  Left    1.2         0.91        1.08         0.76          +-------+-----------+-----------+------------+------------+  Summary: Right: Resting right ankle-brachial index is within normal range. No evidence of significant right lower extremity arterial disease. The right toe-brachial index is normal. Left: Resting left ankle-brachial index is within normal range. No evidence of significant left lower extremity arterial disease. The left toe-brachial index is normal.  *See table(s) above for measurements and observations.  Electronically signed by Servando Snare MD on 03/22/2018 at 4:50:00 PM.    Final    Disposition   Pt is being discharged home today in good condition.  Follow-up Plans & Appointments       Discharge Medications   Allergies as of 03/24/2018      Reactions   Aldactone [spironolactone] Nausea And Vomiting   Percocet [oxycodone-acetaminophen] Other (See Comments)   "nightmares, stomach problems, and foggy brain"   Avandia [rosiglitazone] Other (See Comments)   Weight gain   Codeine Itching, Swelling, Other (See Comments)   Burning and heart flutter dizziness      Medication List    STOP taking these medications   amLODipine 10 MG tablet Commonly known as:  NORVASC   quinapril 20 MG tablet Commonly known as:  ACCUPRIL   quinapril 40 MG tablet Commonly known as:  ACCUPRIL     TAKE these medications   acetaminophen 500 MG tablet Commonly known as:  TYLENOL Take 500 mg by mouth daily as needed for mild pain.     ALPRAZolam 0.25 MG tablet Commonly known as:  XANAX Take 0.25 mg by mouth 3 (three) times daily as needed for anxiety.   aspirin 81 MG tablet Take 81 mg by mouth daily.   atorvastatin 80 MG tablet Commonly known as:  LIPITOR Take 1 tablet (80 mg total) by mouth daily. What changed:    See the new instructions.  Another medication with the same name was removed. Continue taking this medication, and follow the directions you see here.   Azelastine HCl 0.15 % Soln Place 1 spray into both nostrils daily.   carvedilol 6.25 MG tablet Commonly known as:  COREG Take 1 tablet (6.25 mg total) by mouth 2 (two) times daily. What changed:    medication strength  how much to take   cholecalciferol 1000 units tablet Commonly known as:  VITAMIN D Take 1,000 Units by mouth daily.   CLEAR EYES ALL SEASONS OP Place 1 drop into both eyes daily as needed (for dry eyes).   clopidogrel 75 MG tablet Commonly known as:  PLAVIX Take 75 mg by mouth daily with breakfast.   desoximetasone 0.25 % cream Commonly known as:  TOPICORT Apply 1 application topically once a week.  ezetimibe 10 MG tablet Commonly known as:  ZETIA TAKE 1 TABLET BY MOUTH  DAILY   fluticasone 50 MCG/ACT nasal spray Commonly known as:  FLONASE Place 2 sprays into the nose daily. For nasal congestion   furosemide 40 MG tablet Commonly known as:  LASIX Take 1 tablet (40 mg total) by mouth daily. Start taking on:  March 25, 2018   isosorbide mononitrate 30 MG 24 hr tablet Commonly known as:  IMDUR Take 1 tablet (30 mg total) by mouth daily. What changed:    how much to take  additional instructions   metFORMIN 1000 MG tablet Commonly known as:  GLUCOPHAGE Take 1,000 mg by mouth 2 (two) times daily with a meal.   nitroGLYCERIN 0.4 MG SL tablet Commonly known as:  NITROSTAT Place 0.4 mg under the tongue every 5 (five) minutes x 3 doses as needed. For chest pain   potassium chloride SA 20 MEQ  tablet Commonly known as:  K-DUR,KLOR-CON Take 1 tablet (20 mEq total) by mouth daily.   ranolazine 500 MG 12 hr tablet Commonly known as:  RANEXA TAKE 1 TABLET BY MOUTH TWO  TIMES DAILY   sacubitril-valsartan 24-26 MG Commonly known as:  ENTRESTO Take 1 tablet by mouth 2 (two) times daily.   sitaGLIPtin 100 MG tablet Commonly known as:  JANUVIA Take 100 mg by mouth daily.   VICKS VAPOR INHALER IN Inhale into the lungs daily as needed (for wheezing).        Acute coronary syndrome (MI, NSTEMI, STEMI, etc) this admission?: No.    Outstanding Labs/Studies   BMET in 1-2 weeks  Duration of Discharge Encounter   Greater than 30 minutes including physician time.  Hilbert Corrigan, PA 03/24/2018, 3:25 PM

## 2018-03-26 ENCOUNTER — Telehealth: Payer: Self-pay | Admitting: *Deleted

## 2018-03-26 NOTE — Telephone Encounter (Signed)
   Primary Cardiologist:  Sinclair Grooms, MD   Patient contacted.  The patient sees Dr. Fletcher Anon for vascular. History reviewed.  No symptoms to suggest any unstable cardiac conditions.  Based on discussion, with current pandemic situation, we will be postponing this appointment for the patient.  If symptoms change, he has been instructed to contact our office.   Routing to C19 CANCEL pool for tracking (P CV DIV CV19 CANCEL) and assigning priority (1 = 4-6 wks, 2 = 6-12 wks, 3 = >12 wks).  Ricci Barker, RN  03/26/2018 2:10 PM         .

## 2018-03-27 ENCOUNTER — Ambulatory Visit: Payer: Medicare Other | Admitting: Cardiovascular Disease

## 2018-03-27 ENCOUNTER — Ambulatory Visit (HOSPITAL_COMMUNITY): Payer: Medicare Other

## 2018-03-27 ENCOUNTER — Ambulatory Visit (HOSPITAL_COMMUNITY)
Admission: RE | Admit: 2018-03-27 | Payer: Medicare Other | Source: Ambulatory Visit | Attending: Cardiovascular Disease | Admitting: Cardiovascular Disease

## 2018-04-10 ENCOUNTER — Other Ambulatory Visit: Payer: Self-pay

## 2018-04-10 ENCOUNTER — Other Ambulatory Visit: Payer: Medicare Other

## 2018-04-10 DIAGNOSIS — I5023 Acute on chronic systolic (congestive) heart failure: Secondary | ICD-10-CM

## 2018-04-10 LAB — BASIC METABOLIC PANEL
BUN/Creatinine Ratio: 15 (ref 10–24)
BUN: 14 mg/dL (ref 8–27)
CO2: 21 mmol/L (ref 20–29)
Calcium: 9.9 mg/dL (ref 8.6–10.2)
Chloride: 94 mmol/L — ABNORMAL LOW (ref 96–106)
Creatinine, Ser: 0.91 mg/dL (ref 0.76–1.27)
GFR calc Af Amer: 99 mL/min/{1.73_m2} (ref >59)
GFR calc non Af Amer: 86 mL/min/{1.73_m2} (ref >59)
Glucose: 120 mg/dL — ABNORMAL HIGH (ref 65–99)
Potassium: 5.1 mmol/L (ref 3.5–5.2)
Sodium: 128 mmol/L — ABNORMAL LOW (ref 134–144)

## 2018-04-11 ENCOUNTER — Other Ambulatory Visit: Payer: Medicare Other

## 2018-04-13 ENCOUNTER — Other Ambulatory Visit: Payer: Self-pay

## 2018-04-13 DIAGNOSIS — I5023 Acute on chronic systolic (congestive) heart failure: Secondary | ICD-10-CM

## 2018-04-13 NOTE — Progress Notes (Signed)
Orders placed per Almyra Deforest, PA-C

## 2018-04-13 NOTE — Progress Notes (Signed)
Virtual Visit via Video Note   This visit type was conducted due to national recommendations for restrictions regarding the COVID-19 Pandemic (e.g. social distancing) in an effort to limit this patient's exposure and mitigate transmission in our community.  Due to his co-morbid illnesses, this patient is at least at moderate risk for complications without adequate follow up.  This format is felt to be most appropriate for this patient at this time.  All issues noted in this document were discussed and addressed.  A limited physical exam was performed with this format.  Please refer to the patient's chart for his consent to telehealth for [facility].   Evaluation Performed:  Follow-up visit, post hospital follow-up  Date:  04/16/2018   ID:  Ancil Dewan, DOB 03-Sep-1950, MRN [id number]  Patient Location: Home  Provider Location: Home  PCP:  [name], MD  Cardiologist:  Sinclair Grooms, MD   Chief Complaint: Post hospital follow up   History of Present Illness:    Tobechukwu Emmick is a 68 y.o. male who presents via audio/video conferencing for a telehealth visit today for post hospital follow-up, seen for Dr. Tamala Julian.    Mr. Dhondt was admitted to the hospital 03/20/2018 for chest pain and CHF exacerbation. On hospital admission he reported that he was in his usual state of health until day of presentation when he began having shortness of breath while getting ready for bed which developed into chest pain described as a pressure with radiation into his neck. He called EMS and was given 6 SL NTG and Nitropaste with some improvement in chest pain.  He reported that it was not similar to his prior anginal episodes.  He was mildly hypoxic with wheezing on ED arrival with CXR which revealed CHF with an elevated BNP at 300.  He was given IV Lasix and started on heparin and NTG gtt.  EKG showed marked ischemic ST depression in the inferior and precordial leads. Given this, he underwent a cardiac  catheterization on 03/20/2018 which revealed patent LIMA to LAD, patent free radial artery to OM1, severe stenosis in the native vessel distal to the graft which is chronic, patent SVG to PDA, occluded SVG to diagonal, elevated LVEDP of 25 mmHg. When compared to prior cath in 2016, the left main was noted to be newly occluded and SVG to OM1 was also occluded. Otherwise his graft status was unchanged. There was no target for revascularization per cath report and medical therapy was recommended.    Echocardiogram obtained on 03/20/2018 showed EF 25 to 30%, severe global hypokinesis, grade 1 DD, mild MR. Home Norvasc was reduced to uptitrate heart failure regimen.  Delene Loll was started on 3/18.  Accupril was discontinued.  Patient was discharged on 03/24/2018 with a dry weight of 171lbs.   He was diuresed a total of 7 L prior to discharge.  He was fitted for a LifeVest given his reduced LV function.  Plan was for BMET assessment after discharge as well as up titration of Entresto in follow-up. He was continued on ASA 81, atorvastatin 80 mg, carvedilol 6.25 mg, Plavix 75, Zetia 10, Lasix 40 daily, Imdur 30 mg, Ranexa 500, Entresto 24-26.  He was discharged 03/24/2018. Last BMET performed 04/10/2018 with creatinine at 0.91, sodium 128 (at baseline), and potassium 5.1.  Today, Mr. Shamoon reports that he is feeling well.  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  He gets help from his son and his grandson for heavy lifting.  He has some dyspnea after a while and states he goes inside to sit down for a little bit and then can return to working with no problem.  He has been taking his blood pressure and weight daily with only mild fluctuations.  He is down 6 pounds from hospital discharge and has been stable.  BP ranges from 578-469 systolically.  He would like to get back to walking on his treadmill however is waiting just a little more  time to do this.  His kidney function was stable with a creatinine of 0.91 after the initiation of Entresto.  He continues to be compliant with his LifeVest and states it is only cumbersome at night while sleeping.  He has no other complaints.  Denies chest pain, palpitations, LE swelling, orthopnea symptoms, presyncopal or syncopal episodes.  He states his wife does most of the cooking and is very compliant with a low salt diet.  Overall he is doing very well.  The patient does not have symptoms concerning for COVID-19 infection (fever, chills, cough, or new shortness of breath).   Past Medical History:  Diagnosis Date   Chest pain    a. experience weekly, nitrate responsive c/p x several years.   b. ranexa added on 08/2014 admission    Chronic combined systolic and diastolic CHF (congestive heart failure) ([place])    Claudication ([place])    a. 08/2014 ABI: R 0.79, L 1.0-->Walking program instituted.   Coronary artery disease    a. s/p MI in 1989;  b. 04/2008 CABG x 5 (LIMA->LAD, VG->D1, VG->RI, VG->PDA, L Radial->OM2);  c. 06/2010 PCI/DES to the Diag (2.25x28 Promus Element DES). VG->Diag occluded;  d. 08/2010 Attempted PTCA of the OM2 via the free radial graft. Unsuccessful.  Lesion felt to be 2/2 suture.  c. NSTEMI s/p [facility] with severe native and graph dz; D1 that was stented in 2012 now occulded: Rx therapy recommended   Coronary artery disease involving native coronary artery of native heart with angina pectoris ([place])    a. s/p MI in 1989;  b. 04/2008 CABG x 5 (LIMA->LAD, VG->D1, VG->RI, VG->PDA, L Radial->OM2);  c. 06/2010 PCI/DES to the Diag (2.25x28 Promus Element DES). VG->Diag occluded;  d. 08/2010 Attempted PTCA of the OM2 via the free radial graft. Unsuccessful.  Lesion felt to be 2/2 suture.  c. NSTEMI s/p [facility] with severe native and graph dz; Rx therapy recommended    Diabetes mellitus ([place]) 06/02/2011   Hyperlipidemia    Hypertension    NSTEMI (non-ST elevated myocardial infarction) ([place])  08/29/2014   Peripheral arterial disease ([place]) 08/16/2014   Type II diabetes mellitus ([place])    Past Surgical History:  Procedure Laterality Date   CARDIAC CATHETERIZATION N/A 08/28/2014   Procedure: Left Heart Cath and Cors/Grafts Angiography;  Surgeon: Belva Crome, MD;  Location: [place] [facility];  Service: Cardiovascular;  Laterality: N/A;   COLONOSCOPY     CORONARY ANGIOPLASTY WITH STENT PLACEMENT     CORONARY ARTERY BYPASS GRAFT     LEFT HEART CATH AND CORS/GRAFTS ANGIOGRAPHY N/A 03/20/2018   Procedure: LEFT HEART CATH AND CORS/GRAFTS ANGIOGRAPHY;  Surgeon: Martinique, Peter M, MD;  Location: [place] [facility];  Service: Cardiovascular;  Laterality: N/A;   PERIPHERAL VASCULAR CATHETERIZATION N/A 07/15/2015   Procedure: Abdominal Aortogram w/Lower Extremity;  Surgeon: Wellington Hampshire, MD;  Location: [place] [facility];  Service: Cardiovascular;  Laterality: N/A;   PERIPHERAL VASCULAR CATHETERIZATION Bilateral 07/15/2015   Procedure: Peripheral Vascular Intervention;  Surgeon: Wellington Hampshire, MD;  Location: [place] [facility];  Service: Cardiovascular;  Laterality: Bilateral;  Common Iliacs   SHOULDER ARTHROSCOPY WITH ROTATOR CUFF REPAIR AND SUBACROMIAL DECOMPRESSION Left 05/23/2013   Procedure: LEFT SHOULDER ARTHROSCOPY SUBACROMIAL  DECOMPRESSION DISTAL CLAVICLE RESECTION AND ROTATOR CUFF REPAIR ;  Surgeon: Marin Shutter, MD;  Location: [place];  Service: Orthopedics;  Laterality: Left;   SHOULDER SURGERY Right 7-8 years ago   Dr. Dorna Leitz     Current Meds  Medication Sig   acetaminophen (TYLENOL) 500 MG tablet Take 500 mg by mouth daily as needed for mild pain.   ALPRAZolam (XANAX) 0.25 MG tablet Take 0.25 mg by mouth 3 (three) times daily as needed for anxiety.    Aromatic Inhalants (VICKS VAPOR INHALER IN) Inhale into the lungs daily as needed (for wheezing).    aspirin 81 MG tablet Take 81 mg by mouth daily.    atorvastatin (LIPITOR) 80 MG tablet Take 1 tablet (80  mg total) by mouth daily.   Azelastine HCl 0.15 % SOLN Place 1 spray into both nostrils daily.   carvedilol (COREG) 6.25 MG tablet Take 1 tablet (6.25 mg total) by mouth 2 (two) times daily.   cholecalciferol (VITAMIN D) 1000 UNITS tablet Take 1,000 Units by mouth daily.    clopidogrel (PLAVIX) 75 MG tablet Take 75 mg by mouth daily with breakfast.   desoximetasone (TOPICORT) 0.25 % cream Apply 1 application topically once a week.    ezetimibe (ZETIA) 10 MG tablet TAKE 1 TABLET BY MOUTH  DAILY (Patient taking differently: Take 10 mg by mouth daily. )   fluticasone (FLONASE) 50 MCG/ACT nasal spray Place 2 sprays into the nose daily. For nasal congestion   furosemide (LASIX) 40 MG tablet Take 1 tablet (40 mg total) by mouth daily.   isosorbide mononitrate (IMDUR) 30 MG 24 hr tablet Take 1 tablet (30 mg total) by mouth daily.   metFORMIN (GLUCOPHAGE) 1000 MG tablet Take 1,000 mg by mouth 2 (two) times daily with a meal.   nitroGLYCERIN (NITROSTAT) 0.4 MG SL tablet Place 0.4 mg under the tongue every 5 (five) minutes x 3 doses as needed. For chest pain   Polyvinyl Alcohol-Povidone (CLEAR EYES ALL SEASONS OP) Place 1 drop into both eyes daily as needed (for dry eyes).   ranolazine (RANEXA) 500 MG 12 hr tablet TAKE 1 TABLET BY MOUTH TWO  TIMES DAILY (Patient taking differently: Take 500 mg by mouth 2 (two) times daily. )   sitaGLIPtin (JANUVIA) 100 MG tablet Take 100 mg by mouth daily.    [DISCONTINUED] potassium chloride SA (K-DUR,KLOR-CON) 20 MEQ tablet Take 1 tablet (20 mEq total) by mouth daily.   [DISCONTINUED] sacubitril-valsartan (ENTRESTO) 24-26 MG Take 1 tablet by mouth 2 (two) times daily.     Allergies:   Aldactone [spironolactone]; Percocet [oxycodone-acetaminophen]; Avandia [rosiglitazone]; and Codeine   Social History   Tobacco Use   Smoking status: Former Smoker    Last attempt to quit: 01/04/1987    Years since quitting: 31.3   Smokeless tobacco: Former Systems developer    Substance Use Topics   Alcohol use: Yes    Alcohol/week: 2.0 standard drinks    Types: 2 Cans of beer per week    Comment: a week   Drug use: No     Family Hx: The patient's family history includes Anesthesia problems in his brother; Diabetes in his  brother; Heart Problems in his brother, brother, and sister; Heart attack in his father; Heart disease in his mother; Heart failure in his mother; Hypertension in his brother; Kidney failure in his brother; Lung cancer in his mother.  ROS:   Please see the history of present illness.     All other systems reviewed and are negative.   Prior CV studies:   The following studies were reviewed today:  Cath 03/20/2018  Prox RCA to Dist RCA lesion is 100% stenosed.  Ost LM to Ost LAD lesion is 100% stenosed with 100% stenosed side branch in Ost Cx.  Left radial artery graft was visualized by angiography and is normal in caliber.  1st Mrg lesion is 90% stenosed.  Ost 1st Mrg to 1st Mrg lesion is 100% stenosed.  Ost Ramus to Ramus lesion is 100% stenosed.  Ost 1st Diag lesion is 100% stenosed.  Ost LAD to Prox LAD lesion is 100% stenosed.  SVG graft was visualized by angiography and is normal in caliber.  The graft exhibits mild .  LIMA graft was visualized by angiography and is normal in caliber.  The graft exhibits no disease.  LV end diastolic pressure is mildly elevated.  Origin lesion is 100% stenosed.  1. Native vessel occlusive CAD involving the left main Coronary and RCA 2. Patent LIMA to the LAD 3. Patent free radial artery graft to the OM1. There is severe stenosis in the native vessel distal to the graft that is chronic 4. Patent SVG to PDA 5. Occluded SVG to diagonal 6. Elevated LVEDP 25 mm Hg.   Plan: compared to prior study in 2016 the left main is now occluded. Also the SVG to OM1 no longer supplies retrograde flow to the distal LCx. Graft status is unchanged. No targets for revascularization. The OM  distal to the SVG is too small for PCI. Optimize medical management.    Echo 03/20/2018 IMPRESSIONS   1. The left ventricle has severely reduced systolic function, with an ejection fraction of 25-30%. The cavity size was normal. There is mildly increased left ventricular wall thickness. Left ventricular diastolic Doppler parameters are consistent with  impaired relaxation. Indeterminate filling pressures The E/e' is 8-15. There is incoordinate septal motion. Left ventricular diffuse hypokinesis. 2. The right ventricle has low normal systolic function. The cavity was mildly enlarged. There is no increase in right ventricular wall thickness. 3. The pericardium was not assessed. 4. The mitral valve is degenerative. Mild thickening of the mitral valve leaflet. 5. The aortic valve is tricuspid Mild calcification of the aortic valve. 6. The aortic root and ascending aorta are normal in size and structure. 7. The inferior vena cava was dilated in size with <50% respiratory variability. 8. The interatrial septum was not well visualized.   Labs/Other Tests and Data Reviewed:    EKG:  An ECG dated 03/21/2018 was personally reviewed today and demonstrated:  NSR with ST depression in lateral leads  Recent Labs: 03/20/2018: ALT 24; B Natriuretic Peptide 376.7; TSH 1.664 03/21/2018: Hemoglobin 13.2; Magnesium 1.4; Platelets 143 04/10/2018: BUN 14; Creatinine, Ser 0.91; Potassium 5.1; Sodium 128   Recent Lipid Panel Lab Results  Component Value Date/Time   CHOL 114 03/20/2018 10:01 AM   TRIG 58 03/20/2018 10:01 AM   HDL 47 03/20/2018 10:01 AM   CHOLHDL 2.4 03/20/2018 10:01 AM   LDLCALC 55 03/20/2018 10:01 AM    Wt Readings from Last 3 Encounters:  04/16/18 165 lb (74.8 kg)  03/24/18 171 lb 8.3  oz (77.8 kg)  08/21/17 171 lb 6.4 oz (77.7 kg)     Objective:    Vital Signs:  BP (!) 144/81    Pulse 66    Ht 5' 6.5" (1.689 m)    Wt 165 lb (74.8 kg)    BMI 26.23 kg/m    Patient able  to communicate without shortness of breath or cough.  Appears to have no acute distress during conversation.   ASSESSMENT & PLAN:    1. NSTEMI: -Hx of  remote CABG with mult PCIs who presented to [facility] with NSTEMI that appeared to be secondary to new native artery occlusion of the LAD/LM and/or the retrograde limb of the left circumflex coronary artery without revascularization options.  -His amlodipine was stopped secondary to ischemic heart failure with an LVEF of 25-30%  -He was placed on Life Vest prior to discharge with plans for reevaluation of LV function in 3 months>>> will plan for echocardiogram mid-to-late June with a close follow-up appointment with Dr. 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  -He was transitioned from ACE inhibitor to [facility] during hospital admission>>will increase as above given most recent renal function 04/10/2018.   -Continue carvedilol, PO lasix  -Weight is down 6 pounds since hospital discharge and has been stable at 165lb -Monitor his daily weight and BP>>> urged to continue -Continue sodium restriction and daily weight monitoring and monitor for signs and symptoms of heart failure exacerbation such as orthopnea and PND and exertional dyspnea.  -Remains mildly hyponatremic at 130 (improved from hospitalization) and discussed limiting fluid intake to no more than 2000 mL a day.  3.  HLD: -LDL stable at 55, continue high-dose Lipitor and Zetia -Will recheck in 6-8 weeks   4. DM type 2: -HbA1c, 7.1 03/20/2018 -Consider transition to Jardiance per PCP    COVID-19 Education: The signs and symptoms of COVID-19 were discussed with the patient and how to seek care for testing (follow up with PCP or arrange E-visit). The importance of social distancing was discussed today.  Time:   Today, I have spent 20 minutes with the patient with telehealth technology discussing the above problems.     Medication Adjustments/Labs and Tests Ordered: Current medicines are reviewed at length with the patient today.  Concerns regarding medicines are outlined above.  Tests Ordered: Orders Placed This Encounter  Procedures   Basic metabolic panel   ECHOCARDIOGRAM COMPLETE   Medication Changes: Meds ordered this encounter  Medications   sacubitril-valsartan (ENTRESTO) 49-51 MG    Sig: Take 1 tablet by mouth 2 (two) times daily.    Dispense:  60 tablet    Refill:  2    Disposition:  Follow up Dr. Tamala Julian in 3 months  Signed, Kathyrn Drown, NP  04/16/2018 11:58 AM    [place]

## 2018-04-13 NOTE — Progress Notes (Signed)
The patient has been notified of the result and verbalized understanding.  All questions (if any) were answered. [name] [name], [place] 04/13/2018 4:47 PM

## 2018-04-16 ENCOUNTER — Other Ambulatory Visit: Payer: Self-pay

## 2018-04-16 ENCOUNTER — Encounter: Payer: Self-pay | Admitting: Cardiology

## 2018-04-16 ENCOUNTER — Telehealth (INDEPENDENT_AMBULATORY_CARE_PROVIDER_SITE_OTHER): Payer: Medicare Other | Admitting: Cardiology

## 2018-04-16 VITALS — BP 144/81 | HR 66 | Ht 66.5 in | Wt 165.0 lb

## 2018-04-16 DIAGNOSIS — I255 Ischemic cardiomyopathy: Secondary | ICD-10-CM

## 2018-04-16 DIAGNOSIS — I25810 Atherosclerosis of coronary artery bypass graft(s) without angina pectoris: Secondary | ICD-10-CM

## 2018-04-16 DIAGNOSIS — E1159 Type 2 diabetes mellitus with other circulatory complications: Secondary | ICD-10-CM

## 2018-04-16 DIAGNOSIS — I5023 Acute on chronic systolic (congestive) heart failure: Secondary | ICD-10-CM

## 2018-04-16 DIAGNOSIS — I10 Essential (primary) hypertension: Secondary | ICD-10-CM

## 2018-04-16 MED ORDER — SACUBITRIL-VALSARTAN 49-51 MG PO TABS: 1.0000 | ORAL_TABLET | Freq: Two times a day (BID) | ORAL | 2 refills | Status: DC

## 2018-04-16 NOTE — Patient Instructions (Signed)
Medication Instructions:  INCREASE: Entresto to 49/51 mg twice a day   If you need a refill on your cardiac medications before your next appointment, please call your pharmacy.   Lab work: FUTURE: BMET to be done on  04/30/2018 ( Lab is open from 7:30 to 4:30 you can come anytime between those hours)  If you have labs (blood work) drawn today and your tests are completely normal, you will receive your results only by: [facility] Kitchen MyChart Message (if you have MyChart) OR . A paper copy in the mail If you have any lab test that is abnormal or we need to change your treatment, we will call you to review the results.  Testing/Procedures: Your physician has requested that you have an echocardiogram at the end of June 2020. Echocardiography is a painless test that uses sound waves to create images of your heart. It provides your doctor with information about the size and shape of your heart and how well your heart's chambers and valves are working. This procedure takes approximately one hour. There are no restrictions for this procedure.    Follow-Up: Follow up with Dr. Tamala Julian after you have your echo  Any Other Special Instructions Will Be Listed Below (If Applicable).

## 2018-04-30 ENCOUNTER — Other Ambulatory Visit: Payer: Self-pay

## 2018-04-30 ENCOUNTER — Other Ambulatory Visit: Payer: Medicare Other | Admitting: *Deleted

## 2018-04-30 DIAGNOSIS — I5023 Acute on chronic systolic (congestive) heart failure: Secondary | ICD-10-CM

## 2018-04-30 LAB — BASIC METABOLIC PANEL
BUN/Creatinine Ratio: 11 (ref 10–24)
BUN: 10 mg/dL (ref 8–27)
CO2: 21 mmol/L (ref 20–29)
Calcium: 9.7 mg/dL (ref 8.6–10.2)
Chloride: 97 mmol/L (ref 96–106)
Creatinine, Ser: 0.88 mg/dL (ref 0.76–1.27)
GFR calc Af Amer: 101 mL/min/{1.73_m2} (ref >59)
GFR calc non Af Amer: 88 mL/min/{1.73_m2} (ref >59)
Glucose: 101 mg/dL — ABNORMAL HIGH (ref 65–99)
Potassium: 4.9 mmol/L (ref 3.5–5.2)
Sodium: 131 mmol/L — ABNORMAL LOW (ref 134–144)

## 2018-05-02 ENCOUNTER — Telehealth: Payer: Self-pay | Admitting: Interventional Cardiology

## 2018-05-02 ENCOUNTER — Telehealth: Payer: Self-pay

## 2018-05-02 NOTE — Telephone Encounter (Signed)
Left a voice message for the patient

## 2018-05-02 NOTE — Telephone Encounter (Signed)
New message:    Patient returning call concerning some lab results. Please call patient.

## 2018-05-02 NOTE — Progress Notes (Signed)
Left voice message.

## 2018-05-03 NOTE — Telephone Encounter (Signed)
The patient has been notified of the result and verbalized understanding.  All questions (if any) were answered. Jacqulynn Cadet, CMA 05/03/2018 1:37 PM

## 2018-05-03 NOTE — Progress Notes (Signed)
The patient has been notified of the result and verbalized understanding.  All questions (if any) were answered. Jacqulynn Cadet, CMA 05/03/2018 1:37 PM

## 2018-05-13 ENCOUNTER — Other Ambulatory Visit: Payer: Self-pay | Admitting: Interventional Cardiology

## 2018-05-16 ENCOUNTER — Other Ambulatory Visit: Payer: Self-pay | Admitting: Interventional Cardiology

## 2018-05-16 MED ORDER — CARVEDILOL 6.25 MG PO TABS: 6.2500 mg | ORAL_TABLET | Freq: Two times a day (BID) | ORAL | 3 refills | Status: DC

## 2018-05-24 ENCOUNTER — Other Ambulatory Visit: Payer: Self-pay | Admitting: Interventional Cardiology

## 2018-05-24 ENCOUNTER — Telehealth: Payer: Self-pay | Admitting: Interventional Cardiology

## 2018-05-24 MED ORDER — SACUBITRIL-VALSARTAN 49-51 MG PO TABS: 1.0000 | ORAL_TABLET | Freq: Two times a day (BID) | ORAL | 3 refills | Status: DC

## 2018-05-24 NOTE — Telephone Encounter (Signed)
Attempted to contact pt in regards to appt with Dr. Tamala Julian on 6/1.  Left message to call back.  Needs to be changed to a virtual visit using Doximity if able.  Was going to offer any opened slot on 5/28 or can do 6/1 at 12:30p or 1p, whichever is still available. Please send to me for scheduling.

## 2018-05-25 NOTE — Telephone Encounter (Signed)
Spoke with pt and he was agreeable to do a video visit with Dr. Tamala Julian on 5/28.  Advised CMA will call with more details next week.  Pt verbalized understanding and was appreciative for call.

## 2018-05-30 ENCOUNTER — Telehealth: Payer: Self-pay

## 2018-05-30 NOTE — Progress Notes (Signed)
Virtual Visit via Video Note   This visit type was conducted due to national recommendations for restrictions regarding the COVID-19 Pandemic (e.g. social distancing) in an effort to limit this patient's exposure and mitigate transmission in our community.  Due to his co-morbid illnesses, this patient is at least at moderate risk for complications without adequate follow up.  This format is felt to be most appropriate for this patient at this time.  All issues noted in this document were discussed and addressed.  A limited physical exam was performed with this format.  Please refer to the patient's chart for his consent to telehealth for [facility].   Date:  05/30/2018   ID:  [name] [name], DOB August 25, 1950, MRN [id number]  Patient Location: Home Provider Location: Home  PCP:  [name], MD  Cardiologist:  Sinclair Grooms, MD  Electrophysiologist:  None   Evaluation Performed:  Follow-Up Visit  Chief Complaint:  NSTEMI; Acute on chronic combined systolic and diastolic HF  History of Present Illness:    [name] [name] is a 68 y.o. male with  a hx of CAD, prior history of CABG and percutaneous coronary intervention (most recently 2012), chronic combined systolic and diastolic heart failure (EF at cath 2016 was 40-50%), hypertension, hyperlipidemia, and type 2 diabetes.  Chest pain with exertion, just like before. No orthopnea, PND, or edema. He has LIFE VEST and very aggravated by it.  He is compliant wearing the device greater than 23 hours/day.  Has angina and uses SL NTG with relief.  He is not having medication side effects.  He has visited with his grandchildren.  He is practicing social distancing.  The patient does not have symptoms concerning for COVID-19 infection (fever, chills, cough, or new shortness of breath). Recent hospitalization with refractory angina and CHF.     Past Medical History:  Diagnosis Date  . Chest pain    a. experience weekly, nitrate  responsive c/p x several years.   b. ranexa added on 08/2014 admission   . Chronic combined systolic and diastolic CHF (congestive heart failure) ([place])   . Claudication ([place])    a. 08/2014 ABI: R 0.79, L 1.0-->Walking program instituted.  . Coronary artery disease    a. s/p MI in 1989;  b. 04/2008 CABG x 5 (LIMA->LAD, VG->D1, VG->RI, VG->PDA, L Radial->OM2);  c. 06/2010 PCI/DES to the Diag (2.25x28 Promus Element DES). VG->Diag occluded;  d. 08/2010 Attempted PTCA of the OM2 via the free radial graft. Unsuccessful.  Lesion felt to be 2/2 suture.  c. NSTEMI s/p [facility] with severe native and graph dz; D1 that was stented in 2012 now occulded: Rx therapy recommended  . Coronary artery disease involving native coronary artery of native heart with angina pectoris ([place])    a. s/p MI in 1989;  b. 04/2008 CABG x 5 (LIMA->LAD, VG->D1, VG->RI, VG->PDA, L Radial->OM2);  c. 06/2010 PCI/DES to the Diag (2.25x28 Promus Element DES). VG->Diag occluded;  d. 08/2010 Attempted PTCA of the OM2 via the free radial graft. Unsuccessful.  Lesion felt to be 2/2 suture.  c. NSTEMI s/p [facility] with severe native and graph dz; Rx therapy recommended   . Diabetes mellitus ([place]) 06/02/2011  . Hyperlipidemia   . Hypertension   . NSTEMI (non-ST elevated myocardial infarction) ([place]) 08/29/2014  . Peripheral arterial disease ([place]) 08/16/2014  . Type II diabetes mellitus ([place])    Past Surgical History:  Procedure Laterality Date  . CARDIAC CATHETERIZATION N/A 08/28/2014   Procedure: Left Heart  Cath and Cors/Grafts Angiography;  Surgeon: Belva Crome, MD;  Location: [place] [facility];  Service: Cardiovascular;  Laterality: N/A;  . COLONOSCOPY    . CORONARY ANGIOPLASTY WITH STENT PLACEMENT    . CORONARY ARTERY BYPASS GRAFT    . LEFT HEART CATH AND CORS/GRAFTS ANGIOGRAPHY N/A 03/20/2018   Procedure: LEFT HEART CATH AND CORS/GRAFTS ANGIOGRAPHY;  Surgeon: Martinique, Peter M, MD;  Location: [place] CV [facility];  Service: Cardiovascular;  Laterality: N/A;   . PERIPHERAL VASCULAR CATHETERIZATION N/A 07/15/2015   Procedure: Abdominal Aortogram w/Lower Extremity;  Surgeon: Wellington Hampshire, MD;  Location: [place] CV [facility];  Service: Cardiovascular;  Laterality: N/A;  . PERIPHERAL VASCULAR CATHETERIZATION Bilateral 07/15/2015   Procedure: Peripheral Vascular Intervention;  Surgeon: Wellington Hampshire, MD;  Location: [place] [facility];  Service: Cardiovascular;  Laterality: Bilateral;  Common Iliacs  . SHOULDER ARTHROSCOPY WITH ROTATOR CUFF REPAIR AND SUBACROMIAL DECOMPRESSION Left 05/23/2013   Procedure: LEFT SHOULDER ARTHROSCOPY SUBACROMIAL  DECOMPRESSION DISTAL CLAVICLE RESECTION AND ROTATOR CUFF REPAIR ;  Surgeon: Marin Shutter, MD;  Location: [place];  Service: Orthopedics;  Laterality: Left;  . SHOULDER SURGERY Right 7-8 years ago   Dr. Dorna Leitz     No outpatient medications have been marked as taking for the 05/31/18 encounter (Appointment) with Belva Crome, MD.     Allergies:   Aldactone [spironolactone]; Percocet [oxycodone-acetaminophen]; Avandia [rosiglitazone]; and Codeine   Social History   Tobacco Use  . Smoking status: Former Smoker    Last attempt to quit: 01/04/1987    Years since quitting: 31.4  . Smokeless tobacco: Former Network engineer Use Topics  . Alcohol use: Yes    Alcohol/week: 2.0 standard drinks    Types: 2 Cans of beer per week    Comment: a week  . Drug use: No     Family Hx: The patient's family history includes Anesthesia problems in his brother; Diabetes in his brother; Heart Problems in his brother, brother, and sister; Heart attack in his father; Heart disease in his mother; Heart failure in his mother; Hypertension in his brother; Kidney failure in his brother; Lung cancer in his mother.  ROS:   Please see the history of present illness.    Stamina has been somewhat decreased.  Appetite is improved.  Symptoms only occur with exertion. All other systems reviewed and are negative.   Prior CV studies:    The following studies were reviewed today:  CATH 03/2018: 1. Native vessel occlusive CAD involving the left main Coronary and RCA 2. Patent LIMA to the LAD 3. Patent free radial artery graft to the OM1. There is severe stenosis in the native vessel distal to the graft that is chronic 4. Patent SVG to PDA 5. Occluded SVG to diagonal 6. Elevated LVEDP 25 mm Hg.   Plan: compared to prior study in 2016 the left main is now occluded. Also the SVG to OM1 no longer supplies retrograde flow to the distal LCx. Graft status is unchanged.  No targets for revascularization. The OM distal to the SVG is too small for PCI. Optimize medical management.   ECHOCARDIOGRAM 03/2018: IMPRESSIONS    1. The left ventricle has severely reduced systolic function, with an ejection fraction of 25-30%. The cavity size was normal. There is mildly increased left ventricular wall thickness. Left ventricular diastolic Doppler parameters are consistent with  impaired relaxation. Indeterminate filling pressures The E/e' is 8-15. There is incoordinate septal motion. Left ventricular diffuse hypokinesis.  2. The right ventricle has low normal systolic function. The cavity was mildly enlarged. There is no increase in right ventricular wall thickness.  3. The pericardium was not assessed.  4. The mitral valve is degenerative. Mild thickening of the mitral valve leaflet.  5. The aortic valve is tricuspid Mild calcification of the aortic valve.  6. The aortic root and ascending aorta are normal in size and structure.  7. The inferior vena cava was dilated in size with <50% respiratory variability.  8. The interatrial septum was not well visualized.  SUMMARY   LVEF 25-30%, mild LVH, severe global hypokinesis, grade 1 DD, indeterminate LV filling pressure, low normal RV systolic function with mild RVE, mild MR, calcified aortic valve without stenosis, dilated IVC   Labs/Other Tests and Data Reviewed:    EKG:  No ECG  reviewed.  Recent Labs: 03/20/2018: ALT 24; B Natriuretic Peptide 376.7; TSH 1.664 03/21/2018: Hemoglobin 13.2; Magnesium 1.4; Platelets 143 04/30/2018: BUN 10; Creatinine, Ser 0.88; Potassium 4.9; Sodium 131   Recent Lipid Panel Lab Results  Component Value Date/Time   CHOL 114 03/20/2018 10:01 AM   TRIG 58 03/20/2018 10:01 AM   HDL 47 03/20/2018 10:01 AM   CHOLHDL 2.4 03/20/2018 10:01 AM   LDLCALC 55 03/20/2018 10:01 AM    Wt Readings from Last 3 Encounters:  04/16/18 165 lb (74.8 kg)  03/24/18 171 lb 8.3 oz (77.8 kg)  08/21/17 171 lb 6.4 oz (77.7 kg)     Objective:    Vital Signs:  There were no vitals taken for this visit.   VITAL SIGNS:  reviewed  ASSESSMENT & PLAN:    1. Coronary artery disease involving native coronary artery of native heart with angina pectoris ([place])   2. Essential hypertension   3. Chronic combined systolic and diastolic CHF (congestive heart failure) ([place])   4. Type 2 diabetes mellitus with other circulatory complication, without long-term current use of insulin ([facility])   5. Other hyperlipidemia    PLAN:  1. Secondary prevention discussed.  Prophylactic use of sublingual nitroglycerin discussed. 2. Target blood pressure is still above ideal.  Depending upon echocardiogram in late June, further medication adjustment will be made.  Perhaps the best option will be to increase Entresto to the 200 mg twice daily tablet. 3. On guideline directed therapy.  Consider adding a mineralocorticoid antagonist.  Consider further up titration of Entresto. 4. Hemoglobin A1c less than 7.  While hospitalized it was 7.1.  Consider SGLT2 therapy.  Will discuss this with primary.  Overall education and awareness concerning primary/secondary risk prevention was discussed in detail: LDL less than 70, hemoglobin A1c less than 7, blood pressure target less than 130/80 mmHg, >150 minutes of moderate aerobic activity per week, avoidance of smoking, weight control (via diet and  exercise), and continued surveillance/management of/for obstructive sleep apnea.  Target BP: <130/80 mmHg  Diet and lifestyle measures for BP control were reviewed in detail: Low sodium diet (<2.5 gm daily); alcohol restriction (<3 ounces per day); weight loss (Mediterranean); avoid non-steroidal agents; > 6 hours sleep per day; 150 min moderate exercise per week.  Final decision about pacemaker/defibrillator after June echocardiogram repeated.  COVID-19 Education: The signs and symptoms of COVID-19 were discussed with the patient and how to seek care for testing (follow up with PCP or arrange E-visit).  The importance of social distancing was discussed today.  Time:   Today, I have spent 15 minutes with the patient with telehealth technology discussing the above problems.  Medication Adjustments/Labs and Tests Ordered: Current medicines are reviewed at length with the patient today.  Concerns regarding medicines are outlined above.   Tests Ordered: No orders of the defined types were placed in this encounter.   Medication Changes: No orders of the defined types were placed in this encounter.   Disposition:  Follow up in 3 month(s)  Signed, Sinclair Grooms, MD  05/30/2018 5:15 PM    [facility]

## 2018-05-30 NOTE — Telephone Encounter (Signed)
YOUR CARDIOLOGY TEAM HAS ARRANGED FOR AN E-VISIT FOR YOUR APPOINTMENT - PLEASE REVIEW IMPORTANT INFORMATION BELOW SEVERAL DAYS PRIOR TO YOUR APPOINTMENT  Due to the recent COVID-19 pandemic, we are transitioning in-person office visits to tele-medicine visits in an effort to decrease unnecessary exposure to our patients, their families, and staff. These visits are billed to your insurance just like a normal visit is. We also encourage you to sign up for MyChart if you have not already done so. You will need a smartphone if possible. For patients that do not have this, we can still complete the visit using a regular telephone but do prefer a smartphone to enable video when possible. You may have a family member that lives with you that can help. If possible, we also ask that you have a blood pressure cuff and scale at home to measure your blood pressure, heart rate and weight prior to your scheduled appointment. Patients with clinical needs that need an in-person evaluation and testing will still be able to come to the office if absolutely necessary. If you have any questions, feel free to call our office.     YOUR PROVIDER WILL BE USING THE FOLLOWING PLATFORM TO COMPLETE YOUR VISIT: Doximity  . IF USING MYCHART - How to Download the MyChart App to Your SmartPhone   - If Apple, go to App Store and type in MyChart in the search bar and download the app. If Android, ask patient to go to Google Play Store and type in MyChart in the search bar and download the app. The app is free but as with any other app downloads, your phone may require you to verify saved payment information or Apple/Android password.  - You will need to then log into the app with your MyChart username and password, and select [facility] as your healthcare provider to link the account.  - When it is time for your visit, go to the MyChart app, find appointments, and click Begin Video Visit. Be sure to Select Allow for your device to  access the Microphone and Camera for your visit. You will then be connected, and your provider will be with you shortly.  **If you have any issues connecting or need assistance, please contact MyChart service desk [phone] ([phone])**  **If using a computer, in order to ensure the best quality for your visit, you will need to use either of the following Internet Browsers: Google Chrome or Microsoft Edge**  . IF USING DOXIMITY or [email or web address].ME - The staff will give you instructions on receiving your link to join the meeting the day of your visit.      2-3 DAYS BEFORE YOUR APPOINTMENT  You will receive a telephone call from one of our HeartCare team members - your caller ID may say "Unknown caller." If this is a video visit, we will walk you through how to get the video launched on your phone. We will remind you check your blood pressure, heart rate and weight prior to your scheduled appointment. If you have an Apple Watch or Kardia, please upload any pertinent ECG strips the day before or morning of your appointment to MyChart. Our staff will also make sure you have reviewed the consent and agree to move forward with your scheduled tele-health visit.     THE DAY OF YOUR APPOINTMENT  Approximately 15 minutes prior to your scheduled appointment, you will receive a telephone call from one of HeartCare team - your caller ID may say "Unknown caller."    Our staff will confirm medications, vital signs for the day and any symptoms you may be experiencing. Please have this information available prior to the time of visit start. It may also be helpful for you to have a pad of paper and pen handy for any instructions given during your visit. They will also walk you through joining the smartphone meeting if this is a video visit.    CONSENT FOR TELE-HEALTH VISIT - PLEASE REVIEW  I hereby voluntarily request, consent and authorize [facility] and its employed or contracted physicians, physician  assistants, nurse practitioners or other licensed health care professionals (the Practitioner), to provide me with telemedicine health care services (the "Services") as deemed necessary by the treating Practitioner. I acknowledge and consent to receive the Services by the Practitioner via telemedicine. I understand that the telemedicine visit will involve communicating with the Practitioner through live audiovisual communication technology and the disclosure of certain medical information by electronic transmission. I acknowledge that I have been given the opportunity to request an in-person assessment or other available alternative prior to the telemedicine visit and am voluntarily participating in the telemedicine visit.  I understand that I have the right to withhold or withdraw my consent to the use of telemedicine in the course of my care at any time, without affecting my right to future care or treatment, and that the Practitioner or I may terminate the telemedicine visit at any time. I understand that I have the right to inspect all information obtained and/or recorded in the course of the telemedicine visit and may receive copies of available information for a reasonable fee.  I understand that some of the potential risks of receiving the Services via telemedicine include:  . Delay or interruption in medical evaluation due to technological equipment failure or disruption; . Information transmitted may not be sufficient (e.g. poor resolution of images) to allow for appropriate medical decision making by the Practitioner; and/or  . In rare instances, security protocols could fail, causing a breach of personal health information.  Furthermore, I acknowledge that it is my responsibility to provide information about my medical history, conditions and care that is complete and accurate to the best of my ability. I acknowledge that Practitioner's advice, recommendations, and/or decision may be based on  factors not within their control, such as incomplete or inaccurate data provided by me or distortions of diagnostic images or specimens that may result from electronic transmissions. I understand that the practice of medicine is not an exact science and that Practitioner makes no warranties or guarantees regarding treatment outcomes. I acknowledge that I will receive a copy of this consent concurrently upon execution via email to the email address I last provided but may also request a printed copy by calling the office of [facility].    I understand that my insurance will be billed for this visit.   I have read or had this consent read to me. . I understand the contents of this consent, which adequately explains the benefits and risks of the Services being provided via telemedicine.  . I have been provided ample opportunity to ask questions regarding this consent and the Services and have had my questions answered to my satisfaction. . I give my informed consent for the services to be provided through the use of telemedicine in my medical care  By participating in this telemedicine visit I agree to the above.  

## 2018-05-31 ENCOUNTER — Other Ambulatory Visit: Payer: Self-pay

## 2018-05-31 ENCOUNTER — Telehealth (INDEPENDENT_AMBULATORY_CARE_PROVIDER_SITE_OTHER): Payer: Medicare Other | Admitting: Interventional Cardiology

## 2018-05-31 ENCOUNTER — Encounter: Payer: Self-pay | Admitting: Interventional Cardiology

## 2018-05-31 VITALS — BP 136/83 | HR 80 | Ht 66.5 in | Wt 169.0 lb

## 2018-05-31 DIAGNOSIS — I25119 Atherosclerotic heart disease of native coronary artery with unspecified angina pectoris: Secondary | ICD-10-CM | POA: Diagnosis not present

## 2018-05-31 DIAGNOSIS — I5042 Chronic combined systolic (congestive) and diastolic (congestive) heart failure: Secondary | ICD-10-CM

## 2018-05-31 DIAGNOSIS — I110 Hypertensive heart disease with heart failure: Secondary | ICD-10-CM

## 2018-05-31 DIAGNOSIS — I252 Old myocardial infarction: Secondary | ICD-10-CM | POA: Diagnosis not present

## 2018-05-31 DIAGNOSIS — E7849 Other hyperlipidemia: Secondary | ICD-10-CM

## 2018-05-31 DIAGNOSIS — Z7189 Other specified counseling: Secondary | ICD-10-CM

## 2018-05-31 DIAGNOSIS — E1159 Type 2 diabetes mellitus with other circulatory complications: Secondary | ICD-10-CM

## 2018-05-31 DIAGNOSIS — I10 Essential (primary) hypertension: Secondary | ICD-10-CM

## 2018-05-31 NOTE — Patient Instructions (Signed)
Medication Instructions:  Your physician recommends that you continue on your current medications as directed. Please refer to the Current Medication list given to you today.  If you need a refill on your cardiac medications before your next appointment, please call your pharmacy.   Lab work: BMP and BNP same day as echo  If you have labs (blood work) drawn today and your tests are completely normal, you will receive your results only by: Marland Kitchen MyChart Message (if you have MyChart) OR . A paper copy in the mail If you have any lab test that is abnormal or we need to change your treatment, we will call you to review the results.  Testing/Procedures: Keep appointment for echo on 6/29.  Follow-Up: At [facility], you and your health needs are our priority.  As part of our continuing mission to provide you with exceptional heart care, we have created designated Provider Care Teams.  These Care Teams include your primary Cardiologist (physician) and Advanced Practice Providers (APPs -  Physician Assistants and Nurse Practitioners) who all work together to provide you with the care you need, when you need it. You will need a follow up appointment in 3 months.  Please call our office 2 months in advance to schedule this appointment.  You may see Sinclair Grooms, MD or one of the following Advanced Practice Providers on your designated Care Team:   Truitt Merle, NP Cecilie Kicks, NP . Kathyrn Drown, NP  Any Other Special Instructions Will Be Listed Below (If Applicable).

## 2018-06-04 ENCOUNTER — Ambulatory Visit: Payer: Medicare Other | Admitting: Interventional Cardiology

## 2018-06-09 ENCOUNTER — Other Ambulatory Visit: Payer: Self-pay | Admitting: Cardiovascular Disease

## 2018-06-11 NOTE — Telephone Encounter (Signed)
Please review for refill. Thanks!  

## 2018-06-25 ENCOUNTER — Other Ambulatory Visit: Payer: Self-pay

## 2018-06-25 MED ORDER — FUROSEMIDE 40 MG PO TABS: 40.0000 mg | ORAL_TABLET | Freq: Every day | ORAL | 2 refills | Status: DC

## 2018-06-28 ENCOUNTER — Telehealth: Payer: Self-pay | Admitting: *Deleted

## 2018-06-28 NOTE — Telephone Encounter (Signed)
    COVID-19 Pre-Screening Questions:  . In the past 7 to 10 days have you had a cough,  shortness of breath, headache, congestion, fever (100 or greater) body aches, chills, sore throat, or sudden loss of taste or sense of smell? . Have you been around anyone with known Covid 19. . Have you been around anyone who is awaiting Covid 19 test results in the past 7 to 10 days? . Have you been around anyone who has been exposed to Covid 19, or has mentioned symptoms of Covid 19 within the past 7 to 10 days?  If you have any concerns/questions about symptoms patients report during screening (either on the phone or at threshold). Contact the provider seeing the patient or DOD for further guidance.  If neither are available contact a member of the leadership team.           Contacted patient via phone call. Answered all Covid 19 questions NO. Has a mask. KB

## 2018-06-29 ENCOUNTER — Telehealth (HOSPITAL_COMMUNITY): Payer: Self-pay | Admitting: *Deleted

## 2018-06-29 NOTE — Telephone Encounter (Signed)
Detailed message per DPR containing the following...  COVID-19 Pre-Screening Questions:  . Do you currently have a fever? msg (yes = cancel and refer to pcp for e-visit) . Have you recently travelled on a cruise, internationally, or to [place], [place], [place], [place], [place], or [place], [place] [employer]) ? msg (yes = cancel, stay home, monitor symptoms, and contact pcp or initiate e-visit if symptoms develop) . Have you been in contact with someone that is currently pending confirmation of Covid19 testing or has been confirmed to have the Covid19 virus?  msg (yes = cancel, stay home, away from tested individual, monitor symptoms, and contact pcp or initiate e-visit if symptoms develop) . Are you currently experiencing fatigue or cough? msg (yes = pt should be prepared to have a mask placed at the time of their visit).   . Reiterated no additional visitors. Eartha Inch no earlier than 15 minutes before appointment time. . Please bring own mask.  Sharone Picchi

## 2018-07-02 ENCOUNTER — Other Ambulatory Visit: Payer: Self-pay

## 2018-07-02 ENCOUNTER — Telehealth: Payer: Self-pay | Admitting: Interventional Cardiology

## 2018-07-02 ENCOUNTER — Other Ambulatory Visit: Payer: Medicare Other

## 2018-07-02 ENCOUNTER — Ambulatory Visit (HOSPITAL_COMMUNITY): Payer: Medicare Other | Attending: Cardiology

## 2018-07-02 DIAGNOSIS — I255 Ischemic cardiomyopathy: Secondary | ICD-10-CM | POA: Insufficient documentation

## 2018-07-02 DIAGNOSIS — I5042 Chronic combined systolic (congestive) and diastolic (congestive) heart failure: Secondary | ICD-10-CM

## 2018-07-02 LAB — PRO B NATRIURETIC PEPTIDE: NT-Pro BNP: 854 pg/mL — ABNORMAL HIGH (ref 0–376)

## 2018-07-02 LAB — BASIC METABOLIC PANEL
BUN/Creatinine Ratio: 11 (ref 10–24)
BUN: 11 mg/dL (ref 8–27)
CO2: 21 mmol/L (ref 20–29)
Calcium: 9.9 mg/dL (ref 8.6–10.2)
Chloride: 95 mmol/L — ABNORMAL LOW (ref 96–106)
Creatinine, Ser: 1.02 mg/dL (ref 0.76–1.27)
GFR calc Af Amer: 86 mL/min/{1.73_m2} (ref >59)
GFR calc non Af Amer: 74 mL/min/{1.73_m2} (ref >59)
Glucose: 118 mg/dL — ABNORMAL HIGH (ref 65–99)
Potassium: 4.9 mmol/L (ref 3.5–5.2)
Sodium: 128 mmol/L — ABNORMAL LOW (ref 134–144)

## 2018-07-02 NOTE — Telephone Encounter (Signed)
New Message    Courtney from [place] life vest calling stating patient still wearing life vest and the order has expired and she needs a new one as well as his last chart note faxed over to [phone].

## 2018-07-02 NOTE — Telephone Encounter (Signed)
Pt in today for repeat echo.  Will route to Dr. Tamala Julian to see if pt needs to continue Life Vest.

## 2018-07-03 NOTE — Telephone Encounter (Signed)
Continue LifeVest.

## 2018-07-03 NOTE — Telephone Encounter (Signed)
Clarified with Dr. Tamala Julian and per echo, ok for pt to d/c Lifevest.  Spoke with Loma Sousa and made her aware.  Loma Sousa thanked me for the call.

## 2018-07-04 ENCOUNTER — Other Ambulatory Visit: Payer: Self-pay | Admitting: *Deleted

## 2018-07-04 DIAGNOSIS — I5042 Chronic combined systolic (congestive) and diastolic (congestive) heart failure: Secondary | ICD-10-CM

## 2018-07-04 MED ORDER — SACUBITRIL-VALSARTAN 97-103 MG PO TABS: 1.0000 | ORAL_TABLET | Freq: Two times a day (BID) | ORAL | 3 refills | Status: DC

## 2018-07-11 ENCOUNTER — Other Ambulatory Visit: Payer: Medicare Other

## 2018-07-11 ENCOUNTER — Other Ambulatory Visit: Payer: Self-pay

## 2018-07-11 DIAGNOSIS — I10 Essential (primary) hypertension: Secondary | ICD-10-CM

## 2018-07-11 DIAGNOSIS — I5042 Chronic combined systolic (congestive) and diastolic (congestive) heart failure: Secondary | ICD-10-CM

## 2018-07-11 LAB — BASIC METABOLIC PANEL
BUN/Creatinine Ratio: 9 — ABNORMAL LOW (ref 10–24)
BUN: 9 mg/dL (ref 8–27)
CO2: 20 mmol/L (ref 20–29)
Calcium: 9.4 mg/dL (ref 8.6–10.2)
Chloride: 94 mmol/L — ABNORMAL LOW (ref 96–106)
Creatinine, Ser: 0.97 mg/dL (ref 0.76–1.27)
GFR calc Af Amer: 91 mL/min/{1.73_m2} (ref >59)
GFR calc non Af Amer: 79 mL/min/{1.73_m2} (ref >59)
Glucose: 144 mg/dL — ABNORMAL HIGH (ref 65–99)
Potassium: 5 mmol/L (ref 3.5–5.2)
Sodium: 126 mmol/L — ABNORMAL LOW (ref 134–144)

## 2018-07-23 ENCOUNTER — Telehealth: Payer: Self-pay | Admitting: Interventional Cardiology

## 2018-07-23 NOTE — Telephone Encounter (Signed)

## 2018-07-23 NOTE — Progress Notes (Signed)
Cardiology Office Note:    Date:  07/24/2018   ID:  [name] [name], DOB 01/19/1950, MRN [id number]  PCP:  [name], MD  Cardiologist:  Sinclair Grooms, MD   Referring MD: [name], MD   Chief Complaint  Patient presents with  . Coronary Artery Disease  . Congestive Heart Failure    History of Present Illness:    [name] [name] is a 68 y.o. male with a hx of CAD, prior history of CABG and percutaneous coronary intervention (most recently 2012), chronic combined systolic and diastolic heart failure (EF at cath 2016 was 40-50%), hypertension, hyperlipidemia, and type 2 diabetes.  Feels he is doing better on our updated medication regimen which includes ARNI, diuretic, and carvedilol.  He has had less angina.  He denies orthopnea.  No difficulty sleeping.  No medication side effects.  Past Medical History:  Diagnosis Date  . Chest pain    a. experience weekly, nitrate responsive c/p x several years.   b. ranexa added on 08/2014 admission   . Chronic combined systolic and diastolic CHF (congestive heart failure) ([place])   . Claudication ([place])    a. 08/2014 ABI: R 0.79, L 1.0-->Walking program instituted.  . Coronary artery disease    a. s/p MI in 1989;  b. 04/2008 CABG x 5 (LIMA->LAD, VG->D1, VG->RI, VG->PDA, L Radial->OM2);  c. 06/2010 PCI/DES to the Diag (2.25x28 Promus Element DES). VG->Diag occluded;  d. 08/2010 Attempted PTCA of the OM2 via the free radial graft. Unsuccessful.  Lesion felt to be 2/2 suture.  c. NSTEMI s/p [facility] with severe native and graph dz; D1 that was stented in 2012 now occulded: Rx therapy recommended  . Coronary artery disease involving native coronary artery of native heart with angina pectoris ([place])    a. s/p MI in 1989;  b. 04/2008 CABG x 5 (LIMA->LAD, VG->D1, VG->RI, VG->PDA, L Radial->OM2);  c. 06/2010 PCI/DES to the Diag (2.25x28 Promus Element DES). VG->Diag occluded;  d. 08/2010 Attempted PTCA of the OM2 via the free radial graft. Unsuccessful.  Lesion felt  to be 2/2 suture.  c. NSTEMI s/p [facility] with severe native and graph dz; Rx therapy recommended   . Diabetes mellitus ([place]) 06/02/2011  . Hyperlipidemia   . Hypertension   . NSTEMI (non-ST elevated myocardial infarction) ([place]) 08/29/2014  . Peripheral arterial disease ([place]) 08/16/2014  . Type II diabetes mellitus ([place])     Past Surgical History:  Procedure Laterality Date  . CARDIAC CATHETERIZATION N/A 08/28/2014   Procedure: Left Heart Cath and Cors/Grafts Angiography;  Surgeon: Belva Crome, MD;  Location: [place] [facility];  Service: Cardiovascular;  Laterality: N/A;  . COLONOSCOPY    . CORONARY ANGIOPLASTY WITH STENT PLACEMENT    . CORONARY ARTERY BYPASS GRAFT    . LEFT HEART CATH AND CORS/GRAFTS ANGIOGRAPHY N/A 03/20/2018   Procedure: LEFT HEART CATH AND CORS/GRAFTS ANGIOGRAPHY;  Surgeon: Martinique, Peter M, MD;  Location: [place] [facility];  Service: Cardiovascular;  Laterality: N/A;  . PERIPHERAL VASCULAR CATHETERIZATION N/A 07/15/2015   Procedure: Abdominal Aortogram w/Lower Extremity;  Surgeon: Wellington Hampshire, MD;  Location: [place] [facility];  Service: Cardiovascular;  Laterality: N/A;  . PERIPHERAL VASCULAR CATHETERIZATION Bilateral 07/15/2015   Procedure: Peripheral Vascular Intervention;  Surgeon: Wellington Hampshire, MD;  Location: [place] [facility];  Service: Cardiovascular;  Laterality: Bilateral;  Common Iliacs  . SHOULDER ARTHROSCOPY WITH ROTATOR CUFF REPAIR AND SUBACROMIAL DECOMPRESSION Left 05/23/2013   Procedure: LEFT SHOULDER ARTHROSCOPY SUBACROMIAL  DECOMPRESSION DISTAL CLAVICLE RESECTION AND ROTATOR CUFF REPAIR ;  Surgeon: Marin Shutter, MD;  Location: [place];  Service: Orthopedics;  Laterality: Left;  . SHOULDER SURGERY Right 7-8 years ago   Dr. Dorna Leitz    Current Medications: Current Meds  Medication Sig  . acetaminophen (TYLENOL) 500 MG tablet Take 500 mg by mouth daily as needed for mild pain.  Marland Kitchen ALPRAZolam (XANAX) 0.25 MG tablet Take 0.25 mg by mouth 3 (three)  times daily as needed for anxiety.   . Aromatic Inhalants (VICKS VAPOR INHALER IN) Inhale into the lungs daily as needed (for wheezing).   Marland Kitchen aspirin 81 MG tablet Take 81 mg by mouth daily.   Marland Kitchen atorvastatin (LIPITOR) 80 MG tablet Take 1 tablet (80 mg total) by mouth daily.  . Azelastine HCl 0.15 % SOLN Place 1 spray into both nostrils daily.  . carvedilol (COREG) 6.25 MG tablet Take 1 tablet (6.25 mg total) by mouth 2 (two) times daily.  . cholecalciferol (VITAMIN D) 1000 UNITS tablet Take 1,000 Units by mouth daily.   . clopidogrel (PLAVIX) 75 MG tablet Take 75 mg by mouth daily with breakfast.  . desoximetasone (TOPICORT) 0.25 % cream Apply 1 application topically once a week.   . ezetimibe (ZETIA) 10 MG tablet TAKE 1 TABLET BY MOUTH  DAILY  . fluticasone (FLONASE) 50 MCG/ACT nasal spray Place 2 sprays into the nose daily. For nasal congestion  . furosemide (LASIX) 40 MG tablet Take 1 tablet (40 mg total) by mouth daily.  . isosorbide mononitrate (IMDUR) 30 MG 24 hr tablet Take 1 tablet (30 mg total) by mouth daily.  . metFORMIN (GLUCOPHAGE) 1000 MG tablet Take 1,000 mg by mouth 2 (two) times daily with a meal.  . nitroGLYCERIN (NITROSTAT) 0.4 MG SL tablet Place 0.4 mg under the tongue every 5 (five) minutes x 3 doses as needed. For chest pain  . Polyvinyl Alcohol-Povidone (CLEAR EYES ALL SEASONS OP) Place 1 drop into both eyes daily as needed (for dry eyes).  . ranolazine (RANEXA) 500 MG 12 hr tablet TAKE 1 TABLET BY MOUTH TWO  TIMES DAILY  . sacubitril-valsartan (ENTRESTO) 97-103 MG Take 1 tablet by mouth 2 (two) times daily.  . sitaGLIPtin (JANUVIA) 100 MG tablet Take 100 mg by mouth daily.      Allergies:   Aldactone [spironolactone], Percocet [oxycodone-acetaminophen], Avandia [rosiglitazone], and Codeine   Social History   Socioeconomic History  . Marital status: Married    Spouse name: Not on file  . Number of children: Not on file  . Years of education: Not on file  . Highest  education level: Not on file  Occupational History  . Not on file  Social Needs  . Financial resource strain: Not on file  . Food insecurity    Worry: Not on file    Inability: Not on file  . Transportation needs    Medical: Not on file    Non-medical: Not on file  Tobacco Use  . Smoking status: Former Smoker    Quit date: 01/04/1987    Years since quitting: 31.5  . Smokeless tobacco: Former Network engineer and Sexual Activity  . Alcohol use: Yes    Alcohol/week: 2.0 standard drinks    Types: 2 Cans of beer per week    Comment: a week  . Drug use: No  . Sexual activity: Not on file  Lifestyle  . Physical activity    Days per week: Not on file  Minutes per session: Not on file  . Stress: Not on file  Relationships  . Social Herbalist on phone: Not on file    Gets together: Not on file    Attends religious service: Not on file    Active member of club or organization: Not on file    Attends meetings of clubs or organizations: Not on file    Relationship status: Not on file  Other Topics Concern  . Not on file  Social History Narrative   Lives in [place] with his wife.  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     Family History: The patient's family history includes Anesthesia problems in his brother; Diabetes in his brother; Heart Problems in his brother, brother, and sister; Heart attack in his father; Heart disease in his mother; Heart failure in his mother; Hypertension in his brother; Kidney failure in his brother; Lung cancer in his mother.  ROS:   Please see the history of present illness.    2 of his grandchildren are living with him now and that is causing stress.  All other systems reviewed and are negative.  EKGs/Labs/Other Studies Reviewed:    The following studies were reviewed today: No new imaging  EKG:  EKG no new EKG  Recent Labs: 03/20/2018: ALT 24; B Natriuretic Peptide 376.7; TSH 1.664 03/21/2018: Hemoglobin 13.2; Magnesium 1.4; Platelets  143 07/02/2018: NT-Pro BNP 854 07/11/2018: BUN 9; Creatinine, Ser 0.97; Potassium 5.0; Sodium 126  Recent Lipid Panel    Component Value Date/Time   CHOL 114 03/20/2018 1001   TRIG 58 03/20/2018 1001   HDL 47 03/20/2018 1001   CHOLHDL 2.4 03/20/2018 1001   VLDL 12 03/20/2018 1001   LDLCALC 55 03/20/2018 1001    Physical Exam:    VS:  BP 138/82   Pulse 62   Ht 5' 6.5" (1.689 m)   Wt 173 lb 6.4 oz (78.7 kg)   SpO2 100%   BMI 27.57 kg/m     Wt Readings from Last 3 Encounters:  07/24/18 173 lb 6.4 oz (78.7 kg)  05/31/18 169 lb (76.7 kg)  04/16/18 165 lb (74.8 kg)     GEN: Moderate obesity. No acute distress HEENT: Normal NECK: No JVD. LYMPHATICS: No lymphadenopathy CARDIAC:  RRR.  There is a left lower sternal border to apical systolic mitral regurgitation murmur.  No gallop, or edema. VASCULAR:  Normal Pulses. No bruits. RESPIRATORY:  Clear to auscultation without rales, wheezing or rhonchi  ABDOMEN: Soft, non-tender, non-distended, No pulsatile mass, MUSCULOSKELETAL: No deformity  SKIN: Warm and dry NEUROLOGIC:  Alert and oriented x 3 PSYCHIATRIC:  Normal affect   ASSESSMENT:    1. Coronary artery disease involving native coronary artery of native heart with angina pectoris ([place])   2. Chronic combined systolic and diastolic CHF (congestive heart failure) ([facility])   3. Other hyperlipidemia   4. Type 2 diabetes mellitus with other circulatory complication, without long-term current use of insulin ([place])   5. Essential hypertension    PLAN:    In order of problems listed above:  1. Secondary risk prevention has been tightened and he is doing well. 2. Guideline directed therapy for heart failure is in effect.  His hemoglobin A1c is mildly elevated.  He would be a good candidate for SGLT2 therapy.  We will run this by his primary physician.  Plan to repeat echo in 4 months. 3. Target LDL less than 70. 4. Consider adding an SGLT2 in this  patient with heart failure. 5.  Blood pressure is borderline.  Target 130/80 mmHg.  Overall education and awareness concerning primary/secondary risk prevention was discussed in detail: LDL less than 70, hemoglobin A1c less than 7, blood pressure target less than 130/80 mmHg, >150 minutes of moderate aerobic activity per week, avoidance of smoking, weight control (via diet and exercise), and continued surveillance/management of/for obstructive sleep apnea.    Medication Adjustments/Labs and Tests Ordered: Current medicines are reviewed at length with the patient today.  Concerns regarding medicines are outlined above.  No orders of the defined types were placed in this encounter.  No orders of the defined types were placed in this encounter.   There are no Patient Instructions on file for this visit.   Signed, Sinclair Grooms, MD  07/24/2018 10:39 AM    [place]

## 2018-07-24 ENCOUNTER — Ambulatory Visit (INDEPENDENT_AMBULATORY_CARE_PROVIDER_SITE_OTHER): Payer: Medicare Other | Admitting: Interventional Cardiology

## 2018-07-24 ENCOUNTER — Encounter: Payer: Self-pay | Admitting: *Deleted

## 2018-07-24 ENCOUNTER — Encounter: Payer: Self-pay | Admitting: Interventional Cardiology

## 2018-07-24 ENCOUNTER — Other Ambulatory Visit: Payer: Self-pay

## 2018-07-24 VITALS — BP 138/82 | HR 62 | Ht 66.5 in | Wt 173.4 lb

## 2018-07-24 DIAGNOSIS — I5042 Chronic combined systolic (congestive) and diastolic (congestive) heart failure: Secondary | ICD-10-CM

## 2018-07-24 DIAGNOSIS — I10 Essential (primary) hypertension: Secondary | ICD-10-CM

## 2018-07-24 DIAGNOSIS — E1159 Type 2 diabetes mellitus with other circulatory complications: Secondary | ICD-10-CM

## 2018-07-24 DIAGNOSIS — E7849 Other hyperlipidemia: Secondary | ICD-10-CM

## 2018-07-24 DIAGNOSIS — I25119 Atherosclerotic heart disease of native coronary artery with unspecified angina pectoris: Secondary | ICD-10-CM | POA: Diagnosis not present

## 2018-07-24 NOTE — Patient Instructions (Signed)
Medication Instructions:  Your physician recommends that you continue on your current medications as directed. Please refer to the Current Medication list given to you today.  If you need a refill on your cardiac medications before your next appointment, please call your pharmacy.   Lab work: None If you have labs (blood work) drawn today and your tests are completely normal, you will receive your results only by: . MyChart Message (if you have MyChart) OR . A paper copy in the mail If you have any lab test that is abnormal or we need to change your treatment, we will call you to review the results.  Testing/Procedures: None  Follow-Up: At [facility], you and your health needs are our priority.  As part of our continuing mission to provide you with exceptional heart care, we have created designated Provider Care Teams.  These Care Teams include your primary Cardiologist (physician) and Advanced Practice Providers (APPs -  Physician Assistants and Nurse Practitioners) who all work together to provide you with the care you need, when you need it. You will need a follow up appointment in 4 months.  Please call our office 2 months in advance to schedule this appointment.  You may see Henry W Smith III, MD or one of the following Advanced Practice Providers on your designated Care Team:   Lori Gerhardt, NP Laura Ingold, NP . Jill McDaniel, NP  Any Other Special Instructions Will Be Listed Below (If Applicable).    

## 2018-07-24 NOTE — Addendum Note (Signed)
Addended by: Loren Racer on: 07/24/2018 11:02 AM   Modules accepted: Orders

## 2018-08-31 ENCOUNTER — Ambulatory Visit: Payer: Medicare Other | Admitting: Interventional Cardiology

## 2018-09-12 ENCOUNTER — Other Ambulatory Visit (HOSPITAL_COMMUNITY): Payer: Self-pay | Admitting: Cardiovascular Disease

## 2018-09-12 DIAGNOSIS — Z95828 Presence of other vascular implants and grafts: Secondary | ICD-10-CM

## 2018-09-12 DIAGNOSIS — I739 Peripheral vascular disease, unspecified: Secondary | ICD-10-CM

## 2018-09-13 ENCOUNTER — Other Ambulatory Visit: Payer: Self-pay

## 2018-09-13 ENCOUNTER — Ambulatory Visit (HOSPITAL_COMMUNITY)
Admission: RE | Admit: 2018-09-13 | Discharge: 2018-09-13 | Disposition: A | Discharge status: Home / Self Care | Payer: Medicare Other | Source: Ambulatory Visit | Attending: Cardiology | Admitting: Cardiology

## 2018-09-13 DIAGNOSIS — Z95828 Presence of other vascular implants and grafts: Secondary | ICD-10-CM | POA: Insufficient documentation

## 2018-09-13 DIAGNOSIS — I739 Peripheral vascular disease, unspecified: Secondary | ICD-10-CM | POA: Insufficient documentation

## 2018-09-14 ENCOUNTER — Other Ambulatory Visit (HOSPITAL_COMMUNITY): Payer: Self-pay | Admitting: Cardiovascular Disease

## 2018-09-14 DIAGNOSIS — I739 Peripheral vascular disease, unspecified: Secondary | ICD-10-CM

## 2018-09-14 DIAGNOSIS — Z95828 Presence of other vascular implants and grafts: Secondary | ICD-10-CM

## 2018-09-18 ENCOUNTER — Encounter: Payer: Self-pay | Admitting: Cardiovascular Disease

## 2018-09-18 ENCOUNTER — Ambulatory Visit (INDEPENDENT_AMBULATORY_CARE_PROVIDER_SITE_OTHER): Payer: Medicare Other | Admitting: Cardiovascular Disease

## 2018-09-18 ENCOUNTER — Other Ambulatory Visit: Payer: Self-pay

## 2018-09-18 VITALS — BP 149/73 | HR 60 | Temp 97.4°F | Ht 66.0 in | Wt 169.3 lb

## 2018-09-18 DIAGNOSIS — I25118 Atherosclerotic heart disease of native coronary artery with other forms of angina pectoris: Secondary | ICD-10-CM

## 2018-09-18 DIAGNOSIS — R0989 Other specified symptoms and signs involving the circulatory and respiratory systems: Secondary | ICD-10-CM

## 2018-09-18 DIAGNOSIS — I10 Essential (primary) hypertension: Secondary | ICD-10-CM

## 2018-09-18 DIAGNOSIS — I739 Peripheral vascular disease, unspecified: Secondary | ICD-10-CM | POA: Diagnosis not present

## 2018-09-18 NOTE — Patient Instructions (Signed)
Medication Instructions:  Your physician recommends that you continue on your current medications as directed. Please refer to the Current Medication list given to you today.  If you need a refill on your cardiac medications before your next appointment, please call your pharmacy.   Lab work: None ordered If you have labs (blood work) drawn today and your tests are completely normal, you will receive your results only by: [place] (if you have MyChart) OR A paper copy in the mail If you have any lab test that is abnormal or we need to change your treatment, we will call you to review the results.  Testing/Procedures: Aorta/Iliac and ABI in one year. We will call you to make that appointment.   Follow-Up: At [facility], you and your health needs are our priority.  As part of our continuing mission to provide you with exceptional heart care, we have created designated Provider Care Teams.  These Care Teams include your primary Cardiologist (physician) and Advanced Practice Providers (APPs -  Physician Assistants and Nurse Practitioners) who all work together to provide you with the care you need, when you need it. You will need a follow up appointment in 12 months.  Please call our office 2 months in advance to schedule this appointment.  You may see Kathlyn Sacramento, MD or one of the following Advanced Practice Providers on your designated Care Team:   Kerin Ransom, PA-C [address] PA-C [place], [place]

## 2018-09-18 NOTE — Progress Notes (Signed)
Cardiology Office Note   Date:  09/18/2018   ID:  [name] [name], DOB 07/01/1950, MRN [id number]  PCP:  [name], MD  Cardiologist: Dr. Tamala Julian  No chief complaint on file.     History of Present Illness: [name] [name] is a 68 y.o. male who presents for a follow-up visit regarding peripheral arterial disease. He has known history of coronary artery disease status post CABG, chronic systolic heart failure, hypertension, type 2 diabetes and hyperlipidemia.   He is status post bilateral common iliac artery kissing stent placement in July 2017 for severe claudication.  He is known to have severe heavily calcified disease affecting the right popliteal artery with collaterals.  This was left to be treated medically. The patient had worsening LV systolic function this year.  Cardiac catheterization showed patent grafts.  He was switched to [facility] with subsequent improvement.  Repeat echocardiogram in June showed an EF of 35 to 40%.  Recent aortoiliac duplex showed patent iliac stents with stable velocities.  He has very minimal right calf claudication.  Past Medical History:  Diagnosis Date  . Chest pain    a. experience weekly, nitrate responsive c/p x several years.   b. ranexa added on 08/2014 admission   . Chronic combined systolic and diastolic CHF (congestive heart failure) ([place])   . Claudication ([place])    a. 08/2014 ABI: R 0.79, L 1.0-->Walking program instituted.  . Coronary artery disease    a. s/p MI in 1989;  b. 04/2008 CABG x 5 (LIMA->LAD, VG->D1, VG->RI, VG->PDA, L Radial->OM2);  c. 06/2010 PCI/DES to the Diag (2.25x28 Promus Element DES). VG->Diag occluded;  d. 08/2010 Attempted PTCA of the OM2 via the free radial graft. Unsuccessful.  Lesion felt to be 2/2 suture.  c. NSTEMI s/p [facility] with severe native and graph dz; D1 that was stented in 2012 now occulded: Rx therapy recommended  . Coronary artery disease involving native coronary artery of native heart with angina pectoris  ([place])    a. s/p MI in 1989;  b. 04/2008 CABG x 5 (LIMA->LAD, VG->D1, VG->RI, VG->PDA, L Radial->OM2);  c. 06/2010 PCI/DES to the Diag (2.25x28 Promus Element DES). VG->Diag occluded;  d. 08/2010 Attempted PTCA of the OM2 via the free radial graft. Unsuccessful.  Lesion felt to be 2/2 suture.  c. NSTEMI s/p [facility] with severe native and graph dz; Rx therapy recommended   . Diabetes mellitus ([place]) 06/02/2011  . Hyperlipidemia   . Hypertension   . NSTEMI (non-ST elevated myocardial infarction) ([place]) 08/29/2014  . Peripheral arterial disease ([place]) 08/16/2014  . Type II diabetes mellitus ([place])     Past Surgical History:  Procedure Laterality Date  . CARDIAC CATHETERIZATION N/A 08/28/2014   Procedure: Left Heart Cath and Cors/Grafts Angiography;  Surgeon: Belva Crome, MD;  Location: [place] [facility];  Service: Cardiovascular;  Laterality: N/A;  . COLONOSCOPY    . CORONARY ANGIOPLASTY WITH STENT PLACEMENT    . CORONARY ARTERY BYPASS GRAFT    . LEFT HEART CATH AND CORS/GRAFTS ANGIOGRAPHY N/A 03/20/2018   Procedure: LEFT HEART CATH AND CORS/GRAFTS ANGIOGRAPHY;  Surgeon: Martinique, Peter M, MD;  Location: [place] [facility];  Service: Cardiovascular;  Laterality: N/A;  . PERIPHERAL VASCULAR CATHETERIZATION N/A 07/15/2015   Procedure: Abdominal Aortogram w/Lower Extremity;  Surgeon: Wellington Hampshire, MD;  Location: [place] [facility];  Service: Cardiovascular;  Laterality: N/A;  . PERIPHERAL VASCULAR CATHETERIZATION Bilateral 07/15/2015   Procedure: Peripheral Vascular Intervention;  Surgeon: Wellington Hampshire, MD;  Location: [place] [facility];  Service: Cardiovascular;  Laterality: Bilateral;  Common Iliacs  . SHOULDER ARTHROSCOPY WITH ROTATOR CUFF REPAIR AND SUBACROMIAL DECOMPRESSION Left 05/23/2013   Procedure: LEFT SHOULDER ARTHROSCOPY SUBACROMIAL  DECOMPRESSION DISTAL CLAVICLE RESECTION AND ROTATOR CUFF REPAIR ;  Surgeon: Marin Shutter, MD;  Location: [place];  Service: Orthopedics;  Laterality: Left;  .  SHOULDER SURGERY Right 7-8 years ago   Dr. Dorna Leitz     Current Outpatient Medications  Medication Sig Dispense Refill  . acetaminophen (TYLENOL) 500 MG tablet Take 500 mg by mouth daily as needed for mild pain.    Marland Kitchen ALPRAZolam (XANAX) 0.25 MG tablet Take 0.25 mg by mouth 3 (three) times daily as needed for anxiety.     . Aromatic Inhalants (VICKS VAPOR INHALER IN) Inhale into the lungs daily as needed (for wheezing).     Marland Kitchen aspirin 81 MG tablet Take 81 mg by mouth daily.     Marland Kitchen atorvastatin (LIPITOR) 80 MG tablet Take 1 tablet (80 mg total) by mouth daily. 90 tablet 3  . Azelastine HCl 0.15 % SOLN Place 1 spray into both nostrils daily.    . carvedilol (COREG) 6.25 MG tablet Take 1 tablet (6.25 mg total) by mouth 2 (two) times daily. 180 tablet 3  . cholecalciferol (VITAMIN D) 1000 UNITS tablet Take 1,000 Units by mouth daily.     . clopidogrel (PLAVIX) 75 MG tablet Take 75 mg by mouth daily with breakfast.    . desoximetasone (TOPICORT) 0.25 % cream Apply 1 application topically once a week.     . ezetimibe (ZETIA) 10 MG tablet TAKE 1 TABLET BY MOUTH  DAILY 90 tablet 3  . fluticasone (FLONASE) 50 MCG/ACT nasal spray Place 2 sprays into the nose daily. For nasal congestion    . furosemide (LASIX) 40 MG tablet Take 1 tablet (40 mg total) by mouth daily. 30 tablet 2  . isosorbide mononitrate (IMDUR) 30 MG 24 hr tablet Take 1 tablet (30 mg total) by mouth daily. 90 tablet 2  . metFORMIN (GLUCOPHAGE) 1000 MG tablet Take 1,000 mg by mouth 2 (two) times daily with a meal.    . nitroGLYCERIN (NITROSTAT) 0.4 MG SL tablet Place 0.4 mg under the tongue every 5 (five) minutes x 3 doses as needed. For chest pain    . Polyvinyl Alcohol-Povidone (CLEAR EYES ALL SEASONS OP) Place 1 drop into both eyes daily as needed (for dry eyes).    . ranolazine (RANEXA) 500 MG 12 hr tablet TAKE 1 TABLET BY MOUTH TWO  TIMES DAILY 180 tablet 3  . sacubitril-valsartan (ENTRESTO) 97-103 MG Take 1 tablet by mouth 2 (two)  times daily. 180 tablet 3  . sitaGLIPtin (JANUVIA) 100 MG tablet Take 100 mg by mouth daily.      No current facility-administered medications for this visit.     Allergies:   Aldactone [spironolactone], Percocet [oxycodone-acetaminophen], Avandia [rosiglitazone], and Codeine    Social History:  The patient  reports that he quit smoking about 31 years ago. He has quit using smokeless tobacco. He reports current alcohol use of about 2.0 standard drinks of alcohol per week. He reports that he does not use drugs.   Family History:  The patient's family history includes Anesthesia problems in his brother; Diabetes in his brother; Heart Problems in his brother, brother, and sister; Heart attack in his father; Heart disease in his mother; Heart failure in his mother; Hypertension in his brother; Kidney failure in his brother; Lung  cancer in his mother.    ROS:  Please see the history of present illness.   Otherwise, review of systems are positive for none.   All other systems are reviewed and negative.    PHYSICAL EXAM: VS:  BP (!) 149/73   Pulse 60   Temp (!) 97.4 F (36.3 C)   Ht 5\' 6"  (1.676 m)   Wt 169 lb 4.8 oz (76.8 kg)   SpO2 100%   BMI 27.33 kg/m  , BMI Body mass index is 27.33 kg/m. GEN: Well nourished, well developed, in no acute distress  HEENT: normal  Neck: no JVD,  or masses. Faint right carotid bruit Cardiac: RRR; no murmurs, rubs, or gallops,no edema  Respiratory:  clear to auscultation bilaterally, normal work of breathing GI: soft, nontender, nondistended, + BS MS: no deformity or atrophy  Skin: warm and dry, no rash Neuro:  Strength and sensation are intact Psych: euthymic mood, full affect Vascular: Femoral pulses are normal bilaterally.   EKG:  EKG is not  ordered today.   Recent Labs: 03/20/2018: ALT 24; B Natriuretic Peptide 376.7; TSH 1.664 03/21/2018: Hemoglobin 13.2; Magnesium 1.4; Platelets 143 07/02/2018: NT-Pro BNP 854 07/11/2018: BUN 9; Creatinine,  Ser 0.97; Potassium 5.0; Sodium 126    Lipid Panel    Component Value Date/Time   CHOL 114 03/20/2018 1001   TRIG 58 03/20/2018 1001   HDL 47 03/20/2018 1001   CHOLHDL 2.4 03/20/2018 1001   VLDL 12 03/20/2018 1001   LDLCALC 55 03/20/2018 1001      Wt Readings from Last 3 Encounters:  09/18/18 169 lb 4.8 oz (76.8 kg)  07/24/18 173 lb 6.4 oz (78.7 kg)  05/31/18 169 lb (76.7 kg)        ASSESSMENT AND PLAN:  1.  Peripheral arterial disease : Status post bilateral common iliac artery stent placement .  He is doing well overall with minimal right calf claudication due to known popliteal artery stenosis.  Recent  Duplex showed patent iliac stents.  Repeat studies in 1 year and continue medical therapy.    2. Coronary artery disease with other forms of angina: Symptoms are controlled with current antianginal therapy.  Most recent cardiac catheterization showed stable graft anatomy.  3. Essential hypertension: Blood pressure is reasonably controlled now on current medications.  4. Right carotid bruit: Most recent carotid Doppler in 2019 showed mild less than 40% disease bilaterally.   Disposition:   FU with me in 1 year.   Signed,  Kathlyn Sacramento, MD  09/18/2018 10:00 AM    [place]

## 2018-10-21 ENCOUNTER — Other Ambulatory Visit: Payer: Self-pay | Admitting: Physician Assistant

## 2018-10-28 ENCOUNTER — Other Ambulatory Visit: Payer: Self-pay | Admitting: Cardiovascular Disease

## 2018-10-28 DIAGNOSIS — E785 Hyperlipidemia, unspecified: Secondary | ICD-10-CM

## 2018-10-29 NOTE — Telephone Encounter (Signed)
Refill Request.  

## 2018-11-12 ENCOUNTER — Ambulatory Visit (HOSPITAL_COMMUNITY): Payer: Medicare Other | Attending: Internal Medicine

## 2018-11-12 ENCOUNTER — Other Ambulatory Visit: Payer: Self-pay

## 2018-11-12 DIAGNOSIS — I5042 Chronic combined systolic (congestive) and diastolic (congestive) heart failure: Secondary | ICD-10-CM

## 2018-11-21 NOTE — Progress Notes (Signed)
Cardiology Office Note:    Date:  11/22/2018   ID:  [name] [name], DOB 1950/10/21, MRN [id number]  PCP:  [name], MD  Cardiologist:  Sinclair Grooms, MD   Referring MD: [name], MD   Chief Complaint  Patient presents with  . Coronary Artery Disease  . Congestive Heart Failure    History of Present Illness:    [name] [name] is a 68 y.o. male with a hx of CAD, prior history of CABG and percutaneous coronary intervention (most recently 2012), chronic combined systolic and diastolic heart failure (EF at cath 2016 was 40-50%), hypertension, hyperlipidemia, and type 2 diabetes.  [name] [name] is doing better.  Medication adjustment has led to improvement in LV function.  Since discharge from the hospital in March with unstable angina, exertional tolerance is better and episodes of angina are much less.  Past Medical History:  Diagnosis Date  . Chest pain    a. experience weekly, nitrate responsive c/p x several years.   b. ranexa added on 08/2014 admission   . Chronic combined systolic and diastolic CHF (congestive heart failure) ([place])   . Claudication ([place])    a. 08/2014 ABI: R 0.79, L 1.0-->Walking program instituted.  . Coronary artery disease    a. s/p MI in 1989;  b. 04/2008 CABG x 5 (LIMA->LAD, VG->D1, VG->RI, VG->PDA, L Radial->OM2);  c. 06/2010 PCI/DES to the Diag (2.25x28 Promus Element DES). VG->Diag occluded;  d. 08/2010 Attempted PTCA of the OM2 via the free radial graft. Unsuccessful.  Lesion felt to be 2/2 suture.  c. NSTEMI s/p [facility] with severe native and graph dz; D1 that was stented in 2012 now occulded: Rx therapy recommended  . Coronary artery disease involving native coronary artery of native heart with angina pectoris ([place])    a. s/p MI in 1989;  b. 04/2008 CABG x 5 (LIMA->LAD, VG->D1, VG->RI, VG->PDA, L Radial->OM2);  c. 06/2010 PCI/DES to the Diag (2.25x28 Promus Element DES). VG->Diag occluded;  d. 08/2010 Attempted PTCA of the OM2 via the free radial graft.  Unsuccessful.  Lesion felt to be 2/2 suture.  c. NSTEMI s/p [facility] with severe native and graph dz; Rx therapy recommended   . Diabetes mellitus ([place]) 06/02/2011  . Hyperlipidemia   . Hypertension   . NSTEMI (non-ST elevated myocardial infarction) ([place]) 08/29/2014  . Peripheral arterial disease ([place]) 08/16/2014  . Type II diabetes mellitus ([place])     Past Surgical History:  Procedure Laterality Date  . CARDIAC CATHETERIZATION N/A 08/28/2014   Procedure: Left Heart Cath and Cors/Grafts Angiography;  Surgeon: Belva Crome, MD;  Location: [facility];  Service: Cardiovascular;  Laterality: N/A;  . COLONOSCOPY    . CORONARY ANGIOPLASTY WITH STENT PLACEMENT    . CORONARY ARTERY BYPASS GRAFT    . LEFT HEART CATH AND CORS/GRAFTS ANGIOGRAPHY N/A 03/20/2018   Procedure: LEFT HEART CATH AND CORS/GRAFTS ANGIOGRAPHY;  Surgeon: Martinique, Peter M, MD;  Location: [facility];  Service: Cardiovascular;  Laterality: N/A;  . PERIPHERAL VASCULAR CATHETERIZATION N/A 07/15/2015   Procedure: Abdominal Aortogram w/Lower Extremity;  Surgeon: Wellington Hampshire, MD;  Location: [facility];  Service: Cardiovascular;  Laterality: N/A;  . PERIPHERAL VASCULAR CATHETERIZATION Bilateral 07/15/2015   Procedure: Peripheral Vascular Intervention;  Surgeon: Wellington Hampshire, MD;  Location: [facility];  Service: Cardiovascular;  Laterality: Bilateral;  Common Iliacs  . SHOULDER ARTHROSCOPY WITH ROTATOR CUFF REPAIR AND SUBACROMIAL DECOMPRESSION Left 05/23/2013   Procedure: LEFT SHOULDER  ARTHROSCOPY SUBACROMIAL  DECOMPRESSION DISTAL CLAVICLE RESECTION AND ROTATOR CUFF REPAIR ;  Surgeon: Marin Shutter, MD;  Location: [facility];  Service: Orthopedics;  Laterality: Left;  . SHOULDER SURGERY Right 7-8 years ago   Dr. Dorna Leitz    Current Medications: Current Meds  Medication Sig  . acetaminophen (TYLENOL) 500 MG tablet Take 500 mg by mouth daily as needed for mild pain.  Marland Kitchen ALPRAZolam (XANAX) 0.25 MG tablet Take  0.25 mg by mouth 3 (three) times daily as needed for anxiety.   Marland Kitchen amLODipine (NORVASC) 10 MG tablet Take 10 mg by mouth daily.  Marland Kitchen aspirin 81 MG tablet Take 81 mg by mouth daily.   Marland Kitchen atorvastatin (LIPITOR) 80 MG tablet TAKE 1 TABLET BY MOUTH  DAILY AT 6 PM.  . Azelastine HCl 0.15 % SOLN Place 1 spray into both nostrils daily.  . carvedilol (COREG) 6.25 MG tablet Take 1 tablet (6.25 mg total) by mouth 2 (two) times daily.  . cholecalciferol (VITAMIN D) 1000 UNITS tablet Take 1,000 Units by mouth daily.   . clopidogrel (PLAVIX) 75 MG tablet Take 75 mg by mouth daily with breakfast.  . desoximetasone (TOPICORT) 0.25 % cream Apply 1 application topically once a week.   . ezetimibe (ZETIA) 10 MG tablet TAKE 1 TABLET BY MOUTH  DAILY  . fluticasone (FLONASE) 50 MCG/ACT nasal spray Place 2 sprays into the nose daily. For nasal congestion  . furosemide (LASIX) 40 MG tablet Take 1 tablet by mouth once daily  . isosorbide mononitrate (IMDUR) 30 MG 24 hr tablet Take 1 tablet (30 mg total) by mouth daily.  . metFORMIN (GLUCOPHAGE) 1000 MG tablet Take 1,000 mg by mouth 2 (two) times daily with a meal.  . nitroGLYCERIN (NITROSTAT) 0.4 MG SL tablet Place 0.4 mg under the tongue every 5 (five) minutes x 3 doses as needed. For chest pain  . Polyvinyl Alcohol-Povidone (CLEAR EYES ALL SEASONS OP) Place 1 drop into both eyes daily as needed (for dry eyes).  . ranolazine (RANEXA) 500 MG 12 hr tablet TAKE 1 TABLET BY MOUTH TWO  TIMES DAILY  . sacubitril-valsartan (ENTRESTO) 97-103 MG Take 1 tablet by mouth 2 (two) times daily.  . sitaGLIPtin (JANUVIA) 100 MG tablet Take 100 mg by mouth daily.      Allergies:   Aldactone [spironolactone], Percocet [oxycodone-acetaminophen], Avandia [rosiglitazone], and Codeine   Social History   Socioeconomic History  . Marital status: Married    Spouse name: Not on file  . Number of children: Not on file  . Years of education: Not on file  . Highest education level: Not on  file  Occupational History  . Not on file  Social Needs  . Financial resource strain: Not on file  . Food insecurity    Worry: Not on file    Inability: Not on file  . Transportation needs    Medical: Not on file    Non-medical: Not on file  Tobacco Use  . Smoking status: Former Smoker    Quit date: 01/04/1987    Years since quitting: 31.9  . Smokeless tobacco: Former Network engineer and Sexual Activity  . Alcohol use: Yes    Alcohol/week: 2.0 standard drinks    Types: 2 Cans of beer per week    Comment: a week  . Drug use: No  . Sexual activity: Not on file  Lifestyle  . Physical activity    Days per week: Not on file    Minutes per session:  Not on file  . Stress: Not on file  Relationships  . Social Herbalist on phone: Not on file    Gets together: Not on file    Attends religious service: Not on file    Active member of club or organization: Not on file    Attends meetings of clubs or organizations: Not on file    Relationship status: Not on file  Other Topics Concern  . Not on file  Social History Narrative   Lives in [place] with his wife.  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     Family History: The patient's family history includes Anesthesia problems in his brother; Diabetes in his brother; Heart Problems in his brother, brother, and sister; Heart attack in his father; Heart disease in his mother; Heart failure in his mother; Hypertension in his brother; Kidney failure in his brother; Lung cancer in his mother.  ROS:   Please see the history of present illness.    He is unsure if he is taking amlodipine.  He measured his blood pressure at home and states it runs in the 500 systolic range.  He has a rash that has been present on his torso and arms and legs for weeks.  He is using hydrocortisone cream.  All other systems reviewed and are negative.  EKGs/Labs/Other Studies Reviewed:    The following studies were reviewed today: 2 D Doppler  Echocardiography 2020:  IMPRESSIONS    1. Left ventricular ejection fraction, by visual estimation, is 60 to 65%. The left ventricle has normal function. There is no left ventricular hypertrophy.  2. Elevated left atrial pressure.  3. Global right ventricle has normal systolic function.The right ventricular size is normal. No increase in right ventricular wall thickness.  4. Left atrial size was normal.  5. Right atrial size was normal.  6. The mitral valve is normal in structure. Mild to moderate mitral valve regurgitation.  7. The tricuspid valve is normal in structure. Tricuspid valve regurgitation is trivial.  8. The aortic valve is tricuspid. Aortic valve regurgitation is not visualized.  9. The pulmonic valve was grossly normal. Pulmonic valve regurgitation is not visualized. 10. Normal pulmonary artery systolic pressure. 11. The left ventricular function has improved.  EKG:  EKG not repeated.  Recent Labs: 03/20/2018: ALT 24; B Natriuretic Peptide 376.7; TSH 1.664 03/21/2018: Hemoglobin 13.2; Magnesium 1.4; Platelets 143 07/02/2018: NT-Pro BNP 854 07/11/2018: BUN 9; Creatinine, Ser 0.97; Potassium 5.0; Sodium 126  Recent Lipid Panel    Component Value Date/Time   CHOL 114 03/20/2018 1001   TRIG 58 03/20/2018 1001   HDL 47 03/20/2018 1001   CHOLHDL 2.4 03/20/2018 1001   VLDL 12 03/20/2018 1001   LDLCALC 55 03/20/2018 1001    Physical Exam:    VS:  BP (!) 162/84   Pulse (!) 59   Ht 5\' 6"  (1.676 m)   Wt 171 lb 12.8 oz (77.9 kg)   SpO2 98%   BMI 27.73 kg/m     Wt Readings from Last 3 Encounters:  11/22/18 171 lb 12.8 oz (77.9 kg)  09/18/18 169 lb 4.8 oz (76.8 kg)  07/24/18 173 lb 6.4 oz (78.7 kg)     GEN: Compatible with age.  Not overweight. No acute distress HEENT: Normal NECK: No JVD. LYMPHATICS: No lymphadenopathy CARDIAC: 2/6 systolic left mid sternal border murmur without radiation to the axilla.  RRR without diastolic murmur, gallop, or edema. VASCULAR:   Normal Pulses. No bruits.  RESPIRATORY:  Clear to auscultation without rales, wheezing or rhonchi  ABDOMEN: Soft, non-tender, non-distended, No pulsatile mass, MUSCULOSKELETAL: No deformity  SKIN: Warm and dry NEUROLOGIC:  Alert and oriented x 3 PSYCHIATRIC:  Normal affect   ASSESSMENT:    1. Chronic combined systolic and diastolic CHF (congestive heart failure) ([place])   2. Essential hypertension   3. PAD (peripheral artery disease) ([place])   4. S/P insertion of iliac artery stent   5. Other hyperlipidemia   6. Type 2 diabetes mellitus with other circulatory complication, without long-term current use of insulin ([place])   7. Coronary artery disease involving coronary bypass graft of native heart without angina pectoris   8. Educated about COVID-19 virus infection   9. Rash    PLAN:    In order of problems listed above:  1. Volume status is improved.  LV function is improved on carvedilol and Entresto.  He is on moderate intensity loop diuretic therapy to control volume.  Continue current therapy.  EF now greater than 50%. 2. Still not completely happy with systolic blood pressures.  Patient has diffuse atherosclerosis and currently seems to be tolerating the medication regimen without side effects.  He has recorded blood pressures at home are around 140 mmHg early in the morning and in the 130s in the mid afternoon with medications and therapeutic effect.  No change in therapy. 3. Not discussed 4. Not discussed 5. LDL was 55 when checked in March.  This is quite good. 6. Hemoglobin A1c 7.1.  I have encouraged activity and decrease carbohydrates in diet. 7. Secondary prevention discussed.  Nitro use has been minimal.  Cautioned to be careful in cold weather. 8. 3W's being applied to lifestyle to prevent Covid infection. 9. Likely related to dyshidrotic eczema.  Rule out drug reaction.  Overall education and awareness concerning primary/secondary risk prevention was discussed in detail:  LDL less than 70, hemoglobin A1c less than 7, blood pressure target less than 130/80 mmHg, >150 minutes of moderate aerobic activity per week, avoidance of smoking, weight control (via diet and exercise), and continued surveillance/management of/for obstructive sleep apnea.    Medication Adjustments/Labs and Tests Ordered: Current medicines are reviewed at length with the patient today.  Concerns regarding medicines are outlined above.  Orders Placed This Encounter  Procedures  . Basic metabolic panel  . CBC   No orders of the defined types were placed in this encounter.   Patient Instructions  Medication Instructions:  Your physician recommends that you continue on your current medications as directed. Please refer to the Current Medication list given to you today.  *If you need a refill on your cardiac medications before your next appointment, please call your pharmacy*  Lab Work: BMET and CBC today  If you have labs (blood work) drawn today and your tests are completely normal, you will receive your results only by: Marland Kitchen MyChart Message (if you have MyChart) OR . A paper copy in the mail If you have any lab test that is abnormal or we need to change your treatment, we will call you to review the results.  Testing/Procedures: None  Follow-Up: At [facility], you and your health needs are our priority.  As part of our continuing mission to provide you with exceptional heart care, we have created designated Provider Care Teams.  These Care Teams include your primary Cardiologist (physician) and Advanced Practice Providers (APPs -  Physician Assistants and Nurse Practitioners) who all work together to provide you with the  care you need, when you need it.  Your next appointment:   6 month(s)  The format for your next appointment:   In Person  Provider:   You may see Sinclair Grooms, MD or one of the following Advanced Practice Providers on your designated Care Team:    Truitt Merle, NP  Cecilie Kicks, NP  Kathyrn Drown, NP   Other Instructions      Signed, Sinclair Grooms, MD  11/22/2018 1:05 PM    [place]

## 2018-11-22 ENCOUNTER — Encounter: Payer: Self-pay | Admitting: Interventional Cardiology

## 2018-11-22 ENCOUNTER — Other Ambulatory Visit: Payer: Self-pay

## 2018-11-22 ENCOUNTER — Ambulatory Visit: Payer: Medicare Other | Admitting: Interventional Cardiology

## 2018-11-22 VITALS — BP 162/84 | HR 59 | Ht 66.0 in | Wt 171.8 lb

## 2018-11-22 DIAGNOSIS — E1159 Type 2 diabetes mellitus with other circulatory complications: Secondary | ICD-10-CM

## 2018-11-22 DIAGNOSIS — I110 Hypertensive heart disease with heart failure: Secondary | ICD-10-CM | POA: Diagnosis not present

## 2018-11-22 DIAGNOSIS — I739 Peripheral vascular disease, unspecified: Secondary | ICD-10-CM | POA: Diagnosis not present

## 2018-11-22 DIAGNOSIS — R21 Rash and other nonspecific skin eruption: Secondary | ICD-10-CM

## 2018-11-22 DIAGNOSIS — I10 Essential (primary) hypertension: Secondary | ICD-10-CM

## 2018-11-22 DIAGNOSIS — I5042 Chronic combined systolic (congestive) and diastolic (congestive) heart failure: Secondary | ICD-10-CM

## 2018-11-22 DIAGNOSIS — Z95828 Presence of other vascular implants and grafts: Secondary | ICD-10-CM

## 2018-11-22 DIAGNOSIS — Z7189 Other specified counseling: Secondary | ICD-10-CM

## 2018-11-22 DIAGNOSIS — I25810 Atherosclerosis of coronary artery bypass graft(s) without angina pectoris: Secondary | ICD-10-CM

## 2018-11-22 DIAGNOSIS — E7849 Other hyperlipidemia: Secondary | ICD-10-CM

## 2018-11-22 LAB — CBC
Hematocrit: 40.9 % (ref 37.5–51.0)
Hemoglobin: 13.9 g/dL (ref 13.0–17.7)
MCH: 31.4 pg (ref 26.6–33.0)
MCHC: 34 g/dL (ref 31.5–35.7)
MCV: 93 fL (ref 79–97)
Platelets: 169 10*3/uL (ref 150–450)
RBC: 4.42 x10E6/uL (ref 4.14–5.80)
RDW: 12.2 % (ref 11.6–15.4)
WBC: 8.7 10*3/uL (ref 3.4–10.8)

## 2018-11-22 LAB — BASIC METABOLIC PANEL
BUN/Creatinine Ratio: 18 (ref 10–24)
BUN: 21 mg/dL (ref 8–27)
CO2: 22 mmol/L (ref 20–29)
Calcium: 9.7 mg/dL (ref 8.6–10.2)
Chloride: 94 mmol/L — ABNORMAL LOW (ref 96–106)
Creatinine, Ser: 1.15 mg/dL (ref 0.76–1.27)
GFR calc Af Amer: 74 mL/min/{1.73_m2} (ref >59)
GFR calc non Af Amer: 64 mL/min/{1.73_m2} (ref >59)
Glucose: 114 mg/dL — ABNORMAL HIGH (ref 65–99)
Potassium: 5.2 mmol/L (ref 3.5–5.2)
Sodium: 128 mmol/L — ABNORMAL LOW (ref 134–144)

## 2018-11-22 NOTE — Patient Instructions (Signed)
Medication Instructions:  Your physician recommends that you continue on your current medications as directed. Please refer to the Current Medication list given to you today.  *If you need a refill on your cardiac medications before your next appointment, please call your pharmacy*  Lab Work: BMET and CBC today  If you have labs (blood work) drawn today and your tests are completely normal, you will receive your results only by: Marland Kitchen MyChart Message (if you have MyChart) OR . A paper copy in the mail If you have any lab test that is abnormal or we need to change your treatment, we will call you to review the results.  Testing/Procedures: None  Follow-Up: At [facility], you and your health needs are our priority.  As part of our continuing mission to provide you with exceptional heart care, we have created designated Provider Care Teams.  These Care Teams include your primary Cardiologist (physician) and Advanced Practice Providers (APPs -  Physician Assistants and Nurse Practitioners) who all work together to provide you with the care you need, when you need it.  Your next appointment:   6 month(s)  The format for your next appointment:   In Person  Provider:   You may see Sinclair Grooms, MD or one of the following Advanced Practice Providers on your designated Care Team:    Truitt Merle, NP  Cecilie Kicks, NP  Kathyrn Drown, NP   Other Instructions

## 2019-01-02 ENCOUNTER — Other Ambulatory Visit: Payer: Self-pay | Admitting: Physician Assistant

## 2019-01-04 DIAGNOSIS — U071 COVID-19: Secondary | ICD-10-CM

## 2019-01-04 HISTORY — DX: COVID-19: U07.1

## 2019-05-13 ENCOUNTER — Other Ambulatory Visit: Payer: Self-pay | Admitting: Interventional Cardiology

## 2019-05-13 ENCOUNTER — Telehealth: Payer: Self-pay | Admitting: Interventional Cardiology

## 2019-05-13 ENCOUNTER — Other Ambulatory Visit: Payer: Self-pay

## 2019-05-13 MED ORDER — SACUBITRIL-VALSARTAN 97-103 MG PO TABS: 1.0000 | ORAL_TABLET | Freq: Two times a day (BID) | ORAL | 3 refills | Status: DC

## 2019-05-13 NOTE — Telephone Encounter (Signed)
*  STAT* If patient is at the pharmacy, call can be transferred to refill team.   1. Which medications need to be refilled? (please list name of each medication and dose if known)  sacubitril-valsartan (ENTRESTO) 97-103 MG  2. Which pharmacy/location (including street and city if local pharmacy) is medication to be sent to? [place], [address]  3. Do they need a 30 day or 90 day supply? 90 day supply    Patient is wanting prescription through [facility] instead of [facility]. [facility] states they were unable to get the prescription transferred over to them. Please advise.

## 2019-05-24 ENCOUNTER — Other Ambulatory Visit: Payer: Self-pay | Admitting: Physician Assistant

## 2019-05-24 DIAGNOSIS — I10 Essential (primary) hypertension: Secondary | ICD-10-CM

## 2019-07-20 ENCOUNTER — Other Ambulatory Visit: Payer: Self-pay | Admitting: Interventional Cardiology

## 2019-08-19 DIAGNOSIS — I739 Peripheral vascular disease, unspecified: Secondary | ICD-10-CM | POA: Diagnosis not present

## 2019-08-19 DIAGNOSIS — E1151 Type 2 diabetes mellitus with diabetic peripheral angiopathy without gangrene: Secondary | ICD-10-CM | POA: Diagnosis not present

## 2019-08-19 DIAGNOSIS — I10 Essential (primary) hypertension: Secondary | ICD-10-CM | POA: Diagnosis not present

## 2019-08-30 NOTE — Progress Notes (Signed)
Cardiology Office Note:    Date:  09/02/2019   ID:  [name] [name], DOB 1950-07-19, MRN [id number]  PCP:  [name], MD  Cardiologist:  Sinclair Grooms, MD   Referring MD: [name], MD   No chief complaint on file.   History of Present Illness:    [name] [name] is a 69 y.o. male with a hx of a hx of CAD, prior history of CABG and percutaneous coronary intervention (most recently 2012), chronic combined systolic and diastolic heart failure (EF at cath 2016 was 40-50%), hypertension, hyperlipidemia, and type 2 diabetes.  [name] is having some shortness of breath in the evening after he is finished working.  He has angina but understands how to carry out his activities without excessive discomfort or problems.  He is not needing to use nitroglycerin.  He denies orthopnea, PND, and lower extremity edema has resolved off amlodipine.  Past Medical History:  Diagnosis Date   Chest pain    a. experience weekly, nitrate responsive c/p x several years.   b. ranexa added on 08/2014 admission    Chronic combined systolic and diastolic CHF (congestive heart failure) ([place])    Claudication ([place])    a. 08/2014 ABI: R 0.79, L 1.0-->Walking program instituted.   Coronary artery disease    a. s/p MI in 1989;  b. 04/2008 CABG x 5 (LIMA->LAD, VG->D1, VG->RI, VG->PDA, L Radial->OM2);  c. 06/2010 PCI/DES to the Diag (2.25x28 Promus Element DES). VG->Diag occluded;  d. 08/2010 Attempted PTCA of the OM2 via the free radial graft. Unsuccessful.  Lesion felt to be 2/2 suture.  c. NSTEMI s/p [facility] with severe native and graph dz; D1 that was stented in 2012 now occulded: Rx therapy recommended   Coronary artery disease involving native coronary artery of native heart with angina pectoris ([place])    a. s/p MI in 1989;  b. 04/2008 CABG x 5 (LIMA->LAD, VG->D1, VG->RI, VG->PDA, L Radial->OM2);  c. 06/2010 PCI/DES to the Diag (2.25x28 Promus Element DES). VG->Diag occluded;  d. 08/2010 Attempted PTCA of the OM2 via  the free radial graft. Unsuccessful.  Lesion felt to be 2/2 suture.  c. NSTEMI s/p [facility] with severe native and graph dz; Rx therapy recommended    Diabetes mellitus ([place]) 06/02/2011   Hyperlipidemia    Hypertension    NSTEMI (non-ST elevated myocardial infarction) ([place]) 08/29/2014   Peripheral arterial disease ([place]) 08/16/2014   Type II diabetes mellitus ([place])     Past Surgical History:  Procedure Laterality Date   CARDIAC CATHETERIZATION N/A 08/28/2014   Procedure: Left Heart Cath and Cors/Grafts Angiography;  Surgeon: Belva Crome, MD;  Location: [place] [facility];  Service: Cardiovascular;  Laterality: N/A;   COLONOSCOPY     CORONARY ANGIOPLASTY WITH STENT PLACEMENT     CORONARY ARTERY BYPASS GRAFT     LEFT HEART CATH AND CORS/GRAFTS ANGIOGRAPHY N/A 03/20/2018   Procedure: LEFT HEART CATH AND CORS/GRAFTS ANGIOGRAPHY;  Surgeon: Martinique, Peter M, MD;  Location: [place] [facility];  Service: Cardiovascular;  Laterality: N/A;   PERIPHERAL VASCULAR CATHETERIZATION N/A 07/15/2015   Procedure: Abdominal Aortogram w/Lower Extremity;  Surgeon: Wellington Hampshire, MD;  Location: [place] [facility];  Service: Cardiovascular;  Laterality: N/A;   PERIPHERAL VASCULAR CATHETERIZATION Bilateral 07/15/2015   Procedure: Peripheral Vascular Intervention;  Surgeon: Wellington Hampshire, MD;  Location: [place] [facility];  Service: Cardiovascular;  Laterality: Bilateral;  Common Iliacs   SHOULDER ARTHROSCOPY WITH ROTATOR CUFF REPAIR AND SUBACROMIAL  DECOMPRESSION Left 05/23/2013   Procedure: LEFT SHOULDER ARTHROSCOPY SUBACROMIAL  DECOMPRESSION DISTAL CLAVICLE RESECTION AND ROTATOR CUFF REPAIR ;  Surgeon: Marin Shutter, MD;  Location: [place];  Service: Orthopedics;  Laterality: Left;   SHOULDER SURGERY Right 7-8 years ago   Dr. Dorna Leitz    Current Medications: Current Meds  Medication Sig   acetaminophen (TYLENOL) 500 MG tablet Take 500 mg by mouth daily as needed for mild pain.   ALPRAZolam  (XANAX) 0.25 MG tablet Take 0.25 mg by mouth 3 (three) times daily as needed for anxiety.    aspirin 81 MG tablet Take 81 mg by mouth daily.    atorvastatin (LIPITOR) 80 MG tablet TAKE 1 TABLET BY MOUTH  DAILY AT 6 PM.   Azelastine HCl 0.15 % SOLN Place 1 spray into both nostrils daily.   carvedilol (COREG) 6.25 MG tablet Take 1 tablet (6.25 mg total) by mouth 2 (two) times daily with a meal.   cholecalciferol (VITAMIN D) 1000 UNITS tablet Take 1,000 Units by mouth daily.    clopidogrel (PLAVIX) 75 MG tablet Take 75 mg by mouth daily with breakfast.   desoximetasone (TOPICORT) 0.25 % cream Apply 1 application topically once a week.    ezetimibe (ZETIA) 10 MG tablet TAKE 1 TABLET BY MOUTH  DAILY   fluticasone (FLONASE) 50 MCG/ACT nasal spray Place 2 sprays into the nose daily. For nasal congestion   furosemide (LASIX) 40 MG tablet Take 1 tablet by mouth once daily   isosorbide mononitrate (IMDUR) 30 MG 24 hr tablet Take 1 tablet by mouth once daily   metFORMIN (GLUCOPHAGE) 1000 MG tablet Take 1,000 mg by mouth 2 (two) times daily with a meal.   nitroGLYCERIN (NITROSTAT) 0.4 MG SL tablet Place 0.4 mg under the tongue every 5 (five) minutes x 3 doses as needed. For chest pain   Polyvinyl Alcohol-Povidone (CLEAR EYES ALL SEASONS OP) Place 1 drop into both eyes daily as needed (for dry eyes).   ranolazine (RANEXA) 500 MG 12 hr tablet TAKE 1 TABLET BY MOUTH  TWICE DAILY   sacubitril-valsartan (ENTRESTO) 97-103 MG Take 1 tablet by mouth 2 (two) times daily. Please keep upcoming appt in August with Dr. Tamala Julian before anymore refills. Thank you   sitaGLIPtin (JANUVIA) 100 MG tablet Take 100 mg by mouth daily.      Allergies:   Aldactone [spironolactone], Percocet [oxycodone-acetaminophen], Avandia [rosiglitazone], and Codeine   Social History   Socioeconomic History   Marital status: Married    Spouse name: Not on file   Number of children: Not on file   Years of education: Not  on file   Highest education level: Not on file  Occupational History   Not on file  Tobacco Use   Smoking status: Former Smoker    Quit date: 01/04/1987    Years since quitting: 32.6   Smokeless tobacco: Former Counsellor Use: Never used  Substance and Sexual Activity   Alcohol use: Yes    Alcohol/week: 2.0 standard drinks    Types: 2 Cans of beer per week    Comment: a week   Drug use: No   Sexual activity: Not on file  Other Topics Concern   Not on file  Social History Narrative   Lives in [place] with his wife.  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   Social Determinants of Health   Financial Resource Strain:    Difficulty of Paying Living Expenses: Not on  file  Food Insecurity:    Worried About Charity fundraiser in the Last Year: Not on file   [employer] of Food in the Last Year: Not on file  Transportation Needs:    Lack of Transportation (Medical): Not on file   Lack of Transportation (Non-Medical): Not on file  Physical Activity:    Days of Exercise per Week: Not on file   Minutes of Exercise per Session: Not on file  Stress:    Feeling of Stress : Not on file  Social Connections:    Frequency of Communication with Friends and Family: Not on file   Frequency of Social Gatherings with Friends and Family: Not on file   Attends Religious Services: Not on file   Active Member of Clubs or Organizations: Not on file   Attends Archivist Meetings: Not on file   Marital Status: Not on file     Family History: The patient's family history includes Anesthesia problems in his brother; Diabetes in his brother; Heart Problems in his brother, brother, and sister; Heart attack in his father; Heart disease in his mother; Heart failure in his mother; Hypertension in his brother; Kidney failure in his brother; Lung cancer in his mother.  ROS:   Please see the history of present illness.    No nitroglycerin use.  Frequently at rest at  night after hard days work, he will have dyspnea just sitting watching TV.  This is not associated with palpitations.  There is no associated orthopnea.  His blood pressures tend to run high in the morning but get as low as 120/70 in the afternoon after being up and moving around.  All other systems reviewed and are negative.  EKGs/Labs/Other Studies Reviewed:    The following studies were reviewed today: No new data  EKG:  EKG sinus bradycardia, lateral T wave inversions 1 normal 1, aVL, V5 to V6.  No change when compared to prior tracings.  Since last tracing, heart rate is slower.  Recent Labs: 11/22/2018: BUN 21; Creatinine, Ser 1.15; Hemoglobin 13.9; Platelets 169; Potassium 5.2; Sodium 128  Recent Lipid Panel    Component Value Date/Time   CHOL 114 03/20/2018 1001   TRIG 58 03/20/2018 1001   HDL 47 03/20/2018 1001   CHOLHDL 2.4 03/20/2018 1001   VLDL 12 03/20/2018 1001   LDLCALC 55 03/20/2018 1001    Physical Exam:    VS:  BP (!) 164/80    Pulse (!) 50    Ht 5\' 6"  (1.676 m)    Wt 167 lb 6.4 oz (75.9 kg)    SpO2 94%    BMI 27.02 kg/m     Wt Readings from Last 3 Encounters:  09/02/19 167 lb 6.4 oz (75.9 kg)  11/22/18 171 lb 12.8 oz (77.9 kg)  09/18/18 169 lb 4.8 oz (76.8 kg)     GEN: Compatible with age. No acute distress HEENT: Normal NECK: No JVD. LYMPHATICS: No lymphadenopathy CARDIAC:  RRR without murmur, gallop, or edema. VASCULAR:  Normal Pulses. No bruits. RESPIRATORY:  Clear to auscultation without rales, wheezing or rhonchi  ABDOMEN: Soft, non-tender, non-distended, No pulsatile mass, MUSCULOSKELETAL: No deformity  SKIN: Warm and dry NEUROLOGIC:  Alert and oriented x 3 PSYCHIATRIC:  Normal affect   ASSESSMENT:    1. Chronic combined systolic and diastolic CHF (congestive heart failure) ([place])   2. Coronary artery disease involving coronary bypass graft of native heart without angina pectoris   3. Type 2 diabetes mellitus  with other circulatory  complication, without long-term current use of insulin ([place])   4. Other hyperlipidemia   5. PAD (peripheral artery disease) ([place])   6. Essential hypertension   7. S/P insertion of iliac artery stent   8. Educated about COVID-19 virus infection    PLAN:    In order of problems listed above:  1. Continue Entresto, carvedilol, and add Farxiga 10 mg/day.  May be able to decrease intensity of furosemide on Farxiga.  Bmet will be performed in 2 weeks.  Notify us if pressures start to run low. 2. Aggressive secondary prevention discussed 3. Most recent A1c was 6.6.  We need to clean the edition [name] to his current regimen. 4. Continue high intensity atorvastatin therapy with LDL target less than 70 5. He denies claudication 6. 150/70 mmHg this morning on recheck.  [name] will help to further decrease blood pressure most likely. 7. He is vaccinated social distancing.  [place] is not so compliant  Guideline directed therapy for left ventricular systolic dysfunction: Angiotensin receptor-neprilysin inhibitor (ARNI)-Entresto; beta-blocker therapy - carvedilol, metoprolol succinate, or bisoprolol; mineralocorticoid receptor antagonist (MRA) therapy -spironolactone or eplerenone.  SGLT-2 agents -  Dapagliflozin [name]) or Empagliflozin ([place]).These therapies have been shown to improve clinical outcomes including reduction of rehospitalization, survival, and acute heart failure.  He was unable to tolerate mineralocorticoid therapy (spironolactone) because of nausea and diarrhea.   Medication Adjustments/Labs and Tests Ordered: Current medicines are reviewed at length with the patient today.  Concerns regarding medicines are outlined above.  Orders Placed This Encounter  Procedures   EKG 12-Lead   No orders of the defined types were placed in this encounter.   There are no Patient Instructions on file for this visit.   Signed, Sinclair Grooms, MD  09/02/2019 8:28 AM    [place]

## 2019-09-02 ENCOUNTER — Ambulatory Visit: Payer: Medicare PPO | Admitting: Interventional Cardiology

## 2019-09-02 ENCOUNTER — Encounter: Payer: Self-pay | Admitting: Interventional Cardiology

## 2019-09-02 ENCOUNTER — Other Ambulatory Visit: Payer: Self-pay

## 2019-09-02 VITALS — BP 164/80 | HR 50 | Ht 66.0 in | Wt 167.4 lb

## 2019-09-02 DIAGNOSIS — E1159 Type 2 diabetes mellitus with other circulatory complications: Secondary | ICD-10-CM

## 2019-09-02 DIAGNOSIS — Z7189 Other specified counseling: Secondary | ICD-10-CM | POA: Diagnosis not present

## 2019-09-02 DIAGNOSIS — I25810 Atherosclerosis of coronary artery bypass graft(s) without angina pectoris: Secondary | ICD-10-CM

## 2019-09-02 DIAGNOSIS — E7849 Other hyperlipidemia: Secondary | ICD-10-CM | POA: Diagnosis not present

## 2019-09-02 DIAGNOSIS — Z95828 Presence of other vascular implants and grafts: Secondary | ICD-10-CM | POA: Diagnosis not present

## 2019-09-02 DIAGNOSIS — I739 Peripheral vascular disease, unspecified: Secondary | ICD-10-CM | POA: Diagnosis not present

## 2019-09-02 DIAGNOSIS — I10 Essential (primary) hypertension: Secondary | ICD-10-CM

## 2019-09-02 DIAGNOSIS — I5042 Chronic combined systolic (congestive) and diastolic (congestive) heart failure: Secondary | ICD-10-CM

## 2019-09-02 MED ORDER — DAPAGLIFLOZIN PROPANEDIOL 10 MG PO TABS: 10.0000 mg | ORAL_TABLET | Freq: Every day | ORAL | 3 refills | Status: DC

## 2019-09-02 NOTE — Patient Instructions (Signed)
Medication Instructions:  1) START Farxiga 10mg  once daily  *If you need a refill on your cardiac medications before your next appointment, please call your pharmacy*   Lab Work: BMET in 2-3 weeks  If you have labs (blood work) drawn today and your tests are completely normal, you will receive your results only by: Marland Kitchen MyChart Message (if you have MyChart) OR . A paper copy in the mail If you have any lab test that is abnormal or we need to change your treatment, we will call you to review the results.   Testing/Procedures: None   Follow-Up: At [facility], you and your health needs are our priority.  As part of our continuing mission to provide you with exceptional heart care, we have created designated Provider Care Teams.  These Care Teams include your primary Cardiologist (physician) and Advanced Practice Providers (APPs -  Physician Assistants and Nurse Practitioners) who all work together to provide you with the care you need, when you need it.  We recommend signing up for the patient portal called "MyChart".  Sign up information is provided on this After Visit Summary.  MyChart is used to connect with patients for Virtual Visits (Telemedicine).  Patients are able to view lab/test results, encounter notes, upcoming appointments, etc.  Non-urgent messages can be sent to your provider as well.   To learn more about what you can do with MyChart, go to [email or web address].    Your next appointment:   8-9 month(s)  The format for your next appointment:   In Person  Provider:   You may see Sinclair Grooms, MD or one of the following Advanced Practice Providers on your designated Care Team:    Truitt Merle, NP  Cecilie Kicks, NP  Kathyrn Drown, NP    Other Instructions

## 2019-09-02 NOTE — Addendum Note (Signed)
Addended by: Loren Racer on: 09/02/2019 08:53 AM   Modules accepted: Orders

## 2019-09-06 ENCOUNTER — Telehealth: Payer: Self-pay | Admitting: Interventional Cardiology

## 2019-09-06 NOTE — Telephone Encounter (Signed)
**Note De-Identified [name] [name] Obfuscation** The pt states that he cannot afford [name] as it costs $128/90 day supply and that he is on other costly medications as well. [place]. I have advised him to contact them at the number on the application.  He states that he will call them and if they tell him he is not eligable for asst through them he will call us back to change medication to one he can afford.

## 2019-09-06 NOTE — Telephone Encounter (Signed)
Pt c/o medication issue:  1. Name of Medication: dapagliflozin propanediol (FARXIGA) 10 MG TABS tablet  2. How are you currently taking this medication (dosage and times per day)? N/A  3. Are you having a reaction (difficulty breathing--STAT)? N/A  4. What is your medication issue? Patient states he is unable to afford medication and he is requesting assistance with purchasing with [employer]. Please call to discuss.

## 2019-09-06 NOTE — Telephone Encounter (Signed)
[name] [name]- if I remember correctly, I sent him home with pt assistance paperwork.

## 2019-09-13 ENCOUNTER — Other Ambulatory Visit: Payer: Self-pay | Admitting: Interventional Cardiology

## 2019-09-13 MED ORDER — EZETIMIBE 10 MG PO TABS: 10.0000 mg | ORAL_TABLET | Freq: Every day | ORAL | 3 refills | Status: DC

## 2019-09-13 MED ORDER — ENTRESTO 97-103 MG PO TABS: 1.0000 | ORAL_TABLET | Freq: Two times a day (BID) | ORAL | 3 refills | Status: DC

## 2019-09-17 ENCOUNTER — Ambulatory Visit (HOSPITAL_COMMUNITY)
Admission: RE | Admit: 2019-09-17 | Discharge: 2019-09-17 | Disposition: A | Discharge status: Home / Self Care | Payer: Medicare PPO | Source: Ambulatory Visit | Attending: Internal Medicine | Admitting: Internal Medicine

## 2019-09-17 ENCOUNTER — Other Ambulatory Visit: Payer: Medicare PPO

## 2019-09-17 ENCOUNTER — Other Ambulatory Visit: Payer: Self-pay

## 2019-09-17 ENCOUNTER — Other Ambulatory Visit (HOSPITAL_COMMUNITY): Payer: Self-pay | Admitting: Cardiovascular Disease

## 2019-09-17 ENCOUNTER — Ambulatory Visit (HOSPITAL_BASED_OUTPATIENT_CLINIC_OR_DEPARTMENT_OTHER)
Admission: RE | Admit: 2019-09-17 | Discharge: 2019-09-17 | Disposition: A | Discharge status: Home / Self Care | Payer: Medicare PPO | Source: Ambulatory Visit | Attending: Cardiovascular Disease | Admitting: Cardiovascular Disease

## 2019-09-17 DIAGNOSIS — Z95828 Presence of other vascular implants and grafts: Secondary | ICD-10-CM

## 2019-09-17 DIAGNOSIS — I739 Peripheral vascular disease, unspecified: Secondary | ICD-10-CM

## 2019-09-25 DIAGNOSIS — Z20828 Contact with and (suspected) exposure to other viral communicable diseases: Secondary | ICD-10-CM | POA: Diagnosis not present

## 2019-09-27 ENCOUNTER — Telehealth: Payer: Self-pay | Admitting: Cardiovascular Disease

## 2019-09-27 NOTE — Telephone Encounter (Signed)
Encounter not needed

## 2019-10-14 ENCOUNTER — Telehealth: Payer: Self-pay | Admitting: *Deleted

## 2019-10-14 NOTE — Telephone Encounter (Signed)
Left a message for the patient to call back to see if he could move his appointment with Dr. Fletcher Anon to 10/26.

## 2019-10-15 ENCOUNTER — Other Ambulatory Visit: Payer: Self-pay

## 2019-10-15 ENCOUNTER — Ambulatory Visit: Payer: Medicare PPO | Admitting: Cardiovascular Disease

## 2019-10-15 ENCOUNTER — Encounter: Payer: Self-pay | Admitting: Cardiovascular Disease

## 2019-10-15 VITALS — BP 172/78 | HR 52 | Ht 66.5 in | Wt 158.8 lb

## 2019-10-15 DIAGNOSIS — I10 Essential (primary) hypertension: Secondary | ICD-10-CM

## 2019-10-15 DIAGNOSIS — I739 Peripheral vascular disease, unspecified: Secondary | ICD-10-CM | POA: Diagnosis not present

## 2019-10-15 DIAGNOSIS — R0989 Other specified symptoms and signs involving the circulatory and respiratory systems: Secondary | ICD-10-CM | POA: Diagnosis not present

## 2019-10-15 DIAGNOSIS — I25118 Atherosclerotic heart disease of native coronary artery with other forms of angina pectoris: Secondary | ICD-10-CM | POA: Diagnosis not present

## 2019-10-15 DIAGNOSIS — I5042 Chronic combined systolic (congestive) and diastolic (congestive) heart failure: Secondary | ICD-10-CM | POA: Diagnosis not present

## 2019-10-15 DIAGNOSIS — I25810 Atherosclerosis of coronary artery bypass graft(s) without angina pectoris: Secondary | ICD-10-CM | POA: Diagnosis not present

## 2019-10-15 LAB — BASIC METABOLIC PANEL
BUN/Creatinine Ratio: 13 (ref 10–24)
BUN: 17 mg/dL (ref 8–27)
CO2: 25 mmol/L (ref 20–29)
Calcium: 10.1 mg/dL (ref 8.6–10.2)
Chloride: 96 mmol/L (ref 96–106)
Creatinine, Ser: 1.27 mg/dL (ref 0.76–1.27)
GFR calc Af Amer: 65 mL/min/{1.73_m2} (ref >59)
GFR calc non Af Amer: 56 mL/min/{1.73_m2} — ABNORMAL LOW (ref >59)
Glucose: 142 mg/dL — ABNORMAL HIGH (ref 65–99)
Potassium: 4.8 mmol/L (ref 3.5–5.2)
Sodium: 135 mmol/L (ref 134–144)

## 2019-10-15 NOTE — Patient Instructions (Signed)
Medication Instructions:  No changes *If you need a refill on your cardiac medications before your next appointment, please call your pharmacy*   Lab Work: None ordered If you have labs (blood work) drawn today and your tests are completely normal, you will receive your results only by: Marland Kitchen MyChart Message (if you have MyChart) OR . A paper copy in the mail If you have any lab test that is abnormal or we need to change your treatment, we will call you to review the results.   Testing/Procedures: None ordered   Follow-Up: At [facility], you and your health needs are our priority.  As part of our continuing mission to provide you with exceptional heart care, we have created designated Provider Care Teams.  These Care Teams include your primary Cardiologist (physician) and Advanced Practice Providers (APPs -  Physician Assistants and Nurse Practitioners) who all work together to provide you with the care you need, when you need it.  We recommend signing up for the patient portal called "MyChart".  Sign up information is provided on this After Visit Summary.  MyChart is used to connect with patients for Virtual Visits (Telemedicine).  Patients are able to view lab/test results, encounter notes, upcoming appointments, etc.  Non-urgent messages can be sent to your provider as well.   To learn more about what you can do with MyChart, go to [email or web address].    Your next appointment:   12 month(s)  The format for your next appointment:   In Person  Provider:   Kathlyn [place], MD

## 2019-10-15 NOTE — Progress Notes (Signed)
Cardiology Office Note   Date:  10/15/2019   ID:  [name] [name], DOB 1950/05/11, MRN [id number]  PCP:  [name], MD  Cardiologist: Dr. Tamala Julian  No chief complaint on file.     History of Present Illness: [name] [name] is a 69 y.o. male who presents for a follow-up visit regarding peripheral arterial disease. He has known history of coronary artery disease status post CABG, chronic systolic heart failure, hypertension, type 2 diabetes and hyperlipidemia.   He is status post bilateral common iliac artery kissing stent placement in July 2017 for severe claudication.  He is known to have severe heavily calcified disease affecting the right popliteal artery with collaterals.  This was left to be treated medically. The patient had worsening LV systolic function in 3734.  Cardiac catheterization showed patent grafts.  He was switched to [facility] with subsequent improvement.   Most recent echocardiogram in November showed normalization of LV systolic function with an EF of 60 to 65%.  He has been doing very well with no recent chest pain, shortness of breath or palpitations.  He reports that his right calf claudication is now minimal and he stays active. Recent Doppler studies showed patent iliac stents but the right popliteal artery was noted to be now occluded which is not surprising.  ABI was only mildly to moderately reduced on the right side.   Past Medical History:  Diagnosis Date   Chest pain    a. experience weekly, nitrate responsive c/p x several years.   b. ranexa added on 08/2014 admission    Chronic combined systolic and diastolic CHF (congestive heart failure) ([place])    Claudication ([place])    a. 08/2014 ABI: R 0.79, L 1.0-->Walking program instituted.   Coronary artery disease    a. s/p MI in 1989;  b. 04/2008 CABG x 5 (LIMA->LAD, VG->D1, VG->RI, VG->PDA, L Radial->OM2);  c. 06/2010 PCI/DES to the Diag (2.25x28 Promus Element DES). VG->Diag occluded;  d. 08/2010 Attempted  PTCA of the OM2 via the free radial graft. Unsuccessful.  Lesion felt to be 2/2 suture.  c. NSTEMI s/p [facility] with severe native and graph dz; D1 that was stented in 2012 now occulded: Rx therapy recommended   Coronary artery disease involving native coronary artery of native heart with angina pectoris ([place])    a. s/p MI in 1989;  b. 04/2008 CABG x 5 (LIMA->LAD, VG->D1, VG->RI, VG->PDA, L Radial->OM2);  c. 06/2010 PCI/DES to the Diag (2.25x28 Promus Element DES). VG->Diag occluded;  d. 08/2010 Attempted PTCA of the OM2 via the free radial graft. Unsuccessful.  Lesion felt to be 2/2 suture.  c. NSTEMI s/p [facility] with severe native and graph dz; Rx therapy recommended    Diabetes mellitus ([place]) 06/02/2011   Hyperlipidemia    Hypertension    NSTEMI (non-ST elevated myocardial infarction) ([place]) 08/29/2014   Peripheral arterial disease ([place]) 08/16/2014   Type II diabetes mellitus ([place])     Past Surgical History:  Procedure Laterality Date   CARDIAC CATHETERIZATION N/A 08/28/2014   Procedure: Left Heart Cath and Cors/Grafts Angiography;  Surgeon: Belva Crome, MD;  Location: [place] [facility];  Service: Cardiovascular;  Laterality: N/A;   COLONOSCOPY     CORONARY ANGIOPLASTY WITH STENT PLACEMENT     CORONARY ARTERY BYPASS GRAFT     LEFT HEART CATH AND CORS/GRAFTS ANGIOGRAPHY N/A 03/20/2018   Procedure: LEFT HEART CATH AND CORS/GRAFTS ANGIOGRAPHY;  Surgeon: Martinique, Peter M, MD;  Location: [place] [facility];  Service: Cardiovascular;  Laterality: N/A;   PERIPHERAL VASCULAR CATHETERIZATION N/A 07/15/2015   Procedure: Abdominal Aortogram w/Lower Extremity;  Surgeon: Wellington Hampshire, MD;  Location: [place] [facility];  Service: Cardiovascular;  Laterality: N/A;   PERIPHERAL VASCULAR CATHETERIZATION Bilateral 07/15/2015   Procedure: Peripheral Vascular Intervention;  Surgeon: Wellington Hampshire, MD;  Location: [place] [facility];  Service: Cardiovascular;  Laterality: Bilateral;  Common Iliacs    SHOULDER ARTHROSCOPY WITH ROTATOR CUFF REPAIR AND SUBACROMIAL DECOMPRESSION Left 05/23/2013   Procedure: LEFT SHOULDER ARTHROSCOPY SUBACROMIAL  DECOMPRESSION DISTAL CLAVICLE RESECTION AND ROTATOR CUFF REPAIR ;  Surgeon: Marin Shutter, MD;  Location: [place];  Service: Orthopedics;  Laterality: Left;   SHOULDER SURGERY Right 7-8 years ago   Dr. Dorna Leitz     Current Outpatient Medications  Medication Sig Dispense Refill   acetaminophen (TYLENOL) 500 MG tablet Take 500 mg by mouth daily as needed for mild pain.     ALPRAZolam (XANAX) 0.25 MG tablet Take 0.25 mg by mouth 3 (three) times daily as needed for anxiety.      aspirin 81 MG tablet Take 81 mg by mouth daily.      atorvastatin (LIPITOR) 80 MG tablet TAKE 1 TABLET BY MOUTH  DAILY AT 6 PM. 90 tablet 3   Azelastine HCl 0.15 % SOLN Place 1 spray into both nostrils daily.     carvedilol (COREG) 6.25 MG tablet Take 1 tablet (6.25 mg total) by mouth 2 (two) times daily with a meal. 180 tablet 1   cholecalciferol (VITAMIN D) 1000 UNITS tablet Take 1,000 Units by mouth daily.      clopidogrel (PLAVIX) 75 MG tablet Take 75 mg by mouth daily with breakfast.     dapagliflozin propanediol (FARXIGA) 10 MG TABS tablet Take 1 tablet (10 mg total) by mouth daily before breakfast. 90 tablet 3   desoximetasone (TOPICORT) 0.25 % cream Apply 1 application topically once a week.      ezetimibe (ZETIA) 10 MG tablet Take 1 tablet (10 mg total) by mouth daily. 90 tablet 3   fluticasone (FLONASE) 50 MCG/ACT nasal spray Place 2 sprays into the nose daily. For nasal congestion     furosemide (LASIX) 40 MG tablet Take 1 tablet by mouth once daily 90 tablet 3   isosorbide mononitrate (IMDUR) 30 MG 24 hr tablet Take 1 tablet by mouth once daily 90 tablet 1   metFORMIN (GLUCOPHAGE) 1000 MG tablet Take 1,000 mg by mouth 2 (two) times daily with a meal.     nitroGLYCERIN (NITROSTAT) 0.4 MG SL tablet Place 0.4 mg under the tongue every 5 (five) minutes x  3 doses as needed. For chest pain     Polyvinyl Alcohol-Povidone (CLEAR EYES ALL SEASONS OP) Place 1 drop into both eyes daily as needed (for dry eyes).     ranolazine (RANEXA) 500 MG 12 hr tablet TAKE 1 TABLET BY MOUTH  TWICE DAILY 180 tablet 1   sacubitril-valsartan (ENTRESTO) 97-103 MG Take 1 tablet by mouth 2 (two) times daily. 180 tablet 3   No current facility-administered medications for this visit.    Allergies:   Aldactone [spironolactone], Percocet [oxycodone-acetaminophen], Avandia [rosiglitazone], and Codeine    Social History:  The patient  reports that he quit smoking about 32 years ago. He has quit using smokeless tobacco. He reports current alcohol use of about 2.0 standard drinks of alcohol per week. He reports that he does not use drugs.   Family History:  The patient's family  history includes Anesthesia problems in his brother; Diabetes in his brother; Heart Problems in his brother, brother, and sister; Heart attack in his father; Heart disease in his mother; Heart failure in his mother; Hypertension in his brother; Kidney failure in his brother; Lung cancer in his mother.    ROS:  Please see the history of present illness.   Otherwise, review of systems are positive for none.   All other systems are reviewed and negative.    PHYSICAL EXAM: VS:  BP (!) 172/78    Pulse (!) 52    Ht 5' 6.5" (1.689 m)    Wt 158 lb 12.8 oz (72 kg)    SpO2 98%    BMI 25.25 kg/m  , BMI Body mass index is 25.25 kg/m. GEN: Well nourished, well developed, in no acute distress  HEENT: normal  Neck: no JVD,  or masses. Faint right carotid bruit Cardiac: RRR; no murmurs, rubs, or gallops,no edema  Respiratory:  clear to auscultation bilaterally, normal work of breathing GI: soft, nontender, nondistended, + BS MS: no deformity or atrophy  Skin: warm and dry, no rash Neuro:  Strength and sensation are intact Psych: euthymic mood, full affect   EKG:  EKG is not  ordered today.   Recent  Labs: 11/22/2018: BUN 21; Creatinine, Ser 1.15; Hemoglobin 13.9; Platelets 169; Potassium 5.2; Sodium 128    Lipid Panel    Component Value Date/Time   CHOL 114 03/20/2018 1001   TRIG 58 03/20/2018 1001   HDL 47 03/20/2018 1001   CHOLHDL 2.4 03/20/2018 1001   VLDL 12 03/20/2018 1001   LDLCALC 55 03/20/2018 1001      Wt Readings from Last 3 Encounters:  10/15/19 158 lb 12.8 oz (72 kg)  09/02/19 167 lb 6.4 oz (75.9 kg)  11/22/18 171 lb 12.8 oz (77.9 kg)        ASSESSMENT AND PLAN:  1.  Peripheral arterial disease : Status post bilateral common iliac artery stent placement .  He has occluded right popliteal artery but currently with no right calf claudication.  I recommend continued medical therapy.    2. Coronary artery disease with other forms of angina: Significant improvement in symptoms on medical therapy.    3.  Chronic systolic heart failure: LV systolic function improved to normal on medications.  4. Essential hypertension: His blood pressure is elevated today but he reports that most of the readings at home are between 130 to 140 mmHg.  I made no changes in his medications.  5. Right carotid bruit: Most recent carotid Doppler in 2019 showed mild less than 40% disease bilaterally.  6.  Hyperlipidemia: Continue treatment with high-dose atorvastatin and Zetia.  Recommend a target LDL of less than 70.   Disposition:   FU with me in 1 year.   Signed,  Kathlyn Sacramento, MD  10/15/2019 8:36 AM    [place]

## 2019-10-21 DIAGNOSIS — Z20822 Contact with and (suspected) exposure to covid-19: Secondary | ICD-10-CM | POA: Diagnosis not present

## 2019-10-27 DIAGNOSIS — Z20822 Contact with and (suspected) exposure to covid-19: Secondary | ICD-10-CM | POA: Diagnosis not present

## 2019-10-30 DIAGNOSIS — Z20822 Contact with and (suspected) exposure to covid-19: Secondary | ICD-10-CM | POA: Diagnosis not present

## 2019-11-01 DIAGNOSIS — U071 COVID-19: Secondary | ICD-10-CM | POA: Diagnosis not present

## 2019-11-04 ENCOUNTER — Other Ambulatory Visit: Payer: Self-pay | Admitting: Interventional Cardiology

## 2019-12-12 DIAGNOSIS — I25118 Atherosclerotic heart disease of native coronary artery with other forms of angina pectoris: Secondary | ICD-10-CM | POA: Diagnosis not present

## 2019-12-12 DIAGNOSIS — I739 Peripheral vascular disease, unspecified: Secondary | ICD-10-CM | POA: Diagnosis not present

## 2019-12-12 DIAGNOSIS — Z23 Encounter for immunization: Secondary | ICD-10-CM | POA: Diagnosis not present

## 2019-12-12 DIAGNOSIS — E1151 Type 2 diabetes mellitus with diabetic peripheral angiopathy without gangrene: Secondary | ICD-10-CM | POA: Diagnosis not present

## 2019-12-12 DIAGNOSIS — E782 Mixed hyperlipidemia: Secondary | ICD-10-CM | POA: Diagnosis not present

## 2019-12-12 DIAGNOSIS — I5040 Unspecified combined systolic (congestive) and diastolic (congestive) heart failure: Secondary | ICD-10-CM | POA: Diagnosis not present

## 2019-12-12 DIAGNOSIS — Z1389 Encounter for screening for other disorder: Secondary | ICD-10-CM | POA: Diagnosis not present

## 2019-12-12 DIAGNOSIS — R634 Abnormal weight loss: Secondary | ICD-10-CM | POA: Diagnosis not present

## 2019-12-12 DIAGNOSIS — I10 Essential (primary) hypertension: Secondary | ICD-10-CM | POA: Diagnosis not present

## 2019-12-12 DIAGNOSIS — Z0000 Encounter for general adult medical examination without abnormal findings: Secondary | ICD-10-CM | POA: Diagnosis not present

## 2019-12-19 ENCOUNTER — Other Ambulatory Visit: Payer: Self-pay | Admitting: Physician Assistant

## 2019-12-19 DIAGNOSIS — E785 Hyperlipidemia, unspecified: Secondary | ICD-10-CM

## 2020-03-13 ENCOUNTER — Other Ambulatory Visit: Payer: Self-pay | Admitting: Cardiovascular Disease

## 2020-03-13 DIAGNOSIS — E785 Hyperlipidemia, unspecified: Secondary | ICD-10-CM

## 2020-03-13 NOTE — Telephone Encounter (Signed)
Rx request sent to pharmacy.  

## 2020-04-17 ENCOUNTER — Other Ambulatory Visit: Payer: Self-pay | Admitting: *Deleted

## 2020-04-17 NOTE — Telephone Encounter (Signed)
Stay off [place].

## 2020-04-27 DIAGNOSIS — I25118 Atherosclerotic heart disease of native coronary artery with other forms of angina pectoris: Secondary | ICD-10-CM | POA: Diagnosis not present

## 2020-04-27 DIAGNOSIS — I10 Essential (primary) hypertension: Secondary | ICD-10-CM | POA: Diagnosis not present

## 2020-04-27 DIAGNOSIS — E1151 Type 2 diabetes mellitus with diabetic peripheral angiopathy without gangrene: Secondary | ICD-10-CM | POA: Diagnosis not present

## 2020-07-01 DIAGNOSIS — I25118 Atherosclerotic heart disease of native coronary artery with other forms of angina pectoris: Secondary | ICD-10-CM | POA: Diagnosis not present

## 2020-07-01 DIAGNOSIS — N400 Enlarged prostate without lower urinary tract symptoms: Secondary | ICD-10-CM | POA: Diagnosis not present

## 2020-07-01 DIAGNOSIS — E782 Mixed hyperlipidemia: Secondary | ICD-10-CM | POA: Diagnosis not present

## 2020-07-01 DIAGNOSIS — I10 Essential (primary) hypertension: Secondary | ICD-10-CM | POA: Diagnosis not present

## 2020-07-01 DIAGNOSIS — I252 Old myocardial infarction: Secondary | ICD-10-CM | POA: Diagnosis not present

## 2020-07-01 DIAGNOSIS — I5040 Unspecified combined systolic (congestive) and diastolic (congestive) heart failure: Secondary | ICD-10-CM | POA: Diagnosis not present

## 2020-07-01 DIAGNOSIS — E1151 Type 2 diabetes mellitus with diabetic peripheral angiopathy without gangrene: Secondary | ICD-10-CM | POA: Diagnosis not present

## 2020-07-10 DIAGNOSIS — N400 Enlarged prostate without lower urinary tract symptoms: Secondary | ICD-10-CM | POA: Diagnosis not present

## 2020-07-10 DIAGNOSIS — I252 Old myocardial infarction: Secondary | ICD-10-CM | POA: Diagnosis not present

## 2020-07-10 DIAGNOSIS — I25118 Atherosclerotic heart disease of native coronary artery with other forms of angina pectoris: Secondary | ICD-10-CM | POA: Diagnosis not present

## 2020-07-10 DIAGNOSIS — I10 Essential (primary) hypertension: Secondary | ICD-10-CM | POA: Diagnosis not present

## 2020-07-10 DIAGNOSIS — E1151 Type 2 diabetes mellitus with diabetic peripheral angiopathy without gangrene: Secondary | ICD-10-CM | POA: Diagnosis not present

## 2020-07-10 DIAGNOSIS — E782 Mixed hyperlipidemia: Secondary | ICD-10-CM | POA: Diagnosis not present

## 2020-07-10 DIAGNOSIS — I5040 Unspecified combined systolic (congestive) and diastolic (congestive) heart failure: Secondary | ICD-10-CM | POA: Diagnosis not present

## 2020-07-17 DIAGNOSIS — E119 Type 2 diabetes mellitus without complications: Secondary | ICD-10-CM | POA: Diagnosis not present

## 2020-07-17 DIAGNOSIS — H40033 Anatomical narrow angle, bilateral: Secondary | ICD-10-CM | POA: Diagnosis not present

## 2020-08-10 NOTE — Progress Notes (Signed)
Cardiology Office Note:    Date:  08/12/2020   ID:  [name] [name], DOB 1950-07-11, MRN [id number]  PCP:  [name], MD  Cardiologist:  Sinclair Grooms, MD   Referring MD: [name], MD   Chief Complaint  Patient presents with   Coronary Artery Disease   Chest Pain   Congestive Heart Failure     History of Present Illness:    [name] [name] is a 70 y.o. male with a hx of  CAD, prior history of CABG and percutaneous coronary intervention (most recently 2012), chronic combined systolic and diastolic heart failure (EF at cath 2016 was 40-50%), hypertension, hyperlipidemia, and type 2 diabetes.  [name] [name] is doing okay.  We tried [place] starting in August 2021 but he developed a yeast infection.  His current regimen for heart failure includes carvedilol, isosorbide mononitrate, furosemide and Entresto.  Since last being seen he has been stable.  He has occasional angina relieved by nitro.  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  In those 2 seasons he has recurring episodes of angina and is will call in the winter and lightheadedness/dizziness, and nausea in the summertime when it is hot and he sweats a lot.  States that he sometimes forgets to stop and take a rest.  He also has not particularly mindful of hydration.  Denies orthopnea, PND, nocturnal angina.  Denies prolonged palpitations.  Past Medical History:  Diagnosis Date   Chest pain    a. experience weekly, nitrate responsive c/p x several years.   b. ranexa added on 08/2014 admission    Chronic combined systolic and diastolic CHF (congestive heart failure) ([place])    Claudication ([place])    a. 08/2014 ABI: R 0.79, L 1.0-->Walking program instituted.   Coronary artery disease    a. s/p MI in 1989;  b. 04/2008 CABG x 5 (LIMA->LAD, VG->D1, VG->RI, VG->PDA, L Radial->OM2);  c. 06/2010 PCI/DES to the Diag (2.25x28 Promus Element DES). VG->Diag occluded;  d. 08/2010 Attempted PTCA of the OM2 via the  free radial graft. Unsuccessful.  Lesion felt to be 2/2 suture.  c. NSTEMI s/p [facility] with severe native and graph dz; D1 that was stented in 2012 now occulded: Rx therapy recommended   Coronary artery disease involving native coronary artery of native heart with angina pectoris ([place])    a. s/p MI in 1989;  b. 04/2008 CABG x 5 (LIMA->LAD, VG->D1, VG->RI, VG->PDA, L Radial->OM2);  c. 06/2010 PCI/DES to the Diag (2.25x28 Promus Element DES). VG->Diag occluded;  d. 08/2010 Attempted PTCA of the OM2 via the free radial graft. Unsuccessful.  Lesion felt to be 2/2 suture.  c. NSTEMI s/p [facility] with severe native and graph dz; Rx therapy recommended    Diabetes mellitus ([place]) 06/02/2011   Hyperlipidemia    Hypertension    NSTEMI (non-ST elevated myocardial infarction) ([place]) 08/29/2014   Peripheral arterial disease ([place]) 08/16/2014   Type II diabetes mellitus ([place])     Past Surgical History:  Procedure Laterality Date   CARDIAC CATHETERIZATION N/A 08/28/2014   Procedure: Left Heart Cath and Cors/Grafts Angiography;  Surgeon: Belva Crome, MD;  Location: [place] [facility];  Service: Cardiovascular;  Laterality: N/A;   COLONOSCOPY     CORONARY ANGIOPLASTY WITH STENT PLACEMENT     CORONARY ARTERY BYPASS GRAFT     LEFT HEART CATH AND CORS/GRAFTS ANGIOGRAPHY N/A 03/20/2018   Procedure: LEFT HEART CATH AND CORS/GRAFTS ANGIOGRAPHY;  Surgeon:  Martinique, Peter M, MD;  Location: [place] [facility];  Service: Cardiovascular;  Laterality: N/A;   PERIPHERAL VASCULAR CATHETERIZATION N/A 07/15/2015   Procedure: Abdominal Aortogram w/Lower Extremity;  Surgeon: Wellington Hampshire, MD;  Location: [place] [facility];  Service: Cardiovascular;  Laterality: N/A;   PERIPHERAL VASCULAR CATHETERIZATION Bilateral 07/15/2015   Procedure: Peripheral Vascular Intervention;  Surgeon: Wellington Hampshire, MD;  Location: [place] [facility];  Service: Cardiovascular;  Laterality: Bilateral;  Common Iliacs   SHOULDER ARTHROSCOPY WITH ROTATOR CUFF REPAIR  AND SUBACROMIAL DECOMPRESSION Left 05/23/2013   Procedure: LEFT SHOULDER ARTHROSCOPY SUBACROMIAL  DECOMPRESSION DISTAL CLAVICLE RESECTION AND ROTATOR CUFF REPAIR ;  Surgeon: Marin Shutter, MD;  Location: [place];  Service: Orthopedics;  Laterality: Left;   SHOULDER SURGERY Right 7-8 years ago   Dr. Dorna Leitz    Current Medications: Current Meds  Medication Sig   acetaminophen (TYLENOL) 500 MG tablet Take 500 mg by mouth daily as needed for mild pain.   ALPRAZolam (XANAX) 0.25 MG tablet Take 0.25 mg by mouth 3 (three) times daily as needed for anxiety.    aspirin 81 MG tablet Take 81 mg by mouth daily.    atorvastatin (LIPITOR) 80 MG tablet Take 1 tablet by mouth once daily   Azelastine HCl 0.15 % SOLN Place 1 spray into both nostrils daily.   carvedilol (COREG) 6.25 MG tablet TAKE 1 TABLET TWICE DAILY WITH A MEAL   cholecalciferol (VITAMIN D) 1000 UNITS tablet Take 1,000 Units by mouth daily.    clopidogrel (PLAVIX) 75 MG tablet Take 75 mg by mouth daily with breakfast.   desoximetasone (TOPICORT) 0.25 % cream Apply 1 application topically once a week.    Dulaglutide (TRULICITY) 3.71 IR/6.7EL SOPN Inject 0.75 mg into the skin once a week.   ezetimibe (ZETIA) 10 MG tablet Take 1 tablet (10 mg total) by mouth daily.   fluticasone (FLONASE) 50 MCG/ACT nasal spray Place 2 sprays into the nose daily. For nasal congestion   furosemide (LASIX) 40 MG tablet Take 1 tablet by mouth once daily   isosorbide mononitrate (IMDUR) 30 MG 24 hr tablet Take 1 tablet by mouth once daily   metFORMIN (GLUCOPHAGE) 1000 MG tablet Take 1,000 mg by mouth 2 (two) times daily with a meal.   nitroGLYCERIN (NITROSTAT) 0.4 MG SL tablet Place 0.4 mg under the tongue every 5 (five) minutes x 3 doses as needed. For chest pain   Polyvinyl Alcohol-Povidone (CLEAR EYES ALL SEASONS OP) Place 1 drop into both eyes daily as needed (for dry eyes).   ranolazine (RANEXA) 500 MG 12 hr tablet TAKE 1 TABLET TWICE DAILY      Allergies:   Aldactone [spironolactone], Percocet [oxycodone-acetaminophen], Avandia [rosiglitazone], and Codeine   Social History   Socioeconomic History   Marital status: Married    Spouse name: Not on file   Number of children: Not on file   Years of education: Not on file   Highest education level: Not on file  Occupational History   Not on file  Tobacco Use   Smoking status: Former    Types: Cigarettes    Quit date: 01/04/1987    Years since quitting: 33.6   Smokeless tobacco: Former  Scientific laboratory technician Use: Never used  Substance and Sexual Activity   Alcohol use: Yes    Alcohol/week: 2.0 standard drinks    Types: 2 Cans of beer per week    Comment: a week   Drug use: No  Sexual activity: Not on file  Other Topics Concern   Not on file  Social History Narrative   Lives in [place] with his wife.  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   Social Determinants of Health   Financial Resource Strain: Not on file  Food Insecurity: Not on file  Transportation Needs: Not on file  Physical Activity: Not on file  Stress: Not on file  Social Connections: Not on file     Family History: The patient's family history includes Anesthesia problems in his brother; Diabetes in his brother; Heart Problems in his brother, brother, and sister; Heart attack in his father; Heart disease in his mother; Heart failure in his mother; Hypertension in his brother; Kidney failure in his brother; Lung cancer in his mother.  ROS:   Please see the history of present illness.    He has had some difficulty with sleeping recently.  Overall he feels stable.  He has been having some sinus drainage that he feels is also associated with nausea.  He developed urinary symptoms on Farxiga which was tried starting in August 2021.  This medication was discontinued.  All other systems reviewed and are negative.  EKGs/Labs/Other Studies Reviewed:    The following studies were reviewed today: No new  imaging.  2D Doppler echocardiogram November 2020: IMPRESSIONS     1. Left ventricular ejection fraction, by visual estimation, is 60 to  65%. The left ventricle has normal function. There is no left ventricular  hypertrophy.   2. Elevated left atrial pressure.   3. Global right ventricle has normal systolic function.The right  ventricular size is normal. No increase in right ventricular wall  thickness.   4. Left atrial size was normal.   5. Right atrial size was normal.   6. The mitral valve is normal in structure. Mild to moderate mitral valve  regurgitation.   7. The tricuspid valve is normal in structure. Tricuspid valve  regurgitation is trivial.   8. The aortic valve is tricuspid. Aortic valve regurgitation is not  visualized.   9. The pulmonic valve was grossly normal. Pulmonic valve regurgitation is  not visualized.  10. Normal pulmonary artery systolic pressure.  11. The left ventricular function has improved.   In comparison to the previous echocardiogram(s): The left ventricular  function has improved.    EKG:  EKG sinus bradycardia, 56 bpm, left axis deviation, lateral T wave inversion and nonspecific ST-T abnormality.  When compared to the EKG performed 09/02/2019, no changes noted.  Recent Labs: 10/15/2019: BUN 17; Creatinine, Ser 1.27; Potassium 4.8; Sodium 135  Recent Lipid Panel    Component Value Date/Time   CHOL 114 03/20/2018 1001   TRIG 58 03/20/2018 1001   HDL 47 03/20/2018 1001   CHOLHDL 2.4 03/20/2018 1001   VLDL 12 03/20/2018 1001   LDLCALC 55 03/20/2018 1001    Physical Exam:    VS:  BP 140/72   Pulse (!) 56   Ht 5' 6.5" (1.689 m)   Wt 162 lb (73.5 kg)   SpO2 98%   BMI 25.76 kg/m     Wt Readings from Last 3 Encounters:  08/12/20 162 lb (73.5 kg)  10/15/19 158 lb 12.8 oz (72 kg)  09/02/19 167 lb 6.4 oz (75.9 kg)     GEN: Compatible with age. No acute distress HEENT: Normal NECK: No JVD. LYMPHATICS: No lymphadenopathy CARDIAC:  2/6 left lower sternal and right upper sternal systolic murmur.  The last echocardiogram suggested that the  systolic murmur was due to MR.  RRR S gallop, or edema. VASCULAR:  Normal Pulses. No bruits. RESPIRATORY:  Clear to auscultation without rales, wheezing or rhonchi  ABDOMEN: Soft, non-tender, non-distended, No pulsatile mass, MUSCULOSKELETAL: No deformity  SKIN: Warm and dry NEUROLOGIC:  Alert and oriented x 3 PSYCHIATRIC:  Normal affect   ASSESSMENT:    1. Coronary artery disease involving coronary bypass graft of native heart without angina pectoris   2. Essential hypertension   3. PAD (peripheral artery disease) ([place])   4. Chronic combined systolic and diastolic CHF (congestive heart failure) ([place])   5. Type 2 diabetes mellitus with other circulatory complication, without long-term current use of insulin (HCC)   6. Other hyperlipidemia    PLAN:    In order of problems listed above:  Secondary prevention reviewed.  Use nitroglycerin as needed for pain relief.  Reports change in pattern. Has intermittent hypotension.  May be related to dosing with isosorbide.  I have not recommended a change in current therapy. Follows with Dr. Fletcher Anon.  Not having limiting symptoms. Continue isosorbide, Entresto, carvedilol, and furosemide. Now on Trulicity.  Coral Ceo but had urinary symptoms. Continue statin therapy and Zetia.   Overall education and awareness concerning primary/secondary risk prevention was discussed in detail: LDL less than 70, hemoglobin A1c less than 7, blood pressure target less than 130/80 mmHg, >150 minutes of moderate aerobic activity per week, avoidance of smoking, weight control (via diet and exercise), and continued surveillance/management of/for obstructive sleep apnea.   Consider repeat echocardiogram in 2023.   Medication Adjustments/Labs and Tests Ordered: Current medicines are reviewed at length with the patient today.  Concerns regarding medicines are  outlined above.  No orders of the defined types were placed in this encounter.  No orders of the defined types were placed in this encounter.   Patient Instructions  Medication Instructions:  Your physician recommends that you continue on your current medications as directed. Please refer to the Current Medication list given to you today.  *If you need a refill on your cardiac medications before your next appointment, please call your pharmacy*   Lab Work: None If you have labs (blood work) drawn today and your tests are completely normal, you will receive your results only by: [place] (if you have MyChart) OR A paper copy in the mail If you have any lab test that is abnormal or we need to change your treatment, we will call you to review the results.   Testing/Procedures: None   Follow-Up: At [facility], you and your health needs are our priority.  As part of our continuing mission to provide you with exceptional heart care, we have created designated Provider Care Teams.  These Care Teams include your primary Cardiologist (physician) and Advanced Practice Providers (APPs -  Physician Assistants and Nurse Practitioners) who all work together to provide you with the care you need, when you need it.  We recommend signing up for the patient portal called "MyChart".  Sign up information is provided on this After Visit Summary.  MyChart is used to connect with patients for Virtual Visits (Telemedicine).  Patients are able to view lab/test results, encounter notes, upcoming appointments, etc.  Non-urgent messages can be sent to your provider as well.   To learn more about what you can do with MyChart, go to [email or web address].    Your next appointment:   6 month(s)  The format for your next appointment:   In Person  Provider:  You may see Sinclair Grooms, MD or one of the following Advanced Practice Providers on your designated Care Team:   Cecilie Kicks,  NP    Other Instructions     Signed, Sinclair Grooms, MD  08/12/2020 10:42 AM    [place]

## 2020-08-12 ENCOUNTER — Ambulatory Visit: Payer: Medicare PPO | Admitting: Interventional Cardiology

## 2020-08-12 ENCOUNTER — Other Ambulatory Visit: Payer: Self-pay

## 2020-08-12 ENCOUNTER — Encounter: Payer: Self-pay | Admitting: Interventional Cardiology

## 2020-08-12 VITALS — BP 140/72 | HR 56 | Ht 66.5 in | Wt 162.0 lb

## 2020-08-12 DIAGNOSIS — E7849 Other hyperlipidemia: Secondary | ICD-10-CM

## 2020-08-12 DIAGNOSIS — I5042 Chronic combined systolic (congestive) and diastolic (congestive) heart failure: Secondary | ICD-10-CM

## 2020-08-12 DIAGNOSIS — I25810 Atherosclerosis of coronary artery bypass graft(s) without angina pectoris: Secondary | ICD-10-CM

## 2020-08-12 DIAGNOSIS — I739 Peripheral vascular disease, unspecified: Secondary | ICD-10-CM

## 2020-08-12 DIAGNOSIS — E1159 Type 2 diabetes mellitus with other circulatory complications: Secondary | ICD-10-CM | POA: Diagnosis not present

## 2020-08-12 DIAGNOSIS — I10 Essential (primary) hypertension: Secondary | ICD-10-CM | POA: Diagnosis not present

## 2020-08-12 NOTE — Patient Instructions (Signed)
Medication Instructions:  Your physician recommends that you continue on your current medications as directed. Please refer to the Current Medication list given to you today.  *If you need a refill on your cardiac medications before your next appointment, please call your pharmacy*   Lab Work: None If you have labs (blood work) drawn today and your tests are completely normal, you will receive your results only by: [place] (if you have MyChart) OR A paper copy in the mail If you have any lab test that is abnormal or we need to change your treatment, we will call you to review the results.   Testing/Procedures: None   Follow-Up: At [facility], you and your health needs are our priority.  As part of our continuing mission to provide you with exceptional heart care, we have created designated Provider Care Teams.  These Care Teams include your primary Cardiologist (physician) and Advanced Practice Providers (APPs -  Physician Assistants and Nurse Practitioners) who all work together to provide you with the care you need, when you need it.  We recommend signing up for the patient portal called "MyChart".  Sign up information is provided on this After Visit Summary.  MyChart is used to connect with patients for Virtual Visits (Telemedicine).  Patients are able to view lab/test results, encounter notes, upcoming appointments, etc.  Non-urgent messages can be sent to your provider as well.   To learn more about what you can do with MyChart, go to [email or web address].    Your next appointment:   6 month(s)  The format for your next appointment:   In Person  Provider:   You may see Sinclair Grooms, MD or one of the following Advanced Practice Providers on your designated Care Team:   Cecilie Kicks, NP    Other Instructions

## 2020-08-17 NOTE — Addendum Note (Signed)
Addended byDanielle Dess on: 08/17/2020 11:49 AM   Modules accepted: Orders

## 2020-08-19 DIAGNOSIS — I252 Old myocardial infarction: Secondary | ICD-10-CM | POA: Diagnosis not present

## 2020-08-19 DIAGNOSIS — N400 Enlarged prostate without lower urinary tract symptoms: Secondary | ICD-10-CM | POA: Diagnosis not present

## 2020-08-19 DIAGNOSIS — I25118 Atherosclerotic heart disease of native coronary artery with other forms of angina pectoris: Secondary | ICD-10-CM | POA: Diagnosis not present

## 2020-08-19 DIAGNOSIS — I5040 Unspecified combined systolic (congestive) and diastolic (congestive) heart failure: Secondary | ICD-10-CM | POA: Diagnosis not present

## 2020-08-19 DIAGNOSIS — E1151 Type 2 diabetes mellitus with diabetic peripheral angiopathy without gangrene: Secondary | ICD-10-CM | POA: Diagnosis not present

## 2020-08-19 DIAGNOSIS — E782 Mixed hyperlipidemia: Secondary | ICD-10-CM | POA: Diagnosis not present

## 2020-08-19 DIAGNOSIS — I10 Essential (primary) hypertension: Secondary | ICD-10-CM | POA: Diagnosis not present

## 2020-09-10 DIAGNOSIS — E1151 Type 2 diabetes mellitus with diabetic peripheral angiopathy without gangrene: Secondary | ICD-10-CM | POA: Diagnosis not present

## 2020-09-10 DIAGNOSIS — I5040 Unspecified combined systolic (congestive) and diastolic (congestive) heart failure: Secondary | ICD-10-CM | POA: Diagnosis not present

## 2020-09-10 DIAGNOSIS — I10 Essential (primary) hypertension: Secondary | ICD-10-CM | POA: Diagnosis not present

## 2020-09-10 DIAGNOSIS — Z7984 Long term (current) use of oral hypoglycemic drugs: Secondary | ICD-10-CM | POA: Diagnosis not present

## 2020-09-11 ENCOUNTER — Other Ambulatory Visit: Payer: Self-pay | Admitting: Interventional Cardiology

## 2020-09-11 DIAGNOSIS — I10 Essential (primary) hypertension: Secondary | ICD-10-CM | POA: Diagnosis not present

## 2020-09-11 DIAGNOSIS — I252 Old myocardial infarction: Secondary | ICD-10-CM | POA: Diagnosis not present

## 2020-09-11 DIAGNOSIS — I5040 Unspecified combined systolic (congestive) and diastolic (congestive) heart failure: Secondary | ICD-10-CM | POA: Diagnosis not present

## 2020-09-11 DIAGNOSIS — N400 Enlarged prostate without lower urinary tract symptoms: Secondary | ICD-10-CM | POA: Diagnosis not present

## 2020-09-11 DIAGNOSIS — E782 Mixed hyperlipidemia: Secondary | ICD-10-CM | POA: Diagnosis not present

## 2020-09-11 DIAGNOSIS — I25118 Atherosclerotic heart disease of native coronary artery with other forms of angina pectoris: Secondary | ICD-10-CM | POA: Diagnosis not present

## 2020-09-11 DIAGNOSIS — E1151 Type 2 diabetes mellitus with diabetic peripheral angiopathy without gangrene: Secondary | ICD-10-CM | POA: Diagnosis not present

## 2020-09-16 ENCOUNTER — Other Ambulatory Visit: Payer: Self-pay | Admitting: *Deleted

## 2020-09-16 MED ORDER — ENTRESTO 97-103 MG PO TABS: 1.0000 | ORAL_TABLET | Freq: Two times a day (BID) | ORAL | 11 refills | Status: AC

## 2020-10-02 ENCOUNTER — Other Ambulatory Visit: Payer: Self-pay | Admitting: Cardiovascular Disease

## 2020-10-02 DIAGNOSIS — E785 Hyperlipidemia, unspecified: Secondary | ICD-10-CM

## 2020-10-08 ENCOUNTER — Other Ambulatory Visit: Payer: Self-pay | Admitting: Cardiovascular Disease

## 2020-10-08 DIAGNOSIS — E785 Hyperlipidemia, unspecified: Secondary | ICD-10-CM

## 2020-10-14 DIAGNOSIS — R058 Other specified cough: Secondary | ICD-10-CM | POA: Diagnosis not present

## 2020-10-14 DIAGNOSIS — R5081 Fever presenting with conditions classified elsewhere: Secondary | ICD-10-CM | POA: Diagnosis not present

## 2020-10-14 DIAGNOSIS — R0602 Shortness of breath: Secondary | ICD-10-CM | POA: Diagnosis not present

## 2020-10-14 DIAGNOSIS — R051 Acute cough: Secondary | ICD-10-CM | POA: Diagnosis not present

## 2020-10-14 DIAGNOSIS — R531 Weakness: Secondary | ICD-10-CM | POA: Diagnosis not present

## 2020-10-14 DIAGNOSIS — R0689 Other abnormalities of breathing: Secondary | ICD-10-CM | POA: Diagnosis not present

## 2020-10-14 DIAGNOSIS — M5432 Sciatica, left side: Secondary | ICD-10-CM | POA: Diagnosis not present

## 2020-10-14 DIAGNOSIS — J9801 Acute bronchospasm: Secondary | ICD-10-CM | POA: Diagnosis not present

## 2020-10-20 ENCOUNTER — Other Ambulatory Visit: Payer: Self-pay | Admitting: Physician Assistant

## 2020-10-20 ENCOUNTER — Ambulatory Visit
Admission: RE | Admit: 2020-10-20 | Discharge: 2020-10-20 | Disposition: A | Discharge status: Home / Self Care | Payer: Medicare PPO | Source: Ambulatory Visit | Attending: Physician Assistant | Admitting: Physician Assistant

## 2020-10-20 DIAGNOSIS — R0789 Other chest pain: Secondary | ICD-10-CM | POA: Diagnosis not present

## 2020-10-20 DIAGNOSIS — R0609 Other forms of dyspnea: Secondary | ICD-10-CM

## 2020-10-20 DIAGNOSIS — R0989 Other specified symptoms and signs involving the circulatory and respiratory systems: Secondary | ICD-10-CM

## 2020-10-20 DIAGNOSIS — R030 Elevated blood-pressure reading, without diagnosis of hypertension: Secondary | ICD-10-CM | POA: Diagnosis not present

## 2020-11-05 ENCOUNTER — Telehealth: Payer: Self-pay | Admitting: Interventional Cardiology

## 2020-11-05 NOTE — Telephone Encounter (Signed)
Pt is having pain in left hip and leg and would like to make Dr. Tamala Julian aware

## 2020-11-05 NOTE — Telephone Encounter (Signed)
Pt is stating that he is supposed to have a AORTA/IVC/ILIACS done each year and has yet to be contacted in regards to having one done this year... please contact pt and advise intentions on this procedure.

## 2020-11-05 NOTE — Telephone Encounter (Signed)
Returned call to patient who states he is supposed to see Dr. Fletcher Anon yearly and it is time to schedule his appointment. I scheduled him for 11/29 with Dr. Fletcher Anon and advised that per his last ov with Dr. Fletcher Anon in Oct 21, no further orders were placed. Patient verbalized understanding and agreement and thanked me for the call.

## 2020-11-05 NOTE — Telephone Encounter (Signed)
Returned call to patient who states he is having some leg and hip pain and is uncertain of the cause. I advised him to notify his PCP regarding this pain and he states he has an appointment with APP at Dr. Delene Ruffini office next week. Also discussed patient's yearly follow-up with Dr. Fletcher Anon for PAD in separate encounter dated 11/05/20. Patient thanked me for the call.

## 2020-11-11 ENCOUNTER — Other Ambulatory Visit: Payer: Self-pay | Admitting: Physician Assistant

## 2020-11-11 ENCOUNTER — Ambulatory Visit
Admission: RE | Admit: 2020-11-11 | Discharge: 2020-11-11 | Disposition: A | Discharge status: Home / Self Care | Payer: Medicare PPO | Source: Ambulatory Visit | Attending: Physician Assistant | Admitting: Physician Assistant

## 2020-11-11 DIAGNOSIS — J189 Pneumonia, unspecified organism: Secondary | ICD-10-CM | POA: Diagnosis not present

## 2020-11-11 DIAGNOSIS — Z951 Presence of aortocoronary bypass graft: Secondary | ICD-10-CM | POA: Diagnosis not present

## 2020-11-11 DIAGNOSIS — J181 Lobar pneumonia, unspecified organism: Secondary | ICD-10-CM | POA: Diagnosis not present

## 2020-11-11 DIAGNOSIS — R918 Other nonspecific abnormal finding of lung field: Secondary | ICD-10-CM | POA: Diagnosis not present

## 2020-11-20 DIAGNOSIS — R0982 Postnasal drip: Secondary | ICD-10-CM | POA: Diagnosis not present

## 2020-11-20 DIAGNOSIS — Z8701 Personal history of pneumonia (recurrent): Secondary | ICD-10-CM | POA: Diagnosis not present

## 2020-11-20 DIAGNOSIS — R051 Acute cough: Secondary | ICD-10-CM | POA: Diagnosis not present

## 2020-11-20 DIAGNOSIS — J3489 Other specified disorders of nose and nasal sinuses: Secondary | ICD-10-CM | POA: Diagnosis not present

## 2020-11-20 DIAGNOSIS — Z03818 Encounter for observation for suspected exposure to other biological agents ruled out: Secondary | ICD-10-CM | POA: Diagnosis not present

## 2020-11-29 ENCOUNTER — Other Ambulatory Visit: Payer: Self-pay

## 2020-11-29 ENCOUNTER — Emergency Department (HOSPITAL_COMMUNITY)
Admission: EM | Admit: 2020-11-29 | Discharge: 2020-11-29 | Disposition: A | Discharge status: Home / Self Care | Payer: Medicare PPO | Attending: Emergency Medicine | Admitting: Emergency Medicine

## 2020-11-29 ENCOUNTER — Emergency Department (HOSPITAL_COMMUNITY): Payer: Medicare PPO

## 2020-11-29 DIAGNOSIS — Z20822 Contact with and (suspected) exposure to covid-19: Secondary | ICD-10-CM | POA: Diagnosis not present

## 2020-11-29 DIAGNOSIS — Z87891 Personal history of nicotine dependence: Secondary | ICD-10-CM | POA: Diagnosis not present

## 2020-11-29 DIAGNOSIS — I5042 Chronic combined systolic (congestive) and diastolic (congestive) heart failure: Secondary | ICD-10-CM | POA: Diagnosis not present

## 2020-11-29 DIAGNOSIS — I110 Hypertensive heart disease with heart failure: Secondary | ICD-10-CM | POA: Diagnosis not present

## 2020-11-29 DIAGNOSIS — R079 Chest pain, unspecified: Secondary | ICD-10-CM | POA: Diagnosis not present

## 2020-11-29 DIAGNOSIS — E119 Type 2 diabetes mellitus without complications: Secondary | ICD-10-CM | POA: Diagnosis not present

## 2020-11-29 DIAGNOSIS — Z951 Presence of aortocoronary bypass graft: Secondary | ICD-10-CM | POA: Diagnosis not present

## 2020-11-29 DIAGNOSIS — R0600 Dyspnea, unspecified: Secondary | ICD-10-CM | POA: Diagnosis not present

## 2020-11-29 DIAGNOSIS — R0602 Shortness of breath: Secondary | ICD-10-CM | POA: Insufficient documentation

## 2020-11-29 DIAGNOSIS — I2510 Atherosclerotic heart disease of native coronary artery without angina pectoris: Secondary | ICD-10-CM | POA: Insufficient documentation

## 2020-11-29 LAB — CBC WITH DIFFERENTIAL/PLATELET
Abs Immature Granulocytes: 0.05 10*3/uL (ref 0.00–0.07)
Basophils Absolute: 0 10*3/uL (ref 0.0–0.1)
Basophils Relative: 0 %
Eosinophils Absolute: 0.1 10*3/uL (ref 0.0–0.5)
Eosinophils Relative: 1 %
HCT: 35.9 % — ABNORMAL LOW (ref 39.0–52.0)
Hemoglobin: 11.9 g/dL — ABNORMAL LOW (ref 13.0–17.0)
Immature Granulocytes: 1 %
Lymphocytes Relative: 17 %
Lymphs Abs: 1.7 10*3/uL (ref 0.7–4.0)
MCH: 30.9 pg (ref 26.0–34.0)
MCHC: 33.1 g/dL (ref 30.0–36.0)
MCV: 93.2 fL (ref 80.0–100.0)
Monocytes Absolute: 0.8 10*3/uL (ref 0.1–1.0)
Monocytes Relative: 8 %
Neutro Abs: 7.5 10*3/uL (ref 1.7–7.7)
Neutrophils Relative %: 73 %
Platelets: 158 10*3/uL (ref 150–400)
RBC: 3.85 MIL/uL — ABNORMAL LOW (ref 4.22–5.81)
RDW: 13.2 % (ref 11.5–15.5)
WBC: 10.2 10*3/uL (ref 4.0–10.5)
nRBC: 0 % (ref 0.0–0.2)

## 2020-11-29 LAB — RESP PANEL BY RT-PCR (FLU A&B, COVID) ARPGX2
Influenza A by PCR: NEGATIVE
Influenza B by PCR: NEGATIVE
SARS Coronavirus 2 by RT PCR: NEGATIVE

## 2020-11-29 LAB — TROPONIN I (HIGH SENSITIVITY)
Troponin I (High Sensitivity): 14 ng/L (ref <18)
Troponin I (High Sensitivity): 25 ng/L — ABNORMAL HIGH (ref <18)

## 2020-11-29 LAB — BASIC METABOLIC PANEL
Anion gap: 10 (ref 5–15)
BUN: 15 mg/dL (ref 8–23)
CO2: 21 mmol/L — ABNORMAL LOW (ref 22–32)
Calcium: 9.1 mg/dL (ref 8.9–10.3)
Chloride: 99 mmol/L (ref 98–111)
Creatinine, Ser: 1.18 mg/dL (ref 0.61–1.24)
GFR, Estimated: >60 mL/min (ref >60)
Glucose, Bld: 168 mg/dL — ABNORMAL HIGH (ref 70–99)
Potassium: 4.1 mmol/L (ref 3.5–5.1)
Sodium: 130 mmol/L — ABNORMAL LOW (ref 135–145)

## 2020-11-29 LAB — BRAIN NATRIURETIC PEPTIDE: B Natriuretic Peptide: 1510.4 pg/mL — ABNORMAL HIGH (ref 0.0–100.0)

## 2020-11-29 MED ORDER — FUROSEMIDE 10 MG/ML IJ SOLN
40.0000 mg | Freq: Once | INTRAMUSCULAR | Status: AC
  Administered 2020-11-29: 05:00:00 40 mg via INTRAVENOUS
  Filled 2020-11-29: qty 4

## 2020-11-29 NOTE — ED Triage Notes (Signed)
Pt brought in by EMS for chest pain and shortness of breath.

## 2020-11-29 NOTE — ED Provider Notes (Signed)
Emergency Medicine Provider Triage Evaluation Note  Kailyn Dubie , a 70 y.o. male  was evaluated in triage.  Pt complains of chest pain and SOB.  Recent PNA last month.  Hx of CHF and feel like fluid in his chest again.  Denies LE edema.  He is on lasix.  92% on RA, currently on 3L with sats 98%.  Not generally O2 dependent.  Followed by cardiology, Dr. Tamala Julian Review of Systems  Positive: Chest pain, SOB Negative: fever  Physical Exam  BP (!) 153/84 (BP Location: Right Arm)   Pulse 92   Temp 100 F (37.8 C) (Oral)   Resp 18   SpO2 97%   Gen:   Awake, no distress   Resp:  Normal effort  MSK:   Moves extremities without difficulty  Other:  No peripheral edema  Medical Decision Making  Medically screening exam initiated at 2:28 AM.  Appropriate orders placed.  Boleslaw Koble was informed that the remainder of the evaluation will be completed by another provider, this initial triage assessment does not replace that evaluation, and the importance of remaining in the ED until their evaluation is complete.  CP and SOB.  Recent PNA.  Also has hx of CHF and feel like that again.  No LE edema noted.  Currently on 3L but not generally O2 dependent.  EKG, labs, CXR.   Larene Pickett, PA-C 11/29/20 0236    Maudie Flakes, MD 11/29/20 [phone]

## 2020-11-29 NOTE — Discharge Instructions (Signed)
You were evaluated in the Emergency Department and after careful evaluation, we did not find any emergent condition requiring admission or further testing in the hospital.  Your exam/testing today was overall reassuring.  Symptoms seem to be due to a bit of fluid on the lungs due to your heart.  Please follow-up with a cardiologist and discussed your Lasix dosing.  Please return to the Emergency Department if you experience any worsening of your condition.  Thank you for allowing [place] to be a part of your care.

## 2020-11-29 NOTE — ED Provider Notes (Signed)
[facility] Emergency Department Provider Note MRN:  [id number]  Arrival date & time: 11/29/20     Chief Complaint   Shortness of breath History of Present Illness   [name] [name] is a 70 y.o. year-old male with a history of CAD, presenting to the ED with chief complaint of shortness of breath.  Shortness of breath especially when laying down or trying to walk around and exert himself for the past few days.  Denies any chest pain.  Denies fever or cough, no abdominal pain, no other complaints.  Taking medications like normal.  Symptoms are moderate, constant, no other exacerbating or alleviating factors.  Review of Systems  A complete 10 system review of systems was obtained and all systems are negative except as noted in the HPI and PMH.   Patient's Health History    Past Medical History:  Diagnosis Date   Chest pain    a. experience weekly, nitrate responsive c/p x several years.   b. ranexa added on 08/2014 admission    Chronic combined systolic and diastolic CHF (congestive heart failure) ([place])    Claudication ([place])    a. 08/2014 ABI: R 0.79, L 1.0-->Walking program instituted.   Coronary artery disease    a. s/p MI in 1989;  b. 04/2008 CABG x 5 (LIMA->LAD, VG->D1, VG->RI, VG->PDA, L Radial->OM2);  c. 06/2010 PCI/DES to the Diag (2.25x28 Promus Element DES). VG->Diag occluded;  d. 08/2010 Attempted PTCA of the OM2 via the free radial graft. Unsuccessful.  Lesion felt to be 2/2 suture.  c. NSTEMI s/p [facility] with severe native and graph dz; D1 that was stented in 2012 now occulded: Rx therapy recommended   Coronary artery disease involving native coronary artery of native heart with angina pectoris ([place])    a. s/p MI in 1989;  b. 04/2008 CABG x 5 (LIMA->LAD, VG->D1, VG->RI, VG->PDA, L Radial->OM2);  c. 06/2010 PCI/DES to the Diag (2.25x28 Promus Element DES). VG->Diag occluded;  d. 08/2010 Attempted PTCA of the OM2 via the free radial graft. Unsuccessful.  Lesion felt to  be 2/2 suture.  c. NSTEMI s/p [facility] with severe native and graph dz; Rx therapy recommended    Diabetes mellitus ([place]) 06/02/2011   Hyperlipidemia    Hypertension    NSTEMI (non-ST elevated myocardial infarction) ([place]) 08/29/2014   Peripheral arterial disease ([place]) 08/16/2014   Type II diabetes mellitus ([place])     Past Surgical History:  Procedure Laterality Date   CARDIAC CATHETERIZATION N/A 08/28/2014   Procedure: Left Heart Cath and Cors/Grafts Angiography;  Surgeon: Belva Crome, MD;  Location: [place] [facility];  Service: Cardiovascular;  Laterality: N/A;   COLONOSCOPY     CORONARY ANGIOPLASTY WITH STENT PLACEMENT     CORONARY ARTERY BYPASS GRAFT     LEFT HEART CATH AND CORS/GRAFTS ANGIOGRAPHY N/A 03/20/2018   Procedure: LEFT HEART CATH AND CORS/GRAFTS ANGIOGRAPHY;  Surgeon: Martinique, Peter M, MD;  Location: [place] [facility];  Service: Cardiovascular;  Laterality: N/A;   PERIPHERAL VASCULAR CATHETERIZATION N/A 07/15/2015   Procedure: Abdominal Aortogram w/Lower Extremity;  Surgeon: Wellington Hampshire, MD;  Location: [place] [facility];  Service: Cardiovascular;  Laterality: N/A;   PERIPHERAL VASCULAR CATHETERIZATION Bilateral 07/15/2015   Procedure: Peripheral Vascular Intervention;  Surgeon: Wellington Hampshire, MD;  Location: [place] [facility];  Service: Cardiovascular;  Laterality: Bilateral;  Common Iliacs   SHOULDER ARTHROSCOPY WITH ROTATOR CUFF REPAIR AND SUBACROMIAL DECOMPRESSION Left 05/23/2013   Procedure: LEFT SHOULDER ARTHROSCOPY SUBACROMIAL  DECOMPRESSION DISTAL CLAVICLE RESECTION AND ROTATOR CUFF REPAIR ;  Surgeon: Marin Shutter, MD;  Location: [place];  Service: Orthopedics;  Laterality: Left;   SHOULDER SURGERY Right 7-8 years ago   Dr. Dorna Leitz    Family History  Problem Relation Age of Onset   Heart Problems Brother        heart valve replacement   Anesthesia problems Brother    Heart disease Mother    Lung cancer Mother    Heart failure Mother    Heart attack Father     Heart Problems Sister        had heart valve replacement   Hypertension Brother    Diabetes Brother    Kidney failure Brother    Heart Problems Brother     Social History   Socioeconomic History   Marital status: Married    Spouse name: Not on file   Number of children: Not on file   Years of education: Not on file   Highest education level: Not on file  Occupational History   Not on file  Tobacco Use   Smoking status: Former    Types: Cigarettes    Quit date: 01/04/1987    Years since quitting: 33.9   Smokeless tobacco: Former  Scientific laboratory technician Use: Never used  Substance and Sexual Activity   Alcohol use: Yes    Alcohol/week: 2.0 standard drinks    Types: 2 Cans of beer per week    Comment: a week   Drug use: No   Sexual activity: Not on file  Other Topics Concern   Not on file  Social History Narrative   Lives in [place] with his wife.  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   Social Determinants of Health   Financial Resource Strain: Not on file  Food Insecurity: Not on file  Transportation Needs: Not on file  Physical Activity: Not on file  Stress: Not on file  Social Connections: Not on file  Intimate Partner Violence: Not on file     Physical Exam   Vitals:   11/29/20 0530 11/29/20 0545  BP: 102/90 (!) 145/66  Pulse: 71 70  Resp: 16 (!) 21  Temp:    SpO2: 95% 95%    CONSTITUTIONAL: Well-appearing, NAD NEURO:  Alert and oriented x 3, no focal deficits EYES:  eyes equal and reactive ENT/NECK:  no LAD, no JVD CARDIO: Regular rate, well-perfused, normal S1 and S2 PULM:  CTAB no wheezing or rhonchi GI/GU:  normal bowel sounds, non-distended, non-tender MSK/SPINE:  No gross deformities, no edema SKIN:  no rash, atraumatic PSYCH:  Appropriate speech and behavior  *Additional and/or pertinent findings included in MDM below  Diagnostic and Interventional Summary    EKG Interpretation  Date/Time:  Sunday November 29 2020 02:22:08 EST Ventricular  Rate:  93 PR Interval:  218 QRS Duration: 102 QT Interval:  366 QTC Calculation: 455 R Axis:   18 Text Interpretation: Sinus rhythm with 1st degree A-V block ST & T wave abnormality, consider lateral ischemia Abnormal ECG No significant change was found Confirmed by Gerlene Fee [id number]) on 11/29/2020 6:12:10 AM       Labs Reviewed  CBC WITH DIFFERENTIAL/PLATELET - Abnormal; Notable for the following components:      Result Value   RBC 3.85 (*)    Hemoglobin 11.9 (*)    HCT 35.9 (*)    All other components within normal limits  BASIC METABOLIC PANEL - Abnormal; Notable  for the following components:   Sodium 130 (*)    CO2 21 (*)    Glucose, Bld 168 (*)    All other components within normal limits  BRAIN NATRIURETIC PEPTIDE - Abnormal; Notable for the following components:   B Natriuretic Peptide 1,510.4 (*)    All other components within normal limits  TROPONIN I (HIGH SENSITIVITY) - Abnormal; Notable for the following components:   Troponin I (High Sensitivity) 25 (*)    All other components within normal limits  RESP PANEL BY RT-PCR (FLU A&B, COVID) ARPGX2  TROPONIN I (HIGH SENSITIVITY)    DG Chest 2 View  Final Result      Medications  furosemide (LASIX) injection 40 mg (40 mg Intravenous Given 11/29/20 0433)     Procedures  /  Critical Care Procedures  ED Course and Medical Decision Making  I have reviewed the triage vital signs, the nursing notes, and pertinent available records from the EMR.  Listed above are laboratory and imaging tests that I personally ordered, reviewed, and interpreted and then considered in my medical decision making (see below for details).  Suspect CHF exacerbation, supported by BNP.  Providing Lasix and will reassess.  Also considering recurrence of pneumonia.  Currently without tachycardia or any signs of increased work of breathing, no leukocytosis.     Patient continues to look and feel well with normal vital signs, good response to  Lasix, no hypoxia, appropriate for discharge.  Barth Kirks. Sedonia Small, MD [place] [email or web address]  Final Clinical Impressions(s) / ED Diagnoses     ICD-10-CM   1. Shortness of breath  R06.02       ED Discharge Orders     None        Discharge Instructions Discussed with and Provided to Patient:     Discharge Instructions      You were evaluated in the Emergency Department and after careful evaluation, we did not find any emergent condition requiring admission or further testing in the hospital.  Your exam/testing today was overall reassuring.  Symptoms seem to be due to a bit of fluid on the lungs due to your heart.  Please follow-up with a cardiologist and discussed your Lasix dosing.  Please return to the Emergency Department if you experience any worsening of your condition.  Thank you for allowing [place] to be a part of your care.         Maudie Flakes, MD 11/29/20 [phone]

## 2020-11-30 ENCOUNTER — Encounter: Payer: Self-pay | Admitting: Interventional Cardiology

## 2020-11-30 DIAGNOSIS — I5042 Chronic combined systolic (congestive) and diastolic (congestive) heart failure: Secondary | ICD-10-CM

## 2020-12-01 ENCOUNTER — Other Ambulatory Visit: Payer: Self-pay

## 2020-12-01 ENCOUNTER — Ambulatory Visit: Payer: Medicare PPO | Admitting: Cardiovascular Disease

## 2020-12-01 ENCOUNTER — Encounter: Payer: Self-pay | Admitting: Cardiovascular Disease

## 2020-12-01 VITALS — BP 148/72 | HR 70 | Ht 66.5 in | Wt 158.0 lb

## 2020-12-01 DIAGNOSIS — R0602 Shortness of breath: Secondary | ICD-10-CM

## 2020-12-01 DIAGNOSIS — E782 Mixed hyperlipidemia: Secondary | ICD-10-CM | POA: Diagnosis not present

## 2020-12-01 DIAGNOSIS — I5040 Unspecified combined systolic (congestive) and diastolic (congestive) heart failure: Secondary | ICD-10-CM | POA: Diagnosis not present

## 2020-12-01 DIAGNOSIS — I25118 Atherosclerotic heart disease of native coronary artery with other forms of angina pectoris: Secondary | ICD-10-CM

## 2020-12-01 DIAGNOSIS — I739 Peripheral vascular disease, unspecified: Secondary | ICD-10-CM | POA: Diagnosis not present

## 2020-12-01 DIAGNOSIS — E7849 Other hyperlipidemia: Secondary | ICD-10-CM

## 2020-12-01 DIAGNOSIS — E1151 Type 2 diabetes mellitus with diabetic peripheral angiopathy without gangrene: Secondary | ICD-10-CM | POA: Diagnosis not present

## 2020-12-01 DIAGNOSIS — I5022 Chronic systolic (congestive) heart failure: Secondary | ICD-10-CM

## 2020-12-01 DIAGNOSIS — N400 Enlarged prostate without lower urinary tract symptoms: Secondary | ICD-10-CM | POA: Diagnosis not present

## 2020-12-01 DIAGNOSIS — I10 Essential (primary) hypertension: Secondary | ICD-10-CM | POA: Diagnosis not present

## 2020-12-01 MED ORDER — ISOSORBIDE MONONITRATE ER 60 MG PO TB24: 60.0000 mg | ORAL_TABLET | Freq: Every day | ORAL | 3 refills | Status: DC

## 2020-12-01 NOTE — Patient Instructions (Signed)
Medication Instructions:  INCREASE isosorbide (Imdur) to 60 mg daily  *If you need a refill on your cardiac medications before your next appointment, please call your pharmacy*  Testing/Procedures: Your physician has requested that you have an echocardiogram. Echocardiography is a painless test that uses sound waves to create images of your heart. It provides your doctor with information about the size and shape of your heart and how well your heart's chambers and valves are working. This procedure takes approximately one hour. There are no restrictions for this procedure.  Your physician has requested that you have an ankle brachial index (ABI). During this test an ultrasound and blood pressure cuff are used to evaluate the arteries that supply the arms and legs with blood. Allow thirty minutes for this exam. There are no restrictions or special instructions.  Your physician has requested that you have an aorto-iliac duplex. During this test, an ultrasound is used to evaluate the aorta and iliac arteries. Do not eat after midnight the day before and avoid carbonated beverages  Follow-Up: At [facility], you and your health needs are our priority.  As part of our continuing mission to provide you with exceptional heart care, we have created designated Provider Care Teams.  These Care Teams include your primary Cardiologist (physician) and Advanced Practice Providers (APPs -  Physician Assistants and Nurse Practitioners) who all work together to provide you with the care you need, when you need it.  We recommend signing up for the patient portal called "MyChart".  Sign up information is provided on this After Visit Summary.  MyChart is used to connect with patients for Virtual Visits (Telemedicine).  Patients are able to view lab/test results, encounter notes, upcoming appointments, etc.  Non-urgent messages can be sent to your provider as well.   To learn more about what you can do with MyChart,  go to [email or web address].    Your next appointment:   12 month(s)  The format for your next appointment:   In Person  Provider:   Dr. Fletcher Anon

## 2020-12-01 NOTE — Progress Notes (Signed)
Cardiology Office Note   Date:  12/01/2020   ID:  [name] [name], DOB 1950-01-31, MRN [id number]  PCP:  [name] Orn, MD  Cardiologist: Dr. Tamala Julian  Chief Complaint  Patient presents with   Follow-up    1 year.   Chest Pain   Headache   Shortness of Breath   Edema      History of Present Illness: [name] [name] is a 70 y.o. male who presents for a follow-up visit regarding peripheral arterial disease. He has known history of coronary artery disease status post CABG, chronic systolic heart failure, hypertension, type 2 diabetes and hyperlipidemia.   He is status post bilateral common iliac artery kissing stent placement in July 2017 for severe claudication.  He is known to have heavily calcified occluded right popliteal artery with collaterals.   The patient had worsening LV systolic function in 0160.  Cardiac catheterization showed patent grafts.  He was switched to [facility] with subsequent improvement.   Most recent echocardiogram in November of 2020 showed normalization of LV systolic function with an EF of 60 to 65%.  He went to the emergency room recently after Thanksgiving with increased shortness of breath and elevated BNP to 1500.  He was given 1 dose of IV furosemide with improvement.  He does admit to increased sodium intake around Thanksgiving.  He feels better now but he does report that he has been having more episodes of chest pain requiring nitroglycerin.  In addition, he reports left ankle discomfort and pain at rest and with walking.  He has minimal right calf claudication.   Past Medical History:  Diagnosis Date   Chest pain    a. experience weekly, nitrate responsive c/p x several years.   b. ranexa added on 08/2014 admission    Chronic combined systolic and diastolic CHF (congestive heart failure) ([place])    Claudication ([place])    a. 08/2014 ABI: R 0.79, L 1.0-->Walking program instituted.   Coronary artery disease    a. s/p MI in 1989;  b. 04/2008 CABG x 5  (LIMA->LAD, VG->D1, VG->RI, VG->PDA, L Radial->OM2);  c. 06/2010 PCI/DES to the Diag (2.25x28 Promus Element DES). VG->Diag occluded;  d. 08/2010 Attempted PTCA of the OM2 via the free radial graft. Unsuccessful.  Lesion felt to be 2/2 suture.  c. NSTEMI s/p [facility] with severe native and graph dz; D1 that was stented in 2012 now occulded: Rx therapy recommended   Coronary artery disease involving native coronary artery of native heart with angina pectoris ([place])    a. s/p MI in 1989;  b. 04/2008 CABG x 5 (LIMA->LAD, VG->D1, VG->RI, VG->PDA, L Radial->OM2);  c. 06/2010 PCI/DES to the Diag (2.25x28 Promus Element DES). VG->Diag occluded;  d. 08/2010 Attempted PTCA of the OM2 via the free radial graft. Unsuccessful.  Lesion felt to be 2/2 suture.  c. NSTEMI s/p [facility] with severe native and graph dz; Rx therapy recommended    Diabetes mellitus ([place]) 06/02/2011   Hyperlipidemia    Hypertension    NSTEMI (non-ST elevated myocardial infarction) ([place]) 08/29/2014   Peripheral arterial disease ([place]) 08/16/2014   Type II diabetes mellitus ([place])     Past Surgical History:  Procedure Laterality Date   CARDIAC CATHETERIZATION N/A 08/28/2014   Procedure: Left Heart Cath and Cors/Grafts Angiography;  Surgeon: Belva Crome, MD;  Location: [place] [facility];  Service: Cardiovascular;  Laterality: N/A;   COLONOSCOPY     CORONARY ANGIOPLASTY WITH STENT PLACEMENT     CORONARY ARTERY  BYPASS GRAFT     LEFT HEART CATH AND CORS/GRAFTS ANGIOGRAPHY N/A 03/20/2018   Procedure: LEFT HEART CATH AND CORS/GRAFTS ANGIOGRAPHY;  Surgeon: Martinique, Peter M, MD;  Location: [place] [facility];  Service: Cardiovascular;  Laterality: N/A;   PERIPHERAL VASCULAR CATHETERIZATION N/A 07/15/2015   Procedure: Abdominal Aortogram w/Lower Extremity;  Surgeon: Wellington Hampshire, MD;  Location: [place] [facility];  Service: Cardiovascular;  Laterality: N/A;   PERIPHERAL VASCULAR CATHETERIZATION Bilateral 07/15/2015   Procedure: Peripheral Vascular Intervention;   Surgeon: Wellington Hampshire, MD;  Location: [place] [facility];  Service: Cardiovascular;  Laterality: Bilateral;  Common Iliacs   SHOULDER ARTHROSCOPY WITH ROTATOR CUFF REPAIR AND SUBACROMIAL DECOMPRESSION Left 05/23/2013   Procedure: LEFT SHOULDER ARTHROSCOPY SUBACROMIAL  DECOMPRESSION DISTAL CLAVICLE RESECTION AND ROTATOR CUFF REPAIR ;  Surgeon: Marin Shutter, MD;  Location: [place];  Service: Orthopedics;  Laterality: Left;   SHOULDER SURGERY Right 7-8 years ago   Dr. Dorna Leitz     Current Outpatient Medications  Medication Sig Dispense Refill   acetaminophen (TYLENOL) 500 MG tablet Take 500 mg by mouth daily as needed for mild pain.     albuterol (VENTOLIN HFA) 108 (90 Base) MCG/ACT inhaler Inhale 2 puffs into the lungs every 4 (four) hours as needed.     ALPRAZolam (XANAX) 0.25 MG tablet Take 0.25 mg by mouth 3 (three) times daily as needed for anxiety (nerves).     aspirin 81 MG tablet Take 81 mg by mouth daily.      atorvastatin (LIPITOR) 80 MG tablet Take 1 tablet by mouth once daily (Patient taking differently: Take 80 mg by mouth every evening.) 90 tablet 3   Azelastine HCl 0.15 % SOLN Place 1 spray into both nostrils daily.     carvedilol (COREG) 6.25 MG tablet TAKE 1 TABLET TWICE DAILY WITH A MEAL (Patient taking differently: Take 6.25 mg by mouth 2 (two) times daily with a meal.) 180 tablet 3   cholecalciferol (VITAMIN D) 1000 UNITS tablet Take 1,000 Units by mouth daily.      clopidogrel (PLAVIX) 75 MG tablet Take 75 mg by mouth daily with breakfast.     ezetimibe (ZETIA) 10 MG tablet TAKE 1 TABLET EVERY DAY (Patient taking differently: Take 10 mg by mouth daily.) 90 tablet 3   fluticasone (FLONASE) 50 MCG/ACT nasal spray Place 2 sprays into the nose daily. For nasal congestion     furosemide (LASIX) 40 MG tablet Take 1 tablet by mouth once daily (Patient taking differently: Take 40 mg by mouth daily.) 90 tablet 3   isosorbide mononitrate (IMDUR) 30 MG 24 hr tablet Take 1 tablet  by mouth once daily (Patient taking differently: 30 mg daily.) 90 tablet 1   metFORMIN (GLUCOPHAGE) 1000 MG tablet Take 1,000 mg by mouth 2 (two) times daily with a meal.     nitroGLYCERIN (NITROSTAT) 0.4 MG SL tablet Place 0.4 mg under the tongue every 5 (five) minutes x 3 doses as needed. For chest pain     Polyvinyl Alcohol-Povidone (CLEAR EYES ALL SEASONS OP) Place 1 drop into both eyes daily as needed (for dry eyes).     ranolazine (RANEXA) 500 MG 12 hr tablet TAKE 1 TABLET TWICE DAILY (Patient taking differently: 500 mg 2 (two) times daily.) 180 tablet 3   sacubitril-valsartan (ENTRESTO) 97-103 MG Take 1 tablet by mouth 2 (two) times daily. 60 tablet 11   No current facility-administered medications for this visit.    Allergies:   Aldactone [  spironolactone], Farxiga [dapagliflozin], Percocet [oxycodone-acetaminophen], Trulicity [dulaglutide], Avandia [rosiglitazone], and Codeine    Social History:  The patient  reports that he quit smoking about 33 years ago. He has quit using smokeless tobacco. He reports current alcohol use of about 2.0 standard drinks per week. He reports that he does not use drugs.   Family History:  The patient's family history includes Anesthesia problems in his brother; Diabetes in his brother; Heart Problems in his brother, brother, and sister; Heart attack in his father; Heart disease in his mother; Heart failure in his mother; Hypertension in his brother; Kidney failure in his brother; Lung cancer in his mother.    ROS:  Please see the history of present illness.   Otherwise, review of systems are positive for none.   All other systems are reviewed and negative.    PHYSICAL EXAM: VS:  BP (!) 148/72 (BP Location: Right Arm, Patient Position: Sitting, Cuff Size: Normal)   Pulse 70   Ht 5' 6.5" (1.689 m)   Wt 158 lb (71.7 kg)   BMI 25.12 kg/m  , BMI Body mass index is 25.12 kg/m. GEN: Well nourished, well developed, in no acute distress  HEENT: normal   Neck: no JVD,  or masses. Faint right carotid bruit Cardiac: RRR; no  rubs, or gallops, 2 out of 6 systolic murmur in the aortic area.  Trace bilateral leg edema Respiratory:  clear to auscultation bilaterally, normal work of breathing GI: soft, nontender, nondistended, + BS MS: no deformity or atrophy  Skin: warm and dry, no rash Neuro:  Strength and sensation are intact Psych: euthymic mood, full affect Vascular: Femoral pulse: +2 bilaterally.  Distal pulses are absent on the right but +2 on the left.   EKG:  EKG is not  ordered today.   Recent Labs: 11/29/2020: B Natriuretic Peptide 1,510.4; BUN 15; Creatinine, Ser 1.18; Hemoglobin 11.9; Platelets 158; Potassium 4.1; Sodium 130    Lipid Panel    Component Value Date/Time   CHOL 114 03/20/2018 1001   TRIG 58 03/20/2018 1001   HDL 47 03/20/2018 1001   CHOLHDL 2.4 03/20/2018 1001   VLDL 12 03/20/2018 1001   LDLCALC 55 03/20/2018 1001      Wt Readings from Last 3 Encounters:  12/01/20 158 lb (71.7 kg)  11/29/20 156 lb (70.8 kg)  08/12/20 162 lb (73.5 kg)        ASSESSMENT AND PLAN:  1.  Peripheral arterial disease : Status post bilateral common iliac artery stent placement .  He has occluded right popliteal artery but currently with no right calf claudication.  He reports left leg pain that does not seem to be vascular in etiology as his pulses are normal on the left.  He is due for a follow-up ABI and aortoiliac duplex which were requested today.  2. Coronary artery disease with other forms of angina: He reports worsening angina over the last few months.  He was on amlodipine but was discontinued due to leg edema.  I increase his Imdur to 60 mg daily and will have him follow-up with Dr. Tamala Julian.  3.  Chronic systolic heart failure: The patient had recent heart failure likely due to increased sodium intake around Thanksgiving.  He appears to be euvolemic now on this I favor that he continue same dose of furosemide.  He did  not tolerate Farxiga due to yeast infections.  Given worsening heart failure symptoms and angina, I requested a follow-up echocardiogram and will have him follow-up  with Dr. Tamala Julian to see if further investigation is needed.  4. Essential hypertension: Blood pressure is mildly elevated.  I increased Imdur to 60 mg daily.  5. Right carotid bruit: Most recent carotid Doppler in 2019 showed mild less than 40% disease bilaterally.  6.  Hyperlipidemia: Continue treatment with high-dose atorvastatin and Zetia.  Recommend a target LDL of less than 70.   Disposition:   Proceed with an echocardiogram and follow-up with Dr. Tamala Julian as planned.  FU with me in 1 year.   Signed,  Kathlyn Sacramento, MD  12/01/2020 8:26 AM    [facility]

## 2020-12-02 MED ORDER — EPLERENONE 25 MG PO TABS: 25.0000 mg | ORAL_TABLET | Freq: Every day | ORAL | 2 refills | Status: DC

## 2020-12-02 NOTE — Telephone Encounter (Signed)
Spoke with pt and reviewed recommendations per Dr. Tamala Julian.  He will come for labs on 12/9.  Pt verbalized understanding and was appreciative for call.

## 2020-12-03 ENCOUNTER — Other Ambulatory Visit: Payer: Self-pay | Admitting: Interventional Cardiology

## 2020-12-09 DIAGNOSIS — Z1211 Encounter for screening for malignant neoplasm of colon: Secondary | ICD-10-CM | POA: Diagnosis not present

## 2020-12-11 ENCOUNTER — Other Ambulatory Visit: Payer: Self-pay

## 2020-12-11 ENCOUNTER — Other Ambulatory Visit: Payer: Medicare PPO | Admitting: *Deleted

## 2020-12-11 DIAGNOSIS — I5042 Chronic combined systolic (congestive) and diastolic (congestive) heart failure: Secondary | ICD-10-CM

## 2020-12-11 LAB — BASIC METABOLIC PANEL
BUN/Creatinine Ratio: 13 (ref 10–24)
BUN: 16 mg/dL (ref 8–27)
CO2: 20 mmol/L (ref 20–29)
Calcium: 9.5 mg/dL (ref 8.6–10.2)
Chloride: 94 mmol/L — ABNORMAL LOW (ref 96–106)
Creatinine, Ser: 1.19 mg/dL (ref 0.76–1.27)
Glucose: 219 mg/dL — ABNORMAL HIGH (ref 70–99)
Potassium: 4.7 mmol/L (ref 3.5–5.2)
Sodium: 129 mmol/L — ABNORMAL LOW (ref 134–144)
eGFR: 65 mL/min/{1.73_m2} (ref >59)

## 2020-12-16 ENCOUNTER — Ambulatory Visit
Admission: RE | Admit: 2020-12-16 | Discharge: 2020-12-16 | Disposition: A | Discharge status: Home / Self Care | Payer: Medicare PPO | Source: Ambulatory Visit | Attending: Internal Medicine | Admitting: Internal Medicine

## 2020-12-16 ENCOUNTER — Other Ambulatory Visit: Payer: Self-pay | Admitting: Internal Medicine

## 2020-12-16 DIAGNOSIS — I25118 Atherosclerotic heart disease of native coronary artery with other forms of angina pectoris: Secondary | ICD-10-CM | POA: Diagnosis not present

## 2020-12-16 DIAGNOSIS — R0602 Shortness of breath: Secondary | ICD-10-CM | POA: Diagnosis not present

## 2020-12-16 DIAGNOSIS — I5040 Unspecified combined systolic (congestive) and diastolic (congestive) heart failure: Secondary | ICD-10-CM | POA: Diagnosis not present

## 2020-12-16 DIAGNOSIS — Z8701 Personal history of pneumonia (recurrent): Secondary | ICD-10-CM | POA: Diagnosis not present

## 2020-12-16 DIAGNOSIS — E782 Mixed hyperlipidemia: Secondary | ICD-10-CM | POA: Diagnosis not present

## 2020-12-16 DIAGNOSIS — I10 Essential (primary) hypertension: Secondary | ICD-10-CM | POA: Diagnosis not present

## 2020-12-16 DIAGNOSIS — I739 Peripheral vascular disease, unspecified: Secondary | ICD-10-CM | POA: Diagnosis not present

## 2020-12-16 DIAGNOSIS — I700 Atherosclerosis of aorta: Secondary | ICD-10-CM | POA: Diagnosis not present

## 2020-12-16 DIAGNOSIS — R079 Chest pain, unspecified: Secondary | ICD-10-CM | POA: Diagnosis not present

## 2020-12-16 DIAGNOSIS — Z1389 Encounter for screening for other disorder: Secondary | ICD-10-CM | POA: Diagnosis not present

## 2020-12-16 DIAGNOSIS — R059 Cough, unspecified: Secondary | ICD-10-CM | POA: Diagnosis not present

## 2020-12-16 DIAGNOSIS — R0609 Other forms of dyspnea: Secondary | ICD-10-CM

## 2020-12-16 DIAGNOSIS — E1151 Type 2 diabetes mellitus with diabetic peripheral angiopathy without gangrene: Secondary | ICD-10-CM | POA: Diagnosis not present

## 2020-12-16 DIAGNOSIS — Z0000 Encounter for general adult medical examination without abnormal findings: Secondary | ICD-10-CM | POA: Diagnosis not present

## 2020-12-23 ENCOUNTER — Ambulatory Visit (HOSPITAL_COMMUNITY): Payer: Medicare PPO | Attending: Cardiology

## 2020-12-23 ENCOUNTER — Other Ambulatory Visit: Payer: Self-pay

## 2020-12-23 DIAGNOSIS — R0602 Shortness of breath: Secondary | ICD-10-CM | POA: Diagnosis not present

## 2020-12-23 LAB — ECHOCARDIOGRAM COMPLETE
AR max vel: 1.09 cm2
AV Area VTI: 1.03 cm2
AV Area mean vel: 1.03 cm2
AV Mean grad: 13 mmHg
AV Peak grad: 21.9 mmHg
Ao pk vel: 2.34 m/s
Area-P 1/2: 3.03 cm2
MV M vel: 5.09 m/s
MV Peak grad: 103.6 mmHg
Radius: 0.4 cm
S' Lateral: 4.3 cm

## 2020-12-25 ENCOUNTER — Ambulatory Visit (HOSPITAL_COMMUNITY)
Admission: RE | Admit: 2020-12-25 | Discharge: 2020-12-25 | Disposition: A | Discharge status: Home / Self Care | Payer: Medicare PPO | Source: Ambulatory Visit | Attending: Cardiovascular Disease | Admitting: Cardiovascular Disease

## 2020-12-25 ENCOUNTER — Ambulatory Visit (HOSPITAL_BASED_OUTPATIENT_CLINIC_OR_DEPARTMENT_OTHER)
Admission: RE | Admit: 2020-12-25 | Discharge: 2020-12-25 | Disposition: A | Discharge status: Home / Self Care | Payer: Medicare PPO | Source: Ambulatory Visit | Attending: Cardiovascular Disease | Admitting: Cardiovascular Disease

## 2020-12-25 ENCOUNTER — Other Ambulatory Visit: Payer: Self-pay

## 2020-12-25 DIAGNOSIS — M79605 Pain in left leg: Secondary | ICD-10-CM

## 2020-12-25 DIAGNOSIS — I739 Peripheral vascular disease, unspecified: Secondary | ICD-10-CM

## 2020-12-25 DIAGNOSIS — J189 Pneumonia, unspecified organism: Secondary | ICD-10-CM | POA: Diagnosis not present

## 2020-12-30 DIAGNOSIS — I252 Old myocardial infarction: Secondary | ICD-10-CM | POA: Diagnosis not present

## 2020-12-30 DIAGNOSIS — I25118 Atherosclerotic heart disease of native coronary artery with other forms of angina pectoris: Secondary | ICD-10-CM | POA: Diagnosis not present

## 2020-12-30 DIAGNOSIS — E1151 Type 2 diabetes mellitus with diabetic peripheral angiopathy without gangrene: Secondary | ICD-10-CM | POA: Diagnosis not present

## 2020-12-30 DIAGNOSIS — I10 Essential (primary) hypertension: Secondary | ICD-10-CM | POA: Diagnosis not present

## 2020-12-30 DIAGNOSIS — I5040 Unspecified combined systolic (congestive) and diastolic (congestive) heart failure: Secondary | ICD-10-CM | POA: Diagnosis not present

## 2020-12-30 DIAGNOSIS — N400 Enlarged prostate without lower urinary tract symptoms: Secondary | ICD-10-CM | POA: Diagnosis not present

## 2020-12-30 DIAGNOSIS — E782 Mixed hyperlipidemia: Secondary | ICD-10-CM | POA: Diagnosis not present

## 2021-01-08 ENCOUNTER — Ambulatory Visit
Admission: RE | Admit: 2021-01-08 | Discharge: 2021-01-08 | Disposition: A | Discharge status: Home / Self Care | Payer: Medicare PPO | Source: Ambulatory Visit | Attending: Internal Medicine | Admitting: Internal Medicine

## 2021-01-08 ENCOUNTER — Other Ambulatory Visit: Payer: Self-pay | Admitting: Internal Medicine

## 2021-01-08 DIAGNOSIS — J189 Pneumonia, unspecified organism: Secondary | ICD-10-CM | POA: Diagnosis not present

## 2021-01-08 DIAGNOSIS — R918 Other nonspecific abnormal finding of lung field: Secondary | ICD-10-CM | POA: Diagnosis not present

## 2021-01-13 ENCOUNTER — Other Ambulatory Visit: Payer: Self-pay | Admitting: Internal Medicine

## 2021-01-13 DIAGNOSIS — R9389 Abnormal findings on diagnostic imaging of other specified body structures: Secondary | ICD-10-CM

## 2021-01-19 ENCOUNTER — Other Ambulatory Visit (HOSPITAL_BASED_OUTPATIENT_CLINIC_OR_DEPARTMENT_OTHER): Payer: Self-pay | Admitting: Cardiovascular Disease

## 2021-01-19 DIAGNOSIS — Z95828 Presence of other vascular implants and grafts: Secondary | ICD-10-CM

## 2021-01-19 DIAGNOSIS — I739 Peripheral vascular disease, unspecified: Secondary | ICD-10-CM

## 2021-02-02 DIAGNOSIS — I5040 Unspecified combined systolic (congestive) and diastolic (congestive) heart failure: Secondary | ICD-10-CM | POA: Diagnosis not present

## 2021-02-02 DIAGNOSIS — E1151 Type 2 diabetes mellitus with diabetic peripheral angiopathy without gangrene: Secondary | ICD-10-CM | POA: Diagnosis not present

## 2021-02-02 DIAGNOSIS — E782 Mixed hyperlipidemia: Secondary | ICD-10-CM | POA: Diagnosis not present

## 2021-02-02 DIAGNOSIS — I252 Old myocardial infarction: Secondary | ICD-10-CM | POA: Diagnosis not present

## 2021-02-02 DIAGNOSIS — N400 Enlarged prostate without lower urinary tract symptoms: Secondary | ICD-10-CM | POA: Diagnosis not present

## 2021-02-02 DIAGNOSIS — I25118 Atherosclerotic heart disease of native coronary artery with other forms of angina pectoris: Secondary | ICD-10-CM | POA: Diagnosis not present

## 2021-02-02 DIAGNOSIS — I10 Essential (primary) hypertension: Secondary | ICD-10-CM | POA: Diagnosis not present

## 2021-02-05 ENCOUNTER — Ambulatory Visit
Admission: RE | Admit: 2021-02-05 | Discharge: 2021-02-05 | Disposition: A | Discharge status: Home / Self Care | Payer: Medicare PPO | Source: Ambulatory Visit | Attending: Internal Medicine | Admitting: Internal Medicine

## 2021-02-05 DIAGNOSIS — I700 Atherosclerosis of aorta: Secondary | ICD-10-CM | POA: Diagnosis not present

## 2021-02-05 DIAGNOSIS — I898 Other specified noninfective disorders of lymphatic vessels and lymph nodes: Secondary | ICD-10-CM | POA: Diagnosis not present

## 2021-02-05 DIAGNOSIS — Z8701 Personal history of pneumonia (recurrent): Secondary | ICD-10-CM | POA: Diagnosis not present

## 2021-02-05 DIAGNOSIS — R9389 Abnormal findings on diagnostic imaging of other specified body structures: Secondary | ICD-10-CM

## 2021-02-05 DIAGNOSIS — J439 Emphysema, unspecified: Secondary | ICD-10-CM | POA: Diagnosis not present

## 2021-02-05 MED ORDER — IOPAMIDOL (ISOVUE-300) INJECTION 61%
75.0000 mL | Freq: Once | INTRAVENOUS | Status: AC | PRN
  Administered 2021-02-05: 75 mL via INTRAVENOUS

## 2021-02-09 IMAGING — CT CT ANGIOGRAPHY CHEST
2 of 7 series · 18 of 46 positions shown · IV contrast (APPLIED)
Comparison: CTA chest 05/22/2008

CLINICAL DATA: Chest pain and shortness of breath with wheezing

EXAM:
CT ANGIOGRAPHY CHEST WITH CONTRAST
TECHNIQUE: Multidetector CT imaging of the chest was performed using the
standard protocol during bolus administration of intravenous
contrast. Multiplanar CT image reconstructions and MIPs were
obtained to evaluate the vascular anatomy.
CONTRAST:  80mL OMNIPAQUE IOHEXOL 350 MG/ML SOLN

[Series 7: thins · axial · 0.73mm/px · z∈[+1087,+1379]mm · 15 of 471 slices shown]
[im 27/471  lung]
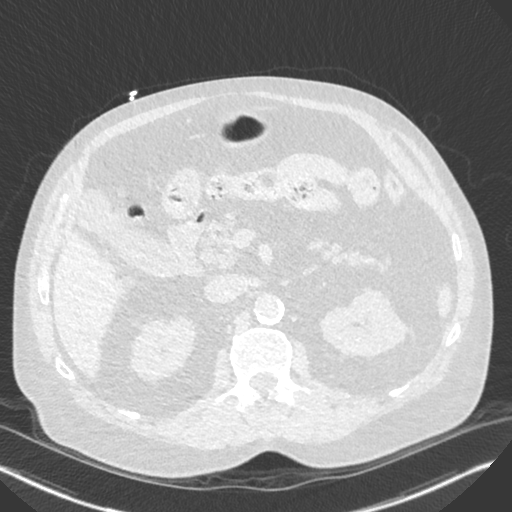
[im 53/471  soft-tissue]
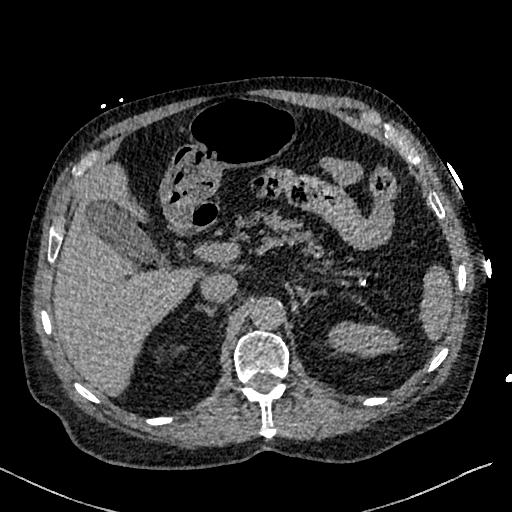
[im 79/471  lung]
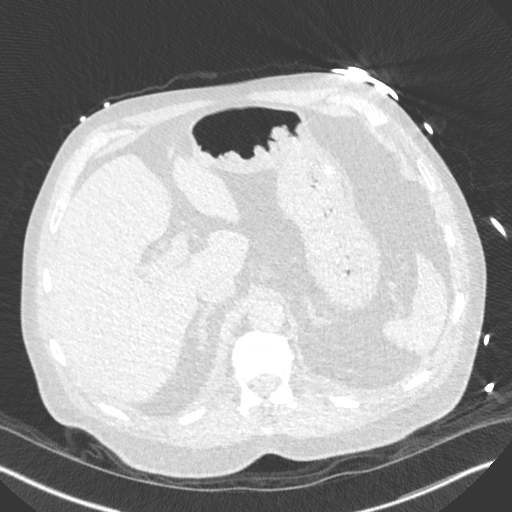
[im 105/471  soft-tissue]
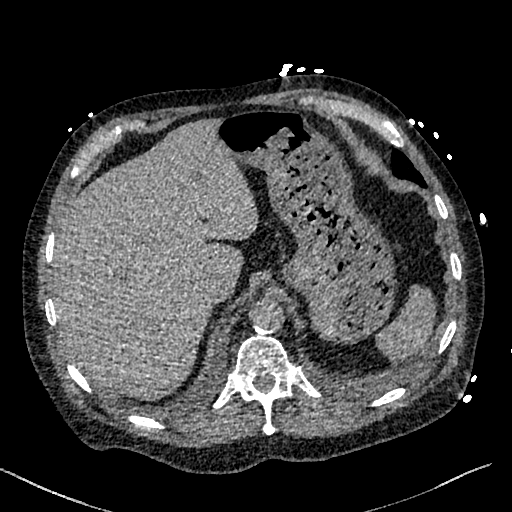
[im 157/471  lung]
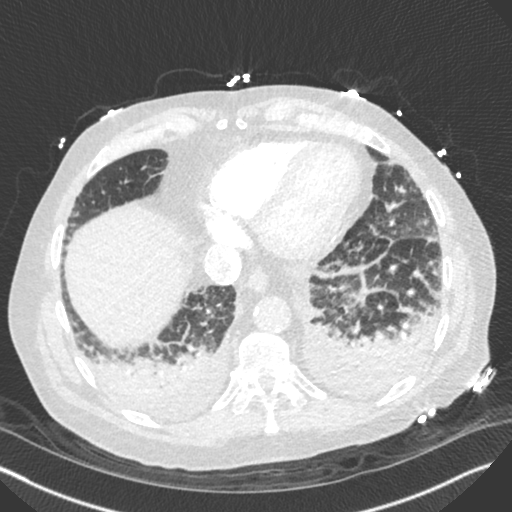
[im 183/471  soft-tissue]
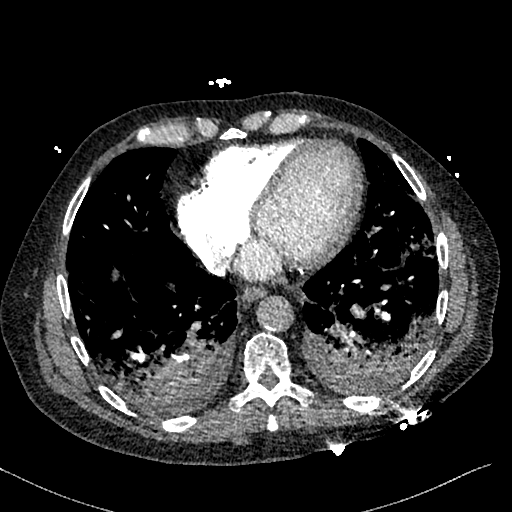
[im 209/471  lung]
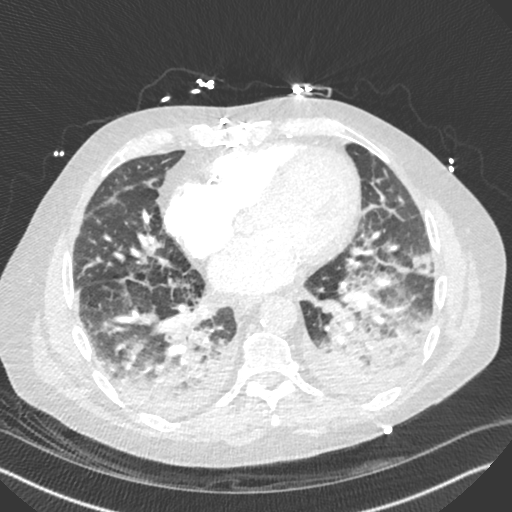
[im 236/471  soft-tissue]
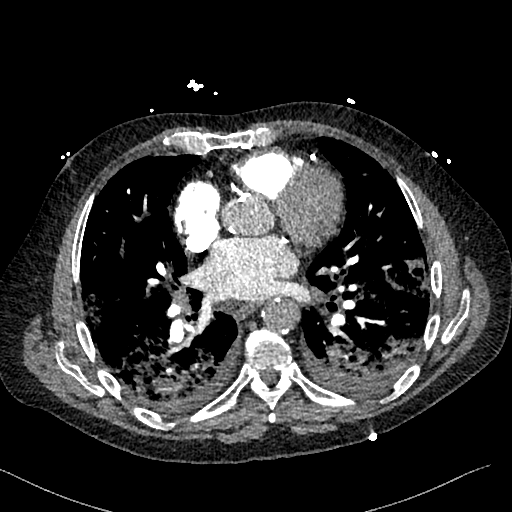
[im 262/471  lung]
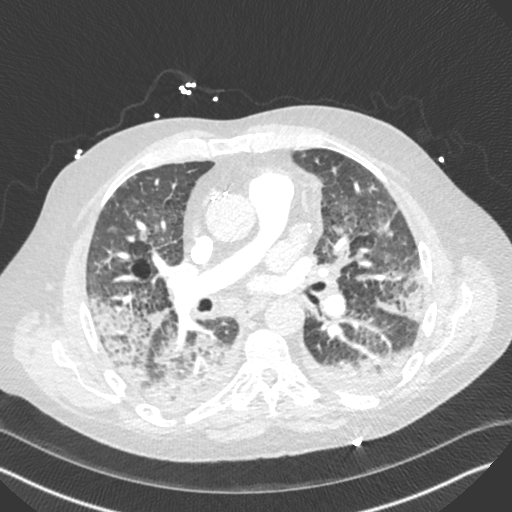
[im 288/471  soft-tissue]
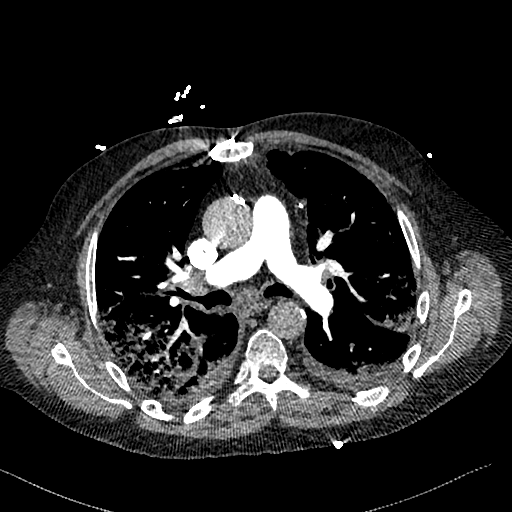
[im 314/471  lung]
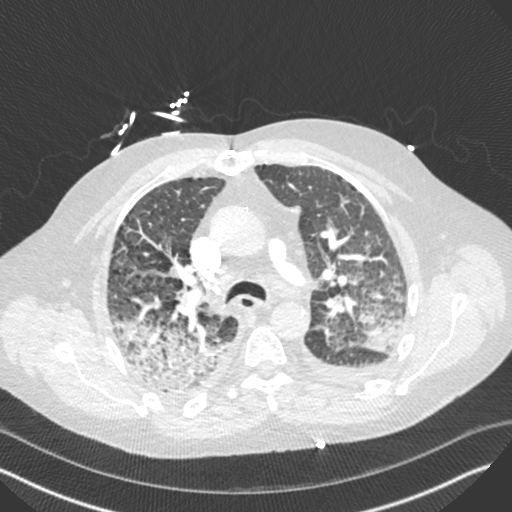
[im 366/471  soft-tissue]
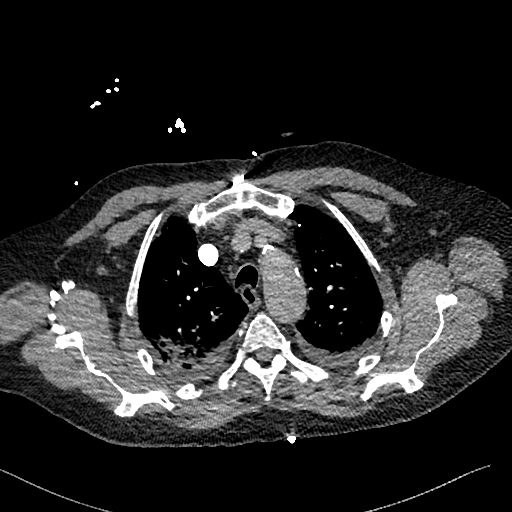
[im 392/471  lung]
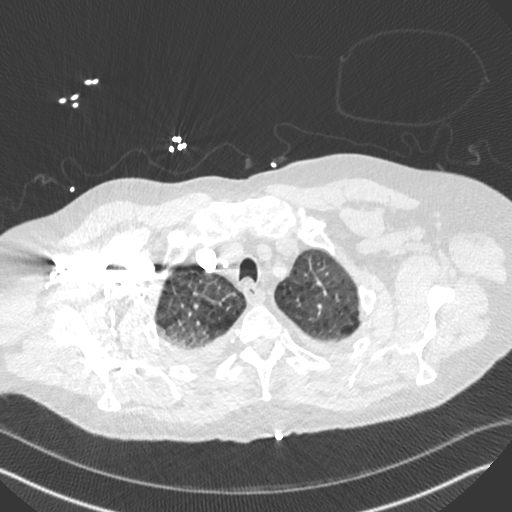
[im 418/471  soft-tissue]
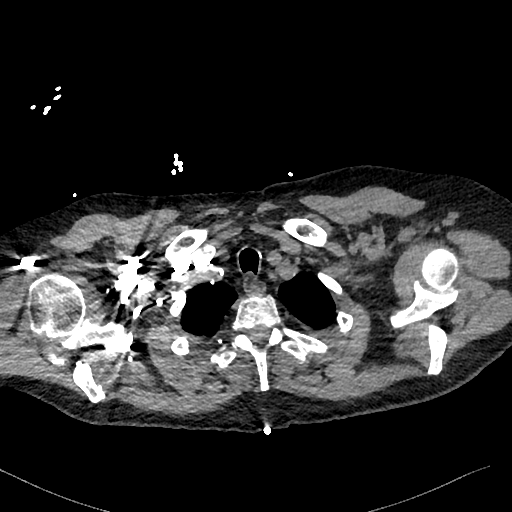
[im 444/471  lung]
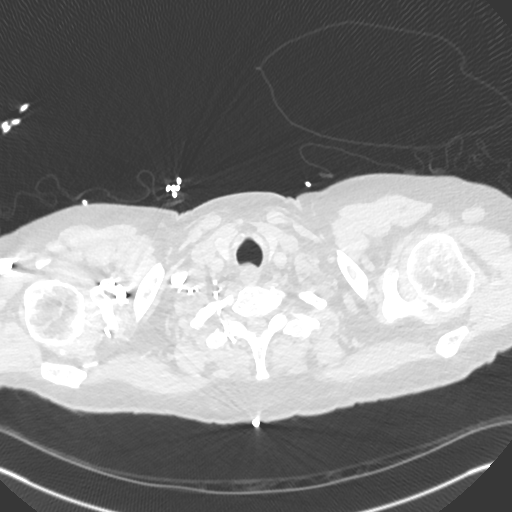

[Series 8: cor · coronal · 0.66mm/px · 3 of 147 slices shown]
[im 37/147  soft-tissue]
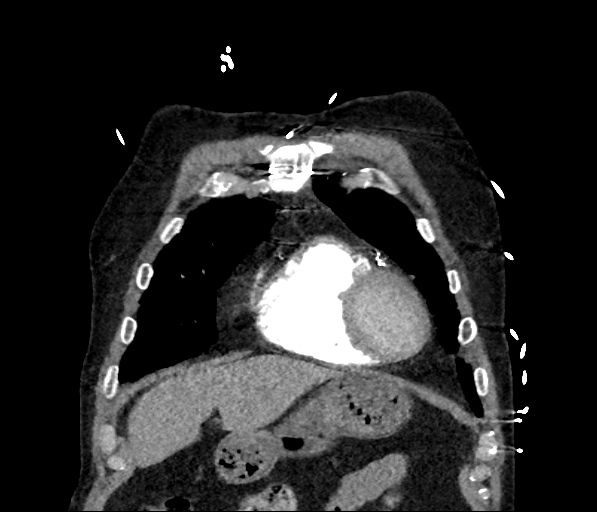
[im 74/147  soft-tissue]
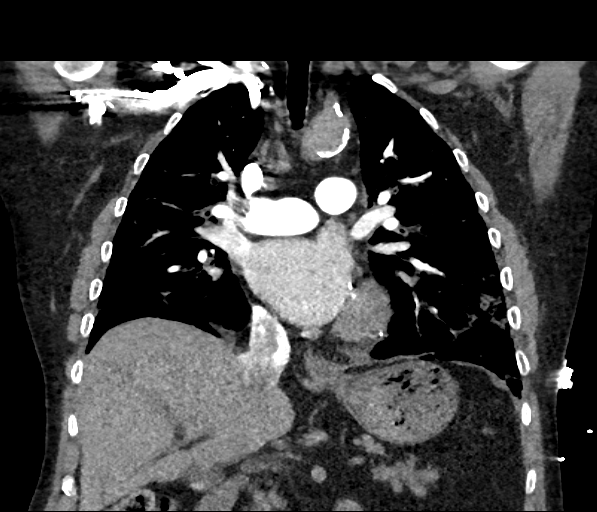
[im 110/147  soft-tissue]
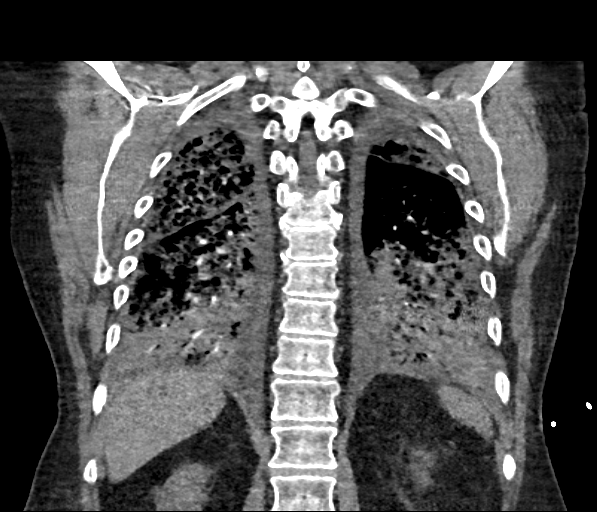

[18 of 46 positions shown; findings below may reference images not displayed]

FINDINGS: Cardiovascular:

--Pulmonary arteries: Contrast injection is sufficient to
demonstrate satisfactory opacification of the pulmonary arteries to
the segmental level. There is no pulmonary embolus. The main
pulmonary artery is within normal limits for size.

--Aorta: Limited opacification of the aorta due to bolus timing
optimization for the pulmonary arteries. Normal variant aortic arch
branching pattern with the left vertebral artery arising
independently from the aortic arch. The aortic course and caliber
are normal. There is moderate aortic atherosclerosis.

--Heart: Normal size. No pericardial effusion.

Mediastinum/Nodes: No mediastinal, hilar or axillary
lymphadenopathy. The visualized thyroid and thoracic esophageal
course are unremarkable.

Lungs/Pleura: Small pleural effusions with basilar atelectasis and
moderate pulmonary edema, worst in the dependent portions of the
lung.

Upper Abdomen: Contrast bolus timing is not optimized for evaluation
of the abdominal organs. Within this limitation, the visualized
organs of the upper abdomen are normal.

Musculoskeletal: No chest wall abnormality. No acute or significant
osseous findings.

Review of the MIP images confirms the above findings.
IMPRESSION: 1. No pulmonary embolus.
2. Small pleural effusions with basilar atelectasis and moderate
pulmonary edema.
3. Aortic Atherosclerosis ([facility]).

## 2021-02-13 NOTE — Progress Notes (Incomplete)
Cardiology Office Note:    Date:  02/13/2021   ID:  [name] [name], DOB January 12, 1950, MRN [id number]  PCP:  [name], MD  Cardiologist:  Sinclair Grooms, MD   Referring MD: [name], MD   No chief complaint on file.   History of Present Illness:    [name] [name] is a 71 y.o. male with a hx of CAD, prior history of CABG and percutaneous coronary intervention (most recently 2012), chronic combined systolic and diastolic heart failure (EF at cath 2016 was 40-50%), hypertension, hyperlipidemia, and type 2 diabetes.   ***  Past Medical History:  Diagnosis Date   Chest pain    a. experience weekly, nitrate responsive c/p x several years.   b. ranexa added on 08/2014 admission    Chronic combined systolic and diastolic CHF (congestive heart failure) ([place])    Claudication ([place])    a. 08/2014 ABI: R 0.79, L 1.0-->Walking program instituted.   Coronary artery disease    a. s/p MI in 1989;  b. 04/2008 CABG x 5 (LIMA->LAD, VG->D1, VG->RI, VG->PDA, L Radial->OM2);  c. 06/2010 PCI/DES to the Diag (2.25x28 Promus Element DES). VG->Diag occluded;  d. 08/2010 Attempted PTCA of the OM2 via the free radial graft. Unsuccessful.  Lesion felt to be 2/2 suture.  c. NSTEMI s/p [facility] with severe native and graph dz; D1 that was stented in 2012 now occulded: Rx therapy recommended   Coronary artery disease involving native coronary artery of native heart with angina pectoris ([place])    a. s/p MI in 1989;  b. 04/2008 CABG x 5 (LIMA->LAD, VG->D1, VG->RI, VG->PDA, L Radial->OM2);  c. 06/2010 PCI/DES to the Diag (2.25x28 Promus Element DES). VG->Diag occluded;  d. 08/2010 Attempted PTCA of the OM2 via the free radial graft. Unsuccessful.  Lesion felt to be 2/2 suture.  c. NSTEMI s/p [facility] with severe native and graph dz; Rx therapy recommended    Diabetes mellitus ([place]) 06/02/2011   Hyperlipidemia    Hypertension    NSTEMI (non-ST elevated myocardial infarction) ([place]) 08/29/2014   Peripheral arterial disease ([place])  08/16/2014   Type II diabetes mellitus ([place])     Past Surgical History:  Procedure Laterality Date   CARDIAC CATHETERIZATION N/A 08/28/2014   Procedure: Left Heart Cath and Cors/Grafts Angiography;  Surgeon: Belva Crome, MD;  Location: [place] [facility];  Service: Cardiovascular;  Laterality: N/A;   COLONOSCOPY     CORONARY ANGIOPLASTY WITH STENT PLACEMENT     CORONARY ARTERY BYPASS GRAFT     LEFT HEART CATH AND CORS/GRAFTS ANGIOGRAPHY N/A 03/20/2018   Procedure: LEFT HEART CATH AND CORS/GRAFTS ANGIOGRAPHY;  Surgeon: Martinique, Peter M, MD;  Location: [place] [facility];  Service: Cardiovascular;  Laterality: N/A;   PERIPHERAL VASCULAR CATHETERIZATION N/A 07/15/2015   Procedure: Abdominal Aortogram w/Lower Extremity;  Surgeon: Wellington Hampshire, MD;  Location: [place] [facility];  Service: Cardiovascular;  Laterality: N/A;   PERIPHERAL VASCULAR CATHETERIZATION Bilateral 07/15/2015   Procedure: Peripheral Vascular Intervention;  Surgeon: Wellington Hampshire, MD;  Location: [place] [facility];  Service: Cardiovascular;  Laterality: Bilateral;  Common Iliacs   SHOULDER ARTHROSCOPY WITH ROTATOR CUFF REPAIR AND SUBACROMIAL DECOMPRESSION Left 05/23/2013   Procedure: LEFT SHOULDER ARTHROSCOPY SUBACROMIAL  DECOMPRESSION DISTAL CLAVICLE RESECTION AND ROTATOR CUFF REPAIR ;  Surgeon: Marin Shutter, MD;  Location: [place];  Service: Orthopedics;  Laterality: Left;   SHOULDER SURGERY Right 7-8 years ago   Dr. Dorna Leitz    Current Medications: No  outpatient medications have been marked as taking for the 02/15/21 encounter (Appointment) with Belva Crome, MD.     Allergies:   Aldactone [spironolactone], [name] [dapagliflozin], Percocet [oxycodone-acetaminophen], Trulicity [dulaglutide], Avandia [rosiglitazone], and Codeine   Social History   Socioeconomic History   Marital status: Married    Spouse name: Not on file   Number of children: Not on file   Years of education: Not on file   Highest education  level: Not on file  Occupational History   Not on file  Tobacco Use   Smoking status: Former    Types: Cigarettes    Quit date: 01/04/1987    Years since quitting: 34.1   Smokeless tobacco: Former  Scientific laboratory technician Use: Never used  Substance and Sexual Activity   Alcohol use: Yes    Alcohol/week: 2.0 standard drinks    Types: 2 Cans of beer per week    Comment: a week   Drug use: No   Sexual activity: Not on file  Other Topics Concern   Not on file  Social History Narrative   Lives in [place] with his wife.  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   Social Determinants of Health   Financial Resource Strain: Not on file  Food Insecurity: Not on file  Transportation Needs: Not on file  Physical Activity: Not on file  Stress: Not on file  Social Connections: Not on file     Family History: The patient's family history includes Anesthesia problems in his brother; Diabetes in his brother; Heart Problems in his brother, brother, and sister; Heart attack in his father; Heart disease in his mother; Heart failure in his mother; Hypertension in his brother; Kidney failure in his brother; Lung cancer in his mother.  ROS:   Please see the history of present illness.    *** All other systems reviewed and are negative.  EKGs/Labs/Other Studies Reviewed:    The following studies were reviewed today:  Abdominal Duplex and Iliac Doppler 12/25/2020: Summary:  Abdominal Aorta: No evidence of an abdominal aortic aneurysm was  visualized. The largest aortic measurement is 2.5 cm. Severe  athereosclerosis noted throughout aorta, especially distal portion.  Stenosis: +-------------------+-------------+--------------+   Location            Stenosis      Stent            +-------------------+-------------+--------------+   Right Common Iliac                1-49% stenosis   +-------------------+-------------+--------------+   Left External Iliac >50% stenosis                   +-------------------+-------------+--------------+   Unable to visualize stent struts due to shadowing plaque. Atherosclerosis  noted throughout bilateral iliac arteries.   IVC/Iliac: There is no evidence of thrombus involving the IVC.     *See table(s) above for measurements and observations.  Suggest follow up study in 12 months.   LE ARTERIAL DUPLEX 12/25/2021: Summary:  Right: Extensive calcified heterogenous plaque seen throughout extremity.  Can not exclude distal popiteal artery occlusion due to bulky calcified  plaque seen.     Bilateral LE Doppler 12/25/2020: Summary:  Right: Resting right ankle-brachial index indicates moderate right lower  extremity arterial disease. The right toe-brachial index is abnormal.   Left: Resting left ankle-brachial index is within normal range. No  evidence of significant left lower extremity arterial disease. The left  toe-brachial index is abnormal.  ECHOCARDIOGRAM 12/2020: IMPRESSIONS     1. Left ventricular ejection fraction, by estimation, is 35 to 40%. The  left ventricle has moderately decreased function. The left ventricle  demonstrates global hypokinesis. Left ventricular diastolic parameters are  indeterminate.   2. Right ventricular systolic function is normal. The right ventricular  size is normal. Tricuspid regurgitation signal is inadequate for assessing  PA pressure.   3. Left atrial size was mildly dilated.   4. The mitral valve is grossly normal. Mild to moderate mitral valve  regurgitation. No evidence of mitral stenosis.   5. The aortic valve is calcified. Aortic valve regurgitation is not  visualized. Mild to moderate aortic valve stenosis. Aortic valve area, by  VTI measures 1.03 cm. Aortic valve mean gradient measures 13.0 mmHg.  Aortic valve Vmax measures 2.34 m/s.   Comparison(s): Prior images reviewed side by side. Changes from prior  study are noted. Compared to prior echo 11/12/2018, EF is reduced.    EKG:  EKG ***  Recent Labs: 11/29/2020: B Natriuretic Peptide 1,510.4; Hemoglobin 11.9; Platelets 158 12/11/2020: BUN 16; Creatinine, Ser 1.19; Potassium 4.7; Sodium 129  Recent Lipid Panel    Component Value Date/Time   CHOL 114 03/20/2018 1001   TRIG 58 03/20/2018 1001   HDL 47 03/20/2018 1001   CHOLHDL 2.4 03/20/2018 1001   VLDL 12 03/20/2018 1001   LDLCALC 55 03/20/2018 1001    Physical Exam:    VS:  There were no vitals taken for this visit.    Wt Readings from Last 3 Encounters:  12/01/20 158 lb (71.7 kg)  11/29/20 156 lb (70.8 kg)  08/12/20 162 lb (73.5 kg)     GEN: ***. No acute distress HEENT: Normal NECK: No JVD. LYMPHATICS: No lymphadenopathy CARDIAC: *** murmur. RRR *** gallop, or edema. VASCULAR: *** Normal Pulses. No bruits. RESPIRATORY:  Clear to auscultation without rales, wheezing or rhonchi  ABDOMEN: Soft, non-tender, non-distended, No pulsatile mass, MUSCULOSKELETAL: No deformity  SKIN: Warm and dry NEUROLOGIC:  Alert and oriented x 3 PSYCHIATRIC:  Normal affect   ASSESSMENT:    1. Coronary artery disease involving native coronary artery of native heart with other form of angina pectoris ([place])   2. Chronic combined systolic and diastolic CHF (congestive heart failure) ([place])   3. Essential hypertension   4. Other hyperlipidemia   5. Chronic systolic heart failure ([place])   6. PAD (peripheral artery disease) ([place])   7. Type 2 diabetes mellitus with other circulatory complication, without long-term current use of insulin (HCC)    PLAN:    In order of problems listed above:  ***   Medication Adjustments/Labs and Tests Ordered: Current medicines are reviewed at length with the patient today.  Concerns regarding medicines are outlined above.  No orders of the defined types were placed in this encounter.  No orders of the defined types were placed in this encounter.   There are no Patient Instructions on file for this visit.    Signed, Sinclair Grooms, MD  02/13/2021 12:11 PM    [place]

## 2021-02-15 ENCOUNTER — Ambulatory Visit: Payer: Medicare PPO | Admitting: Interventional Cardiology

## 2021-02-15 DIAGNOSIS — E1159 Type 2 diabetes mellitus with other circulatory complications: Secondary | ICD-10-CM

## 2021-02-15 DIAGNOSIS — I739 Peripheral vascular disease, unspecified: Secondary | ICD-10-CM

## 2021-02-15 DIAGNOSIS — E7849 Other hyperlipidemia: Secondary | ICD-10-CM

## 2021-02-15 DIAGNOSIS — I10 Essential (primary) hypertension: Secondary | ICD-10-CM

## 2021-02-15 DIAGNOSIS — I5042 Chronic combined systolic (congestive) and diastolic (congestive) heart failure: Secondary | ICD-10-CM

## 2021-02-15 DIAGNOSIS — I5022 Chronic systolic (congestive) heart failure: Secondary | ICD-10-CM

## 2021-02-15 DIAGNOSIS — I25118 Atherosclerotic heart disease of native coronary artery with other forms of angina pectoris: Secondary | ICD-10-CM

## 2021-02-24 DIAGNOSIS — E1151 Type 2 diabetes mellitus with diabetic peripheral angiopathy without gangrene: Secondary | ICD-10-CM | POA: Diagnosis not present

## 2021-02-24 DIAGNOSIS — E782 Mixed hyperlipidemia: Secondary | ICD-10-CM | POA: Diagnosis not present

## 2021-02-24 DIAGNOSIS — I5040 Unspecified combined systolic (congestive) and diastolic (congestive) heart failure: Secondary | ICD-10-CM | POA: Diagnosis not present

## 2021-02-24 DIAGNOSIS — I10 Essential (primary) hypertension: Secondary | ICD-10-CM | POA: Diagnosis not present

## 2021-02-28 ENCOUNTER — Other Ambulatory Visit: Payer: Self-pay | Admitting: Interventional Cardiology

## 2021-03-01 ENCOUNTER — Encounter: Payer: Self-pay | Admitting: Interventional Cardiology

## 2021-03-02 ENCOUNTER — Encounter: Payer: Self-pay | Admitting: Pulmonary Disease

## 2021-03-02 ENCOUNTER — Ambulatory Visit: Payer: Medicare PPO | Admitting: Pulmonary Disease

## 2021-03-02 ENCOUNTER — Other Ambulatory Visit: Payer: Self-pay

## 2021-03-02 VITALS — BP 136/70 | HR 60 | Ht 66.5 in | Wt 159.4 lb

## 2021-03-02 DIAGNOSIS — R918 Other nonspecific abnormal finding of lung field: Secondary | ICD-10-CM

## 2021-03-02 DIAGNOSIS — J432 Centrilobular emphysema: Secondary | ICD-10-CM

## 2021-03-02 MED ORDER — STIOLTO RESPIMAT 2.5-2.5 MCG/ACT IN AERS: 2.0000 | INHALATION_SPRAY | Freq: Every day | RESPIRATORY_TRACT | 0 refills | Status: DC

## 2021-03-02 NOTE — Progress Notes (Signed)
Patient seen in the office today and instructed on use of Stiolto.  Patient expressed understanding and demonstrated technique.  Benetta Spar [facility] 03/02/2021

## 2021-03-02 NOTE — Progress Notes (Signed)
Synopsis: Referred in February 2023 for left lower lobe consolidation by [name], MD  Subjective:   PATIENT ID: [name] [name] GENDER: male DOB: 10-31-1950, MRN: [id number]   HPI  Chief Complaint  Patient presents with   Consult    Referred by PCP for abnormal CT scan done earlier this month as well as chest congestion. Non productive cough.    [name] [name] is a 70 year old male, former smoker with coronary artery disease, hypertension, hyperlipidemia and DM II who is referred to pulmonary clinic for abnormal chest imaging.   Patient reports developing cough and chest congestion last fall with a chest radiograph 10/20/2020 showing a left lower lobe airspace opacity concerning for pneumonia.  He was treated with a round of antibiotics with initial improvement but return of his symptoms once regimen completed.  Repeat chest x-ray 11/11/2020 showed some improvement in the left lower lobe opacity.  He continued to have ongoing symptoms and repeat chest x-ray 11/29/2020 showed bibasilar infiltrates and he was treated with another round of antibiotics.  Follow-up chest x-ray 12/16/2020 showed persistent opacities of the bilateral lower lobes, greatest on the left.  He had a third round of antibiotics and a follow-up chest x-ray 01/08/2021 which again showed persistent left lower lobe opacity.  CT chest scan 02/05/2021 shows evidence of centrilobular emphysema.  There is airspace disease throughout a majority of the left lower lobe posterior segment and is dense/masslike along the subpleural area with possible areas of necrosis.  Patient has significant history of gastroesophageal reflux disease.  Despite using a wedge pillow for the last 5 to 8 years and acid suppression medications he continues to have nighttime awakenings due to reflux.  He denies any dysphagia.  He denies any skin rashes or joint pains.  He has exertional shortness of breath which she feels is improving since he first noticed it  last fall.  His generalized weakness is also improving.  He has wheezing with the shortness of breath.  He also has significant spring and fall allergies.  He uses Flonase and Claritin for his symptoms.  He is a former smoker and quit in 1989.  He has a 40+ pack year smoking history as he smoked for 25 years and was smoking 2 packs/day of majority of those years.  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  Past Medical History:  Diagnosis Date   Chest pain    a. experience weekly, nitrate responsive c/p x several years.   b. ranexa added on 08/2014 admission    Chronic combined systolic and diastolic CHF (congestive heart failure) ([place])    Claudication ([place])    a. 08/2014 ABI: R 0.79, L 1.0-->Walking program instituted.   Coronary artery disease    a. s/p MI in 1989;  b. 04/2008 CABG x 5 (LIMA->LAD, VG->D1, VG->RI, VG->PDA, L Radial->OM2);  c. 06/2010 PCI/DES to the Diag (2.25x28 Promus Element DES). VG->Diag occluded;  d. 08/2010 Attempted PTCA of the OM2 via the free radial graft. Unsuccessful.  Lesion felt to be 2/2 suture.  c. NSTEMI s/p [facility] with severe native and graph dz; D1 that was stented in 2012 now occulded: Rx therapy recommended   Coronary artery disease involving native coronary artery of native heart with angina pectoris ([place])    a. s/p MI in 1989;  b. 04/2008 CABG x 5 (LIMA->LAD, VG->D1, VG->RI, VG->PDA, L Radial->OM2);  c. 06/2010 PCI/DES to the Diag (2.25x28 Promus Element DES). VG->Diag occluded;  d. 08/2010 Attempted  PTCA of the OM2 via the free radial graft. Unsuccessful.  Lesion felt to be 2/2 suture.  c. NSTEMI s/p [facility] with severe native and graph dz; Rx therapy recommended    Diabetes mellitus ([place]) 06/02/2011   Hyperlipidemia    Hypertension    NSTEMI (non-ST elevated myocardial infarction) ([place]) 08/29/2014   Peripheral arterial disease ([place]) 08/16/2014   Type II diabetes mellitus ([place])      Family History  Problem Relation Age of Onset   Heart Problems Brother         heart valve replacement   Anesthesia problems Brother    Heart disease Mother    Lung cancer Mother    Heart failure Mother    Heart attack Father    Heart Problems Sister        had heart valve replacement   Hypertension Brother    Diabetes Brother    Kidney failure Brother    Heart Problems Brother      Social History   Socioeconomic History   Marital status: Married    Spouse name: Not on file   Number of children: Not on file   Years of education: Not on file   Highest education level: Not on file  Occupational History   Not on file  Tobacco Use   Smoking status: Former    Types: Cigarettes    Quit date: 01/04/1987    Years since quitting: 34.1   Smokeless tobacco: Former  Scientific laboratory technician Use: Never used  Substance and Sexual Activity   Alcohol use: Yes    Alcohol/week: 2.0 standard drinks    Types: 2 Cans of beer per week    Comment: a week   Drug use: No   Sexual activity: Not on file  Other Topics Concern   Not on file  Social History Narrative   Lives in [place] with his wife.  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   Social Determinants of Health   Financial Resource Strain: Not on file  Food Insecurity: Not on file  Transportation Needs: Not on file  Physical Activity: Not on file  Stress: Not on file  Social Connections: Not on file  Intimate Partner Violence: Not on file     Allergies  Allergen Reactions   Aldactone [Spironolactone] Nausea And Vomiting   Farxiga [Dapagliflozin]     Yeast infections   Percocet [Oxycodone-Acetaminophen] Other (See Comments)    "nightmares, stomach problems, and foggy brain"   Trulicity [Dulaglutide] Nausea And Vomiting   Avandia [Rosiglitazone] Other (See Comments)    Weight gain   Codeine Itching, Swelling and Other (See Comments)    Burning and heart flutter dizziness     Outpatient Medications Prior to Visit  Medication Sig Dispense Refill   acetaminophen (TYLENOL) 500 MG tablet Take 500 mg by mouth  daily as needed for mild pain.     albuterol (VENTOLIN HFA) 108 (90 Base) MCG/ACT inhaler Inhale 2 puffs into the lungs every 4 (four) hours as needed.     ALPRAZolam (XANAX) 0.25 MG tablet Take 0.25 mg by mouth 3 (three) times daily as needed for anxiety (nerves).     aspirin 81 MG tablet Take 81 mg by mouth daily.      atorvastatin (LIPITOR) 80 MG tablet Take 1 tablet by mouth once daily (Patient taking differently: Take 80 mg by mouth every evening.) 90 tablet 3   Azelastine HCl 0.15 % SOLN Place 1 spray into both nostrils daily.  carvedilol (COREG) 6.25 MG tablet TAKE 1 TABLET TWICE DAILY WITH A MEAL (Patient taking differently: Take 6.25 mg by mouth 2 (two) times daily with a meal.) 180 tablet 3   cholecalciferol (VITAMIN D) 1000 UNITS tablet Take 1,000 Units by mouth daily.      clopidogrel (PLAVIX) 75 MG tablet Take 75 mg by mouth daily with breakfast.     eplerenone (INSPRA) 25 MG tablet Take 1 tablet by mouth once daily 90 tablet 3   ezetimibe (ZETIA) 10 MG tablet TAKE 1 TABLET EVERY DAY (Patient taking differently: Take 10 mg by mouth daily.) 90 tablet 3   fluticasone (FLONASE) 50 MCG/ACT nasal spray Place 2 sprays into the nose daily. For nasal congestion     furosemide (LASIX) 40 MG tablet Take 1 tablet by mouth once daily (Patient taking differently: Take 40 mg by mouth daily.) 90 tablet 3   isosorbide mononitrate (IMDUR) 60 MG 24 hr tablet Take 1 tablet (60 mg total) by mouth daily. 90 tablet 3   metFORMIN (GLUCOPHAGE) 1000 MG tablet Take 1,000 mg by mouth 2 (two) times daily with a meal.     nitroGLYCERIN (NITROSTAT) 0.4 MG SL tablet Place 0.4 mg under the tongue every 5 (five) minutes x 3 doses as needed. For chest pain     Polyvinyl Alcohol-Povidone (CLEAR EYES ALL SEASONS OP) Place 1 drop into both eyes daily as needed (for dry eyes).     ranolazine (RANEXA) 500 MG 12 hr tablet TAKE 1 TABLET TWICE DAILY 180 tablet 2   sacubitril-valsartan (ENTRESTO) 97-103 MG Take 1 tablet by  mouth 2 (two) times daily. 60 tablet 11   Semaglutide,0.25 or 0.5MG /DOS, (OZEMPIC, 0.25 OR 0.5 MG/DOSE,) 2 MG/1.5ML SOPN Inject 0.5 mg into the skin once a week.     No facility-administered medications prior to visit.    Review of Systems  Constitutional:  Negative for chills, fever, malaise/fatigue and weight loss.  HENT:  Positive for congestion. Negative for sinus pain and sore throat.   Eyes: Negative.   Respiratory:  Positive for cough. Negative for hemoptysis, sputum production, shortness of breath and wheezing.   Cardiovascular:  Negative for chest pain, palpitations, orthopnea, claudication and leg swelling.  Gastrointestinal:  Positive for heartburn. Negative for abdominal pain, nausea and vomiting.  Genitourinary: Negative.   Musculoskeletal:  Negative for joint pain and myalgias.  Skin:  Negative for rash.  Neurological:  Negative for weakness.  Endo/Heme/Allergies: Negative.   Psychiatric/Behavioral: Negative.       Objective:   Vitals:   03/02/21 1422  BP: 136/70  Pulse: 60  SpO2: 99%  Weight: 159 lb 6.4 oz (72.3 kg)  Height: 5' 6.5" (1.689 m)     Physical Exam Constitutional:      General: He is not in acute distress. HENT:     Head: Normocephalic and atraumatic.  Eyes:     Extraocular Movements: Extraocular movements intact.     Conjunctiva/sclera: Conjunctivae normal.     Pupils: Pupils are equal, round, and reactive to light.  Cardiovascular:     Rate and Rhythm: Normal rate and regular rhythm.     Pulses: Normal pulses.     Heart sounds: Normal heart sounds. No murmur heard. Pulmonary:     Effort: Pulmonary effort is normal.     Breath sounds: Rales (left base) present. No wheezing or rhonchi.  Abdominal:     General: Bowel sounds are normal.     Palpations: Abdomen is soft.  Musculoskeletal:  Right lower leg: No edema.     Left lower leg: No edema.  Lymphadenopathy:     Cervical: No cervical adenopathy.  Skin:    General: Skin is warm  and dry.  Neurological:     General: No focal deficit present.     Mental Status: He is alert.  Psychiatric:        Mood and Affect: Mood normal.        Behavior: Behavior normal.        Thought Content: Thought content normal.        Judgment: Judgment normal.      CBC    Component Value Date/Time   WBC 10.2 11/29/2020 0241   RBC 3.85 (L) 11/29/2020 0241   HGB 11.9 (L) 11/29/2020 0241   HGB 13.9 11/22/2018 1056   HCT 35.9 (L) 11/29/2020 0241   HCT 40.9 11/22/2018 1056   PLT 158 11/29/2020 0241   PLT 169 11/22/2018 1056   MCV 93.2 11/29/2020 0241   MCV 93 11/22/2018 1056   MCH 30.9 11/29/2020 0241   MCHC 33.1 11/29/2020 0241   RDW 13.2 11/29/2020 0241   RDW 12.2 11/22/2018 1056   LYMPHSABS 1.7 11/29/2020 0241   MONOABS 0.8 11/29/2020 0241   EOSABS 0.1 11/29/2020 0241   BASOSABS 0.0 11/29/2020 0241   BMP Latest Ref Rng & Units 12/11/2020 11/29/2020 10/15/2019  Glucose 70 - 99 mg/dL 219(H) 168(H) 142(H)  BUN 8 - 27 mg/dL 16 15 17   Creatinine 0.76 - 1.27 mg/dL 1.19 1.18 1.27  BUN/Creat Ratio 10 - 24 13 - 13  Sodium 134 - 144 mmol/L 129(L) 130(L) 135  Potassium 3.5 - 5.2 mmol/L 4.7 4.1 4.8  Chloride 96 - 106 mmol/L 94(L) 99 96  CO2 20 - 29 mmol/L 20 21(L) 25  Calcium 8.6 - 10.2 mg/dL 9.5 9.1 10.1   Chest imaging: CT Chest w/ contrast 02/05/21 Emphysema with posterior left lower lobe airspace disease. Given persistence by chest x-ray over at least a 2 month time frame and some appearance suggestive of more mass-like consolidation in the posterior and basilar aspect of the left lower lobe with areas of potential necrosis, there would be some concern of potential underlying neoplasm. Correlation with bronchoscopy may therefore be helpful.   PFT: No flowsheet data found.  Labs:  Path:  Echo 12/23/20: LV EF 35-40%. RV function and size is normal. LA is mildly dilated. Mild to moderate mitral valve regurgitation.   Heart Catheterization:  Assessment & Plan:    Centrilobular emphysema (HCC)  Mass of lower lobe of left lung  Discussion: [name] [name] is a 71 year old male, former smoker with coronary artery disease, hypertension, hyperlipidemia and DM II who is referred to pulmonary clinic for abnormal chest imaging.   He has persistent left lower lobe airspace disease along with dense consolidations with a masslike appearance in some areas of possible necrosis.  These findings are concerning for malignancy vs. Aspiration pneumonia vs inflammatory process.   I discussed the findings and we reviewed the CT chest scan together.  I will reach out to my colleagues regarding a possible navigation bronchoscopy versus CT-guided lung biopsy by interventional radiology for further work-up.  We can also consider a PET scan for further evaluation as well.  For patient's centrilobular emphysema, shortness of breath and wheezing he is to start Stiolto inhaler 2 puffs daily.  We have provided him with a sample today and he is to let us know if he notices improvement and  we will send in a prescription for him to continue on this medication.  Follow-up in 2 months with pulmonary function test.  Freda Jackson, MD [facility] Office: [phone]   Current Outpatient Medications:    acetaminophen (TYLENOL) 500 MG tablet, Take 500 mg by mouth daily as needed for mild pain., Disp: , Rfl:    albuterol (VENTOLIN HFA) 108 (90 Base) MCG/ACT inhaler, Inhale 2 puffs into the lungs every 4 (four) hours as needed., Disp: , Rfl:    ALPRAZolam (XANAX) 0.25 MG tablet, Take 0.25 mg by mouth 3 (three) times daily as needed for anxiety (nerves)., Disp: , Rfl:    aspirin 81 MG tablet, Take 81 mg by mouth daily. , Disp: , Rfl:    atorvastatin (LIPITOR) 80 MG tablet, Take 1 tablet by mouth once daily (Patient taking differently: Take 80 mg by mouth every evening.), Disp: 90 tablet, Rfl: 3   Azelastine HCl 0.15 % SOLN, Place 1 spray into both nostrils daily.,  Disp: , Rfl:    carvedilol (COREG) 6.25 MG tablet, TAKE 1 TABLET TWICE DAILY WITH A MEAL (Patient taking differently: Take 6.25 mg by mouth 2 (two) times daily with a meal.), Disp: 180 tablet, Rfl: 3   cholecalciferol (VITAMIN D) 1000 UNITS tablet, Take 1,000 Units by mouth daily. , Disp: , Rfl:    clopidogrel (PLAVIX) 75 MG tablet, Take 75 mg by mouth daily with breakfast., Disp: , Rfl:    eplerenone (INSPRA) 25 MG tablet, Take 1 tablet by mouth once daily, Disp: 90 tablet, Rfl: 3   ezetimibe (ZETIA) 10 MG tablet, TAKE 1 TABLET EVERY DAY (Patient taking differently: Take 10 mg by mouth daily.), Disp: 90 tablet, Rfl: 3   fluticasone (FLONASE) 50 MCG/ACT nasal spray, Place 2 sprays into the nose daily. For nasal congestion, Disp: , Rfl:    furosemide (LASIX) 40 MG tablet, Take 1 tablet by mouth once daily (Patient taking differently: Take 40 mg by mouth daily.), Disp: 90 tablet, Rfl: 3   isosorbide mononitrate (IMDUR) 60 MG 24 hr tablet, Take 1 tablet (60 mg total) by mouth daily., Disp: 90 tablet, Rfl: 3   metFORMIN (GLUCOPHAGE) 1000 MG tablet, Take 1,000 mg by mouth 2 (two) times daily with a meal., Disp: , Rfl:    nitroGLYCERIN (NITROSTAT) 0.4 MG SL tablet, Place 0.4 mg under the tongue every 5 (five) minutes x 3 doses as needed. For chest pain, Disp: , Rfl:    Polyvinyl Alcohol-Povidone (CLEAR EYES ALL SEASONS OP), Place 1 drop into both eyes daily as needed (for dry eyes)., Disp: , Rfl:    ranolazine (RANEXA) 500 MG 12 hr tablet, TAKE 1 TABLET TWICE DAILY, Disp: 180 tablet, Rfl: 2   sacubitril-valsartan (ENTRESTO) 97-103 MG, Take 1 tablet by mouth 2 (two) times daily., Disp: 60 tablet, Rfl: 11   Semaglutide,0.25 or 0.5MG /DOS, (OZEMPIC, 0.25 OR 0.5 MG/DOSE,) 2 MG/1.5ML SOPN, Inject 0.5 mg into the skin once a week., Disp: , Rfl:

## 2021-03-02 NOTE — Patient Instructions (Addendum)
You have evidence of emphysema on CT Chest scan and a left lower lobe mass with scattered infiltrate. This is concerning for aspiration vs. Cancer.  I will talk with my colleagues about the biopsy via bronchoscopy or with Interventional radiology.    Try stiolto inhaler 2 puffs twice daily - if you notice improvement in your shortness of breath please message [place] and we will send in a prescription  We will have you follow up in 2 months with pulmonary function tests

## 2021-03-03 ENCOUNTER — Encounter: Payer: Self-pay | Admitting: Pulmonary Disease

## 2021-03-10 ENCOUNTER — Telehealth: Payer: Self-pay | Admitting: Pulmonary Disease

## 2021-03-10 DIAGNOSIS — R918 Other nonspecific abnormal finding of lung field: Secondary | ICD-10-CM

## 2021-03-10 NOTE — Telephone Encounter (Signed)
I called patient on Monday and left a VM. I spoke with Dr. Valeta Harms and we both recommend moving forward with bronchoscopy for biopsies of the Left lower lobe abnormalities on CT chest scan. ? ?If patient is ok with moving forward with bronchoscopy then I will let Dr. Valeta Harms know so he can get him scheduled. If he would like to meet with Dr. Valeta Harms first, then we can set up an office visit this week or next to discuss the procedure. I am happy to give him a call as well. ? ?Thanks, ?JD ?

## 2021-03-10 NOTE — Telephone Encounter (Signed)
Spoke to patient. ?He would like to proceed with bronchoscopy. He does not wish to schedule an OV prior to bx.  ? ?Dr. Valeta Harms and Dr. Erin Fulling, please advise. thanks ?

## 2021-03-12 ENCOUNTER — Encounter: Payer: Self-pay | Admitting: Pulmonary Disease

## 2021-03-12 DIAGNOSIS — R918 Other nonspecific abnormal finding of lung field: Secondary | ICD-10-CM | POA: Insufficient documentation

## 2021-03-12 NOTE — Telephone Encounter (Signed)
PCCM: ? ?Orders placed for bronchoscopy  ? ?Sent to [facility] ? ?Thanks ? ?BLI  ? ?

## 2021-03-12 NOTE — Addendum Note (Signed)
Addended by: Garner Nash on: 03/12/2021 09:01 AM ? ? Modules accepted: Orders ? ?

## 2021-03-19 ENCOUNTER — Other Ambulatory Visit: Payer: Self-pay | Admitting: Pulmonary Disease

## 2021-03-19 ENCOUNTER — Other Ambulatory Visit: Payer: Self-pay

## 2021-03-19 MED ORDER — STIOLTO RESPIMAT 2.5-2.5 MCG/ACT IN AERS
2.0000 | INHALATION_SPRAY | Freq: Every day | RESPIRATORY_TRACT | 5 refills | Status: DC
  Filled 2021-03-19: qty 8, 30d supply, fill #0

## 2021-03-20 ENCOUNTER — Encounter: Payer: Self-pay | Admitting: Interventional Cardiology

## 2021-03-20 DIAGNOSIS — I5042 Chronic combined systolic (congestive) and diastolic (congestive) heart failure: Secondary | ICD-10-CM

## 2021-03-22 ENCOUNTER — Encounter: Payer: Self-pay | Admitting: Pulmonary Disease

## 2021-03-22 ENCOUNTER — Other Ambulatory Visit: Payer: Self-pay

## 2021-03-23 MED ORDER — STIOLTO RESPIMAT 2.5-2.5 MCG/ACT IN AERS: 2.0000 | INHALATION_SPRAY | Freq: Every day | RESPIRATORY_TRACT | 5 refills | Status: DC

## 2021-03-24 ENCOUNTER — Other Ambulatory Visit: Payer: Medicare PPO | Admitting: *Deleted

## 2021-03-24 ENCOUNTER — Other Ambulatory Visit: Payer: Self-pay

## 2021-03-24 DIAGNOSIS — I5042 Chronic combined systolic (congestive) and diastolic (congestive) heart failure: Secondary | ICD-10-CM

## 2021-03-25 ENCOUNTER — Encounter: Payer: Self-pay | Admitting: Pulmonary Disease

## 2021-03-25 ENCOUNTER — Encounter: Payer: Self-pay | Admitting: Interventional Cardiology

## 2021-03-25 LAB — HEPATIC FUNCTION PANEL
ALT: 90 IU/L — ABNORMAL HIGH (ref 0–44)
AST: 56 IU/L — ABNORMAL HIGH (ref 0–40)
Albumin: 3.3 g/dL — ABNORMAL LOW (ref 3.7–4.7)
Alkaline Phosphatase: 145 IU/L — ABNORMAL HIGH (ref 44–121)
Bilirubin Total: 0.8 mg/dL (ref 0.0–1.2)
Bilirubin, Direct: 0.4 mg/dL (ref 0.00–0.40)
Total Protein: 6.3 g/dL (ref 6.0–8.5)

## 2021-03-25 LAB — BASIC METABOLIC PANEL
BUN/Creatinine Ratio: 11 (ref 10–24)
BUN: 13 mg/dL (ref 8–27)
CO2: 20 mmol/L (ref 20–29)
Calcium: 8.7 mg/dL (ref 8.6–10.2)
Chloride: 93 mmol/L — ABNORMAL LOW (ref 96–106)
Creatinine, Ser: 1.15 mg/dL (ref 0.76–1.27)
Glucose: 155 mg/dL — ABNORMAL HIGH (ref 70–99)
Potassium: 4.5 mmol/L (ref 3.5–5.2)
Sodium: 127 mmol/L — ABNORMAL LOW (ref 134–144)
eGFR: 68 mL/min/{1.73_m2} (ref >59)

## 2021-03-25 LAB — CBC
Hematocrit: 30 % — ABNORMAL LOW (ref 37.5–51.0)
Hemoglobin: 10 g/dL — ABNORMAL LOW (ref 13.0–17.7)
MCH: 29.8 pg (ref 26.6–33.0)
MCHC: 33.3 g/dL (ref 31.5–35.7)
MCV: 89 fL (ref 79–97)
Platelets: 306 10*3/uL (ref 150–450)
RBC: 3.36 x10E6/uL — ABNORMAL LOW (ref 4.14–5.80)
RDW: 13.2 % (ref 11.6–15.4)
WBC: 9.3 10*3/uL (ref 3.4–10.8)

## 2021-03-25 LAB — PRO B NATRIURETIC PEPTIDE: NT-Pro BNP: 4275 pg/mL — ABNORMAL HIGH (ref 0–376)

## 2021-03-25 NOTE — Telephone Encounter (Signed)
Dr. Erin Fulling please advise on patient mychart message. Thank you  ? ?Dr.Dewald, Dr Tamala Julian (my cardiologist) called this morning with result of blood workup. Appears I'm losing blood somewhere in my system causing my blood pressures to be lower than ususal. Im wondering if this is going to have an effect on our upcoming hosp procedure. Should I go ahead? or should I delay it? ?

## 2021-03-26 ENCOUNTER — Encounter (HOSPITAL_COMMUNITY): Payer: Self-pay | Admitting: Pulmonary Disease

## 2021-03-26 ENCOUNTER — Other Ambulatory Visit: Payer: Self-pay

## 2021-03-26 ENCOUNTER — Other Ambulatory Visit: Payer: Self-pay | Admitting: *Deleted

## 2021-03-26 NOTE — Progress Notes (Signed)
Spoke with pt for pre-op call. Pt has an extensive cardiac history - CAD with CABG, stents and heart attack. Dr. Daneen Schick is his cardiologist. Pt states he is diabetic. Instructed pt not to take his Metformin day of the procedure. Instructed him to check his blood sugar when he wakes up Tuesday AM. If blood sugar is 70 or below, treat with 1/2 cup of clear juice (apple or cranberry) and recheck blood sugar 15 minutes after drinking juice. If blood sugar continues to be 70 or below, call the Short Stay department and ask to speak to a nurse. Pt voiced understanding. ? ? ?Shower instructions given to pt. ? ?Pt will need Covid testing on arrival.  ?

## 2021-03-26 NOTE — Telephone Encounter (Signed)
Called and spoke with patient to let him know that we are going to proceed with bronch. He expressed understanding. Nothing further needed at this time.  ?

## 2021-03-29 ENCOUNTER — Telehealth: Payer: Self-pay | Admitting: Pulmonary Disease

## 2021-03-29 ENCOUNTER — Encounter (HOSPITAL_COMMUNITY): Payer: Self-pay | Admitting: Vascular Surgery

## 2021-03-29 NOTE — Telephone Encounter (Addendum)
Lm for both patient. ?ATC patient's spouse, Anne(EC) unable to leave vm due to mailbox being full. ? ? ?

## 2021-03-29 NOTE — Progress Notes (Signed)
Anesthesia Chart Review: SAME DAY WORK-UP ? Case: [id number] Date/Time: 03/30/21 0915  ? Procedures:  ?    ROBOTIC ASSISTED NAVIGATIONAL BRONCHOSCOPY (Left) - ION w/ CIOS ?    VIDEO BRONCHOSCOPY WITH ENDOBRONCHIAL ULTRASOUND (Bilateral)  ? Anesthesia type: General  ? Diagnosis: Mass of lower lobe of left lung [R91.8]  ? Pre-op diagnosis: lung mass  ? [facility] / [facility]  ? Surgeons: Garner Nash, DO  ? ?  ? ? ?DISCUSSION: Patient is a 71 year old male scheduled for the above procedure. ? ?History includes former smoker (quit 01/04/87), CAD (inferolateral MI 1989 with occluded CX; s/p CABG (LIMA-LAD, SVG-D1, SVG-RAMUS, SVG-PDA, free left RA-OM2) 04/25/08 with graft failure DIAG and RAMUS 07/28/08; NSTEMI s/p DES-D1 06/24/10; NSTEMI, s/p angioplasty left RA-OM2, undilatable distal to graft insertion 08/04/10; NSTEMI 08/27/14, medical therapy; 03/20/18 LHC:LM occluded since 2016, SVG-OM1 no longer supplies retrograde flow to dLCX, graft status unchanged, no targets for revascularization, OM distal to the SVG is too small for PCI, optimize medical management) with stable angina, chronic systolic and diastolic CHF, HTN, HLD, DM2, PAD (bilateral CIA stents 07/15/15). ? ?Last cardiology evaluation on 12/01/20 by Dr. Fletcher Anon. Patient had ED visit after Thanksgiving for increased SOB with BNP 1500 treated with iv furosemide. Patient was overall feeling better since, but had had increased episodes of chest pain requiring Nitro. Amlodipine was discontinued for edema, and Imdur increased. Increased sodium intake over Thanksgiving thought to be contributor to acute CHF episode, but echo ordered and showed LVEF decreased from 35-40% (from 60-65% 11/2018), global LV hypokinesis, mild-moderate MR/AS. Dr. Tamala Julian reviewed and notations (from 01/2021) suggest he was going to see patient in February 2023 and determine whether to continue/maximize medical therapy versus pursue a repeat cardiac cath (no targets/PCI  options per 03/20/18 cath). The 02/15/21 visit was cancelled, although patient has had some email communication this month about his recent BP trends and lab results.  Eplerenone was held due to weakness with mild hypotension. He was also referred to PCP for anemia with labs (scheduled 04/07/21). ? ?Patient reached out to Dr. Erin Fulling last week regarding 03/24/21 labs results showing Na 127, Cr 1.15, alk phos 145, albumin 3.3, AST 56, ALT 90. H/H 10.0/30.0. He felt labs acceptable for OR, as long as patient stable from cardiac standpoint. ? ?I reached out to Dr. Erin Fulling and Dr. Valeta Harms regarding any communication with cardiology regarding procedure given LV depression on last echo and cancelled follow-up in February. Patient had been having increased episodes of chest pain in November, but also in setting of bibasilar infiltrates, L > R. Now with possible underlying LLL mass. No targets/PCI options by 03/2018 cath, so unclear if repeat cath would be pursued if symptoms stable; however, given his history and missed follow-up would recommended preoperative cardiology input.  Also no instructions noted regarding perioperative ASA/Plavix. ? ?Via Secure Chat messages on 03/29/21 per Dr. Daneen Schick, "Just looked at his prior cath. If not having angina. Will just make adjustments in meds prior to bronch. Will be okay to hold plavix." ? ?At this time, it appears pulmonology is going to postpone surgery. ? ? ?VS:  ?BP Readings from Last 3 Encounters:  ?03/02/21 136/70  ?12/01/20 (!) 148/72  ?11/29/20 (!) 145/66  ? ?Pulse Readings from Last 3 Encounters:  ?03/02/21 60  ?12/01/20 70  ?11/29/20 70  ?  ? ?PROVIDERS: ?[name], MD is PCP  ?Daneen Schick, MD is cardiologist ?Kathlyn Sacramento, MD is [place] cardiologist ?Freda Jackson,  MD is pulmonologist ? ? ?LABS: Currently, last lab results include: ?Lab Results  ?Component Value Date  ? WBC 9.3 03/24/2021  ? HGB 10.0 (L) 03/24/2021  ? HCT 30.0 (L) 03/24/2021  ? PLT 306 03/24/2021  ?  GLUCOSE 155 (H) 03/24/2021  ? ALT 90 (H) 03/24/2021  ? AST 56 (H) 03/24/2021  ? NA 127 (L) 03/24/2021  ? K 4.5 03/24/2021  ? CL 93 (L) 03/24/2021  ? CREATININE 1.15 03/24/2021  ? BUN 13 03/24/2021  ? CO2 20 03/24/2021  ? ? ?IMAGES: ?CT Chest 02/05/21: ?IMPRESSION: ?Emphysema with posterior left lower lobe airspace disease. Given ?persistence by chest x-ray over at least a 2 month time frame and ?some appearance suggestive of more mass-like consolidation in the ?posterior and basilar aspect of the left lower lobe with areas of ?potential necrosis, there would be some concern of potential ?underlying neoplasm. Correlation with bronchoscopy may therefore be ?helpful. ?  ? ? ?EKG: 11/29/20: ?Sinus rhythm with 1st degree A-V block ?ST & T wave abnormality, consider lateral ischemia ?Abnormal ECG ?No significant change was found ?Confirmed by Gerlene Fee [phone]) on 11/29/2020 6:12:10 AM ? ? ?CV: ?Aorta/Iliac Arterial US 12/25/20: ?Summary:  ?Abdominal Aorta: No evidence of an abdominal aortic aneurysm was  ?visualized. The largest aortic measurement is 2.5 cm. Severe  ?athereosclerosis noted throughout aorta, especially distal portion.  ?Stenosis: +-------------------+-------------+--------------+  ?Location           Stenosis     Stent           ?+-------------------+-------------+--------------+  ?Right Common Iliac              1-49% stenosis  ?+-------------------+-------------+--------------+  ?Left External Iliac>50% stenosis                ?+-------------------+-------------+--------------+  ?- Unable to visualize stent struts due to shadowing plaque. Atherosclerosis  ?noted throughout bilateral iliac arteries.  ?I- VC/Iliac: There is no evidence of thrombus involving the IVC.  ?- Suggest follow up study in 12 months.  ? ?Echo 12/23/20: ?IMPRESSIONS  ? 1. Left ventricular ejection fraction, by estimation, is 35 to 40%. The  ?left ventricle has moderately decreased function. The left ventricle   ?demonstrates global hypokinesis. Left ventricular diastolic parameters are  ?indeterminate.  ? 2. Right ventricular systolic function is normal. The right ventricular  ?size is normal. Tricuspid regurgitation signal is inadequate for assessing  ?PA pressure.  ? 3. Left atrial size was mildly dilated.  ? 4. The mitral valve is grossly normal. Mild to moderate mitral valve  ?regurgitation. No evidence of mitral stenosis.  ? 5. The aortic valve is calcified. Aortic valve regurgitation is not  ?visualized. Mild to moderate aortic valve stenosis. Aortic valve area, by  ?VTI measures 1.03 cm?Marland Kitchen Aortic valve mean gradient measures 13.0 mmHg.  ?Aortic valve Vmax measures 2.34 m/s.  ?- Comparison(s): Prior images reviewed side by side. Changes from prior  ?study are noted. Compared to prior echo 11/12/2018 [EF 60-65%], EF is reduced.  ? ? ?Cath 03/20/2018 ?Prox RCA to Dist RCA lesion is 100% stenosed. ?Ost LM to [facility] LAD lesion is 100% stenosed with 100% stenosed side branch in Ost Cx. ?Left radial artery graft was visualized by angiography and is normal in caliber. ?1st Mrg lesion is 90% stenosed. ?Ost 1st Mrg to 1st Mrg lesion is 100% stenosed. ?Ost Ramus to Ramus lesion is 100% stenosed. ?Ost 1st Diag lesion is 100% stenosed. ?Ost LAD to Prox LAD lesion is 100% stenosed. ?SVG graft was  visualized by angiography and is normal in caliber. ?The graft exhibits mild . ?LIMA graft was visualized by angiography and is normal in caliber. ?The graft exhibits no disease. ?LV end diastolic pressure is mildly elevated. ?Origin lesion is 100% stenosed. ?  ?1. Native vessel occlusive CAD involving the left main Coronary and RCA ?2. Patent LIMA to the LAD ?3. Patent free radial artery graft to the OM1. There is severe stenosis in the native vessel distal to the graft that is chronic ?4. Patent SVG to PDA ?5. Occluded SVG to diagonal ?6. Elevated LVEDP 25 mm Hg.  ?  ?Plan: compared to prior study in 2016 the left main is now occluded. Also  the SVG to OM1 no longer supplies retrograde flow to the distal LCx. Graft status is unchanged.  No targets for revascularization. The OM distal to the SVG is too small for PCI. Optimize medical management.  ? ? ?US Carotid 3/15/1

## 2021-03-29 NOTE — Telephone Encounter (Signed)
Patient is returning phone call. Patient phone number is [phone]. ?

## 2021-03-29 NOTE — Telephone Encounter (Signed)
I left VM with patient about postponing his bronchoscopy for tomorrow due to concerns about his heart function. He needs to be evaluated by his cardiologist Dr. Tamala Julian prior to this procedure. I have notified Dr. Tamala Julian and [facility].  ? ?If you can try to get in touch with patient today about this info, that would be great. ? ?Thanks, ?JD ?

## 2021-03-29 NOTE — Telephone Encounter (Signed)
Spoke to patient and relayed below message. He voiced his understanding.  ?He will contact Dr. Thompson Caul office for an appt.  ?Nothing further needed. ? ?Routing to Dr. Erin Fulling as an Juluis Rainier.  ?

## 2021-03-30 ENCOUNTER — Ambulatory Visit (HOSPITAL_COMMUNITY): Admission: RE | Admit: 2021-03-30 | Payer: Medicare PPO | Source: Home / Self Care | Admitting: Pulmonary Disease

## 2021-03-30 DIAGNOSIS — R918 Other nonspecific abnormal finding of lung field: Secondary | ICD-10-CM | POA: Insufficient documentation

## 2021-03-30 HISTORY — DX: Anxiety disorder, unspecified: F41.9

## 2021-03-30 HISTORY — DX: Pneumonia, unspecified organism: J18.9

## 2021-03-30 HISTORY — DX: Unspecified osteoarthritis, unspecified site: M19.90

## 2021-03-30 SURGERY — BRONCHOSCOPY, WITH BIOPSY USING ELECTROMAGNETIC NAVIGATION
Anesthesia: General | Laterality: Left

## 2021-04-04 NOTE — Progress Notes (Signed)
?Cardiology Office Note:   ? ?Date:  04/05/2021  ? ?IDKairen [name], DOB 1950/02/12, MRN [id number] ? ?PCP:  [name], MD  ?Cardiologist:  Sinclair Grooms, MD  ? ?Referring MD: [name], MD  ? ?Chief Complaint  ?Patient presents with  ? Coronary Artery Disease  ? Congestive Heart Failure  ? Cardiac Valve Problem  ? Shortness of Breath  ? Follow-up  ?  Preoperative evaluation  ? ? ?History of Present Illness:   ? ?[name] [name] is a 71 y.o. male with a hx of PAD (bilateral kissing balloon stents 2017), CAD, prior history of CABG and percutaneous coronary intervention (most recently 2012), chronic combined systolic and diastolic heart failure (EF at cath 2016 was 40-50%) with EF 35-40% 12/23/2020, hypertension, hyperlipidemia, and type 2 diabetes. ? ?Current HF therapy Carvedilol, max Entresto.  Unable to tolerate SGLT-2 (yeast) and not on MRA. ? ?He has exertional dyspnea.  He denies orthopnea.  He does awaken from sleep coughing and has to bring up phlegm.  For as long as I have known him, he has slept on a wedge.  He has stable angina unchanged from the preceding 2 to 3 years.  He may use 2 nitroglycerin per month.  If he exerts himself above a certain level he gets predictable and can that most of the time is relieved with rest.  There is no peripheral edema.  He denies palpitations.  No lower extremity swelling. ? ?Past Medical History:  ?Diagnosis Date  ? Anxiety   ? Arthritis   ? Chest pain   ? a. experience weekly, nitrate responsive c/p x several years.   b. ranexa added on 08/2014 admission   ? Chronic combined systolic and diastolic CHF (congestive heart failure) ([place])   ? [facility])   ? a. 08/2014 ABI: R 0.79, L 1.0-->Walking program instituted.  ? Coronary artery disease   ? a. s/p MI in 1989;  b. 04/2008 CABG x 5 (LIMA->LAD, VG->D1, VG->RI, VG->PDA, L Radial->OM2);  c. 06/2010 PCI/DES to the Diag (2.25x28 Promus Element DES). VG->Diag occluded;  d. 08/2010 Attempted PTCA of the OM2 via the  free radial graft. Unsuccessful.  Lesion felt to be 2/2 suture.  c. NSTEMI s/p [facility] with severe native and graph dz; D1 that was stented in 2012 now occulded: Rx therapy recommended  ? Coronary artery disease involving native coronary artery of native heart with angina pectoris ([place])   ? a. s/p MI in 1989;  b. 04/2008 CABG x 5 (LIMA->LAD, VG->D1, VG->RI, VG->PDA, L Radial->OM2);  c. 06/2010 PCI/DES to the Diag (2.25x28 Promus Element DES). VG->Diag occluded;  d. 08/2010 Attempted PTCA of the OM2 via the free radial graft. Unsuccessful.  Lesion felt to be 2/2 suture.  c. NSTEMI s/p [facility] with severe native and graph dz; Rx therapy recommended   ? COVID 2021  ? mild case  ? Diabetes mellitus ([place]) 06/02/2011  ? Hyperlipidemia   ? Hypertension   ? NSTEMI (non-ST elevated myocardial infarction) ([place]) 08/29/2014  ? Peripheral arterial disease ([place]) 08/16/2014  ? Pneumonia   ? Type II diabetes mellitus ([place])   ? ? ?Past Surgical History:  ?Procedure Laterality Date  ? CARDIAC CATHETERIZATION N/A 08/28/2014  ? Procedure: Left Heart Cath and Cors/Grafts Angiography;  Surgeon: Belva Crome, MD;  Location: [place] CV LAB;  Service: Cardiovascular;  Laterality: N/A;  ? COLONOSCOPY    ? CORONARY ANGIOPLASTY WITH STENT PLACEMENT    ? CORONARY ARTERY BYPASS GRAFT    ?  LEFT HEART CATH AND CORS/GRAFTS ANGIOGRAPHY N/A 03/20/2018  ? Procedure: LEFT HEART CATH AND CORS/GRAFTS ANGIOGRAPHY;  Surgeon: Martinique, Peter M, MD;  Location: [place] CV [facility];  Service: Cardiovascular;  Laterality: N/A;  ? PERIPHERAL VASCULAR CATHETERIZATION N/A 07/15/2015  ? Procedure: Abdominal Aortogram w/Lower Extremity;  Surgeon: Wellington Hampshire, MD;  Location: [place] [facility] LAB;  Service: Cardiovascular;  Laterality: N/A;  ? PERIPHERAL VASCULAR CATHETERIZATION Bilateral 07/15/2015  ? Procedure: Peripheral Vascular Intervention;  Surgeon: Wellington Hampshire, MD;  Location: [place] [facility];  Service: Cardiovascular;  Laterality: Bilateral;  Common Iliacs  ?  SHOULDER ARTHROSCOPY WITH ROTATOR CUFF REPAIR AND SUBACROMIAL DECOMPRESSION Left 05/23/2013  ? Procedure: LEFT SHOULDER ARTHROSCOPY SUBACROMIAL  DECOMPRESSION DISTAL CLAVICLE RESECTION AND ROTATOR CUFF REPAIR ;  Surgeon: Marin Shutter, MD;  Location: [place];  Service: Orthopedics;  Laterality: Left;  ? SHOULDER SURGERY Right 7-8 years ago  ? Dr. Dorna Leitz  ? ? ?Current Medications: ?Current Meds  ?Medication Sig  ? acetaminophen (TYLENOL) 500 MG tablet Take 500 mg by mouth daily as needed for mild pain.  ? ALPRAZolam (XANAX) 0.25 MG tablet Take 0.25 mg by mouth 3 (three) times daily as needed for anxiety (nerves).  ? aspirin 81 MG tablet Take 81 mg by mouth daily.   ? atorvastatin (LIPITOR) 80 MG tablet Take 1 tablet by mouth once daily (Patient taking differently: Take 80 mg by mouth every evening.)  ? Azelastine HCl 0.15 % SOLN Place 1 spray into both nostrils daily as needed (Allergies).  ? carvedilol (COREG) 6.25 MG tablet TAKE 1 TABLET TWICE DAILY WITH A MEAL  ? cholecalciferol (VITAMIN D) 1000 UNITS tablet Take 1,000 Units by mouth daily.   ? clopidogrel (PLAVIX) 75 MG tablet Take 75 mg by mouth daily with breakfast.  ? ezetimibe (ZETIA) 10 MG tablet TAKE 1 TABLET EVERY DAY  ? fluticasone (FLONASE) 50 MCG/ACT nasal spray Place 2 sprays into the nose daily as needed for allergies or rhinitis (For nasal congestion).  ? furosemide (LASIX) 40 MG tablet Take 1 tablet by mouth once daily  ? isosorbide mononitrate (IMDUR) 30 MG 24 hr tablet Take one tablet by mouth once daily along with your Imdur 60mg  tablet for a total of 90mg   ? isosorbide mononitrate (IMDUR) 60 MG 24 hr tablet Take 1 tablet (60 mg total) by mouth daily.  ? metFORMIN (GLUCOPHAGE) 1000 MG tablet Take 1,000 mg by mouth 2 (two) times daily with a meal.  ? nitroGLYCERIN (NITROSTAT) 0.4 MG SL tablet Place 0.4 mg under the tongue every 5 (five) minutes x 3 doses as needed. For chest pain  ? Polyvinyl Alcohol-Povidone (CLEAR EYES ALL SEASONS OP) Place 1  drop into both eyes daily as needed (for dry eyes).  ? ranolazine (RANEXA) 500 MG 12 hr tablet TAKE 1 TABLET TWICE DAILY  ? sacubitril-valsartan (ENTRESTO) 97-103 MG Take 1 tablet by mouth 2 (two) times daily.  ? Semaglutide,0.25 or 0.5MG /DOS, (OZEMPIC, 0.25 OR 0.5 MG/DOSE,) 2 MG/1.5ML SOPN Inject 0.5 mg into the skin once a week.  ? Tiotropium Bromide-Olodaterol (STIOLTO RESPIMAT) 2.5-2.5 MCG/ACT AERS Inhale 2 puffs into the lungs daily.  ?  ? ?Allergies:   Aldactone [spironolactone], Amlodipine, Farxiga [dapagliflozin], Percocet [oxycodone-acetaminophen], Trulicity [dulaglutide], Avandia [rosiglitazone], and Codeine  ? ?Social History  ? ?Socioeconomic History  ? Marital status: Married  ?  Spouse name: Not on file  ? Number of children: Not on file  ? Years of education: Not on file  ? Highest  education level: Not on file  ?Occupational History  ? Not on file  ?Tobacco Use  ? Smoking status: Former  ?  Types: Cigarettes  ?  Quit date: 01/04/1987  ?  Years since quitting: 34.2  ? Smokeless tobacco: Former  ?Vaping Use  ? Vaping Use: Never used  ?Substance and Sexual Activity  ? Alcohol use: Yes  ?  Alcohol/week: 2.0 standard drinks  ?  Types: 2 Cans of beer per week  ?  Comment: a week  ? Drug use: No  ? Sexual activity: Not on file  ?Other Topics Concern  ? Not on file  ?Social History Narrative  ? Lives in [place] with his wife.  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  ? ?Social Determinants of Health  ? ?Financial Resource Strain: Not on file  ?Food Insecurity: Not on file  ?Transportation Needs: Not on file  ?Physical Activity: Not on file  ?Stress: Not on file  ?Social Connections: Not on file  ?  ? ?Family History: ?The patient's family history includes Anesthesia problems in his brother; Diabetes in his brother; Heart Problems in his brother, brother, and sister; Heart attack in his father; Heart disease in his mother; Heart failure in his mother; Hypertension in his brother; Kidney failure in his brother; Lung  cancer in his mother. ? ?ROS:   ?Please see the history of present illness.    ?No blood in the urine or stool.  All other systems reviewed and are negative. ? ?EKGs/Labs/Other Studies Reviewed:   ? ?The

## 2021-04-05 ENCOUNTER — Encounter: Payer: Self-pay | Admitting: Interventional Cardiology

## 2021-04-05 ENCOUNTER — Ambulatory Visit: Payer: Medicare PPO | Admitting: Interventional Cardiology

## 2021-04-05 VITALS — BP 132/62 | HR 76 | Ht 66.5 in | Wt 151.8 lb

## 2021-04-05 DIAGNOSIS — E7849 Other hyperlipidemia: Secondary | ICD-10-CM

## 2021-04-05 DIAGNOSIS — Z01818 Encounter for other preprocedural examination: Secondary | ICD-10-CM | POA: Diagnosis not present

## 2021-04-05 DIAGNOSIS — Z95820 Peripheral vascular angioplasty status with implants and grafts: Secondary | ICD-10-CM

## 2021-04-05 DIAGNOSIS — Z95828 Presence of other vascular implants and grafts: Secondary | ICD-10-CM

## 2021-04-05 DIAGNOSIS — Z95818 Presence of other cardiac implants and grafts: Secondary | ICD-10-CM | POA: Diagnosis not present

## 2021-04-05 DIAGNOSIS — I739 Peripheral vascular disease, unspecified: Secondary | ICD-10-CM | POA: Diagnosis not present

## 2021-04-05 DIAGNOSIS — R0989 Other specified symptoms and signs involving the circulatory and respiratory systems: Secondary | ICD-10-CM

## 2021-04-05 DIAGNOSIS — I10 Essential (primary) hypertension: Secondary | ICD-10-CM

## 2021-04-05 DIAGNOSIS — Z87891 Personal history of nicotine dependence: Secondary | ICD-10-CM

## 2021-04-05 DIAGNOSIS — I5042 Chronic combined systolic (congestive) and diastolic (congestive) heart failure: Secondary | ICD-10-CM | POA: Diagnosis not present

## 2021-04-05 DIAGNOSIS — I25118 Atherosclerotic heart disease of native coronary artery with other forms of angina pectoris: Secondary | ICD-10-CM

## 2021-04-05 DIAGNOSIS — Z955 Presence of coronary angioplasty implant and graft: Secondary | ICD-10-CM | POA: Diagnosis not present

## 2021-04-05 MED ORDER — ISOSORBIDE MONONITRATE ER 30 MG PO TB24
ORAL_TABLET | ORAL | 3 refills | Status: AC
Start: 2021-04-05 — End: ?

## 2021-04-05 NOTE — Patient Instructions (Signed)
Medication Instructions:  ?1) INCREASE Imdur to 90mg  once daily.  You will need to take one 60mg  and one 30mg  tablet. ? ?*If you need a refill on your cardiac medications before your next appointment, please call your pharmacy* ? ? ?Lab Work: ?None ?If you have labs (blood work) drawn today and your tests are completely normal, you will receive your results only by: ?MyChart Message (if you have MyChart) OR ?A paper copy in the mail ?If you have any lab test that is abnormal or we need to change your treatment, we will call you to review the results. ? ? ?Testing/Procedures: ?None ? ? ?Follow-Up: ?At [facility], you and your health needs are our priority.  As part of our continuing mission to provide you with exceptional heart care, we have created designated Provider Care Teams.  These Care Teams include your primary Cardiologist (physician) and Advanced Practice Providers (APPs -  Physician Assistants and Nurse Practitioners) who all work together to provide you with the care you need, when you need it. ? ?We recommend signing up for the patient portal called "MyChart".  Sign up information is provided on this After Visit Summary.  MyChart is used to connect with patients for Virtual Visits (Telemedicine).  Patients are able to view lab/test results, encounter notes, upcoming appointments, etc.  Non-urgent messages can be sent to your provider as well.   ?To learn more about what you can do with MyChart, go to [email or web address].   ? ?Your next appointment:   ?4 month(s) ? ?The format for your next appointment:   ?In Person ? ?Provider:   ?Sinclair Grooms, MD  ? ? ?Other Instructions ?  ?

## 2021-04-07 DIAGNOSIS — E1151 Type 2 diabetes mellitus with diabetic peripheral angiopathy without gangrene: Secondary | ICD-10-CM | POA: Diagnosis not present

## 2021-04-07 DIAGNOSIS — R9389 Abnormal findings on diagnostic imaging of other specified body structures: Secondary | ICD-10-CM | POA: Diagnosis not present

## 2021-04-20 ENCOUNTER — Encounter: Payer: Self-pay | Admitting: Pulmonary Disease

## 2021-04-20 DIAGNOSIS — R918 Other nonspecific abnormal finding of lung field: Secondary | ICD-10-CM

## 2021-04-21 NOTE — Telephone Encounter (Signed)
-----   Message from Freddi Starr, MD sent at 04/21/2021 12:13 PM EDT ----- ?Regarding: Bronch Procedure ?Michel Santee, ? ?Patient has been cleared by Dr. Tamala Julian and his primary care to move forward with procedure. He is on plavix. If we could get him back in the procedure line up, that would be appreciated.  ? ?Thanks, ?Wille Glaser ? ?

## 2021-04-21 NOTE — Telephone Encounter (Signed)
PCCM: ? ?I have spoke with Dr. Erin Fulling. We need to get [name] [name] on the schedule for procedure. I think he needs a repeat CT Chest. It has been several months.  ? ?I will place orders and options for procedure dates.  ? ?Thanks ? ?[name] Nash, DO ?[facility] ?04/21/2021 1:54 PM   ? ?

## 2021-04-30 ENCOUNTER — Inpatient Hospital Stay: Admission: RE | Admit: 2021-04-30 | Payer: Medicare PPO | Source: Ambulatory Visit

## 2021-04-30 ENCOUNTER — Ambulatory Visit (INDEPENDENT_AMBULATORY_CARE_PROVIDER_SITE_OTHER)
Admission: RE | Admit: 2021-04-30 | Discharge: 2021-04-30 | Disposition: A | Discharge status: Home / Self Care | Payer: Medicare PPO | Source: Ambulatory Visit | Attending: Pulmonary Disease | Admitting: Pulmonary Disease

## 2021-04-30 DIAGNOSIS — R918 Other nonspecific abnormal finding of lung field: Secondary | ICD-10-CM

## 2021-04-30 DIAGNOSIS — J90 Pleural effusion, not elsewhere classified: Secondary | ICD-10-CM | POA: Diagnosis not present

## 2021-04-30 DIAGNOSIS — J439 Emphysema, unspecified: Secondary | ICD-10-CM | POA: Diagnosis not present

## 2021-05-03 ENCOUNTER — Encounter (HOSPITAL_COMMUNITY): Payer: Self-pay | Admitting: Pulmonary Disease

## 2021-05-03 NOTE — Progress Notes (Addendum)
TWO VISITORS ARE ALLOWED TO COME WITH YOU AND STAY IN THE SURGICAL WAITING ROOM ONLY DURING PRE OP AND PROCEDURE DAY OF SURGERY.  ? ?PCP - Dr [name] ?Cardiologist - Dr Daneen Schick ([facility] in [place]) ?Pulmonology - Dr Freda Jackson ? ?Ct Chest x-ray - 04/30/21 ?EKG - 12/01/20 ?Stress Test - 04/2008 ?ECHO - 12/23/20 ? ?Cardiac Cath - 03/20/18 ?ICD Pacemaker/Loop - n/a ? ?Sleep Study -  n/a ?CPAP - none ? ?Do not take Metformin on  the morning of surgery. ? ?If your blood sugar is less than 70 mg/dL, you will need to treat for low blood sugar: ?Treat a low blood sugar (less than 70 mg/dL) with ? cup of clear juice (cranberry or apple), 4 glucose tablets, OR glucose gel. ?Recheck blood sugar in 15 minutes after treatment (to make sure it is greater than 70 mg/dL). If your blood sugar is not greater than 70 mg/dL on recheck, call [phone] for further instructions. ? ?Blood Thinner Instructions:  Follow your surgeon's instructions on when to stop Plavix prior to surgery. Last dose was on 04/28/21. ? ?Aspirin Instructions: Follow your surgeon's instructions on when to stop aspirin prior to surgery,  If no instructions were given by your surgeon then you will need to call the office for those instructions.   ? ?Covid test on DOS ? ?Anesthesia review: Yes ? ?STOP now taking any Aspirin (unless otherwise instructed by your surgeon), Aleve, Naproxen, Ibuprofen, Motrin, Advil, Goody's, BC's, all herbal medications, fish oil, and all vitamins.  ? ?Coronavirus Screening ?Do you have any of the following symptoms:  ?Cough Yes ?Fever (>100.42F)  yes/no: No ?Runny nose yes/no: No ?Sore throat yes/no: No ?Difficulty breathing/shortness of breath  yes/no: No ? ?Have you traveled in the last 14 days and where? yes/no: No ? ?Patient verbalized understanding of instructions that were given via phone. ?

## 2021-05-04 ENCOUNTER — Ambulatory Visit (HOSPITAL_COMMUNITY): Payer: Medicare PPO

## 2021-05-04 ENCOUNTER — Ambulatory Visit (HOSPITAL_COMMUNITY): Payer: Medicare PPO | Admitting: Emergency Medicine

## 2021-05-04 ENCOUNTER — Ambulatory Visit (HOSPITAL_BASED_OUTPATIENT_CLINIC_OR_DEPARTMENT_OTHER): Payer: Medicare PPO | Admitting: Emergency Medicine

## 2021-05-04 ENCOUNTER — Encounter (HOSPITAL_COMMUNITY)
Admission: RE | Disposition: A | Discharge status: Home / Self Care | Payer: Self-pay | Source: Home / Self Care | Attending: Pulmonary Disease

## 2021-05-04 ENCOUNTER — Encounter (HOSPITAL_COMMUNITY): Payer: Self-pay | Admitting: Pulmonary Disease

## 2021-05-04 ENCOUNTER — Ambulatory Visit (HOSPITAL_COMMUNITY)
Admission: RE | Admit: 2021-05-04 | Discharge: 2021-05-04 | Disposition: A | Discharge status: Home / Self Care | Payer: Medicare PPO | Attending: Pulmonary Disease | Admitting: Pulmonary Disease

## 2021-05-04 DIAGNOSIS — Z7984 Long term (current) use of oral hypoglycemic drugs: Secondary | ICD-10-CM | POA: Diagnosis not present

## 2021-05-04 DIAGNOSIS — I509 Heart failure, unspecified: Secondary | ICD-10-CM | POA: Diagnosis not present

## 2021-05-04 DIAGNOSIS — I5042 Chronic combined systolic (congestive) and diastolic (congestive) heart failure: Secondary | ICD-10-CM | POA: Diagnosis not present

## 2021-05-04 DIAGNOSIS — C801 Malignant (primary) neoplasm, unspecified: Secondary | ICD-10-CM | POA: Diagnosis not present

## 2021-05-04 DIAGNOSIS — I252 Old myocardial infarction: Secondary | ICD-10-CM | POA: Diagnosis not present

## 2021-05-04 DIAGNOSIS — R911 Solitary pulmonary nodule: Secondary | ICD-10-CM | POA: Diagnosis not present

## 2021-05-04 DIAGNOSIS — J302 Other seasonal allergic rhinitis: Secondary | ICD-10-CM | POA: Diagnosis not present

## 2021-05-04 DIAGNOSIS — Z79899 Other long term (current) drug therapy: Secondary | ICD-10-CM | POA: Diagnosis not present

## 2021-05-04 DIAGNOSIS — D649 Anemia, unspecified: Secondary | ICD-10-CM | POA: Diagnosis not present

## 2021-05-04 DIAGNOSIS — Z8709 Personal history of other diseases of the respiratory system: Secondary | ICD-10-CM | POA: Diagnosis not present

## 2021-05-04 DIAGNOSIS — Z20822 Contact with and (suspected) exposure to covid-19: Secondary | ICD-10-CM | POA: Insufficient documentation

## 2021-05-04 DIAGNOSIS — Z7902 Long term (current) use of antithrombotics/antiplatelets: Secondary | ICD-10-CM | POA: Diagnosis not present

## 2021-05-04 DIAGNOSIS — I700 Atherosclerosis of aorta: Secondary | ICD-10-CM | POA: Diagnosis not present

## 2021-05-04 DIAGNOSIS — C771 Secondary and unspecified malignant neoplasm of intrathoracic lymph nodes: Secondary | ICD-10-CM | POA: Diagnosis not present

## 2021-05-04 DIAGNOSIS — Z955 Presence of coronary angioplasty implant and graft: Secondary | ICD-10-CM | POA: Insufficient documentation

## 2021-05-04 DIAGNOSIS — K219 Gastro-esophageal reflux disease without esophagitis: Secondary | ICD-10-CM | POA: Diagnosis not present

## 2021-05-04 DIAGNOSIS — I2510 Atherosclerotic heart disease of native coronary artery without angina pectoris: Secondary | ICD-10-CM | POA: Insufficient documentation

## 2021-05-04 DIAGNOSIS — Z951 Presence of aortocoronary bypass graft: Secondary | ICD-10-CM | POA: Diagnosis not present

## 2021-05-04 DIAGNOSIS — C3432 Malignant neoplasm of lower lobe, left bronchus or lung: Secondary | ICD-10-CM | POA: Insufficient documentation

## 2021-05-04 DIAGNOSIS — E1151 Type 2 diabetes mellitus with diabetic peripheral angiopathy without gangrene: Secondary | ICD-10-CM | POA: Insufficient documentation

## 2021-05-04 DIAGNOSIS — D759 Disease of blood and blood-forming organs, unspecified: Secondary | ICD-10-CM | POA: Insufficient documentation

## 2021-05-04 DIAGNOSIS — I110 Hypertensive heart disease with heart failure: Secondary | ICD-10-CM

## 2021-05-04 DIAGNOSIS — R918 Other nonspecific abnormal finding of lung field: Secondary | ICD-10-CM | POA: Diagnosis not present

## 2021-05-04 DIAGNOSIS — C3490 Malignant neoplasm of unspecified part of unspecified bronchus or lung: Secondary | ICD-10-CM | POA: Diagnosis not present

## 2021-05-04 DIAGNOSIS — Z87891 Personal history of nicotine dependence: Secondary | ICD-10-CM | POA: Insufficient documentation

## 2021-05-04 HISTORY — PX: FINE NEEDLE ASPIRATION: SHX5430

## 2021-05-04 HISTORY — PX: BRONCHIAL WASHINGS: SHX5105

## 2021-05-04 HISTORY — PX: BRONCHIAL BIOPSY: SHX5109

## 2021-05-04 HISTORY — PX: BRONCHIAL BRUSHINGS: SHX5108

## 2021-05-04 HISTORY — PX: VIDEO BRONCHOSCOPY WITH ENDOBRONCHIAL ULTRASOUND: SHX6177

## 2021-05-04 LAB — CBC
HCT: 37.3 % — ABNORMAL LOW (ref 39.0–52.0)
Hemoglobin: 12.6 g/dL — ABNORMAL LOW (ref 13.0–17.0)
MCH: 31.3 pg (ref 26.0–34.0)
MCHC: 33.8 g/dL (ref 30.0–36.0)
MCV: 92.8 fL (ref 80.0–100.0)
Platelets: 177 10*3/uL (ref 150–400)
RBC: 4.02 MIL/uL — ABNORMAL LOW (ref 4.22–5.81)
RDW: 14.5 % (ref 11.5–15.5)
WBC: 8.7 10*3/uL (ref 4.0–10.5)
nRBC: 0 % (ref 0.0–0.2)

## 2021-05-04 LAB — BASIC METABOLIC PANEL
Anion gap: 13 (ref 5–15)
BUN: 11 mg/dL (ref 8–23)
CO2: 21 mmol/L — ABNORMAL LOW (ref 22–32)
Calcium: 9.4 mg/dL (ref 8.9–10.3)
Chloride: 96 mmol/L — ABNORMAL LOW (ref 98–111)
Creatinine, Ser: 1 mg/dL (ref 0.61–1.24)
GFR, Estimated: >60 mL/min (ref >60)
Glucose, Bld: 110 mg/dL — ABNORMAL HIGH (ref 70–99)
Potassium: 4.3 mmol/L (ref 3.5–5.1)
Sodium: 130 mmol/L — ABNORMAL LOW (ref 135–145)

## 2021-05-04 LAB — SARS CORONAVIRUS 2 BY RT PCR: SARS Coronavirus 2 by RT PCR: NEGATIVE

## 2021-05-04 LAB — GLUCOSE, CAPILLARY
Glucose-Capillary: 123 mg/dL — ABNORMAL HIGH (ref 70–99)
Glucose-Capillary: 116 mg/dL — ABNORMAL HIGH (ref 70–99)

## 2021-05-04 SURGERY — BRONCHOSCOPY, WITH BIOPSY USING ELECTROMAGNETIC NAVIGATION
Anesthesia: General | Laterality: Left

## 2021-05-04 MED ORDER — EPHEDRINE SULFATE-NACL 50-0.9 MG/10ML-% IV SOSY
PREFILLED_SYRINGE | INTRAVENOUS | Status: DC | PRN
  Administered 2021-05-04: 10 mg via INTRAVENOUS

## 2021-05-04 MED ORDER — PROPOFOL 10 MG/ML IV BOLUS
INTRAVENOUS | Status: DC | PRN
  Administered 2021-05-04: 100 mg via INTRAVENOUS

## 2021-05-04 MED ORDER — PHENYLEPHRINE 80 MCG/ML (10ML) SYRINGE FOR IV PUSH (FOR BLOOD PRESSURE SUPPORT)
PREFILLED_SYRINGE | INTRAVENOUS | Status: DC | PRN
  Administered 2021-05-04 (×2): 80 ug via INTRAVENOUS
  Administered 2021-05-04: 160 ug via INTRAVENOUS
  Administered 2021-05-04: 80 ug via INTRAVENOUS

## 2021-05-04 MED ORDER — LIDOCAINE 2% (20 MG/ML) 5 ML SYRINGE
INTRAMUSCULAR | Status: DC | PRN
  Administered 2021-05-04: 60 mg via INTRAVENOUS

## 2021-05-04 MED ORDER — PHENYLEPHRINE HCL-NACL 20-0.9 MG/250ML-% IV SOLN
INTRAVENOUS | Status: DC | PRN
Start: 2021-05-04 — End: 2021-05-04
  Administered 2021-05-04: 50 ug/min via INTRAVENOUS

## 2021-05-04 MED ORDER — FENTANYL CITRATE (PF) 250 MCG/5ML IJ SOLN
INTRAMUSCULAR | Status: DC | PRN
  Administered 2021-05-04: 50 ug via INTRAVENOUS

## 2021-05-04 MED ORDER — SUGAMMADEX SODIUM 200 MG/2ML IV SOLN
INTRAVENOUS | Status: DC | PRN
  Administered 2021-05-04: 200 mg via INTRAVENOUS

## 2021-05-04 MED ORDER — CHLORHEXIDINE GLUCONATE 0.12 % MT SOLN
15.0000 mL | OROMUCOSAL | Status: AC
  Administered 2021-05-04: 15 mL via OROMUCOSAL
  Filled 2021-05-04: qty 15

## 2021-05-04 MED ORDER — LACTATED RINGERS IV SOLN: INTRAVENOUS | Status: DC

## 2021-05-04 MED ORDER — ONDANSETRON HCL 4 MG/2ML IJ SOLN
INTRAMUSCULAR | Status: DC | PRN
  Administered 2021-05-04: 4 mg via INTRAVENOUS

## 2021-05-04 MED ORDER — ROCURONIUM BROMIDE 10 MG/ML (PF) SYRINGE
PREFILLED_SYRINGE | INTRAVENOUS | Status: DC | PRN
  Administered 2021-05-04: 40 mg via INTRAVENOUS

## 2021-05-04 MED ORDER — DEXAMETHASONE SODIUM PHOSPHATE 10 MG/ML IJ SOLN
INTRAMUSCULAR | Status: DC | PRN
  Administered 2021-05-04: 4 mg via INTRAVENOUS

## 2021-05-04 SURGICAL SUPPLY — 30 items
BRUSH CYTOL CELLEBRITY 1.5X140 (MISCELLANEOUS) IMPLANT
CANISTER SUCT 3000ML PPV (MISCELLANEOUS) ×4 IMPLANT
CONT SPEC 4OZ CLIKSEAL STRL BL (MISCELLANEOUS) ×4 IMPLANT
COVER BACK TABLE 60X90IN (DRAPES) ×4 IMPLANT
COVER DOME SNAP 22 D (MISCELLANEOUS) ×4 IMPLANT
FORCEPS BIOP RJ4 1.8 (CUTTING FORCEPS) IMPLANT
GAUZE SPONGE 4X4 12PLY STRL (GAUZE/BANDAGES/DRESSINGS) ×4 IMPLANT
GLOVE BIO SURGEON STRL SZ7.5 (GLOVE) ×4 IMPLANT
GOWN STRL REUS W/ TWL LRG LVL3 (GOWN DISPOSABLE) ×3 IMPLANT
GOWN STRL REUS W/TWL LRG LVL3 (GOWN DISPOSABLE) ×4
KIT CLEAN ENDO COMPLIANCE (KITS) ×8 IMPLANT
KIT TURNOVER KIT B (KITS) ×4 IMPLANT
MARKER SKIN DUAL TIP RULER LAB (MISCELLANEOUS) ×4 IMPLANT
NDL EBUS SONO TIP PENTAX (NEEDLE) ×3 IMPLANT
NEEDLE EBUS SONO TIP PENTAX (NEEDLE) ×4 IMPLANT
NS IRRIG 1000ML POUR BTL (IV SOLUTION) ×4 IMPLANT
OIL SILICONE PENTAX (PARTS (SERVICE/REPAIRS)) ×4 IMPLANT
PAD ARMBOARD 7.5X6 YLW CONV (MISCELLANEOUS) ×8 IMPLANT
SOL ANTI FOG 6CC (MISCELLANEOUS) ×3 IMPLANT
SOLUTION ANTI FOG 6CC (MISCELLANEOUS) ×1
SYR 20CC LL (SYRINGE) ×8 IMPLANT
SYR 20ML ECCENTRIC (SYRINGE) ×8 IMPLANT
SYR 50ML SLIP (SYRINGE) IMPLANT
SYR 5ML LUER SLIP (SYRINGE) ×4 IMPLANT
TOWEL OR 17X24 6PK STRL BLUE (TOWEL DISPOSABLE) ×4 IMPLANT
TRAP SPECIMEN MUCOUS 40CC (MISCELLANEOUS) IMPLANT
TUBE CONNECTING 20X1/4 (TUBING) ×8 IMPLANT
UNDERPAD 30X30 (UNDERPADS AND DIAPERS) ×4 IMPLANT
VALVE DISPOSABLE (MISCELLANEOUS) ×4 IMPLANT
WATER STERILE IRR 1000ML POUR (IV SOLUTION) ×4 IMPLANT

## 2021-05-04 NOTE — Anesthesia Procedure Notes (Signed)
Procedure Name: Intubation ?Date/Time: 05/04/2021 12:34 PM ?Performed by: Jenne Campus, CRNA ?Pre-anesthesia Checklist: Patient identified, Emergency Drugs available, Suction available and Patient being monitored ?Patient Re-evaluated:Patient Re-evaluated prior to induction ?Oxygen Delivery Method: Circle System Utilized ?Preoxygenation: Pre-oxygenation with 100% oxygen ?Induction Type: IV induction ?Ventilation: Mask ventilation without difficulty ?Laryngoscope Size: Sabra Heck and 3 ?Grade View: Grade I ?Tube type: Oral ?Tube size: 8.5 mm ?Number of attempts: 1 ?Airway Equipment and Method: Stylet and Oral airway ?Placement Confirmation: ETT inserted through vocal cords under direct vision, positive ETCO2 and breath sounds checked- equal and bilateral ?Secured at: 22 cm ?Tube secured with: Tape ?Dental Injury: Teeth and Oropharynx as per pre-operative assessment  ? ? ? ? ?

## 2021-05-04 NOTE — Interval H&P Note (Signed)
History and Physical Interval Note: ? ?05/04/2021 ?11:58 AM ? ?[name] [name]  has presented today for surgery, with the diagnosis of lung mass.  The various methods of treatment have been discussed with the patient and family. After consideration of risks, benefits and other options for treatment, the patient has consented to  Procedure(s) with comments: ?ROBOTIC ASSISTED NAVIGATIONAL BRONCHOSCOPY (Left) - ION w/ CIOS ?VIDEO BRONCHOSCOPY WITH ENDOBRONCHIAL ULTRASOUND (Bilateral) as a surgical intervention.  The patient's history has been reviewed, patient examined, no change in status, stable for surgery.  I have reviewed the patient's chart and labs.  Questions were answered to the patient's satisfaction.   ? ? ?[name] [name] ? ? ?

## 2021-05-04 NOTE — Transfer of Care (Signed)
Immediate Anesthesia Transfer of Care Note ? ?Patient: [name] [name] ? ?Procedure(s) Performed: ROBOTIC ASSISTED NAVIGATIONAL BRONCHOSCOPY (Left) ?VIDEO BRONCHOSCOPY WITH ENDOBRONCHIAL ULTRASOUND (Bilateral) ?BRONCHIAL BIOPSIES ?BRONCHIAL BRUSHINGS ?BRONCHIAL WASHINGS ?FINE NEEDLE ASPIRATION (FNA) LINEAR ? ?Patient Location: PACU ? ?Anesthesia Type:General ? ?Level of Consciousness: awake, oriented and patient cooperative ? ?Airway & Oxygen Therapy: Patient Spontanous Breathing and Patient connected to nasal cannula oxygen ? ?Post-op Assessment: Report given to RN and Post -op Vital signs reviewed and stable ? ?Post vital signs: Reviewed ? ?Last Vitals:  ?Vitals Value Taken Time  ?BP 137/79 05/04/21 1340  ?Temp    ?Pulse 67 05/04/21 1341  ?Resp 15 05/04/21 1341  ?SpO2 98 % 05/04/21 1341  ?Vitals shown include unvalidated device data. ? ?Last Pain:  ?Vitals:  ? 05/04/21 1009  ?TempSrc: Oral  ?PainSc:   ?   ? ?Patients Stated Pain Goal: 0 (05/04/21 1005) ? ?Complications: No notable events documented. ?

## 2021-05-04 NOTE — Op Note (Signed)
Video Bronchoscopy with Robotic Assisted Bronchoscopic Navigation  ?Video Bronchoscopy with Endobronchial Ultrasound Procedure Note ? ?Date of Operation: 05/04/2021  ? ?Pre-op Diagnosis: Lung mass, infiltrate ? ?Post-op Diagnosis: Lung mass, infiltrate  ? ?Surgeon: Garner Nash, DO ? ?Assistants: None  ? ?Anesthesia: General endotracheal anesthesia ? ?Operation: Flexible video fiberoptic bronchoscopy with robotic assistance and biopsies. ? ?Estimated Blood Loss: Minimal ? ?Complications: None ? ?Indications and History: ?[name] [name] is a 71 y.o. male with history of left lower lobe infiltrate. The risks, benefits, complications, treatment options and expected outcomes were discussed with the patient.  The possibilities of pneumothorax, pneumonia, reaction to medication, pulmonary aspiration, perforation of a viscus, bleeding, failure to diagnose a condition and creating a complication requiring transfusion or operation were discussed with the patient who freely signed the consent.   ? ?Description of Procedure: ?The patient was seen in the Preoperative Area, was examined and was deemed appropriate to proceed.  The patient was taken to [facility] endoscopy room 3, identified as [name] [name] and the procedure verified as Flexible Video Fiberoptic Bronchoscopy.  A Time Out was held and the above information confirmed.  ? ?Prior to the date of the procedure a high-resolution CT scan of the chest was performed. Utilizing ION software program a virtual tracheobronchial tree was generated to allow the creation of distinct navigation pathways to the patient's parenchymal abnormalities. After being taken to the operating room general anesthesia was initiated and the patient  was orally intubated. The video fiberoptic bronchoscope was introduced via the endotracheal tube and a general inspection was performed which showed normal right and left lung anatomy, aspiration of the bilateral mainstems was completed to remove any remaining  secretions. Robotic catheter inserted into patient's endotracheal tube.  ? ?Target #1 left lower lobe infiltrate: ?The distinct navigation pathways prepared prior to this procedure were then utilized to navigate to patient's lesion identified on CT scan. The robotic catheter was secured into place and the vision probe was withdrawn.  Lesion location was approximated using fluoroscopy and radial endobronchial ultrasound for peripheral targeting. Under fluoroscopic guidance transbronchial needle brushings and transbronchial forceps biopsies were performed to be sent for cytology and pathology. A bronchioalveolar lavage was performed in the left lower lobe and sent for cytology.  Following removal of the robotic catheter there was a significant amount of clotted blood in the left mainstem and bleeding over into the right lung.  Using the therapeutic bronchoscope it was necessary to complete aspiration of the bilateral mainstem and removal of the clots within the left lung.  There was no visible active bleeding after completion of this.  We then turned our attention to the Olympus ultrasound portion of the procedure. ? ?Target #2 station 7 subcarinal node: ?The standard scope was then withdrawn and the endobronchial ultrasound was used to identify and characterize the peritracheal, hilar and bronchial lymph nodes. Inspection showed no significant adenopathy in the hilum, enlarged greater than 1 cm lymph node in the subcarinal space. Using real-time ultrasound guidance Wang needle biopsies were take from Station 7 nodes and were sent for cytology. The patient tolerated the procedure well without apparent complications. There was no significant blood loss. The bronchoscope was withdrawn. Anesthesia was reversed and the patient was taken to the PACU for recovery.  ? ?At the end of the procedure a general airway inspection was performed and there was no evidence of active bleeding. The bronchoscope was removed.  The patient  tolerated the procedure well. There was no significant blood  loss and there were no obvious complications. A post-procedural chest x-ray is pending. ? ?Samples Target #1: ?1. Transbronchial needle brushings from left lower lobe ?2. Transbronchial forceps biopsies from left lower lobe ?3. Bronchoalveolar lavage from left lower lobe ? ?Samples Target #2: ?1. Wang needle biopsies from Station 7 node ? ?Plans:  ?The patient will be discharged from the PACU to home when recovered from anesthesia and after chest x-ray is reviewed. We will review the cytology, pathology and microbiology results with the patient when they become available. Outpatient followup will be with Garner Nash, DO. ? ? ?Garner Nash, DO ?[facility] ?05/04/2021 1:44 PM   ?   ?

## 2021-05-04 NOTE — Discharge Instructions (Signed)
Flexible Bronchoscopy, Care After ?This sheet gives you information about how to care for yourself after your test. Your doctor may also give you more specific instructions. If you have problems or questions, contact your doctor. ?Follow these instructions at home: ?Eating and drinking ?Do not eat or drink anything (not even water) for 2 hours after your test, or until your numbing medicine (local anesthetic) wears off. ?When your numbness is gone and your cough and gag reflexes have come back, you may: ?Eat only soft foods. ?Slowly drink liquids. ?The day after the test, go back to your normal diet. ?Driving ?Do not drive for 24 hours if you were given a medicine to help you relax (sedative). ?Do not drive or use heavy machinery while taking prescription pain medicine. ?General instructions ? ?Take over-the-counter and prescription medicines only as told by your doctor. ?Return to your normal activities as told. Ask what activities are safe for you. ?Do not use any products that have nicotine or tobacco in them. This includes cigarettes and e-cigarettes. If you need help quitting, ask your doctor. ?Keep all follow-up visits as told by your doctor. This is important. It is very important if you had a tissue sample (biopsy) taken. ?Get help right away if: ?You have shortness of breath that gets worse. ?You get light-headed. ?You feel like you are going to pass out (faint). ?You have chest pain. ?You cough up: ?More than a little blood. ?More blood than before. ?Summary ?Do not eat or drink anything (not even water) for 2 hours after your test, or until your numbing medicine wears off. ?Do not use cigarettes. Do not use e-cigarettes. ?Get help right away if you have chest pain. ? ?This information is not intended to replace advice given to you by your health care provider. Make sure you discuss any questions you have with your health care provider. ?Document Released: 10/17/2008 Document Revised: 12/02/2016 Document  Reviewed: 01/08/2016 ?Elsevier Patient Education ? [place]. ? ?

## 2021-05-04 NOTE — H&P (Signed)
? ? ?Synopsis: Referred in May 2023 for lung mass by No ref. provider found ? ?Subjective:  ? ?PATIENT ID: [name] [name] GENDER: male DOB: July 08, 1950, MRN: [id number] ? ?No chief complaint on file. ? ? ?[name] [name] is a 71 year old male, former smoker with coronary artery disease, hypertension, hyperlipidemia and DM II who is referred to pulmonary clinic for abnormal chest imaging.  ?  ?Patient reports developing cough and chest congestion last fall with a chest radiograph 10/20/2020 showing a left lower lobe airspace opacity concerning for pneumonia.  He was treated with a round of antibiotics with initial improvement but return of his symptoms once regimen completed.  Repeat chest x-ray 11/11/2020 showed some improvement in the left lower lobe opacity.  He continued to have ongoing symptoms and repeat chest x-ray 11/29/2020 showed bibasilar infiltrates and he was treated with another round of antibiotics.  Follow-up chest x-ray 12/16/2020 showed persistent opacities of the bilateral lower lobes, greatest on the left.  He had a third round of antibiotics and a follow-up chest x-ray 01/08/2021 which again showed persistent left lower lobe opacity.  CT chest scan 02/05/2021 shows evidence of centrilobular emphysema.  There is airspace disease throughout a majority of the left lower lobe posterior segment and is dense/masslike along the subpleural area with possible areas of necrosis. ?  ?Patient has significant history of gastroesophageal reflux disease.  Despite using a wedge pillow for the last 5 to 8 years and acid suppression medications he continues to have nighttime awakenings due to reflux.  He denies any dysphagia. ?  ?He denies any skin rashes or joint pains.  He has exertional shortness of breath which she feels is improving since he first noticed it last fall.  His generalized weakness is also improving.  He has wheezing with the shortness of breath.  He also has significant spring and fall allergies.  He uses  Flonase and Claritin for his symptoms. ?  ?He is a former smoker and quit in 1989.  He has a 40+ pack year smoking history as he smoked for 25 years and was smoking 2 packs/day of majority of those years.  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 ? ?05/04/2021: Here today for planned outpatient bronchoscopy.  Patient received cardiac clearance with the okay for stopping his Plavix.  He stopped his Plavix last week.  Plans for bronchoscopy today.  Recent CT chest completed which shows persistent infiltrate within the lower lobe concerning for malignancy. ? ? ?Past Medical History:  ?Diagnosis Date  ? Anxiety   ? Arthritis   ? Chest pain   ? a. experience weekly, nitrate responsive c/p x several years.   b. ranexa added on 08/2014 admission   ? Chronic combined systolic and diastolic CHF (congestive heart failure) ([place])   ? Claudication [facility])   ? a. 08/2014 ABI: R 0.79, L 1.0-->Walking program instituted.  ? Coronary artery disease   ? a. s/p MI in 1989;  b. 04/2008 CABG x 5 (LIMA->LAD, VG->D1, VG->RI, VG->PDA, L Radial->OM2);  c. 06/2010 PCI/DES to the Diag (2.25x28 Promus Element DES). VG->Diag occluded;  d. 08/2010 Attempted PTCA of the OM2 via the free radial graft. Unsuccessful.  Lesion felt to be 2/2 suture.  c. NSTEMI s/p [facility] with severe native and graph dz; D1 that was stented in 2012 now occulded: Rx therapy recommended  ? Coronary artery disease involving native coronary artery of native heart with angina pectoris ([place])   ? a. s/p MI  in 1989;  b. 04/2008 CABG x 5 (LIMA->LAD, VG->D1, VG->RI, VG->PDA, L Radial->OM2);  c. 06/2010 PCI/DES to the Diag (2.25x28 Promus Element DES). VG->Diag occluded;  d. 08/2010 Attempted PTCA of the OM2 via the free radial graft. Unsuccessful.  Lesion felt to be 2/2 suture.  c. NSTEMI s/p [facility] with severe native and graph dz; Rx therapy recommended   ? COVID 2021  ? mild case  ? Diabetes mellitus ([place]) 06/02/2011  ? type 2  ? Hyperlipidemia   ? Hypertension   ? NSTEMI (non-ST  elevated myocardial infarction) ([place]) 08/29/2014  ? Peripheral arterial disease ([place]) 08/16/2014  ? Pneumonia   ? Type II diabetes mellitus ([place])   ?  ? ?Family History  ?Problem Relation Age of Onset  ? Heart Problems Brother   ?     heart valve replacement  ? Anesthesia problems Brother   ? Heart disease Mother   ? Lung cancer Mother   ? Heart failure Mother   ? Heart attack Father   ? Heart Problems Sister   ?     had heart valve replacement  ? Hypertension Brother   ? Diabetes Brother   ? Kidney failure Brother   ? Heart Problems Brother   ?  ? ?Past Surgical History:  ?Procedure Laterality Date  ? CARDIAC CATHETERIZATION N/A 08/28/2014  ? Procedure: Left Heart Cath and Cors/Grafts Angiography;  Surgeon: Belva Crome, MD;  Location: [place] [facility];  Service: Cardiovascular;  Laterality: N/A;  ? COLONOSCOPY    ? CORONARY ANGIOPLASTY WITH STENT PLACEMENT    ? CORONARY ARTERY BYPASS GRAFT    ? LEFT HEART CATH AND CORS/GRAFTS ANGIOGRAPHY N/A 03/20/2018  ? Procedure: LEFT HEART CATH AND CORS/GRAFTS ANGIOGRAPHY;  Surgeon: Martinique, Peter M, MD;  Location: [place] [facility];  Service: Cardiovascular;  Laterality: N/A;  ? PERIPHERAL VASCULAR CATHETERIZATION N/A 07/15/2015  ? Procedure: Abdominal Aortogram w/Lower Extremity;  Surgeon: Wellington Hampshire, MD;  Location: [place] [facility];  Service: Cardiovascular;  Laterality: N/A;  ? PERIPHERAL VASCULAR CATHETERIZATION Bilateral 07/15/2015  ? Procedure: Peripheral Vascular Intervention;  Surgeon: Wellington Hampshire, MD;  Location: [place] [facility];  Service: Cardiovascular;  Laterality: Bilateral;  Common Iliacs  ? SHOULDER ARTHROSCOPY WITH ROTATOR CUFF REPAIR AND SUBACROMIAL DECOMPRESSION Left 05/23/2013  ? Procedure: LEFT SHOULDER ARTHROSCOPY SUBACROMIAL  DECOMPRESSION DISTAL CLAVICLE RESECTION AND ROTATOR CUFF REPAIR ;  Surgeon: Marin Shutter, MD;  Location: [place];  Service: Orthopedics;  Laterality: Left;  ? SHOULDER SURGERY Right 7-8 years ago  ? Dr. Dorna Leitz   ? ? ?Social History  ? ?Socioeconomic History  ? Marital status: Married  ?  Spouse name: Not on file  ? Number of children: Not on file  ? Years of education: Not on file  ? Highest education level: Not on file  ?Occupational History  ? Not on file  ?Tobacco Use  ? Smoking status: Former  ?  Types: Cigarettes  ?  Quit date: 01/04/1987  ?  Years since quitting: 34.3  ? Smokeless tobacco: Former  ?Vaping Use  ? Vaping Use: Never used  ?Substance and Sexual Activity  ? Alcohol use: Not Currently  ?  Comment: 1-2 beers /month  ? Drug use: No  ? Sexual activity: Not Currently  ?Other Topics Concern  ? Not on file  ?Social History Narrative  ? Lives in [place] with his wife.  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  ? ?Social Determinants of Health  ? ?  Financial Resource Strain: Not on file  ?Food Insecurity: Not on file  ?Transportation Needs: Not on file  ?Physical Activity: Not on file  ?Stress: Not on file  ?Social Connections: Not on file  ?Intimate Partner Violence: Not on file  ?  ? ?Allergies  ?Allergen Reactions  ? Aldactone [Spironolactone] Nausea And Vomiting  ? Amlodipine Swelling  ?  Edema   ? [name] [Dapagliflozin]   ?  Yeast infections  ? Percocet [Oxycodone-Acetaminophen] Other (See Comments)  ?  "nightmares, stomach problems, and foggy brain"  ? Trulicity [Dulaglutide] Nausea And Vomiting  ? Avandia [Rosiglitazone] Other (See Comments)  ?  Weight gain  ? Codeine Itching, Swelling, Palpitations and Other (See Comments)  ?  Heart flutter, dizziness  ?  ? ?@ENCMEDSTART @ ? ?Review of Systems  ?Constitutional:  Negative for chills, fever, malaise/fatigue and weight loss.  ?HENT:  Negative for hearing loss, sore throat and tinnitus.   ?Eyes:  Negative for blurred vision and double vision.  ?Respiratory:  Negative for cough, hemoptysis, sputum production, shortness of breath, wheezing and stridor.   ?Cardiovascular:  Negative for chest pain, palpitations, orthopnea, leg swelling and PND.  ?Gastrointestinal:   Negative for abdominal pain, constipation, diarrhea, heartburn, nausea and vomiting.  ?Genitourinary:  Negative for dysuria, hematuria and urgency.  ?Musculoskeletal:  Negative for joint pain and myalgias.  ?Skin:

## 2021-05-04 NOTE — Anesthesia Preprocedure Evaluation (Addendum)
Anesthesia Evaluation  ?Patient identified by MRN, date of birth, ID band ?Patient awake ? ? ? ?Reviewed: ?Allergy & Precautions, NPO status , Patient's Chart, lab work & pertinent test results, reviewed documented beta blocker date and time  ? ?History of Anesthesia Complications ?Negative for: history of anesthetic complications ? ?Airway ?Mallampati: II ? ?TM Distance: >3 FB ?Neck ROM: Full ? ? ? Dental ? ?(+) Missing, Dental Advisory Given,  ?  ?Pulmonary ?former smoker,  ?Mass of lower lobe of left lung ?  ?Pulmonary exam normal ? ? ? ? ? ? ? Cardiovascular ?hypertension, Pt. on medications and Pt. on home beta blockers ?+ CAD, + Past MI, + Cardiac Stents, + CABG (2010), + Peripheral Vascular Disease and +CHF  ?Normal cardiovascular exam ? ?TTE 12/2020: EF 35 to 40%, global hypokinesis, mild LAE, moderate MR, mild to moderate AS ?  ?Neuro/Psych ?Anxiety negative neurological ROS ?   ? GI/Hepatic ?negative GI ROS, Neg liver ROS,   ?Endo/Other  ?diabetes, Type 2, Oral Hypoglycemic Agents ? Renal/GU ?negative Renal ROS  ?negative genitourinary ?  ?Musculoskeletal ? ?(+) Arthritis ,  ? Abdominal ?  ?Peds ? Hematology ? ?(+) Blood dyscrasia (Hgb 12.6), anemia ,   ?Anesthesia Other Findings ?Day of surgery medications reviewed with patient. ? Reproductive/Obstetrics ?negative OB ROS ? ?  ? ? ? ? ? ? ? ? ? ? ? ? ? ?  ?  ? ? ? ? ? ? ? ?Anesthesia Physical ?Anesthesia Plan ? ?ASA: 3 ? ?Anesthesia Plan: General  ? ?Post-op Pain Management: Minimal or no pain anticipated  ? ?Induction: Intravenous ? ?PONV Risk Score and Plan: 2 and Treatment may vary due to age or medical condition, Ondansetron and Dexamethasone ? ?Airway Management Planned: Oral ETT ? ?Additional Equipment: None ? ?Intra-op Plan:  ? ?Post-operative Plan: Extubation in OR ? ?Informed Consent: I have reviewed the patients History and Physical, chart, labs and discussed the procedure including the risks, benefits and  alternatives for the proposed anesthesia with the patient or authorized representative who has indicated his/her understanding and acceptance.  ? ? ? ?Dental advisory given ? ?Plan Discussed with: CRNA ? ?Anesthesia Plan Comments:   ? ? ? ? ? ? ?Anesthesia Quick Evaluation ? ?

## 2021-05-05 ENCOUNTER — Encounter (HOSPITAL_COMMUNITY): Payer: Self-pay | Admitting: Pulmonary Disease

## 2021-05-05 ENCOUNTER — Telehealth: Payer: Self-pay | Admitting: Internal Medicine

## 2021-05-05 NOTE — Telephone Encounter (Signed)
Scheduled appt per 5/3 staff msg from [employer]. Pt is aware of appt date and time. Pt is aware to arrive 15 mins prior to appt time and to bring and updated insurance card. Pt is aware of appt location.   ?

## 2021-05-05 NOTE — Anesthesia Postprocedure Evaluation (Signed)
Anesthesia Post Note ? ?Patient: [name] [name] ? ?Procedure(s) Performed: ROBOTIC ASSISTED NAVIGATIONAL BRONCHOSCOPY (Left) ?VIDEO BRONCHOSCOPY WITH ENDOBRONCHIAL ULTRASOUND (Bilateral) ?BRONCHIAL BIOPSIES ?BRONCHIAL BRUSHINGS ?BRONCHIAL WASHINGS ?FINE NEEDLE ASPIRATION (FNA) LINEAR ? ?  ? ?Patient location during evaluation: PACU ?Anesthesia Type: General ?Level of consciousness: awake and alert ?Pain management: pain level controlled ?Vital Signs Assessment: post-procedure vital signs reviewed and stable ?Respiratory status: spontaneous breathing, nonlabored ventilation, respiratory function stable and patient connected to nasal cannula oxygen ?Cardiovascular status: blood pressure returned to baseline and stable ?Postop Assessment: no apparent nausea or vomiting ?Anesthetic complications: no ? ? ?No notable events documented. ? ?Last Vitals:  ?Vitals:  ? 05/04/21 1357 05/04/21 1412  ?BP: 129/76 126/66  ?Pulse: 66 64  ?Resp: 15 20  ?Temp:  36.7 ?C  ?SpO2: 98% 94%  ?  ?Last Pain:  ?Vitals:  ? 05/04/21 1412  ?TempSrc:   ?PainSc: 0-No pain  ? ? ?  ?  ?  ?  ?  ?  ? ?Suzette Battiest E ? ? ? ? ?

## 2021-05-06 LAB — CYTOLOGY - NON PAP

## 2021-05-10 DIAGNOSIS — C3490 Malignant neoplasm of unspecified part of unspecified bronchus or lung: Secondary | ICD-10-CM | POA: Diagnosis not present

## 2021-05-12 ENCOUNTER — Ambulatory Visit: Payer: Medicare PPO | Admitting: Acute Care

## 2021-05-12 ENCOUNTER — Encounter: Payer: Self-pay | Admitting: Acute Care

## 2021-05-12 VITALS — BP 130/72 | HR 61 | Temp 97.6°F | Ht 66.0 in | Wt 154.4 lb

## 2021-05-12 DIAGNOSIS — C3432 Malignant neoplasm of lower lobe, left bronchus or lung: Secondary | ICD-10-CM

## 2021-05-12 DIAGNOSIS — K219 Gastro-esophageal reflux disease without esophagitis: Secondary | ICD-10-CM | POA: Diagnosis not present

## 2021-05-12 DIAGNOSIS — R0609 Other forms of dyspnea: Secondary | ICD-10-CM | POA: Diagnosis not present

## 2021-05-12 MED ORDER — STIOLTO RESPIMAT 2.5-2.5 MCG/ACT IN AERS: 2.0000 | INHALATION_SPRAY | Freq: Every day | RESPIRATORY_TRACT | 0 refills | Status: DC

## 2021-05-12 NOTE — Progress Notes (Signed)
? ?History of Present Illness ?[name] [name] is a 71 y.o. male with 71 year old male, former smoker with coronary artery disease, hypertension, hyperlipidemia and DM II who is referred to pulmonary clinic for abnormal chest imaging. He was seen as consult by by Dr. Erin Fulling. Dr. Valeta Harms did Flexible video fiberoptic bronchoscopy with robotic assistance and biopsies on 05/04/2021.  ?Results were positive for adenocarcinoma in station 7 node and LLL mass.  ? ? ?Patient reports developing cough and chest congestion last fall with a chest radiograph 10/20/2020 showing a left lower lobe airspace opacity concerning for pneumonia.  He was treated with a round of antibiotics with initial improvement but return of his symptoms once regimen completed.  Repeat chest x-ray 11/11/2020 showed some improvement in the left lower lobe opacity.  He continued to have ongoing symptoms and repeat chest x-ray 11/29/2020 showed bibasilar infiltrates and he was treated with another round of antibiotics.  Follow-up chest x-ray 12/16/2020 showed persistent opacities of the bilateral lower lobes, greatest on the left.  He had a third round of antibiotics and a follow-up chest x-ray 01/08/2021 which again showed persistent left lower lobe opacity.  CT chest scan 02/05/2021 shows evidence of centrilobular emphysema.  There is airspace disease throughout a majority of the left lower lobe posterior segment and is dense/masslike along the subpleural area with possible areas of necrosis. ? ?Patient has significant history of gastroesophageal reflux disease.  Despite using a wedge pillow for the last 5 to 8 years and acid suppression medications he continues to have nighttime awakenings due to reflux.  He denies any dysphagia. ?  ?He denies any skin rashes or joint pains.  He has exertional shortness of breath which she feels is improving since he first noticed it last fall.  His generalized weakness is also improving.  He has wheezing with the shortness of  breath.  He also has significant spring and fall allergies.  He uses Flonase and Claritin for his symptoms. ?  ?He is a former smoker and quit in 1989.  He has a 40+ pack year smoking history as he smoked for 25 years and was smoking 2 packs/day of majority of those years.  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 ?  ?05/04/2021: Pt. Had outpatient bronchoscopy with biopsies. He was off his plavix for a week. . ?  ? ? ?05/12/2021 ?Pt. Presents for follow up.He had Flexible video fiberoptic bronchoscopy with robotic assistance and biopsies on 05/04/2021 per Dr. Valeta Harms.  He states he has done well after his procedure. He did have a sore throat for 4 days. But this self resolved. He did not have any hemoptysis. He has resumed his Plavix 1 day after his procedure. He has had no bleeding since resuming this medication. His Biopsies were + for adenocarcinoma in station 7 lymph node and LLL brushing and biopsy. He has consult with Dr. Julien Nordmann in medical oncology on 5/16. He understands Dr. Julien Nordmann will discuss staging and give the patient options for treatment.  ?He is scheduled for PFT's with Dr. Erin Fulling, and follow up at that time also. He uses Stialto 2 puffs twice daily. He is compliant with this. He has been having trouble staying asleep all night long. He coughs up clear to milk colored secretions that are thin. He does have reflux, but is not on any medication for this. We will do a therapeutic trial of Protonix and see if this helps with the night time awakening. ?Pt. States breathing  is good, but lungs are junky. Sats when he checks are 95 and above.  ?He denies fever, chest pain, orthopnea or hemoptysis.  ? ? ?Test Results: ?Cytology 05/04/2021 ( Bronch) ?D. LYMPH NODE, STATION 7, FINE NEEDLE ASPIRATION:  ?Malignant cells are  present with features suspicious of  ?adenocarcinoma.  ? ?FINAL MICROSCOPIC DIAGNOSIS:  ?- Malignant cells consistent with adenocarcinoma  ?Nodular aggregates of benign lymphocytes are  also present.  ? ?FINAL MICROSCOPIC DIAGNOSIS:  ?A. LUNG, LLL, BRUSH:  ?Malignant cells are present with features suspicious of adenocarcinoma.  ? ?B. LUNG, LLL, BIOPSIES:  ?Adenocarcinoma with mucinous and nonmucinous component.  ?This most likely represents well-differentiated invasive  ?adenocarcinoma primary in the lung ? ?PET Scan 04/30/2021 ?Worsening masslike consolidation and ground-glass in the left ?lower lobe, worrisome for malignancy. Patient is scheduled for ?bronchoscopy 05/03/2021. ?2. Small left pleural effusion, increased. ?3. Suspect mild basilar interstitial lung disease. If further ?evaluation is desired, high-resolution chest CT without contrast ?could be performed. ?4. Cholelithiasis. ?5.  Aortic atherosclerosis (ICD10-I70.0). ?6. Enlarged pulmonic trunk, indicative of pulmonary arterial ?hypertension. ? ? ?Adenocarcinoma with mucinous and nonmucinous component.  ?Please see comment.  ? ? ?  Latest Ref Rng & Units 05/04/2021  ? 10:56 AM 03/24/2021  ? 10:25 AM 11/29/2020  ?  2:41 AM  ?CBC  ?WBC 4.0 - 10.5 K/uL 8.7   9.3   10.2    ?Hemoglobin 13.0 - 17.0 g/dL 12.6   10.0   11.9    ?Hematocrit 39.0 - 52.0 % 37.3   30.0   35.9    ?Platelets 150 - 400 K/uL 177   306   158    ? ? ? ?  Latest Ref Rng & Units 05/04/2021  ? 10:56 AM 03/24/2021  ? 10:25 AM 12/11/2020  ? 11:50 AM  ?BMP  ?Glucose 70 - 99 mg/dL 110   155   219    ?BUN 8 - 23 mg/dL 11   13   16     ?Creatinine 0.61 - 1.24 mg/dL 1.00   1.15   1.19    ?BUN/Creat Ratio 10 - 24  11   13     ?Sodium 135 - 145 mmol/L 130   127   129    ?Potassium 3.5 - 5.1 mmol/L 4.3   4.5   4.7    ?Chloride 98 - 111 mmol/L 96   93   94    ?CO2 22 - 32 mmol/L 21   20   20     ?Calcium 8.9 - 10.3 mg/dL 9.4   8.7   9.5    ? ? ?BNP ?   ?Component Value Date/Time  ? BNP 1,510.4 (H) 11/29/2020 0241  ? BNP 122.8 (H) 11/05/2015 1318  ? ? ?ProBNP ?   ?Component Value Date/Time  ? PROBNP 4,275 (H) 03/24/2021 1025  ? PROBNP 215.7 (H) 08/04/2010 0138  ? ? ?PFT ?No results found for:  FEV1PRE, FEV1POST, FVCPRE, FVCPOST, TLC, DLCOUNC, PREFEV1FVCRT, PSTFEV1FVCRT ? ?DG Chest Port 1 View ? ?Result Date: 05/04/2021 ?CLINICAL DATA:  Status post bronchoscopy with biopsy. Left lower lobe masslike condition. EXAM: PORTABLE CHEST 1 VIEW COMPARISON:  Multiple exams, including chest CT 04/30/2021 and chest radiograph 01/08/2021 FINDINGS: Chronic left lower lobe airspace opacity as on prior exams. Accounting for the tubes projecting over the left lung apex, no pneumothorax is identified. Prior CABG. Mild peripheral interstitial accentuation favoring the lung bases. Heart size within normal limits for the PA technique. Atherosclerotic calcification of the aortic  arch. Calcific tendinopathy in the right rotator cuff. IMPRESSION: 1. No pneumothorax or complicating features status post bronchoscopy. 2. Continued airspace opacity in the left lower lobe. 3.  Aortic Atherosclerosis (ICD10-I70.0). 4. Prior CABG. 5. Calcific right rotator cuff tendinopathy. Electronically Signed   By: Van Clines M.D.   On: 05/04/2021 13:59  ? ?CT Super D Chest Wo Contrast ? ?Result Date: 04/30/2021 ?CLINICAL DATA:  Left lower lobe masslike consolidation, bronchoscopy scheduled for 05/03/2021. EXAM: CT CHEST WITHOUT CONTRAST TECHNIQUE: Multidetector CT imaging of the chest was performed using thin slice collimation for electromagnetic bronchoscopy planning purposes, without intravenous contrast. RADIATION DOSE REDUCTION: This exam was performed according to the departmental dose-optimization program which includes automated exposure control, adjustment of the mA and/or kV according to patient size and/or use of iterative reconstruction technique. COMPARISON:  02/05/2021 and 03/20/2018. FINDINGS: Cardiovascular: Atherosclerotic calcification of the aorta and aortic valve. Pulmonic trunk is enlarged. Heart is at the upper limits of normal in size. No pericardial effusion. Mediastinum/Nodes: Mediastinal lymph nodes are not enlarged  by CT size criteria. Calcified subcarinal and right hilar lymph nodes. Hilar regions are difficult to evaluate without IV contrast. No axillary adenopathy. Esophagus is grossly unremarkable. Lungs/Pleura: Ce

## 2021-05-12 NOTE — Patient Instructions (Addendum)
It is good to see you today ?We have reviewed your biopsy results. ?We have referred you to see Dr. Julien Nordmann on 05/18/2021 ?You have follow up with PFT's on 5/30 with Dr. Erin Fulling ?We will give you samples of Stialto today. ?Have your pharmacist check on the cost of Anoro and Bevespi to see if either of these medications are less expensive on your medication  ?Follow up with Dr. Erin Fulling as is noted above. ?Please try Protonix ( Pantoprazole)  for your reflux. This may help with your night time awakening.  ?Please contact office for sooner follow up if symptoms do not improve or worsen or seek emergency care   ? ?

## 2021-05-17 ENCOUNTER — Other Ambulatory Visit: Payer: Self-pay | Admitting: Internal Medicine

## 2021-05-17 DIAGNOSIS — R918 Other nonspecific abnormal finding of lung field: Secondary | ICD-10-CM

## 2021-05-18 ENCOUNTER — Inpatient Hospital Stay: Payer: Medicare PPO | Attending: Internal Medicine | Admitting: Internal Medicine

## 2021-05-18 ENCOUNTER — Inpatient Hospital Stay: Payer: Medicare PPO

## 2021-05-18 ENCOUNTER — Other Ambulatory Visit: Payer: Self-pay

## 2021-05-18 VITALS — BP 131/65 | HR 73 | Temp 96.7°F | Resp 18 | Wt 151.2 lb

## 2021-05-18 DIAGNOSIS — I5089 Other heart failure: Secondary | ICD-10-CM

## 2021-05-18 DIAGNOSIS — I10 Essential (primary) hypertension: Secondary | ICD-10-CM

## 2021-05-18 DIAGNOSIS — Z87891 Personal history of nicotine dependence: Secondary | ICD-10-CM

## 2021-05-18 DIAGNOSIS — C3492 Malignant neoplasm of unspecified part of left bronchus or lung: Secondary | ICD-10-CM

## 2021-05-18 DIAGNOSIS — E119 Type 2 diabetes mellitus without complications: Secondary | ICD-10-CM | POA: Diagnosis not present

## 2021-05-18 DIAGNOSIS — C3432 Malignant neoplasm of lower lobe, left bronchus or lung: Secondary | ICD-10-CM | POA: Diagnosis not present

## 2021-05-18 DIAGNOSIS — I252 Old myocardial infarction: Secondary | ICD-10-CM

## 2021-05-18 DIAGNOSIS — R918 Other nonspecific abnormal finding of lung field: Secondary | ICD-10-CM

## 2021-05-18 DIAGNOSIS — C3490 Malignant neoplasm of unspecified part of unspecified bronchus or lung: Secondary | ICD-10-CM

## 2021-05-18 LAB — CBC WITH DIFFERENTIAL (CANCER CENTER ONLY)
Abs Immature Granulocytes: 0.03 10*3/uL (ref 0.00–0.07)
Basophils Absolute: 0 10*3/uL (ref 0.0–0.1)
Basophils Relative: 1 %
Eosinophils Absolute: 0.2 10*3/uL (ref 0.0–0.5)
Eosinophils Relative: 2 %
HCT: 35.2 % — ABNORMAL LOW (ref 39.0–52.0)
Hemoglobin: 12.2 g/dL — ABNORMAL LOW (ref 13.0–17.0)
Immature Granulocytes: 0 %
Lymphocytes Relative: 24 %
Lymphs Abs: 2 10*3/uL (ref 0.7–4.0)
MCH: 31.3 pg (ref 26.0–34.0)
MCHC: 34.7 g/dL (ref 30.0–36.0)
MCV: 90.3 fL (ref 80.0–100.0)
Monocytes Absolute: 0.6 10*3/uL (ref 0.1–1.0)
Monocytes Relative: 7 %
Neutro Abs: 5.5 10*3/uL (ref 1.7–7.7)
Neutrophils Relative %: 66 %
Platelet Count: 189 10*3/uL (ref 150–400)
RBC: 3.9 MIL/uL — ABNORMAL LOW (ref 4.22–5.81)
RDW: 14.6 % (ref 11.5–15.5)
WBC Count: 8.3 10*3/uL (ref 4.0–10.5)
nRBC: 0 % (ref 0.0–0.2)

## 2021-05-18 LAB — CMP (CANCER CENTER ONLY)
ALT: 11 U/L (ref 0–44)
AST: 12 U/L — ABNORMAL LOW (ref 15–41)
Albumin: 3.8 g/dL (ref 3.5–5.0)
Alkaline Phosphatase: 85 U/L (ref 38–126)
Anion gap: 6 (ref 5–15)
BUN: 12 mg/dL (ref 8–23)
CO2: 26 mmol/L (ref 22–32)
Calcium: 9.4 mg/dL (ref 8.9–10.3)
Chloride: 96 mmol/L — ABNORMAL LOW (ref 98–111)
Creatinine: 1.1 mg/dL (ref 0.61–1.24)
GFR, Estimated: >60 mL/min (ref >60)
Glucose, Bld: 165 mg/dL — ABNORMAL HIGH (ref 70–99)
Potassium: 4.8 mmol/L (ref 3.5–5.1)
Sodium: 128 mmol/L — ABNORMAL LOW (ref 135–145)
Total Bilirubin: 1.1 mg/dL (ref 0.3–1.2)
Total Protein: 7.1 g/dL (ref 6.5–8.1)

## 2021-05-18 NOTE — Progress Notes (Signed)
? ? [name] [name] ?[phone]   [phone] ? ?CONSULT NOTE ? ?REFERRING PHYSICIAN: Dr. Leory Plowman Icard ? ?REASON FOR CONSULTATION:  ?71 years old white male recently diagnosed with lung cancer ? ?HPI ?[name] [name] is a 71 y.o. male with past medical history significant for hypertension, diabetes mellitus, coronary artery disease status post myocardial infarction, congestive heart failure, anxiety, dyslipidemia, peripheral arterial disease as well as pneumonia and history for smoking but quit 40 years ago.  The patient mentioned that since September 2022 he has been complaining of respiratory symptoms including cough shortness of breath and wheezing.  He was seen at one of the urgent care center and was given treatment with steroids and antibiotics with initial improvement of his symptoms but he has worsening of his condition later on.  He was seen by his primary care physician and eventually on February 05, 2021 had a CT scan of the chest that showed airspace disease throughout much of the posterior left lower lobe some of this airspace disease is more dense and masslike towards the lung base and posterior subpleural lung.  There was no obvious major airway occlusion or evidence of discrete pulmonary abscess.  Bronchoscopy was recommended.  The patient was referred to Dr. Valeta Harms and on 04/30/2021 he had CT super D of the chest and that showed worsening of the masslike consolidation and groundglass in the left lower lobe worrisome for malignancy.  There was also increased small left pleural effusion.  On May 04, 2021 the patient underwent video bronchoscopy with robotic assisted bronchoscopic navigation and EBUS under the care of Dr. Valeta Harms.  The final pathology ([id number]) from the left lower lobe brushing biopsies as well as station 7 lymph node were all consistent with malignant cells, adenocarcinoma with mucinous and nonmucinous component.  This most likely represent  well-differentiated invasive adenocarcinoma of lung primary. The following immunostains are performed on cell block showed CK7: Positive in neoplastic cells. CDX-2: Negative in neoplastic cells. TTF-1: Focally positive. NAPSIN-A: Focally positive. Ki-67: Low proliferative index. ?Dr. Valeta Harms kindly referred the patient to me today for evaluation and recommendation regarding treatment of his condition. ?When seen today he continues to complain of increasing fatigue and weakness.  He also has cough and shortness of breath more at nighttime and he has to use the recliner at times for sleeping.  He currently on Ozempic for diabetes and few days after the injection he would have some episodes of abdominal cramps and lack of appetite as well as occasional nausea vomiting and diarrhea but not persistent.  The patient denied having any headache or visual changes.  He lost around 15 pounds in the last year. ?Family history significant for father with heart disease and kidney cancer.  Mother had heart disease and pneumonia. ?The patient is married and has 5 children, 7 grandchildren and 7 great-grandchildren.  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  He has a history of smoking more than 1 pack/day for around 15-20 years but quit at age 12.  He drinks alcohol occasionally and no history of drug abuse. ? ? ?HPI ? ?Past Medical History:  ?Diagnosis Date  ? Anxiety   ? Arthritis   ? Chest pain   ? a. experience weekly, nitrate responsive c/p x several years.   b. ranexa added on 08/2014 admission   ? Chronic combined systolic and diastolic CHF (congestive heart failure) ([place])   ? Claudication [facility])   ?  a. 08/2014 ABI: R 0.79, L 1.0-->Walking program instituted.  ? Coronary artery disease   ? a. s/p MI in 1989;  b. 04/2008 CABG x 5 (LIMA->LAD, VG->D1, VG->RI, VG->PDA, L Radial->OM2);  c. 06/2010 PCI/DES to the Diag (2.25x28 Promus Element DES). VG->Diag occluded;  d. 08/2010  Attempted PTCA of the OM2 via the free radial graft. Unsuccessful.  Lesion felt to be 2/2 suture.  c. NSTEMI s/p [facility] with severe native and graph dz; D1 that was stented in 2012 now occulded: Rx therapy recommended  ? Coronary artery disease involving native coronary artery of native heart with angina pectoris ([place])   ? a. s/p MI in 1989;  b. 04/2008 CABG x 5 (LIMA->LAD, VG->D1, VG->RI, VG->PDA, L Radial->OM2);  c. 06/2010 PCI/DES to the Diag (2.25x28 Promus Element DES). VG->Diag occluded;  d. 08/2010 Attempted PTCA of the OM2 via the free radial graft. Unsuccessful.  Lesion felt to be 2/2 suture.  c. NSTEMI s/p [facility] with severe native and graph dz; Rx therapy recommended   ? COVID 2021  ? mild case  ? Diabetes mellitus ([place]) 06/02/2011  ? type 2  ? Hyperlipidemia   ? Hypertension   ? NSTEMI (non-ST elevated myocardial infarction) ([place]) 08/29/2014  ? Peripheral arterial disease ([place]) 08/16/2014  ? Pneumonia   ? Type II diabetes mellitus ([place])   ? ? ?Past Surgical History:  ?Procedure Laterality Date  ? BRONCHIAL BIOPSY  05/04/2021  ? Procedure: BRONCHIAL BIOPSIES;  Surgeon: Garner Nash, DO;  Location: [facility];  Service: Pulmonary;;  ? BRONCHIAL BRUSHINGS  05/04/2021  ? Procedure: BRONCHIAL BRUSHINGS;  Surgeon: Garner Nash, DO;  Location: [facility];  Service: Pulmonary;;  ? BRONCHIAL WASHINGS  05/04/2021  ? Procedure: BRONCHIAL WASHINGS;  Surgeon: Garner Nash, DO;  Location: [place] [facility];  Service: Pulmonary;;  ? CARDIAC CATHETERIZATION N/A 08/28/2014  ? Procedure: Left Heart Cath and Cors/Grafts Angiography;  Surgeon: Belva Crome, MD;  Location: [place] [facility];  Service: Cardiovascular;  Laterality: N/A;  ? COLONOSCOPY    ? CORONARY ANGIOPLASTY WITH STENT PLACEMENT    ? CORONARY ARTERY BYPASS GRAFT    ? FINE NEEDLE ASPIRATION  05/04/2021  ? Procedure: FINE NEEDLE ASPIRATION (FNA) LINEAR;  Surgeon: Garner Nash, DO;  Location: [place];  Service: Pulmonary;;  ? LEFT HEART CATH AND CORS/GRAFTS  ANGIOGRAPHY N/A 03/20/2018  ? Procedure: LEFT HEART CATH AND CORS/GRAFTS ANGIOGRAPHY;  Surgeon: Martinique, Peter M, MD;  Location: [place] [facility];  Service: Cardiovascular;  Laterality: N/A;  ? PERIPHERAL VASCULAR CATHETERIZATION N/A 07/15/2015  ? Procedure: Abdominal Aortogram w/Lower Extremity;  Surgeon: Wellington Hampshire, MD;  Location: [place] [facility];  Service: Cardiovascular;  Laterality: N/A;  ? PERIPHERAL VASCULAR CATHETERIZATION Bilateral 07/15/2015  ? Procedure: Peripheral Vascular Intervention;  Surgeon: Wellington Hampshire, MD;  Location: [place] [facility];  Service: Cardiovascular;  Laterality: Bilateral;  Common Iliacs  ? SHOULDER ARTHROSCOPY WITH ROTATOR CUFF REPAIR AND SUBACROMIAL DECOMPRESSION Left 05/23/2013  ? Procedure: LEFT SHOULDER ARTHROSCOPY SUBACROMIAL  DECOMPRESSION DISTAL CLAVICLE RESECTION AND ROTATOR CUFF REPAIR ;  Surgeon: Marin Shutter, MD;  Location: [place];  Service: Orthopedics;  Laterality: Left;  ? SHOULDER SURGERY Right 7-8 years ago  ? Dr. Dorna Leitz  ? VIDEO BRONCHOSCOPY WITH ENDOBRONCHIAL ULTRASOUND Bilateral 05/04/2021  ? Procedure: VIDEO BRONCHOSCOPY WITH ENDOBRONCHIAL ULTRASOUND;  Surgeon: Garner Nash, DO;  Location: [place];  Service: Pulmonary;  Laterality: Bilateral;  ? ? ?Family History  ?Problem Relation Age  of Onset  ? Heart Problems Brother   ?     heart valve replacement  ? Anesthesia problems Brother   ? Heart disease Mother   ? Lung cancer Mother   ? Heart failure Mother   ? Heart attack Father   ? Heart Problems Sister   ?     had heart valve replacement  ? Hypertension Brother   ? Diabetes Brother   ? Kidney failure Brother   ? Heart Problems Brother   ? ? ?Social History ?Social History  ? ?Tobacco Use  ? Smoking status: Former  ?  Types: Cigarettes  ?  Quit date: 01/04/1987  ?  Years since quitting: 34.3  ? Smokeless tobacco: Former  ?Vaping Use  ? Vaping Use: Never used  ?Substance Use Topics  ? Alcohol use: Not Currently  ?  Comment: 1-2 beers /month  ?  Drug use: No  ? ? ?Allergies  ?Allergen Reactions  ? Aldactone [Spironolactone] Nausea And Vomiting  ? Amlodipine Swelling  ?  Edema   ? [name] [Dapagliflozin]   ?  Yeast infections  ? Percocet [Oxycodone-Acetaminophe

## 2021-05-21 DIAGNOSIS — C3490 Malignant neoplasm of unspecified part of unspecified bronchus or lung: Secondary | ICD-10-CM | POA: Diagnosis not present

## 2021-05-24 DIAGNOSIS — H40033 Anatomical narrow angle, bilateral: Secondary | ICD-10-CM | POA: Diagnosis not present

## 2021-05-24 DIAGNOSIS — E119 Type 2 diabetes mellitus without complications: Secondary | ICD-10-CM | POA: Diagnosis not present

## 2021-05-25 ENCOUNTER — Ambulatory Visit: Payer: Medicare PPO | Admitting: Interventional Cardiology

## 2021-05-27 ENCOUNTER — Ambulatory Visit (HOSPITAL_COMMUNITY)
Admission: RE | Admit: 2021-05-27 | Discharge: 2021-05-27 | Disposition: A | Discharge status: Home / Self Care | Payer: Medicare PPO | Source: Ambulatory Visit | Attending: Internal Medicine | Admitting: Internal Medicine

## 2021-05-27 DIAGNOSIS — J438 Other emphysema: Secondary | ICD-10-CM | POA: Diagnosis not present

## 2021-05-27 DIAGNOSIS — J432 Centrilobular emphysema: Secondary | ICD-10-CM | POA: Insufficient documentation

## 2021-05-27 DIAGNOSIS — I6523 Occlusion and stenosis of bilateral carotid arteries: Secondary | ICD-10-CM | POA: Insufficient documentation

## 2021-05-27 DIAGNOSIS — I700 Atherosclerosis of aorta: Secondary | ICD-10-CM | POA: Insufficient documentation

## 2021-05-27 DIAGNOSIS — C3432 Malignant neoplasm of lower lobe, left bronchus or lung: Secondary | ICD-10-CM | POA: Insufficient documentation

## 2021-05-27 DIAGNOSIS — K8020 Calculus of gallbladder without cholecystitis without obstruction: Secondary | ICD-10-CM | POA: Insufficient documentation

## 2021-05-27 DIAGNOSIS — C3490 Malignant neoplasm of unspecified part of unspecified bronchus or lung: Secondary | ICD-10-CM | POA: Diagnosis present

## 2021-05-27 DIAGNOSIS — J90 Pleural effusion, not elsewhere classified: Secondary | ICD-10-CM | POA: Diagnosis not present

## 2021-05-27 LAB — GLUCOSE, CAPILLARY: Glucose-Capillary: 132 mg/dL — ABNORMAL HIGH (ref 70–99)

## 2021-05-27 MED ORDER — FLUDEOXYGLUCOSE F - 18 (FDG) INJECTION
7.5000 | Freq: Once | INTRAVENOUS | Status: AC
  Administered 2021-05-27: 7.5 via INTRAVENOUS

## 2021-05-29 ENCOUNTER — Ambulatory Visit (HOSPITAL_COMMUNITY)
Admission: RE | Admit: 2021-05-29 | Discharge: 2021-05-29 | Disposition: A | Discharge status: Home / Self Care | Payer: Medicare PPO | Source: Ambulatory Visit | Attending: Internal Medicine | Admitting: Internal Medicine

## 2021-05-29 DIAGNOSIS — C3490 Malignant neoplasm of unspecified part of unspecified bronchus or lung: Secondary | ICD-10-CM | POA: Insufficient documentation

## 2021-05-29 DIAGNOSIS — I6782 Cerebral ischemia: Secondary | ICD-10-CM | POA: Diagnosis not present

## 2021-05-29 MED ORDER — GADOBUTROL 1 MMOL/ML IV SOLN
7.0000 mL | Freq: Once | INTRAVENOUS | Status: AC | PRN
  Administered 2021-05-29: 7 mL via INTRAVENOUS

## 2021-06-01 ENCOUNTER — Ambulatory Visit: Payer: Medicare PPO | Admitting: Pulmonary Disease

## 2021-06-01 ENCOUNTER — Ambulatory Visit (INDEPENDENT_AMBULATORY_CARE_PROVIDER_SITE_OTHER): Payer: Medicare PPO | Admitting: Pulmonary Disease

## 2021-06-01 ENCOUNTER — Encounter: Payer: Self-pay | Admitting: Pulmonary Disease

## 2021-06-01 VITALS — BP 126/72 | HR 80 | Ht 65.5 in | Wt 151.6 lb

## 2021-06-01 DIAGNOSIS — J432 Centrilobular emphysema: Secondary | ICD-10-CM

## 2021-06-01 DIAGNOSIS — C3492 Malignant neoplasm of unspecified part of left bronchus or lung: Secondary | ICD-10-CM

## 2021-06-01 LAB — PULMONARY FUNCTION TEST
DL/VA % pred: 85 %
DL/VA: 3.48 ml/min/mmHg/L
DLCO cor % pred: 79 %
DLCO cor: 17.6 ml/min/mmHg
DLCO unc % pred: 73 %
DLCO unc: 16.28 ml/min/mmHg
FEF 25-75 Post: 1.84 L/sec
FEF 25-75 Pre: 1.7 L/sec
FEF2575-%Change-Post: 8 %
FEF2575-%Pred-Post: 96 %
FEF2575-%Pred-Pre: 88 %
FEV1-%Change-Post: 2 %
FEV1-%Pred-Post: 85 %
FEV1-%Pred-Pre: 83 %
FEV1-Post: 2.21 L
FEV1-Pre: 2.17 L
FEV1FVC-%Change-Post: 0 %
FEV1FVC-%Pred-Pre: 102 %
FEV6-%Change-Post: 3 %
FEV6-%Pred-Post: 88 %
FEV6-%Pred-Pre: 85 %
FEV6-Post: 2.95 L
FEV6-Pre: 2.87 L
FEV6FVC-%Change-Post: 0 %
FEV6FVC-%Pred-Post: 106 %
FEV6FVC-%Pred-Pre: 107 %
FVC-%Change-Post: 2 %
FVC-%Pred-Post: 82 %
FVC-%Pred-Pre: 80 %
FVC-Post: 2.97 L
FVC-Pre: 2.89 L
Post FEV1/FVC ratio: 75 %
Post FEV6/FVC ratio: 100 %
Pre FEV1/FVC ratio: 75 %
Pre FEV6/FVC Ratio: 100 %
RV % pred: 54 %
RV: 1.23 L
TLC % pred: 135 %
TLC: 8.27 L

## 2021-06-01 MED ORDER — TRELEGY ELLIPTA 100-62.5-25 MCG/ACT IN AEPB: 1.0000 | INHALATION_SPRAY | Freq: Every day | RESPIRATORY_TRACT | 0 refills | Status: DC

## 2021-06-01 NOTE — Progress Notes (Signed)
Patient seen in the office today and instructed on use of Trelegy 157mcg.  Patient expressed understanding and demonstrated technique.  Benetta Spar [facility] 06/01/2021

## 2021-06-01 NOTE — Patient Instructions (Addendum)
Stop the stiolto while trying Trelegy Ellipta   Use Trelegy Ellipta 1 puff daily - rinse mouth out after each use  Your breathing tests show mild diffusion defect, meaning oxygen is having a harder time traveling from the air sacs into the blood stream.   Follow up in 6 months

## 2021-06-01 NOTE — Progress Notes (Signed)
Full PFT Performed Today  

## 2021-06-01 NOTE — Patient Instructions (Signed)
Full PFT Performed Today  

## 2021-06-01 NOTE — Progress Notes (Signed)
Synopsis: Referred in February 2023 for left lower lobe consolidation by [name], MD  Subjective:   PATIENT ID: [name] [name] GENDER: male DOB: 1950/10/07, MRN: [id number]  HPI  Chief Complaint  Patient presents with   Follow-up    3 mo f/u after PFT. He has been coughing more at night.    [name] [name] is a 71 year old male, former smoker with coronary artery disease, hypertension, hyperlipidemia, DM II and stage IIIb adenocarcinoma of the lung diagnosed 05/2021 who returns to pulmonary clinic for follow up of emphysema.   He had navigational bronchoscopy by Dr. Valeta Harms on 05/04/21 after cardiac evaluation. Left lower lobe mass and station 7 lymph nodes were positive for adenocarcinoma. MRI brain is negative. PET scan is consistent with findings from nav bronch/ebus. He met with Dr. Julien Nordmann on 05/18/21.   He reports his breathing has improved with the stiolto. He denies any weight loss and no hemoptysis.  Initial OV 03/02/21 Patient reports developing cough and chest congestion last fall with a chest radiograph 10/20/2020 showing a left lower lobe airspace opacity concerning for pneumonia.  He was treated with a round of antibiotics with initial improvement but return of his symptoms once regimen completed.  Repeat chest x-ray 11/11/2020 showed some improvement in the left lower lobe opacity.  He continued to have ongoing symptoms and repeat chest x-ray 11/29/2020 showed bibasilar infiltrates and he was treated with another round of antibiotics.  Follow-up chest x-ray 12/16/2020 showed persistent opacities of the bilateral lower lobes, greatest on the left.  He had a third round of antibiotics and a follow-up chest x-ray 01/08/2021 which again showed persistent left lower lobe opacity.  CT chest scan 02/05/2021 shows evidence of centrilobular emphysema.  There is airspace disease throughout a majority of the left lower lobe posterior segment and is dense/masslike along the subpleural area with possible  areas of necrosis.  Patient has significant history of gastroesophageal reflux disease.  Despite using a wedge pillow for the last 5 to 8 years and acid suppression medications he continues to have nighttime awakenings due to reflux.  He denies any dysphagia.  He denies any skin rashes or joint pains.  He has exertional shortness of breath which she feels is improving since he first noticed it last fall.  His generalized weakness is also improving.  He has wheezing with the shortness of breath.  He also has significant spring and fall allergies.  He uses Flonase and Claritin for his symptoms.  He is a former smoker and quit in 1989.  He has a 40+ pack year smoking history as he smoked for 25 years and was smoking 2 packs/day of majority of those years.  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  Past Medical History:  Diagnosis Date   Anxiety    Arthritis    Chest pain    a. experience weekly, nitrate responsive c/p x several years.   b. ranexa added on 08/2014 admission    Chronic combined systolic and diastolic CHF (congestive heart failure) ([place])    Claudication ([place])    a. 08/2014 ABI: R 0.79, L 1.0-->Walking program instituted.   Coronary artery disease    a. s/p MI in 1989;  b. 04/2008 CABG x 5 (LIMA->LAD, VG->D1, VG->RI, VG->PDA, L Radial->OM2);  c. 06/2010 PCI/DES to the Diag (2.25x28 Promus Element DES). VG->Diag occluded;  d. 08/2010 Attempted PTCA of the OM2 via the free radial graft. Unsuccessful.  Lesion felt to be 2/2  suture.  c. NSTEMI s/p [facility] with severe native and graph dz; D1 that was stented in 2012 now occulded: Rx therapy recommended   Coronary artery disease involving native coronary artery of native heart with angina pectoris ([place])    a. s/p MI in 1989;  b. 04/2008 CABG x 5 (LIMA->LAD, VG->D1, VG->RI, VG->PDA, L Radial->OM2);  c. 06/2010 PCI/DES to the Diag (2.25x28 Promus Element DES). VG->Diag occluded;  d. 08/2010 Attempted PTCA of the OM2 via the free radial graft.  Unsuccessful.  Lesion felt to be 2/2 suture.  c. NSTEMI s/p [facility] with severe native and graph dz; Rx therapy recommended    COVID 2021   mild case   Diabetes mellitus ([place]) 06/02/2011   type 2   Hyperlipidemia    Hypertension    NSTEMI (non-ST elevated myocardial infarction) ([place]) 08/29/2014   Peripheral arterial disease ([place]) 08/16/2014   Pneumonia    Type II diabetes mellitus ([place])      Family History  Problem Relation Age of Onset   Heart Problems Brother        heart valve replacement   Anesthesia problems Brother    Heart disease Mother    Lung cancer Mother    Heart failure Mother    Heart attack Father    Heart Problems Sister        had heart valve replacement   Hypertension Brother    Diabetes Brother    Kidney failure Brother    Heart Problems Brother      Social History   Socioeconomic History   Marital status: Married    Spouse name: Not on file   Number of children: Not on file   Years of education: Not on file   Highest education level: Not on file  Occupational History   Not on file  Tobacco Use   Smoking status: Former    Types: Cigarettes    Quit date: 01/04/1987    Years since quitting: 34.4   Smokeless tobacco: Former  Scientific laboratory technician Use: Never used  Substance and Sexual Activity   Alcohol use: Not Currently    Comment: 1-2 beers /month   Drug use: No   Sexual activity: Not Currently  Other Topics Concern   Not on file  Social History Narrative   Lives in [place] with his wife.  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   Social Determinants of Health   Financial Resource Strain: Not on file  Food Insecurity: Not on file  Transportation Needs: Not on file  Physical Activity: Not on file  Stress: Not on file  Social Connections: Not on file  Intimate Partner Violence: Not on file     Allergies  Allergen Reactions   Aldactone [Spironolactone] Nausea And Vomiting   Amlodipine Swelling    Edema    Farxiga [Dapagliflozin]     Yeast  infections   Percocet [Oxycodone-Acetaminophen] Other (See Comments)    "nightmares, stomach problems, and foggy brain"   Trulicity [Dulaglutide] Nausea And Vomiting   Avandia [Rosiglitazone] Other (See Comments)    Weight gain   Codeine Itching, Swelling, Palpitations and Other (See Comments)    Heart flutter, dizziness     Outpatient Medications Prior to Visit  Medication Sig Dispense Refill   acetaminophen (TYLENOL) 500 MG tablet Take 500 mg by mouth every 8 (eight) hours as needed for mild pain.     ALPRAZolam (XANAX) 0.25 MG tablet Take 0.25 mg by mouth 3 (three) times daily  as needed for anxiety (nerves).     aspirin 81 MG tablet Take 81 mg by mouth daily.      atorvastatin (LIPITOR) 80 MG tablet Take 1 tablet by mouth once daily (Patient taking differently: Take 80 mg by mouth every evening.) 90 tablet 3   Azelastine HCl 0.15 % SOLN Place 1 spray into both nostrils daily as needed (Allergies).     carvedilol (COREG) 6.25 MG tablet TAKE 1 TABLET TWICE DAILY WITH A MEAL 180 tablet 3   cholecalciferol (VITAMIN D) 1000 UNITS tablet Take 1,000 Units by mouth daily.      clopidogrel (PLAVIX) 75 MG tablet Take 75 mg by mouth daily with breakfast.     ezetimibe (ZETIA) 10 MG tablet TAKE 1 TABLET EVERY DAY 90 tablet 3   fluticasone (FLONASE) 50 MCG/ACT nasal spray Place 2 sprays into the nose daily as needed for allergies or rhinitis (For nasal congestion).     furosemide (LASIX) 40 MG tablet Take 1 tablet by mouth once daily 90 tablet 3   isosorbide mononitrate (IMDUR) 30 MG 24 hr tablet Take one tablet by mouth once daily along with your Imdur 34m tablet for a total of 932m90 tablet 3   isosorbide mononitrate (IMDUR) 60 MG 24 hr tablet Take 1 tablet (60 mg total) by mouth daily. 90 tablet 3   metFORMIN (GLUCOPHAGE) 1000 MG tablet Take 1,000 mg by mouth 2 (two) times daily with a meal.     nitroGLYCERIN (NITROSTAT) 0.4 MG SL tablet Place 0.4 mg under the tongue every 5 (five) minutes x 3  doses as needed. For chest pain     Polyvinyl Alcohol-Povidone (CLEAR EYES ALL SEASONS OP) Place 1 drop into both eyes daily as needed (for dry eyes).     ranolazine (RANEXA) 500 MG 12 hr tablet TAKE 1 TABLET TWICE DAILY 180 tablet 2   sacubitril-valsartan (ENTRESTO) 97-103 MG Take 1 tablet by mouth 2 (two) times daily. 60 tablet 11   Semaglutide,0.25 or 0.5MG/DOS, (OZEMPIC, 0.25 OR 0.5 MG/DOSE,) 2 MG/1.5ML SOPN Inject 0.5 mg into the skin once a week. Takes on Wednesday     Tiotropium Bromide-Olodaterol (STIOLTO RESPIMAT) 2.5-2.5 MCG/ACT AERS Inhale 2 puffs into the lungs daily. (Patient taking differently: Inhale 1 puff into the lungs daily.) 8 g 5   Tiotropium Bromide-Olodaterol (STIOLTO RESPIMAT) 2.5-2.5 MCG/ACT AERS Inhale 2 puffs into the lungs daily. 4 g 0   No facility-administered medications prior to visit.    Review of Systems  Constitutional:  Negative for chills, fever, malaise/fatigue and weight loss.  HENT:  Positive for congestion. Negative for sinus pain and sore throat.   Eyes: Negative.   Respiratory:  Positive for cough. Negative for hemoptysis, sputum production, shortness of breath and wheezing.   Cardiovascular:  Negative for chest pain, palpitations, orthopnea, claudication and leg swelling.  Gastrointestinal:  Positive for heartburn. Negative for abdominal pain, nausea and vomiting.  Genitourinary: Negative.   Musculoskeletal:  Negative for joint pain and myalgias.  Skin:  Negative for rash.  Neurological:  Negative for weakness.  Endo/Heme/Allergies: Negative.   Psychiatric/Behavioral: Negative.       Objective:   Vitals:   06/01/21 1103  BP: 126/72  Pulse: 80  SpO2: 98%  Weight: 151 lb 9.6 oz (68.8 kg)  Height: 5' 5.5" (1.664 m)   Physical Exam Constitutional:      General: He is not in acute distress. HENT:     Head: Normocephalic and atraumatic.  Eyes:  Conjunctiva/sclera: Conjunctivae normal.  Cardiovascular:     Rate and Rhythm: Normal  rate and regular rhythm.     Pulses: Normal pulses.     Heart sounds: Normal heart sounds. No murmur heard. Pulmonary:     Effort: Pulmonary effort is normal.     Breath sounds: Rales (left base) present. No wheezing or rhonchi.  Musculoskeletal:     Right lower leg: No edema.     Left lower leg: No edema.  Skin:    General: Skin is warm and dry.  Neurological:     General: No focal deficit present.     Mental Status: He is alert.  Psychiatric:        Mood and Affect: Mood normal.        Behavior: Behavior normal.        Thought Content: Thought content normal.        Judgment: Judgment normal.   CBC    Component Value Date/Time   WBC 8.3 05/18/2021 1103   WBC 8.7 05/04/2021 1056   RBC 3.90 (L) 05/18/2021 1103   HGB 12.2 (L) 05/18/2021 1103   HGB 10.0 (L) 03/24/2021 1025   HCT 35.2 (L) 05/18/2021 1103   HCT 30.0 (L) 03/24/2021 1025   PLT 189 05/18/2021 1103   PLT 306 03/24/2021 1025   MCV 90.3 05/18/2021 1103   MCV 89 03/24/2021 1025   MCH 31.3 05/18/2021 1103   MCHC 34.7 05/18/2021 1103   RDW 14.6 05/18/2021 1103   RDW 13.2 03/24/2021 1025   LYMPHSABS 2.0 05/18/2021 1103   MONOABS 0.6 05/18/2021 1103   EOSABS 0.2 05/18/2021 1103   BASOSABS 0.0 05/18/2021 1103      Latest Ref Rng & Units 05/18/2021   11:03 AM 05/04/2021   10:56 AM 03/24/2021   10:25 AM  BMP  Glucose 70 - 99 mg/dL 165   110   155    BUN 8 - 23 mg/dL '12   11   13    ' Creatinine 0.61 - 1.24 mg/dL 1.10   1.00   1.15    BUN/Creat Ratio 10 - 24   11    Sodium 135 - 145 mmol/L 128   130   127    Potassium 3.5 - 5.1 mmol/L 4.8   4.3   4.5    Chloride 98 - 111 mmol/L 96   96   93    CO2 22 - 32 mmol/L '26   21   20    ' Calcium 8.9 - 10.3 mg/dL 9.4   9.4   8.7     Chest imaging: CT Chest w/ contrast 02/05/21 Emphysema with posterior left lower lobe airspace disease. Given persistence by chest x-ray over at least a 2 month time frame and some appearance suggestive of more mass-like consolidation in  the posterior and basilar aspect of the left lower lobe with areas of potential necrosis, there would be some concern of potential underlying neoplasm. Correlation with bronchoscopy may therefore be helpful.   PFT:    Latest Ref Rng & Units 06/01/2021    9:42 AM  PFT Results  FVC-Pre L 2.89  P  FVC-Predicted Pre % 80  P  FVC-Post L 2.97  P  FVC-Predicted Post % 82  P  Pre FEV1/FVC % % 75  P  Post FEV1/FCV % % 75  P  FEV1-Pre L 2.17  P  FEV1-Predicted Pre % 83  P  FEV1-Post L 2.21  P  DLCO uncorrected ml/min/mmHg  16.28  P  DLCO UNC% % 73  P  DLCO corrected ml/min/mmHg 17.60  P  DLCO COR %Predicted % 79  P  DLVA Predicted % 85  P  TLC L 8.27  P  TLC % Predicted % 135  P  RV % Predicted % 54  P    P Preliminary result    Labs:  Path:  Echo 12/23/20: LV EF 35-40%. RV function and size is normal. LA is mildly dilated. Mild to moderate mitral valve regurgitation.   Heart Catheterization:  Assessment & Plan:   Centrilobular emphysema (HCC)  Adenocarcinoma of left lung, stage 3 (HCC)  Discussion: [name] [name] is a 71 year old male, former smoker with coronary artery disease, hypertension, hyperlipidemia, DM II and stage IIIb adenocarcinoma of the lung diagnosed 05/2021 who returns to pulmonary clinic for follow up of emphysema.  We will trial patient on trelegy ellipta 1 puff daily and monitor for further improvements in his dyspnea and cough. He can stop stiolto while using trelegy. If he notices improvement he is to let us know and we will send in a prescription.  PFTs today show mild diffusion defect.   Follow up in 6 months.   Freda Jackson, MD [facility] Office: [phone]   Current Outpatient Medications:    acetaminophen (TYLENOL) 500 MG tablet, Take 500 mg by mouth every 8 (eight) hours as needed for mild pain., Disp: , Rfl:    ALPRAZolam (XANAX) 0.25 MG tablet, Take 0.25 mg by mouth 3 (three) times daily as needed for anxiety  (nerves)., Disp: , Rfl:    aspirin 81 MG tablet, Take 81 mg by mouth daily. , Disp: , Rfl:    atorvastatin (LIPITOR) 80 MG tablet, Take 1 tablet by mouth once daily (Patient taking differently: Take 80 mg by mouth every evening.), Disp: 90 tablet, Rfl: 3   Azelastine HCl 0.15 % SOLN, Place 1 spray into both nostrils daily as needed (Allergies)., Disp: , Rfl:    carvedilol (COREG) 6.25 MG tablet, TAKE 1 TABLET TWICE DAILY WITH A MEAL, Disp: 180 tablet, Rfl: 3   cholecalciferol (VITAMIN D) 1000 UNITS tablet, Take 1,000 Units by mouth daily. , Disp: , Rfl:    clopidogrel (PLAVIX) 75 MG tablet, Take 75 mg by mouth daily with breakfast., Disp: , Rfl:    ezetimibe (ZETIA) 10 MG tablet, TAKE 1 TABLET EVERY DAY, Disp: 90 tablet, Rfl: 3   fluticasone (FLONASE) 50 MCG/ACT nasal spray, Place 2 sprays into the nose daily as needed for allergies or rhinitis (For nasal congestion)., Disp: , Rfl:    furosemide (LASIX) 40 MG tablet, Take 1 tablet by mouth once daily, Disp: 90 tablet, Rfl: 3   isosorbide mononitrate (IMDUR) 30 MG 24 hr tablet, Take one tablet by mouth once daily along with your Imdur 25m tablet for a total of 912m Disp: 90 tablet, Rfl: 3   isosorbide mononitrate (IMDUR) 60 MG 24 hr tablet, Take 1 tablet (60 mg total) by mouth daily., Disp: 90 tablet, Rfl: 3   metFORMIN (GLUCOPHAGE) 1000 MG tablet, Take 1,000 mg by mouth 2 (two) times daily with a meal., Disp: , Rfl:    nitroGLYCERIN (NITROSTAT) 0.4 MG SL tablet, Place 0.4 mg under the tongue every 5 (five) minutes x 3 doses as needed. For chest pain, Disp: , Rfl:    Polyvinyl Alcohol-Povidone (CLEAR EYES ALL SEASONS OP), Place 1 drop into both eyes daily as needed (for dry eyes)., Disp: , Rfl:  ranolazine (RANEXA) 500 MG 12 hr tablet, TAKE 1 TABLET TWICE DAILY, Disp: 180 tablet, Rfl: 2   sacubitril-valsartan (ENTRESTO) 97-103 MG, Take 1 tablet by mouth 2 (two) times daily., Disp: 60 tablet, Rfl: 11   Semaglutide,0.25 or 0.5MG/DOS, (OZEMPIC, 0.25  OR 0.5 MG/DOSE,) 2 MG/1.5ML SOPN, Inject 0.5 mg into the skin once a week. Takes on Wednesday, Disp: , Rfl:    Tiotropium Bromide-Olodaterol (STIOLTO RESPIMAT) 2.5-2.5 MCG/ACT AERS, Inhale 2 puffs into the lungs daily. (Patient taking differently: Inhale 1 puff into the lungs daily.), Disp: 8 g, Rfl: 5

## 2021-06-03 ENCOUNTER — Other Ambulatory Visit: Payer: Self-pay

## 2021-06-03 ENCOUNTER — Inpatient Hospital Stay: Payer: Medicare PPO

## 2021-06-03 ENCOUNTER — Inpatient Hospital Stay: Payer: Medicare PPO | Attending: Internal Medicine

## 2021-06-03 ENCOUNTER — Inpatient Hospital Stay: Payer: Medicare PPO | Admitting: Internal Medicine

## 2021-06-03 VITALS — BP 131/67 | HR 67 | Temp 97.1°F | Resp 18 | Wt 150.0 lb

## 2021-06-03 DIAGNOSIS — Z5111 Encounter for antineoplastic chemotherapy: Secondary | ICD-10-CM | POA: Diagnosis not present

## 2021-06-03 DIAGNOSIS — K1379 Other lesions of oral mucosa: Secondary | ICD-10-CM | POA: Insufficient documentation

## 2021-06-03 DIAGNOSIS — E1165 Type 2 diabetes mellitus with hyperglycemia: Secondary | ICD-10-CM | POA: Insufficient documentation

## 2021-06-03 DIAGNOSIS — C3432 Malignant neoplasm of lower lobe, left bronchus or lung: Secondary | ICD-10-CM | POA: Insufficient documentation

## 2021-06-03 DIAGNOSIS — C3492 Malignant neoplasm of unspecified part of left bronchus or lung: Secondary | ICD-10-CM

## 2021-06-03 DIAGNOSIS — D701 Agranulocytosis secondary to cancer chemotherapy: Secondary | ICD-10-CM | POA: Insufficient documentation

## 2021-06-03 DIAGNOSIS — C3490 Malignant neoplasm of unspecified part of unspecified bronchus or lung: Secondary | ICD-10-CM

## 2021-06-03 DIAGNOSIS — R197 Diarrhea, unspecified: Secondary | ICD-10-CM | POA: Diagnosis not present

## 2021-06-03 DIAGNOSIS — Z79899 Other long term (current) drug therapy: Secondary | ICD-10-CM | POA: Insufficient documentation

## 2021-06-03 LAB — CBC WITH DIFFERENTIAL (CANCER CENTER ONLY)
Abs Immature Granulocytes: 0.01 10*3/uL (ref 0.00–0.07)
Basophils Absolute: 0.1 10*3/uL (ref 0.0–0.1)
Basophils Relative: 1 %
Eosinophils Absolute: 0.3 10*3/uL (ref 0.0–0.5)
Eosinophils Relative: 4 %
HCT: 35 % — ABNORMAL LOW (ref 39.0–52.0)
Hemoglobin: 12.4 g/dL — ABNORMAL LOW (ref 13.0–17.0)
Immature Granulocytes: 0 %
Lymphocytes Relative: 26 %
Lymphs Abs: 2.1 10*3/uL (ref 0.7–4.0)
MCH: 31.1 pg (ref 26.0–34.0)
MCHC: 35.4 g/dL (ref 30.0–36.0)
MCV: 87.7 fL (ref 80.0–100.0)
Monocytes Absolute: 0.7 10*3/uL (ref 0.1–1.0)
Monocytes Relative: 9 %
Neutro Abs: 4.9 10*3/uL (ref 1.7–7.7)
Neutrophils Relative %: 60 %
Platelet Count: 209 10*3/uL (ref 150–400)
RBC: 3.99 MIL/uL — ABNORMAL LOW (ref 4.22–5.81)
RDW: 13.8 % (ref 11.5–15.5)
WBC Count: 8 10*3/uL (ref 4.0–10.5)
nRBC: 0 % (ref 0.0–0.2)

## 2021-06-03 LAB — CMP (CANCER CENTER ONLY)
ALT: 10 U/L (ref 0–44)
AST: 13 U/L — ABNORMAL LOW (ref 15–41)
Albumin: 3.8 g/dL (ref 3.5–5.0)
Alkaline Phosphatase: 84 U/L (ref 38–126)
Anion gap: 7 (ref 5–15)
BUN: 12 mg/dL (ref 8–23)
CO2: 27 mmol/L (ref 22–32)
Calcium: 9.7 mg/dL (ref 8.9–10.3)
Chloride: 92 mmol/L — ABNORMAL LOW (ref 98–111)
Creatinine: 1.03 mg/dL (ref 0.61–1.24)
GFR, Estimated: >60 mL/min (ref >60)
Glucose, Bld: 158 mg/dL — ABNORMAL HIGH (ref 70–99)
Potassium: 4.5 mmol/L (ref 3.5–5.1)
Sodium: 126 mmol/L — ABNORMAL LOW (ref 135–145)
Total Bilirubin: 1 mg/dL (ref 0.3–1.2)
Total Protein: 7.2 g/dL (ref 6.5–8.1)

## 2021-06-03 MED ORDER — DEXAMETHASONE 4 MG PO TABS: ORAL_TABLET | ORAL | 0 refills | Status: DC

## 2021-06-03 MED ORDER — FOLIC ACID 1 MG PO TABS: 1.0000 mg | ORAL_TABLET | Freq: Every day | ORAL | 4 refills | Status: AC

## 2021-06-03 MED ORDER — PROCHLORPERAZINE MALEATE 10 MG PO TABS: 10.0000 mg | ORAL_TABLET | Freq: Four times a day (QID) | ORAL | 0 refills | Status: DC | PRN

## 2021-06-03 MED ORDER — CYANOCOBALAMIN 1000 MCG/ML IJ SOLN
1000.0000 ug | Freq: Once | INTRAMUSCULAR | Status: AC
  Administered 2021-06-03: 1000 ug via INTRAMUSCULAR
  Filled 2021-06-03: qty 1

## 2021-06-03 NOTE — Progress Notes (Signed)
START OFF PATHWAY REGIMEN - Non-Small Cell Lung   [id number]:Carboplatin AUC=5 + Pemetrexed 500 mg/m2 q21 Days:   A cycle is every 21 days:     Pemetrexed      Carboplatin   **Always confirm dose/schedule in your pharmacy ordering system**  Patient Characteristics: Preoperative or Nonsurgical Candidate (Clinical Staging), Stage III - Nonsurgical Candidate (Nonsquamous and Squamous), PS = 0, 1 Therapeutic Status: Preoperative or Nonsurgical Candidate (Clinical Staging) AJCC T Category: cT4 AJCC N Category: cN2 AJCC M Category: cM0 AJCC 8 Stage Grouping: IIIB ECOG Performance Status: 1 Intent of Therapy: Curative Intent, Discussed with Patient

## 2021-06-03 NOTE — Progress Notes (Signed)
[facility] Telephone:([phone]) [phone]   [phone]  OFFICE PROGRESS NOTE  [name], MD [address] [place] [address]  DIAGNOSIS:  Stage IIIb (T4, N2, M0) non-small cell lung cancer, adenocarcinoma presented with large left lower lobe lung mass in addition to subcarinal lymphadenopathy diagnosed in May 2023 and pending further staging work-up. His molecular studies by [email or web address] showed no actionable mutations and PD-L1 expression is 2%.  PRIOR THERAPY: None  CURRENT THERAPY: Induction systemic chemotherapy with carboplatin for AUC of 5 and Alimta 500 Mg/M2 every 3 weeks for 3 cycles.  First cycle expected on 06/10/2021  INTERVAL HISTORY: [name] [name] 71 y.o. male returns to the clinic today for follow-up visit.  The patient is feeling fine today with no concerning complaints except for intermittent pain on the left side of his the chest and cough.  He denied having any shortness of breath or hemoptysis.  He has no nausea, vomiting, diarrhea or constipation.  He has no headache or visual changes.  He denied having any recent weight loss or night sweats.  He had several studies performed recently for evaluation of his disease and he is here for discussion of his scan results and treatment options.  MEDICAL HISTORY: Past Medical History:  Diagnosis Date   Anxiety    Arthritis    Chest pain    a. experience weekly, nitrate responsive c/p x several years.   b. ranexa added on 08/2014 admission    Chronic combined systolic and diastolic CHF (congestive heart failure) ([place])    Claudication ([place])    a. 08/2014 ABI: R 0.79, L 1.0-->Walking program instituted.   Coronary artery disease    a. s/p MI in 1989;  b. 04/2008 CABG x 5 (LIMA->LAD, VG->D1, VG->RI, VG->PDA, L Radial->OM2);  c. 06/2010 PCI/DES to the Diag (2.25x28 Promus Element DES). VG->Diag occluded;  d. 08/2010 Attempted PTCA of the OM2 via the free radial graft. Unsuccessful.  Lesion felt to  be 2/2 suture.  c. NSTEMI s/p [facility] with severe native and graph dz; D1 that was stented in 2012 now occulded: Rx therapy recommended   Coronary artery disease involving native coronary artery of native heart with angina pectoris ([place])    a. s/p MI in 1989;  b. 04/2008 CABG x 5 (LIMA->LAD, VG->D1, VG->RI, VG->PDA, L Radial->OM2);  c. 06/2010 PCI/DES to the Diag (2.25x28 Promus Element DES). VG->Diag occluded;  d. 08/2010 Attempted PTCA of the OM2 via the free radial graft. Unsuccessful.  Lesion felt to be 2/2 suture.  c. NSTEMI s/p [facility] with severe native and graph dz; Rx therapy recommended    COVID 2021   mild case   Diabetes mellitus ([place]) 06/02/2011   type 2   Hyperlipidemia    Hypertension    NSTEMI (non-ST elevated myocardial infarction) ([place]) 08/29/2014   Peripheral arterial disease ([place]) 08/16/2014   Pneumonia    Type II diabetes mellitus ([place])     ALLERGIES:  is allergic to aldactone [spironolactone], amlodipine, farxiga [dapagliflozin], percocet [oxycodone-acetaminophen], trulicity [dulaglutide], avandia [rosiglitazone], and codeine.  MEDICATIONS:  Current Outpatient Medications  Medication Sig Dispense Refill   acetaminophen (TYLENOL) 500 MG tablet Take 500 mg by mouth every 8 (eight) hours as needed for mild pain.     ALPRAZolam (XANAX) 0.25 MG tablet Take 0.25 mg by mouth 3 (three) times daily as needed for anxiety (nerves).     aspirin 81 MG tablet Take 81 mg by mouth daily.  atorvastatin (LIPITOR) 80 MG tablet Take 1 tablet by mouth once daily (Patient taking differently: Take 80 mg by mouth every evening.) 90 tablet 3   Azelastine HCl 0.15 % SOLN Place 1 spray into both nostrils daily as needed (Allergies).     carvedilol (COREG) 6.25 MG tablet TAKE 1 TABLET TWICE DAILY WITH A MEAL 180 tablet 3   cholecalciferol (VITAMIN D) 1000 UNITS tablet Take 1,000 Units by mouth daily.      clopidogrel (PLAVIX) 75 MG tablet Take 75 mg by mouth daily with breakfast.     ezetimibe (ZETIA)  10 MG tablet TAKE 1 TABLET EVERY DAY 90 tablet 3   fluticasone (FLONASE) 50 MCG/ACT nasal spray Place 2 sprays into the nose daily as needed for allergies or rhinitis (For nasal congestion).     Fluticasone-Umeclidin-Vilant (TRELEGY ELLIPTA) 100-62.5-25 MCG/ACT AEPB Inhale 1 puff into the lungs daily. 1 each 0   furosemide (LASIX) 40 MG tablet Take 1 tablet by mouth once daily 90 tablet 3   isosorbide mononitrate (IMDUR) 30 MG 24 hr tablet Take one tablet by mouth once daily along with your Imdur 73m tablet for a total of 917m90 tablet 3   isosorbide mononitrate (IMDUR) 60 MG 24 hr tablet Take 1 tablet (60 mg total) by mouth daily. 90 tablet 3   metFORMIN (GLUCOPHAGE) 1000 MG tablet Take 1,000 mg by mouth 2 (two) times daily with a meal.     nitroGLYCERIN (NITROSTAT) 0.4 MG SL tablet Place 0.4 mg under the tongue every 5 (five) minutes x 3 doses as needed. For chest pain     Polyvinyl Alcohol-Povidone (CLEAR EYES ALL SEASONS OP) Place 1 drop into both eyes daily as needed (for dry eyes).     ranolazine (RANEXA) 500 MG 12 hr tablet TAKE 1 TABLET TWICE DAILY 180 tablet 2   sacubitril-valsartan (ENTRESTO) 97-103 MG Take 1 tablet by mouth 2 (two) times daily. 60 tablet 11   Semaglutide,0.25 or 0.5MG/DOS, (OZEMPIC, 0.25 OR 0.5 MG/DOSE,) 2 MG/1.5ML SOPN Inject 0.5 mg into the skin once a week. Takes on Wednesday     No current facility-administered medications for this visit.    SURGICAL HISTORY:  Past Surgical History:  Procedure Laterality Date   BRONCHIAL BIOPSY  05/04/2021   Procedure: BRONCHIAL BIOPSIES;  Surgeon: IcGarner NashDO;  [facility]: [facility]monary;;   BRONCHIAL BRUSHINGS  05/04/2021   Procedure: BRONCHIAL BRUSHINGS;  Surgeon: IcGarner NashDO;  [facility]: [facility];;   BRONCHIAL WASHINGS  05/04/2021   Procedure: BRONCHIAL WASHINGS;  Surgeon: IcGarner NashDO;  [facility]: [facility];;   CARDIAC CATHETERIZATION  N/A 08/28/2014   Procedure: Left Heart Cath and Cors/Grafts Angiography;  Surgeon: HeBelva CromeMD;  [facility];  Service: Cardiovascular;  Laterality: N/A;   COLONOSCOPY     CORONARY ANGIOPLASTY WITH STENT PLACEMENT     CORONARY ARTERY BYPASS GRAFT     FINE NEEDLE ASPIRATION  05/04/2021   Procedure: FINE NEEDLE ASPIRATION (FNA) LINEAR;  Surgeon: IcGarner NashDO;  [facility]: Pulmonary;;   LEFT HEART CATH AND CORS/GRAFTS ANGIOGRAPHY N/A 03/20/2018   Procedure: LEFT HEART CATH AND CORS/GRAFTS ANGIOGRAPHY;  Surgeon: JoMartiniquePeter M, MD;  [facility];  Service: Cardiovascular;  Laterality: N/A;   PERIPHERAL VASCULAR CATHETERIZATION N/A 07/15/2015   Procedure: Abdominal Aortogram w/Lower Extremity;  Surgeon: MuWellington HampshireMD;  [facility];  Service: Cardiovascular;  Laterality: N/A;   PERIPHERAL VASCULAR CATHETERIZATION Bilateral 07/15/2015   Procedure: Peripheral Vascular Intervention;  Surgeon: Wellington Hampshire, MD;  Location: [place] [facility] LAB;  Service: Cardiovascular;  Laterality: Bilateral;  Common Iliacs   SHOULDER ARTHROSCOPY WITH ROTATOR CUFF REPAIR AND SUBACROMIAL DECOMPRESSION Left 05/23/2013   Procedure: LEFT SHOULDER ARTHROSCOPY SUBACROMIAL  DECOMPRESSION DISTAL CLAVICLE RESECTION AND ROTATOR CUFF REPAIR ;  Surgeon: Marin Shutter, MD;  Location: [facility];  Service: Orthopedics;  Laterality: Left;   SHOULDER SURGERY Right 7-8 years ago   Dr. Dorna Leitz   VIDEO BRONCHOSCOPY WITH ENDOBRONCHIAL ULTRASOUND Bilateral 05/04/2021   Procedure: VIDEO BRONCHOSCOPY WITH ENDOBRONCHIAL ULTRASOUND;  Surgeon: Garner Nash, DO;  Location: [facility];  Service: Pulmonary;  Laterality: Bilateral;    REVIEW OF SYSTEMS:  Constitutional: negative Eyes: negative Ears, nose, mouth, throat, and face: negative Respiratory: positive for pleurisy/chest pain Cardiovascular: negative Gastrointestinal:  negative Genitourinary:negative Integument/breast: negative Hematologic/lymphatic: negative Musculoskeletal:negative Neurological: negative Behavioral/Psych: negative Endocrine: negative Allergic/Immunologic: negative   PHYSICAL EXAMINATION: General appearance: alert, cooperative, and no distress Head: Normocephalic, without obvious abnormality, atraumatic Neck: no adenopathy, no JVD, supple, symmetrical, trachea midline, and thyroid not enlarged, symmetric, no tenderness/mass/nodules Lymph nodes: Cervical, supraclavicular, and axillary nodes normal. Resp: diminished breath sounds LLL and dullness to percussion LLL Back: symmetric, no curvature. ROM normal. No CVA tenderness. Cardio: regular rate and rhythm, S1, S2 normal, no murmur, click, rub or gallop GI: soft, non-tender; bowel sounds normal; no masses,  no organomegaly Extremities: extremities normal, atraumatic, no cyanosis or edema Neurologic: Alert and oriented X 3, normal strength and tone. Normal symmetric reflexes. Normal coordination and gait  ECOG PERFORMANCE STATUS: 1 - Symptomatic but completely ambulatory  Blood pressure 131/67, pulse 67, temperature (!) 97.1 F (36.2 C), temperature source Tympanic, resp. rate 18, weight 150 lb (68 kg), SpO2 96 %.  LABORATORY DATA: Lab Results  Component Value Date   WBC 8.0 06/03/2021   HGB 12.4 (L) 06/03/2021   HCT 35.0 (L) 06/03/2021   MCV 87.7 06/03/2021   PLT 209 06/03/2021      Chemistry      Component Value Date/Time   NA 126 (L) 06/03/2021 0927   NA 127 (L) 03/24/2021 1025   K 4.5 06/03/2021 0927   CL 92 (L) 06/03/2021 0927   CO2 27 06/03/2021 0927   BUN 12 06/03/2021 0927   BUN 13 03/24/2021 1025   CREATININE 1.03 06/03/2021 0927   CREATININE 0.88 11/13/2015 1131      Component Value Date/Time   CALCIUM 9.7 06/03/2021 0927   ALKPHOS 84 06/03/2021 0927   AST 13 (L) 06/03/2021 0927   ALT 10 06/03/2021 0927   BILITOT 1.0 06/03/2021 0927        RADIOGRAPHIC STUDIES: MR BRAIN W WO CONTRAST  Result Date: 06/01/2021 CLINICAL DATA:  Non-small cell lung cancer, staging. Malignant neoplasm of unspecified part of bronchus or lung. EXAM: MRI HEAD WITHOUT AND WITH CONTRAST TECHNIQUE: Multiplanar, multiecho pulse sequences of the brain and surrounding structures were obtained without and with intravenous contrast. CONTRAST:  60m GADAVIST GADOBUTROL 1 MMOL/ML IV SOLN COMPARISON:  None Available. FINDINGS: Brain: Mild age related volume loss. No evidence of acute or subacute infarction. No evidence of metastatic disease. Mild chronic small-vessel ischemic change of the cerebral hemispheric white matter. No cortical or large vessel territory infarction. No mass, hemorrhage, hydrocephalus or extra-axial collection. No abnormal contrast enhancement occurs. Vascular: Major vessels at the base of the brain show flow.  Skull and upper cervical spine: Negative Sinuses/Orbits: Clear/normal Other: None IMPRESSION: No evidence of metastatic disease. No acute intracranial finding. Mild chronic small-vessel ischemic change of the cerebral hemispheric white matter. Electronically Signed   By: Nelson Chimes M.D.   On: 06/01/2021 07:55   NM PET Image Initial (PI) Skull Base To Thigh (F-18 FDG)  Result Date: 05/29/2021 CLINICAL DATA:  Initial treatment strategy for non-small lung cancer. Adenocarcinoma on bronchoscopy with station 7 involvement. EXAM: NUCLEAR MEDICINE PET SKULL BASE TO THIGH TECHNIQUE: 7.51 mCi F-18 FDG was injected intravenously. Full-ring PET imaging was performed from the skull base to thigh after the radiotracer. CT data was obtained and used for attenuation correction and anatomic localization. Fasting blood glucose: 132 mg/dl COMPARISON:  Chest CT 04/30/2021 and 02/05/2021 FINDINGS: Mediastinal blood pool activity: SUV max 2.1 NECK: No hypermetabolic cervical lymph nodes are identified.There are no lesions of the pharyngeal mucosal space.  Incidental CT findings: Severe bilateral carotid atherosclerosis. CHEST: There are small nonspecific mildly hypermetabolic mediastinal and hilar lymph nodes bilaterally. Representative nodes include inferior right hilar node with an SUV max of 4.0, a left hilar node with an SUV max of 3.8 and a subcarinal node with an SUV max of 3.8. There is heterogeneous low level hypermetabolic activity throughout the previously demonstrated left lower lobe consolidation (greatest SUV max 5.3 superomedially). No hypermetabolic activity or suspicious pulmonary nodularity within the left upper lobe or the right lung. Incidental CT findings: Stable small left pleural effusion without significant hypermetabolic activity. Diffuse atherosclerosis of the aorta, great vessels and coronary arteries status post median sternotomy and CABG. There are calcifications of the aortic valve. The heart size is at the upper limits of normal. Centrilobular and paraseptal emphysema noted. ABDOMEN/PELVIS: There is no hypermetabolic activity within the liver, adrenal glands, spleen or pancreas. There is no hypermetabolic nodal activity. Scattered bowel activity within physiologic limits. Incidental CT findings: Small calcified gallstones. Diffuse aortic and branch vessel atherosclerosis. SKELETON: There is no hypermetabolic activity to suggest osseous metastatic disease. Incidental CT findings: Multilevel spondylosis. IMPRESSION: 1. Nonspecific low-level hypermetabolic activity within the progressive mass-like consolidation in the left lower lobe corresponding with known adenocarcinoma on bronchoscopic biopsy/brushings. No suspicious pulmonary metabolic activity outside of the left lower lobe. 2. Nonspecific small mildly hypermetabolic mediastinal and hilar lymph nodes bilaterally which are suspicious for early nodal metastases given previous positive subcarinal nodal biopsy. 3. No evidence of distant metastatic disease. 4. Cholelithiasis. Coronary and  aortic atherosclerosis (ICD10-I70.0). Emphysema (ICD10-J43.9). Electronically Signed   By: Richardean Sale M.D.   On: 05/29/2021 11:47   DG Chest Port 1 View  Result Date: 05/04/2021 CLINICAL DATA:  Status post bronchoscopy with biopsy. Left lower lobe masslike condition. EXAM: PORTABLE CHEST 1 VIEW COMPARISON:  Multiple exams, including chest CT 04/30/2021 and chest radiograph 01/08/2021 FINDINGS: Chronic left lower lobe airspace opacity as on prior exams. Accounting for the tubes projecting over the left lung apex, no pneumothorax is identified. Prior CABG. Mild peripheral interstitial accentuation favoring the lung bases. Heart size within normal limits for the PA technique. Atherosclerotic calcification of the aortic arch. Calcific tendinopathy in the right rotator cuff. IMPRESSION: 1. No pneumothorax or complicating features status post bronchoscopy. 2. Continued airspace opacity in the left lower lobe. 3.  Aortic Atherosclerosis (ICD10-I70.0). 4. Prior CABG. 5. Calcific right rotator cuff tendinopathy. Electronically Signed   By: Van Clines M.D.   On: 05/04/2021 13:59   DG C-ARM BRONCHOSCOPY  Result Date: 05/04/2021 C-ARM BRONCHOSCOPY: Fluoroscopy was utilized by  the requesting physician.  No radiographic interpretation.    ASSESSMENT AND PLAN: This is a very pleasant 71 years old white male with recently diagnosed stage IIIB (T4, N0/N2, M0) non-small cell lung cancer, adenocarcinoma presented with large left lower lobe lung mass and suspicious mediastinal lymphadenopathy diagnosed in May 2023. The patient had a PET scan as well as MRI of the brain for restaging of his disease. I personally and independently reviewed the scan images and discussed the result with the patient today. His scan showed no concerning findings for disease progression outside the left lower lobe of the lung. The patient has large volume of disease in that area and per discussion with radiation oncology it was  advised to consider him for an induction chemotherapy for 2-3 cycles with the hope to decrease the volume of the disease to be irradiated in the near future. I discussed this option with the patient and recommended for him systemic chemotherapy with carboplatin for AUC of 5 and Alimta 500 Mg/M2 every 3 weeks for 3 cycles. I discussed with the patient the adverse effect of this treatment including but not limited to alopecia, myelosuppression, nausea and vomiting, peripheral neuropathy, liver or renal dysfunction. The patient will receive vitamin B12 injection today. I will call his pharmacy with prescription for folic acid 1 mg p.o. daily in addition to Compazine 10 mg p.o. every 6 hours as needed for nausea and Decadron 4 mg p.o. twice daily the day before, day of and day after the chemotherapy every 3 weeks. The patient will have a chemotherapy education class before the first dose of his treatment. I will see him back for follow-up visit in 2 weeks for evaluation and management of any adverse effects of his treatment. The patient was advised to call immediately if he has any other concerning symptoms in the interval. The patient voices understanding of current disease status and treatment options and is in agreement with the current care plan.  All questions were answered. The patient knows to call the clinic with any problems, questions or concerns. We can certainly see the patient much sooner if necessary.  The total time spent in the appointment was 40 minutes.  Disclaimer: This note was dictated with voice recognition software. Similar sounding words can inadvertently be transcribed and may not be corrected upon review.

## 2021-06-04 ENCOUNTER — Telehealth: Payer: Self-pay | Admitting: Internal Medicine

## 2021-06-04 NOTE — Telephone Encounter (Signed)
Scheduled per 06/01 los, patient has been called and notified.

## 2021-06-06 ENCOUNTER — Encounter: Payer: Self-pay | Admitting: Pulmonary Disease

## 2021-06-07 ENCOUNTER — Other Ambulatory Visit: Payer: Self-pay

## 2021-06-07 ENCOUNTER — Inpatient Hospital Stay: Payer: Medicare PPO

## 2021-06-08 ENCOUNTER — Encounter: Payer: Self-pay | Admitting: Cardiovascular Disease

## 2021-06-08 ENCOUNTER — Encounter: Payer: Self-pay | Admitting: Internal Medicine

## 2021-06-08 DIAGNOSIS — H10013 Acute follicular conjunctivitis, bilateral: Secondary | ICD-10-CM | POA: Diagnosis not present

## 2021-06-08 DIAGNOSIS — T1512XA Foreign body in conjunctival sac, left eye, initial encounter: Secondary | ICD-10-CM | POA: Diagnosis not present

## 2021-06-08 NOTE — Progress Notes (Signed)
Patient called after receiving my card per my request.  Introduced myself as Arboriculturist and to offer available resources. Advised what is needed to apply and he states he can bring on 6/8  at next appointment. He will then be given grant paperwork to complete and call me at his earliest convenience to discuss expenses in detail and how they are covered.  Also advised, if there are any available copay programs for his treatment plan or diagnosis, I would apply on his behalf if not met ded/OOP which he states he has not.  He has my card for any additional financial questions or concerns.

## 2021-06-09 ENCOUNTER — Other Ambulatory Visit: Payer: Self-pay

## 2021-06-09 DIAGNOSIS — C3492 Malignant neoplasm of unspecified part of left bronchus or lung: Secondary | ICD-10-CM

## 2021-06-09 MED FILL — Dexamethasone Sodium Phosphate Inj 100 MG/10ML: INTRAMUSCULAR | Qty: 1 | Status: AC

## 2021-06-09 MED FILL — Fosaprepitant Dimeglumine For IV Infusion 150 MG (Base Eq): INTRAVENOUS | Qty: 5 | Status: AC

## 2021-06-10 ENCOUNTER — Inpatient Hospital Stay: Payer: Medicare PPO

## 2021-06-10 ENCOUNTER — Other Ambulatory Visit: Payer: Self-pay | Admitting: Lab

## 2021-06-10 ENCOUNTER — Other Ambulatory Visit: Payer: Self-pay

## 2021-06-10 VITALS — BP 119/84 | HR 87 | Temp 98.1°F | Resp 18 | Wt 156.5 lb

## 2021-06-10 DIAGNOSIS — C3492 Malignant neoplasm of unspecified part of left bronchus or lung: Secondary | ICD-10-CM

## 2021-06-10 DIAGNOSIS — R197 Diarrhea, unspecified: Secondary | ICD-10-CM | POA: Diagnosis not present

## 2021-06-10 DIAGNOSIS — D701 Agranulocytosis secondary to cancer chemotherapy: Secondary | ICD-10-CM | POA: Diagnosis not present

## 2021-06-10 DIAGNOSIS — Z5111 Encounter for antineoplastic chemotherapy: Secondary | ICD-10-CM | POA: Diagnosis not present

## 2021-06-10 DIAGNOSIS — Z79899 Other long term (current) drug therapy: Secondary | ICD-10-CM | POA: Diagnosis not present

## 2021-06-10 DIAGNOSIS — K1379 Other lesions of oral mucosa: Secondary | ICD-10-CM | POA: Diagnosis not present

## 2021-06-10 DIAGNOSIS — C3432 Malignant neoplasm of lower lobe, left bronchus or lung: Secondary | ICD-10-CM | POA: Diagnosis not present

## 2021-06-10 DIAGNOSIS — E1165 Type 2 diabetes mellitus with hyperglycemia: Secondary | ICD-10-CM | POA: Diagnosis not present

## 2021-06-10 LAB — CMP (CANCER CENTER ONLY)
ALT: 13 U/L (ref 0–44)
AST: 23 U/L (ref 15–41)
Albumin: 3.9 g/dL (ref 3.5–5.0)
Alkaline Phosphatase: 72 U/L (ref 38–126)
Anion gap: 11 (ref 5–15)
BUN: 16 mg/dL (ref 8–23)
CO2: 23 mmol/L (ref 22–32)
Calcium: 10 mg/dL (ref 8.9–10.3)
Chloride: 96 mmol/L — ABNORMAL LOW (ref 98–111)
Creatinine: 1 mg/dL (ref 0.61–1.24)
GFR, Estimated: >60 mL/min (ref >60)
Glucose, Bld: 227 mg/dL — ABNORMAL HIGH (ref 70–99)
Potassium: 4.5 mmol/L (ref 3.5–5.1)
Sodium: 130 mmol/L — ABNORMAL LOW (ref 135–145)
Total Bilirubin: 0.7 mg/dL (ref 0.3–1.2)
Total Protein: 7.1 g/dL (ref 6.5–8.1)

## 2021-06-10 LAB — CBC WITH DIFFERENTIAL (CANCER CENTER ONLY)
Abs Immature Granulocytes: 0.05 10*3/uL (ref 0.00–0.07)
Basophils Absolute: 0 10*3/uL (ref 0.0–0.1)
Basophils Relative: 0 %
Eosinophils Absolute: 0 10*3/uL (ref 0.0–0.5)
Eosinophils Relative: 0 %
HCT: 31.9 % — ABNORMAL LOW (ref 39.0–52.0)
Hemoglobin: 11.3 g/dL — ABNORMAL LOW (ref 13.0–17.0)
Immature Granulocytes: 1 %
Lymphocytes Relative: 11 %
Lymphs Abs: 1.1 10*3/uL (ref 0.7–4.0)
MCH: 31.5 pg (ref 26.0–34.0)
MCHC: 35.4 g/dL (ref 30.0–36.0)
MCV: 88.9 fL (ref 80.0–100.0)
Monocytes Absolute: 0.4 10*3/uL (ref 0.1–1.0)
Monocytes Relative: 5 %
Neutro Abs: 8.2 10*3/uL — ABNORMAL HIGH (ref 1.7–7.7)
Neutrophils Relative %: 83 %
Platelet Count: 192 10*3/uL (ref 150–400)
RBC: 3.59 MIL/uL — ABNORMAL LOW (ref 4.22–5.81)
RDW: 14 % (ref 11.5–15.5)
WBC Count: 9.8 10*3/uL (ref 4.0–10.5)
nRBC: 0 % (ref 0.0–0.2)

## 2021-06-10 MED ORDER — SODIUM CHLORIDE 0.9 % IV SOLN
432.0000 mg | Freq: Once | INTRAVENOUS | Status: AC
  Administered 2021-06-10: 430 mg via INTRAVENOUS
  Filled 2021-06-10: qty 43

## 2021-06-10 MED ORDER — SODIUM CHLORIDE 0.9 % IV SOLN: Freq: Once | INTRAVENOUS | Status: AC

## 2021-06-10 MED ORDER — SODIUM CHLORIDE 0.9 % IV SOLN
150.0000 mg | Freq: Once | INTRAVENOUS | Status: AC
  Administered 2021-06-10: 150 mg via INTRAVENOUS
  Filled 2021-06-10: qty 150

## 2021-06-10 MED ORDER — SODIUM CHLORIDE 0.9 % IV SOLN
500.0000 mg/m2 | Freq: Once | INTRAVENOUS | Status: AC
  Administered 2021-06-10: 900 mg via INTRAVENOUS
  Filled 2021-06-10: qty 20

## 2021-06-10 MED ORDER — SODIUM CHLORIDE 0.9 % IV SOLN
10.0000 mg | Freq: Once | INTRAVENOUS | Status: AC
  Administered 2021-06-10: 10 mg via INTRAVENOUS
  Filled 2021-06-10: qty 10

## 2021-06-10 MED ORDER — PALONOSETRON HCL INJECTION 0.25 MG/5ML
0.2500 mg | Freq: Once | INTRAVENOUS | Status: AC
  Administered 2021-06-10: 0.25 mg via INTRAVENOUS
  Filled 2021-06-10: qty 5

## 2021-06-10 NOTE — Patient Instructions (Signed)
[facility]  Discharge Instructions: Thank you for choosing [facility] to provide your oncology and hematology care.   If you have a lab appointment with the [facility], please go directly to the [facility] and check in at the registration area.   Wear comfortable clothing and clothing appropriate for easy access to any Portacath or PICC line.   We strive to give you quality time with your provider. You may need to reschedule your appointment if you arrive late (15 or more minutes).  Arriving late affects you and other patients whose appointments are after yours.  Also, if you miss three or more appointments without notifying the office, you may be dismissed from the clinic at the provider's discretion.      For prescription refill requests, have your pharmacy contact our office and allow 72 hours for refills to be completed.    Today you received the following chemotherapy and/or immunotherapy agents; Carboplatin, Alimta      To help prevent nausea and vomiting after your treatment, we encourage you to take your nausea medication as directed.  BELOW ARE SYMPTOMS THAT SHOULD BE REPORTED IMMEDIATELY: *FEVER GREATER THAN 100.4 F (38 C) OR HIGHER *CHILLS OR SWEATING *NAUSEA AND VOMITING THAT IS NOT CONTROLLED WITH YOUR NAUSEA MEDICATION *UNUSUAL SHORTNESS OF BREATH *UNUSUAL BRUISING OR BLEEDING *URINARY PROBLEMS (pain or burning when urinating, or frequent urination) *BOWEL PROBLEMS (unusual diarrhea, constipation, pain near the anus) TENDERNESS IN MOUTH AND THROAT WITH OR WITHOUT PRESENCE OF ULCERS (sore throat, sores in mouth, or a toothache) UNUSUAL RASH, SWELLING OR PAIN  UNUSUAL VAGINAL DISCHARGE OR ITCHING   Items with * indicate a potential emergency and should be followed up as soon as possible or go to the Emergency Department if any problems should occur.  Please show the CHEMOTHERAPY ALERT CARD or IMMUNOTHERAPY ALERT CARD at  check-in to the Emergency Department and triage nurse.  Should you have questions after your visit or need to cancel or reschedule your appointment, please contact [facility]  Dept: [phone]  and follow the prompts.  Office hours are 8:00 a.m. to 4:30 p.m. Monday - Friday. Please note that voicemails left after 4:00 p.m. may not be returned until the following business day.  We are closed weekends and major holidays. You have access to a nurse at all times for urgent questions. Please call the main number to the clinic Dept: [phone] and follow the prompts.   For any non-urgent questions, you may also contact your provider using MyChart. We now offer e-Visits for anyone 39 and older to request care online for non-urgent symptoms. For details visit [email or web address].   Also download the MyChart app! Go to the app store, search "MyChart", open the app, select [facility], and log in with your MyChart username and password.  Due to Covid, a mask is required upon entering the hospital/clinic. If you do not have a mask, one will be given to you upon arrival. For doctor visits, patients may have 1 support Elisse Pennick aged 18 or older with them. For treatment visits, patients cannot have anyone with them due to current Covid guidelines and our immunocompromised population.

## 2021-06-11 ENCOUNTER — Telehealth: Payer: Self-pay

## 2021-06-11 NOTE — Telephone Encounter (Signed)
LM for patient that this nurse was calling to see how they were doing after their treatment. Please call back to Dr. Mohamed's nurse at [phone] if they have any questions or concerns regarding the treatment.  

## 2021-06-11 NOTE — Telephone Encounter (Signed)
-----   Message from Daphane Shepherd, RN sent at 06/10/2021 12:59 PM EDT ----- Regarding: First time [place]/Alimta Pt of Dr. Julien Nordmann, first time [place] and alimta. Did great no complaints or signs of distress at discharge.

## 2021-06-15 ENCOUNTER — Encounter (HOSPITAL_COMMUNITY): Payer: Self-pay

## 2021-06-15 NOTE — Progress Notes (Signed)
[facility] OFFICE PROGRESS NOTE  [name], MD [address] [name] [address]  DIAGNOSIS: Stage IIIb (T4, N2, M0) non-small cell lung cancer, adenocarcinoma presented with large left lower lobe lung mass in addition to subcarinal lymphadenopathy diagnosed in May 2023 and pending further staging work-up. His molecular studies by [employer] showed no actionable mutations and PD-L1 expression is 2%.  PRIOR THERAPY: None  CURRENT THERAPY:  Induction systemic chemotherapy with carboplatin for AUC of 5 and Alimta 500 Mg/M2 every 3 weeks for 3 cycles.  First cycle expected on 06/10/2021. Status post 1 cycle.   INTERVAL HISTORY: [name] [name] 71 y.o. male returns to the clinic today for a follow-up visit accompanied by his wife.  The patient was recently diagnosed with stage IIIb lung cancer.  The patient's current treatment includes 2-3 rounds of chemotherapy in hopes to shrink the tumor and decrease the size of the radiation field so we can consider concurrent chemoradiation.  The patient underwent his first cycle of chemotherapy last week and he tolerated it fairly well without any adverse side effects except for mouth soreness.   Today, he denies any fever, chills, night sweats, or unexplained weight loss.  He has a good appetite except for 2 days after taking Ozempic he may have diminished appetite.  He tries to eat small frequent meals.  He reports dyspnea on exertion which may have gradually been worsening.  He reports that he often needs to rest which improves his breathing.  He continues to have the "same" cough that he has been having for approximately 1 year.  He states that sometimes wakes him up at night and he has to clear the phlegm out of his throat.  He states he will drink hot water and honey.  He uses inhalers and sometimes Mucinex.  He also reports he continues to have similar occasional left-sided chest discomfort.  He had mild nausea which was  controlled with his antiemetic.  Denies any vomiting.  He had some constipation that was mild a few days following treatment which ended up switching to diarrhea which was no more than 1 time per day.  His bowel habits are regular at this time.  Denies any headache or visual changes.  He denies any dysuria or skin infections.  Denies any abdominal pain.  He denies any sinus infections.  The patient is here today for 1 week follow-up visit and to manage any adverse side effects of treatment.    MEDICAL HISTORY: Past Medical History:  Diagnosis Date   Anxiety    Arthritis    Chest pain    a. experience weekly, nitrate responsive c/p x several years.   b. ranexa added on 08/2014 admission    Chronic combined systolic and diastolic CHF (congestive heart failure) ([place])    Claudication ([place])    a. 08/2014 ABI: R 0.79, L 1.0-->Walking program instituted.   Coronary artery disease    a. s/p MI in 1989;  b. 04/2008 CABG x 5 (LIMA->LAD, VG->D1, VG->RI, VG->PDA, L Radial->OM2);  c. 06/2010 PCI/DES to the Diag (2.25x28 Promus Element DES). VG->Diag occluded;  d. 08/2010 Attempted PTCA of the OM2 via the free radial graft. Unsuccessful.  Lesion felt to be 2/2 suture.  c. NSTEMI s/p [facility] with severe native and graph dz; D1 that was stented in 2012 now occulded: Rx therapy recommended   Coronary artery disease involving native coronary artery of native heart with angina pectoris ([place])    a.  s/p MI in 1989;  b. 04/2008 CABG x 5 (LIMA->LAD, VG->D1, VG->RI, VG->PDA, L Radial->OM2);  c. 06/2010 PCI/DES to the Diag (2.25x28 Promus Element DES). VG->Diag occluded;  d. 08/2010 Attempted PTCA of the OM2 via the free radial graft. Unsuccessful.  Lesion felt to be 2/2 suture.  c. NSTEMI s/p [facility] with severe native and graph dz; Rx therapy recommended    COVID 2021   mild case   Diabetes mellitus ([place]) 06/02/2011   type 2   Hyperlipidemia    Hypertension    NSTEMI (non-ST elevated myocardial infarction) ([place]) 08/29/2014    Peripheral arterial disease ([place]) 08/16/2014   Pneumonia    Type II diabetes mellitus ([place])     ALLERGIES:  is allergic to aldactone [spironolactone], amlodipine, farxiga [dapagliflozin], percocet [oxycodone-acetaminophen], trulicity [dulaglutide], avandia [rosiglitazone], and codeine.  MEDICATIONS:  Current Outpatient Medications  Medication Sig Dispense Refill   acetaminophen (TYLENOL) 500 MG tablet Take 500 mg by mouth every 8 (eight) hours as needed for mild pain.     ALPRAZolam (XANAX) 0.25 MG tablet Take 0.25 mg by mouth 3 (three) times daily as needed for anxiety (nerves).     aspirin 81 MG tablet Take 81 mg by mouth daily.      atorvastatin (LIPITOR) 80 MG tablet Take 1 tablet by mouth once daily (Patient taking differently: Take 80 mg by mouth every evening.) 90 tablet 3   Azelastine HCl 0.15 % SOLN Place 1 spray into both nostrils daily as needed (Allergies).     carvedilol (COREG) 6.25 MG tablet TAKE 1 TABLET TWICE DAILY WITH A MEAL 180 tablet 3   cholecalciferol (VITAMIN D) 1000 UNITS tablet Take 1,000 Units by mouth daily.      clopidogrel (PLAVIX) 75 MG tablet Take 75 mg by mouth daily with breakfast.     dexamethasone (DECADRON) 4 MG tablet 4 mg p.o. twice daily the day before, day of and day after the chemotherapy every 3 weeks 20 tablet 0   ezetimibe (ZETIA) 10 MG tablet TAKE 1 TABLET EVERY DAY 90 tablet 3   fluticasone (FLONASE) 50 MCG/ACT nasal spray Place 2 sprays into the nose daily as needed for allergies or rhinitis (For nasal congestion).     Fluticasone-Umeclidin-Vilant (TRELEGY ELLIPTA) 100-62.5-25 MCG/ACT AEPB Inhale 1 puff into the lungs daily. 1 each 0   folic acid (FOLVITE) 1 MG tablet Take 1 tablet (1 mg total) by mouth daily. 30 tablet 4   furosemide (LASIX) 40 MG tablet Take 1 tablet by mouth once daily 90 tablet 3   isosorbide mononitrate (IMDUR) 30 MG 24 hr tablet Take one tablet by mouth once daily along with your Imdur 41m tablet for a total of 957m90  tablet 3   isosorbide mononitrate (IMDUR) 60 MG 24 hr tablet Take 1 tablet (60 mg total) by mouth daily. 90 tablet 3   metFORMIN (GLUCOPHAGE) 1000 MG tablet Take 1,000 mg by mouth 2 (two) times daily with a meal.     nitroGLYCERIN (NITROSTAT) 0.4 MG SL tablet Place 0.4 mg under the tongue every 5 (five) minutes x 3 doses as needed. For chest pain     Polyvinyl Alcohol-Povidone (CLEAR EYES ALL SEASONS OP) Place 1 drop into both eyes daily as needed (for dry eyes).     prochlorperazine (COMPAZINE) 10 MG tablet Take 1 tablet (10 mg total) by mouth every 6 (six) hours as needed for nausea or vomiting. 30 tablet 0   ranolazine (RANEXA) 500 MG 12 hr tablet TAKE 1  TABLET TWICE DAILY 180 tablet 2   sacubitril-valsartan (ENTRESTO) 97-103 MG Take 1 tablet by mouth 2 (two) times daily. 60 tablet 11   Semaglutide,0.25 or 0.5MG/DOS, (OZEMPIC, 0.25 OR 0.5 MG/DOSE,) 2 MG/1.5ML SOPN Inject 0.5 mg into the skin once a week. Takes on Wednesday     No current facility-administered medications for this visit.    SURGICAL HISTORY:  Past Surgical History:  Procedure Laterality Date   BRONCHIAL BIOPSY  05/04/2021   Procedure: BRONCHIAL BIOPSIES;  Surgeon: Garner Nash, DO;  Location: [facility];  Service: Pulmonary;;   BRONCHIAL BRUSHINGS  05/04/2021   Procedure: BRONCHIAL BRUSHINGS;  Surgeon: Garner Nash, DO;  Location: [facility] [place];  Service: Pulmonary;;   BRONCHIAL WASHINGS  05/04/2021   Procedure: BRONCHIAL WASHINGS;  Surgeon: Garner Nash, DO;  Location: [place] [facility];  Service: Pulmonary;;   CARDIAC CATHETERIZATION N/A 08/28/2014   Procedure: Left Heart Cath and Cors/Grafts Angiography;  Surgeon: Belva Crome, MD;  Location: [facility];  Service: Cardiovascular;  Laterality: N/A;   COLONOSCOPY     CORONARY ANGIOPLASTY WITH STENT PLACEMENT     CORONARY ARTERY BYPASS GRAFT     FINE NEEDLE ASPIRATION  05/04/2021   Procedure: FINE NEEDLE ASPIRATION (FNA) LINEAR;  Surgeon: Garner Nash, DO;   Location: [facility];  Service: Pulmonary;;   LEFT HEART CATH AND CORS/GRAFTS ANGIOGRAPHY N/A 03/20/2018   Procedure: LEFT HEART CATH AND CORS/GRAFTS ANGIOGRAPHY;  Surgeon: Martinique, Peter M, MD;  Location: [place] [facility];  Service: Cardiovascular;  Laterality: N/A;   PERIPHERAL VASCULAR CATHETERIZATION N/A 07/15/2015   Procedure: Abdominal Aortogram w/Lower Extremity;  Surgeon: Wellington Hampshire, MD;  Location: [place] [facility];  Service: Cardiovascular;  Laterality: N/A;   PERIPHERAL VASCULAR CATHETERIZATION Bilateral 07/15/2015   Procedure: Peripheral Vascular Intervention;  Surgeon: Wellington Hampshire, MD;  Location: [facility];  Service: Cardiovascular;  Laterality: Bilateral;  Common Iliacs   SHOULDER ARTHROSCOPY WITH ROTATOR CUFF REPAIR AND SUBACROMIAL DECOMPRESSION Left 05/23/2013   Procedure: LEFT SHOULDER ARTHROSCOPY SUBACROMIAL  DECOMPRESSION DISTAL CLAVICLE RESECTION AND ROTATOR CUFF REPAIR ;  Surgeon: Marin Shutter, MD;  Location: [facility];  Service: Orthopedics;  Laterality: Left;   SHOULDER SURGERY Right 7-8 years ago   Dr. Dorna Leitz   VIDEO BRONCHOSCOPY WITH ENDOBRONCHIAL ULTRASOUND Bilateral 05/04/2021   Procedure: VIDEO BRONCHOSCOPY WITH ENDOBRONCHIAL ULTRASOUND;  Surgeon: Garner Nash, DO;  Location: [facility];  Service: Pulmonary;  Laterality: Bilateral;    REVIEW OF SYSTEMS:   Review of Systems  Constitutional: Negative for appetite change, chills, fatigue, fever and unexpected weight change.  HENT: Positive for mouth/gum soreness.  Negative for nosebleeds, sore throat and trouble swallowing.   Eyes: Negative for eye problems and icterus.  Respiratory: Positive for similar cough  (present for over 1 year ).  Positive for dyspnea on exertion.  Negative for hemoptysis and wheezing.   Cardiovascular: Negative for chest pain and leg swelling.  Gastrointestinal: Positive for mild nausea.  Positive for intermittent diarrhea and constipation.  Negative for abdominal  pain and vomiting.  Genitourinary: Negative for bladder incontinence, difficulty urinating, dysuria, frequency and hematuria.   Musculoskeletal: Negative for back pain, gait problem, neck pain and neck stiffness.  Skin: Negative for itching and rash.  Neurological: Negative for dizziness, extremity weakness, gait problem, headaches, light-headedness and seizures.  Hematological: Negative for adenopathy. Does not bruise/bleed easily.  Psychiatric/Behavioral: Negative for confusion, depression and sleep disturbance. The patient is not nervous/anxious.  PHYSICAL EXAMINATION:  There were no vitals taken for this visit.  ECOG PERFORMANCE STATUS: 1  Physical Exam  Constitutional: Oriented to person, place, and time and well-developed, well-nourished, and in no distress.  HENT:  Head: Normocephalic and atraumatic.  Mouth/Throat: Oropharynx is clear and moist. No oropharyngeal exudate.  Eyes: Conjunctivae are normal. Right eye exhibits no discharge. Left eye exhibits no discharge. No scleral icterus.  Neck: Normal range of motion. Neck supple.  Cardiovascular: Normal rate, regular rhythm, normal heart sounds and intact distal pulses.   Pulmonary/Chest: Effort normal and breath sounds normal. Mild rhonchi noted in left lung.  No respiratory distress. No rales.  Abdominal: Soft. Bowel sounds are normal. Exhibits no distension and no mass. There is no tenderness.  Musculoskeletal: Normal range of motion. Exhibits no edema.  Lymphadenopathy:    No cervical adenopathy.  Neurological: Alert and oriented to person, place, and time. Exhibits normal muscle tone. Gait normal. Coordination normal.  Ambulates with a cane.  Skin: Skin is warm and dry. No rash noted. Not diaphoretic. No erythema. No pallor.  Psychiatric: Mood, memory and judgment normal.  Vitals reviewed.  LABORATORY DATA: Lab Results  Component Value Date   WBC 9.8 06/10/2021   HGB 11.3 (L) 06/10/2021   HCT 31.9 (L) 06/10/2021    MCV 88.9 06/10/2021   PLT 192 06/10/2021      Chemistry      Component Value Date/Time   NA 130 (L) 06/10/2021 0826   NA 127 (L) 03/24/2021 1025   K 4.5 06/10/2021 0826   CL 96 (L) 06/10/2021 0826   CO2 23 06/10/2021 0826   BUN 16 06/10/2021 0826   BUN 13 03/24/2021 1025   CREATININE 1.00 06/10/2021 0826   CREATININE 0.88 11/13/2015 1131      Component Value Date/Time   CALCIUM 10.0 06/10/2021 0826   ALKPHOS 72 06/10/2021 0826   AST 23 06/10/2021 0826   ALT 13 06/10/2021 0826   BILITOT 0.7 06/10/2021 0826       RADIOGRAPHIC STUDIES:  MR BRAIN W WO CONTRAST  Result Date: 06/01/2021 CLINICAL DATA:  Non-small cell lung cancer, staging. Malignant neoplasm of unspecified part of bronchus or lung. EXAM: MRI HEAD WITHOUT AND WITH CONTRAST TECHNIQUE: Multiplanar, multiecho pulse sequences of the brain and surrounding structures were obtained without and with intravenous contrast. CONTRAST:  53m GADAVIST GADOBUTROL 1 MMOL/ML IV SOLN COMPARISON:  None Available. FINDINGS: Brain: Mild age related volume loss. No evidence of acute or subacute infarction. No evidence of metastatic disease. Mild chronic small-vessel ischemic change of the cerebral hemispheric white matter. No cortical or large vessel territory infarction. No mass, hemorrhage, hydrocephalus or extra-axial collection. No abnormal contrast enhancement occurs. Vascular: Major vessels at the base of the brain show flow. Skull and upper cervical spine: Negative Sinuses/Orbits: Clear/normal Other: None IMPRESSION: No evidence of metastatic disease. No acute intracranial finding. Mild chronic small-vessel ischemic change of the cerebral hemispheric white matter. Electronically Signed   By: MNelson ChimesM.D.   On: 06/01/2021 07:55   NM PET Image Initial (PI) Skull Base To Thigh (F-18 FDG)  Result Date: 05/29/2021 CLINICAL DATA:  Initial treatment strategy for non-small lung cancer. Adenocarcinoma on bronchoscopy with station 7  involvement. EXAM: NUCLEAR MEDICINE PET SKULL BASE TO THIGH TECHNIQUE: 7.51 mCi F-18 FDG was injected intravenously. Full-ring PET imaging was performed from the skull base to thigh after the radiotracer. CT data was obtained and used for attenuation correction and anatomic localization. Fasting blood glucose: 132 mg/dl  COMPARISON:  Chest CT 04/30/2021 and 02/05/2021 FINDINGS: Mediastinal blood pool activity: SUV max 2.1 NECK: No hypermetabolic cervical lymph nodes are identified.There are no lesions of the pharyngeal mucosal space. Incidental CT findings: Severe bilateral carotid atherosclerosis. CHEST: There are small nonspecific mildly hypermetabolic mediastinal and hilar lymph nodes bilaterally. Representative nodes include inferior right hilar node with an SUV max of 4.0, a left hilar node with an SUV max of 3.8 and a subcarinal node with an SUV max of 3.8. There is heterogeneous low level hypermetabolic activity throughout the previously demonstrated left lower lobe consolidation (greatest SUV max 5.3 superomedially). No hypermetabolic activity or suspicious pulmonary nodularity within the left upper lobe or the right lung. Incidental CT findings: Stable small left pleural effusion without significant hypermetabolic activity. Diffuse atherosclerosis of the aorta, great vessels and coronary arteries status post median sternotomy and CABG. There are calcifications of the aortic valve. The heart size is at the upper limits of normal. Centrilobular and paraseptal emphysema noted. ABDOMEN/PELVIS: There is no hypermetabolic activity within the liver, adrenal glands, spleen or pancreas. There is no hypermetabolic nodal activity. Scattered bowel activity within physiologic limits. Incidental CT findings: Small calcified gallstones. Diffuse aortic and branch vessel atherosclerosis. SKELETON: There is no hypermetabolic activity to suggest osseous metastatic disease. Incidental CT findings: Multilevel spondylosis.  IMPRESSION: 1. Nonspecific low-level hypermetabolic activity within the progressive mass-like consolidation in the left lower lobe corresponding with known adenocarcinoma on bronchoscopic biopsy/brushings. No suspicious pulmonary metabolic activity outside of the left lower lobe. 2. Nonspecific small mildly hypermetabolic mediastinal and hilar lymph nodes bilaterally which are suspicious for early nodal metastases given previous positive subcarinal nodal biopsy. 3. No evidence of distant metastatic disease. 4. Cholelithiasis. Coronary and aortic atherosclerosis (ICD10-I70.0). Emphysema (ICD10-J43.9). Electronically Signed   By: Richardean Sale M.D.   On: 05/29/2021 11:47     ASSESSMENT/PLAN:  This is a very pleasant 71 year old Caucasian male diagnosed with stage IIIb (T4, N0/N2, M0) non-small cell lung cancer, adenocarcinoma.  The patient presented with a large left lower lobe lung mass with suspicious mediastinal lymphadenopathy.  He was diagnosed in May 2023.  His PD-L1 expression is 2% and he has no actionable mutations by [employer].  The patient is currently undergoing induction chemotherapy for 2-3 cycles with carboplatin for an AUC of 5 and Alimta 500 mg per metered squared IV every 3 weeks.  The goal of treatment is to decrease the volume of the disease in order to be radiated in the near future.  The patient is status post 1 cycle and he tolerated it fairly well except for mouth sores.  Labs were reviewed.  His total white blood cell count is 1.9 and his ANC 0.8.  I reviewed neutropenic precautions with the patient.  We will need to continue to monitor this closely on a weekly basis.  I did encourage the patient to consider waiting in the lobby for 10 minutes on his weekly lab draws to ensure that this number improves at his next lab draw since he drives over an hour away.  Denies any signs or symptoms of infection at this time.  Encouraged him to call us if he develops any fever, chills, nasal  congestion, sore throat, worsening shortness of breath, changes in his cough, skin infections, abdominal pain, diarrhea, or dysuria.    I sent a prescription for Magic mouthwash to the patient's pharmacy for his mouth sores.  He was also encouraged to have good oral hygiene, use salt water rinses, and Biotene if  needed.  We will see him back for follow-up visit in 2 weeks for evaluation and repeat blood work before undergoing cycle #2.  The patient was advised to call immediately if she has any concerning symptoms in the interval. The patient voices understanding of current disease status and treatment options and is in agreement with the current care plan. All questions were answered. The patient knows to call the clinic with any problems, questions or concerns. We can certainly see the patient much sooner if necessary       No orders of the defined types were placed in this encounter.     The total time spent in the appointment was 20-29 minutes.   Azariya Freeman L Yukari Flax, PA-C 06/15/21

## 2021-06-16 ENCOUNTER — Other Ambulatory Visit: Payer: Self-pay | Admitting: Physician Assistant

## 2021-06-16 ENCOUNTER — Other Ambulatory Visit: Payer: Self-pay | Admitting: Medical Oncology

## 2021-06-16 DIAGNOSIS — Z5111 Encounter for antineoplastic chemotherapy: Secondary | ICD-10-CM

## 2021-06-16 DIAGNOSIS — C3492 Malignant neoplasm of unspecified part of left bronchus or lung: Secondary | ICD-10-CM

## 2021-06-17 ENCOUNTER — Other Ambulatory Visit: Payer: Self-pay

## 2021-06-17 ENCOUNTER — Inpatient Hospital Stay: Payer: Medicare PPO

## 2021-06-17 ENCOUNTER — Inpatient Hospital Stay (HOSPITAL_BASED_OUTPATIENT_CLINIC_OR_DEPARTMENT_OTHER): Payer: Medicare PPO | Admitting: Physician Assistant

## 2021-06-17 ENCOUNTER — Encounter: Payer: Self-pay | Admitting: Internal Medicine

## 2021-06-17 ENCOUNTER — Encounter: Payer: Self-pay | Admitting: Physician Assistant

## 2021-06-17 VITALS — BP 119/84 | HR 90 | Temp 97.7°F | Resp 18 | Wt 158.7 lb

## 2021-06-17 DIAGNOSIS — R197 Diarrhea, unspecified: Secondary | ICD-10-CM | POA: Diagnosis not present

## 2021-06-17 DIAGNOSIS — Z5111 Encounter for antineoplastic chemotherapy: Secondary | ICD-10-CM | POA: Diagnosis not present

## 2021-06-17 DIAGNOSIS — C3432 Malignant neoplasm of lower lobe, left bronchus or lung: Secondary | ICD-10-CM | POA: Diagnosis not present

## 2021-06-17 DIAGNOSIS — D701 Agranulocytosis secondary to cancer chemotherapy: Secondary | ICD-10-CM | POA: Diagnosis not present

## 2021-06-17 DIAGNOSIS — Z79899 Other long term (current) drug therapy: Secondary | ICD-10-CM | POA: Diagnosis not present

## 2021-06-17 DIAGNOSIS — K1379 Other lesions of oral mucosa: Secondary | ICD-10-CM | POA: Diagnosis not present

## 2021-06-17 DIAGNOSIS — C3492 Malignant neoplasm of unspecified part of left bronchus or lung: Secondary | ICD-10-CM

## 2021-06-17 DIAGNOSIS — E1165 Type 2 diabetes mellitus with hyperglycemia: Secondary | ICD-10-CM | POA: Diagnosis not present

## 2021-06-17 LAB — CBC WITH DIFFERENTIAL (CANCER CENTER ONLY)
Abs Immature Granulocytes: 0.04 10*3/uL (ref 0.00–0.07)
Basophils Absolute: 0 10*3/uL (ref 0.0–0.1)
Basophils Relative: 1 %
Eosinophils Absolute: 0 10*3/uL (ref 0.0–0.5)
Eosinophils Relative: 1 %
HCT: 29.4 % — ABNORMAL LOW (ref 39.0–52.0)
Hemoglobin: 10.3 g/dL — ABNORMAL LOW (ref 13.0–17.0)
Immature Granulocytes: 2 %
Lymphocytes Relative: 53 %
Lymphs Abs: 1 10*3/uL (ref 0.7–4.0)
MCH: 31.2 pg (ref 26.0–34.0)
MCHC: 35 g/dL (ref 30.0–36.0)
MCV: 89.1 fL (ref 80.0–100.0)
Monocytes Absolute: 0 10*3/uL — ABNORMAL LOW (ref 0.1–1.0)
Monocytes Relative: 1 %
Neutro Abs: 0.8 10*3/uL — ABNORMAL LOW (ref 1.7–7.7)
Neutrophils Relative %: 42 %
Platelet Count: 93 10*3/uL — ABNORMAL LOW (ref 150–400)
RBC: 3.3 MIL/uL — ABNORMAL LOW (ref 4.22–5.81)
RDW: 13.8 % (ref 11.5–15.5)
WBC Count: 1.9 10*3/uL — ABNORMAL LOW (ref 4.0–10.5)
nRBC: 0 % (ref 0.0–0.2)

## 2021-06-17 LAB — CMP (CANCER CENTER ONLY)
ALT: 23 U/L (ref 0–44)
AST: 26 U/L (ref 15–41)
Albumin: 3.6 g/dL (ref 3.5–5.0)
Alkaline Phosphatase: 73 U/L (ref 38–126)
Anion gap: 7 (ref 5–15)
BUN: 16 mg/dL (ref 8–23)
CO2: 26 mmol/L (ref 22–32)
Calcium: 9 mg/dL (ref 8.9–10.3)
Chloride: 97 mmol/L — ABNORMAL LOW (ref 98–111)
Creatinine: 0.99 mg/dL (ref 0.61–1.24)
GFR, Estimated: >60 mL/min (ref >60)
Glucose, Bld: 171 mg/dL — ABNORMAL HIGH (ref 70–99)
Potassium: 4.7 mmol/L (ref 3.5–5.1)
Sodium: 130 mmol/L — ABNORMAL LOW (ref 135–145)
Total Bilirubin: 0.9 mg/dL (ref 0.3–1.2)
Total Protein: 6.7 g/dL (ref 6.5–8.1)

## 2021-06-17 MED ORDER — MAGIC MOUTHWASH: 5.0000 mL | Freq: Four times a day (QID) | ORAL | 1 refills | Status: DC | PRN

## 2021-06-17 MED ORDER — MAGIC MOUTHWASH: 5.0000 mL | Freq: Four times a day (QID) | ORAL | 1 refills | Status: AC | PRN

## 2021-06-17 NOTE — Progress Notes (Signed)
Patient approved for one-time $1000 [employer] grant to assist with personal expenses while going through treatment.  He signed approval letter today at registration and received a copy of the approval letter and expense sheet along with the [facility] OP pharmacy information. He received a gift card today from his grant.  He has my card for any additional financial questions or concerns.

## 2021-06-19 ENCOUNTER — Encounter: Payer: Self-pay | Admitting: Interventional Cardiology

## 2021-06-24 ENCOUNTER — Other Ambulatory Visit: Payer: Self-pay | Admitting: Physician Assistant

## 2021-06-24 ENCOUNTER — Telehealth: Payer: Self-pay | Admitting: Medical Oncology

## 2021-06-24 ENCOUNTER — Encounter (HOSPITAL_COMMUNITY): Payer: Self-pay

## 2021-06-24 ENCOUNTER — Other Ambulatory Visit: Payer: Self-pay

## 2021-06-24 ENCOUNTER — Inpatient Hospital Stay: Payer: Medicare PPO

## 2021-06-24 ENCOUNTER — Ambulatory Visit (HOSPITAL_COMMUNITY)
Admission: RE | Admit: 2021-06-24 | Discharge: 2021-06-24 | Disposition: A | Discharge status: Home / Self Care | Payer: Medicare PPO | Source: Ambulatory Visit | Attending: Physician Assistant | Admitting: Physician Assistant

## 2021-06-24 ENCOUNTER — Inpatient Hospital Stay (HOSPITAL_BASED_OUTPATIENT_CLINIC_OR_DEPARTMENT_OTHER): Payer: Medicare PPO | Admitting: Physician Assistant

## 2021-06-24 ENCOUNTER — Inpatient Hospital Stay (HOSPITAL_COMMUNITY): Payer: Medicare PPO

## 2021-06-24 ENCOUNTER — Inpatient Hospital Stay (HOSPITAL_COMMUNITY)
Admission: EM | Admit: 2021-06-24 | Discharge: 2021-06-27 | DRG: 291 | Disposition: A | Discharge status: Home Health | Payer: Medicare PPO | Source: Ambulatory Visit | Attending: Internal Medicine | Admitting: Internal Medicine

## 2021-06-24 VITALS — BP 118/72 | HR 75 | Temp 97.8°F | Resp 18

## 2021-06-24 DIAGNOSIS — Z8701 Personal history of pneumonia (recurrent): Secondary | ICD-10-CM

## 2021-06-24 DIAGNOSIS — N179 Acute kidney failure, unspecified: Secondary | ICD-10-CM | POA: Diagnosis not present

## 2021-06-24 DIAGNOSIS — K8020 Calculus of gallbladder without cholecystitis without obstruction: Secondary | ICD-10-CM | POA: Diagnosis not present

## 2021-06-24 DIAGNOSIS — I252 Old myocardial infarction: Secondary | ICD-10-CM | POA: Diagnosis not present

## 2021-06-24 DIAGNOSIS — D702 Other drug-induced agranulocytosis: Secondary | ICD-10-CM

## 2021-06-24 DIAGNOSIS — E1165 Type 2 diabetes mellitus with hyperglycemia: Secondary | ICD-10-CM | POA: Diagnosis present

## 2021-06-24 DIAGNOSIS — D6481 Anemia due to antineoplastic chemotherapy: Secondary | ICD-10-CM

## 2021-06-24 DIAGNOSIS — D61818 Other pancytopenia: Secondary | ICD-10-CM

## 2021-06-24 DIAGNOSIS — R051 Acute cough: Secondary | ICD-10-CM

## 2021-06-24 DIAGNOSIS — I255 Ischemic cardiomyopathy: Secondary | ICD-10-CM | POA: Diagnosis present

## 2021-06-24 DIAGNOSIS — C3492 Malignant neoplasm of unspecified part of left bronchus or lung: Secondary | ICD-10-CM | POA: Diagnosis present

## 2021-06-24 DIAGNOSIS — T451X5A Adverse effect of antineoplastic and immunosuppressive drugs, initial encounter: Secondary | ICD-10-CM | POA: Diagnosis present

## 2021-06-24 DIAGNOSIS — Z8616 Personal history of COVID-19: Secondary | ICD-10-CM | POA: Diagnosis not present

## 2021-06-24 DIAGNOSIS — Z955 Presence of coronary angioplasty implant and graft: Secondary | ICD-10-CM

## 2021-06-24 DIAGNOSIS — Z7984 Long term (current) use of oral hypoglycemic drugs: Secondary | ICD-10-CM

## 2021-06-24 DIAGNOSIS — E119 Type 2 diabetes mellitus without complications: Secondary | ICD-10-CM

## 2021-06-24 DIAGNOSIS — Z801 Family history of malignant neoplasm of trachea, bronchus and lung: Secondary | ICD-10-CM

## 2021-06-24 DIAGNOSIS — R7989 Other specified abnormal findings of blood chemistry: Secondary | ICD-10-CM

## 2021-06-24 DIAGNOSIS — J90 Pleural effusion, not elsewhere classified: Secondary | ICD-10-CM | POA: Diagnosis not present

## 2021-06-24 DIAGNOSIS — R300 Dysuria: Secondary | ICD-10-CM | POA: Diagnosis present

## 2021-06-24 DIAGNOSIS — Z8249 Family history of ischemic heart disease and other diseases of the circulatory system: Secondary | ICD-10-CM

## 2021-06-24 DIAGNOSIS — E8770 Fluid overload, unspecified: Secondary | ICD-10-CM | POA: Diagnosis not present

## 2021-06-24 DIAGNOSIS — I25810 Atherosclerosis of coronary artery bypass graft(s) without angina pectoris: Secondary | ICD-10-CM | POA: Diagnosis present

## 2021-06-24 DIAGNOSIS — E222 Syndrome of inappropriate secretion of antidiuretic hormone: Secondary | ICD-10-CM | POA: Diagnosis not present

## 2021-06-24 DIAGNOSIS — R739 Hyperglycemia, unspecified: Secondary | ICD-10-CM

## 2021-06-24 DIAGNOSIS — I5043 Acute on chronic combined systolic (congestive) and diastolic (congestive) heart failure: Secondary | ICD-10-CM | POA: Diagnosis not present

## 2021-06-24 DIAGNOSIS — C3432 Malignant neoplasm of lower lobe, left bronchus or lung: Secondary | ICD-10-CM | POA: Diagnosis not present

## 2021-06-24 DIAGNOSIS — E1159 Type 2 diabetes mellitus with other circulatory complications: Secondary | ICD-10-CM | POA: Diagnosis not present

## 2021-06-24 DIAGNOSIS — I25119 Atherosclerotic heart disease of native coronary artery with unspecified angina pectoris: Secondary | ICD-10-CM | POA: Diagnosis present

## 2021-06-24 DIAGNOSIS — Z1152 Encounter for screening for COVID-19: Secondary | ICD-10-CM | POA: Diagnosis not present

## 2021-06-24 DIAGNOSIS — D709 Neutropenia, unspecified: Secondary | ICD-10-CM | POA: Diagnosis not present

## 2021-06-24 DIAGNOSIS — E876 Hypokalemia: Secondary | ICD-10-CM | POA: Diagnosis present

## 2021-06-24 DIAGNOSIS — Z7902 Long term (current) use of antithrombotics/antiplatelets: Secondary | ICD-10-CM | POA: Diagnosis not present

## 2021-06-24 DIAGNOSIS — K716 Toxic liver disease with hepatitis, not elsewhere classified: Secondary | ICD-10-CM | POA: Diagnosis not present

## 2021-06-24 DIAGNOSIS — Z79899 Other long term (current) drug therapy: Secondary | ICD-10-CM

## 2021-06-24 DIAGNOSIS — Z87891 Personal history of nicotine dependence: Secondary | ICD-10-CM

## 2021-06-24 DIAGNOSIS — R0602 Shortness of breath: Secondary | ICD-10-CM | POA: Diagnosis present

## 2021-06-24 DIAGNOSIS — Z885 Allergy status to narcotic agent status: Secondary | ICD-10-CM

## 2021-06-24 DIAGNOSIS — I110 Hypertensive heart disease with heart failure: Secondary | ICD-10-CM | POA: Diagnosis not present

## 2021-06-24 DIAGNOSIS — T50905A Adverse effect of unspecified drugs, medicaments and biological substances, initial encounter: Secondary | ICD-10-CM | POA: Diagnosis not present

## 2021-06-24 DIAGNOSIS — K761 Chronic passive congestion of liver: Secondary | ICD-10-CM | POA: Diagnosis present

## 2021-06-24 DIAGNOSIS — I5023 Acute on chronic systolic (congestive) heart failure: Secondary | ICD-10-CM | POA: Diagnosis not present

## 2021-06-24 DIAGNOSIS — Z7982 Long term (current) use of aspirin: Secondary | ICD-10-CM | POA: Diagnosis not present

## 2021-06-24 DIAGNOSIS — D61810 Antineoplastic chemotherapy induced pancytopenia: Secondary | ICD-10-CM | POA: Diagnosis not present

## 2021-06-24 DIAGNOSIS — Z7951 Long term (current) use of inhaled steroids: Secondary | ICD-10-CM | POA: Diagnosis not present

## 2021-06-24 DIAGNOSIS — I2582 Chronic total occlusion of coronary artery: Secondary | ICD-10-CM | POA: Diagnosis present

## 2021-06-24 DIAGNOSIS — E1151 Type 2 diabetes mellitus with diabetic peripheral angiopathy without gangrene: Secondary | ICD-10-CM | POA: Diagnosis present

## 2021-06-24 DIAGNOSIS — K1231 Oral mucositis (ulcerative) due to antineoplastic therapy: Secondary | ICD-10-CM | POA: Diagnosis present

## 2021-06-24 DIAGNOSIS — C3490 Malignant neoplasm of unspecified part of unspecified bronchus or lung: Secondary | ICD-10-CM | POA: Diagnosis not present

## 2021-06-24 DIAGNOSIS — R778 Other specified abnormalities of plasma proteins: Secondary | ICD-10-CM | POA: Diagnosis present

## 2021-06-24 DIAGNOSIS — R059 Cough, unspecified: Secondary | ICD-10-CM | POA: Diagnosis not present

## 2021-06-24 DIAGNOSIS — I2510 Atherosclerotic heart disease of native coronary artery without angina pectoris: Secondary | ICD-10-CM | POA: Diagnosis not present

## 2021-06-24 DIAGNOSIS — Z888 Allergy status to other drugs, medicaments and biological substances status: Secondary | ICD-10-CM

## 2021-06-24 DIAGNOSIS — K828 Other specified diseases of gallbladder: Secondary | ICD-10-CM | POA: Diagnosis not present

## 2021-06-24 DIAGNOSIS — E785 Hyperlipidemia, unspecified: Secondary | ICD-10-CM | POA: Diagnosis present

## 2021-06-24 LAB — COMPREHENSIVE METABOLIC PANEL
ALT: 677 U/L — ABNORMAL HIGH (ref 0–44)
AST: 515 U/L — ABNORMAL HIGH (ref 15–41)
Albumin: 3.1 g/dL — ABNORMAL LOW (ref 3.5–5.0)
Alkaline Phosphatase: 158 U/L — ABNORMAL HIGH (ref 38–126)
Anion gap: 11 (ref 5–15)
BUN: 25 mg/dL — ABNORMAL HIGH (ref 8–23)
CO2: 21 mmol/L — ABNORMAL LOW (ref 22–32)
Calcium: 8.8 mg/dL — ABNORMAL LOW (ref 8.9–10.3)
Chloride: 95 mmol/L — ABNORMAL LOW (ref 98–111)
Creatinine, Ser: 1.37 mg/dL — ABNORMAL HIGH (ref 0.61–1.24)
GFR, Estimated: 54 mL/min — ABNORMAL LOW (ref >60)
Glucose, Bld: 303 mg/dL — ABNORMAL HIGH (ref 70–99)
Potassium: 4.1 mmol/L (ref 3.5–5.1)
Sodium: 127 mmol/L — ABNORMAL LOW (ref 135–145)
Total Bilirubin: 1.6 mg/dL — ABNORMAL HIGH (ref 0.3–1.2)
Total Protein: 7 g/dL (ref 6.5–8.1)

## 2021-06-24 LAB — CBC WITH DIFFERENTIAL/PLATELET
Abs Immature Granulocytes: 0 10*3/uL (ref 0.00–0.07)
Basophils Absolute: 0 10*3/uL (ref 0.0–0.1)
Basophils Relative: 0 %
Eosinophils Absolute: 0 10*3/uL (ref 0.0–0.5)
Eosinophils Relative: 0 %
HCT: 24.9 % — ABNORMAL LOW (ref 39.0–52.0)
Hemoglobin: 8.7 g/dL — ABNORMAL LOW (ref 13.0–17.0)
Immature Granulocytes: 0 %
Lymphocytes Relative: 56 %
Lymphs Abs: 0.4 10*3/uL — ABNORMAL LOW (ref 0.7–4.0)
MCH: 31.2 pg (ref 26.0–34.0)
MCHC: 34.9 g/dL (ref 30.0–36.0)
MCV: 89.2 fL (ref 80.0–100.0)
Monocytes Absolute: 0.1 10*3/uL (ref 0.1–1.0)
Monocytes Relative: 9 %
Neutro Abs: 0.2 10*3/uL — CL (ref 1.7–7.7)
Neutrophils Relative %: 35 %
Platelets: 57 10*3/uL — ABNORMAL LOW (ref 150–400)
RBC: 2.79 MIL/uL — ABNORMAL LOW (ref 4.22–5.81)
RDW: 13.2 % (ref 11.5–15.5)
WBC: 0.7 10*3/uL — CL (ref 4.0–10.5)
nRBC: 0 % (ref 0.0–0.2)

## 2021-06-24 LAB — PROTIME-INR
INR: 1.4 — ABNORMAL HIGH (ref 0.8–1.2)
Prothrombin Time: 17 seconds — ABNORMAL HIGH (ref 11.4–15.2)

## 2021-06-24 LAB — CMP (CANCER CENTER ONLY)
ALT: 589 U/L (ref 0–44)
AST: 484 U/L (ref 15–41)
Albumin: 3.1 g/dL — ABNORMAL LOW (ref 3.5–5.0)
Alkaline Phosphatase: 140 U/L — ABNORMAL HIGH (ref 38–126)
Anion gap: 8 (ref 5–15)
BUN: 25 mg/dL — ABNORMAL HIGH (ref 8–23)
CO2: 23 mmol/L (ref 22–32)
Calcium: 8.7 mg/dL — ABNORMAL LOW (ref 8.9–10.3)
Chloride: 94 mmol/L — ABNORMAL LOW (ref 98–111)
Creatinine: 1.27 mg/dL — ABNORMAL HIGH (ref 0.61–1.24)
GFR, Estimated: 60 mL/min — ABNORMAL LOW (ref >60)
Glucose, Bld: 327 mg/dL — ABNORMAL HIGH (ref 70–99)
Potassium: 4.5 mmol/L (ref 3.5–5.1)
Sodium: 125 mmol/L — ABNORMAL LOW (ref 135–145)
Total Bilirubin: 1.3 mg/dL — ABNORMAL HIGH (ref 0.3–1.2)
Total Protein: 6.1 g/dL — ABNORMAL LOW (ref 6.5–8.1)

## 2021-06-24 LAB — CBC WITH DIFFERENTIAL (CANCER CENTER ONLY)
Abs Immature Granulocytes: 0.01 10*3/uL (ref 0.00–0.07)
Basophils Absolute: 0 10*3/uL (ref 0.0–0.1)
Basophils Relative: 0 %
Eosinophils Absolute: 0 10*3/uL (ref 0.0–0.5)
Eosinophils Relative: 0 %
HCT: 20.8 % — ABNORMAL LOW (ref 39.0–52.0)
Hemoglobin: 7.5 g/dL — ABNORMAL LOW (ref 13.0–17.0)
Immature Granulocytes: 2 %
Lymphocytes Relative: 64 %
Lymphs Abs: 0.4 10*3/uL — ABNORMAL LOW (ref 0.7–4.0)
MCH: 31.4 pg (ref 26.0–34.0)
MCHC: 36.1 g/dL — ABNORMAL HIGH (ref 30.0–36.0)
MCV: 87 fL (ref 80.0–100.0)
Monocytes Absolute: 0.1 10*3/uL (ref 0.1–1.0)
Monocytes Relative: 11 %
Neutro Abs: 0.1 10*3/uL — CL (ref 1.7–7.7)
Neutrophils Relative %: 23 %
Platelet Count: 40 10*3/uL — ABNORMAL LOW (ref 150–400)
RBC: 2.39 MIL/uL — ABNORMAL LOW (ref 4.22–5.81)
RDW: 13.2 % (ref 11.5–15.5)
WBC Count: 0.6 10*3/uL — CL (ref 4.0–10.5)
nRBC: 0 % (ref 0.0–0.2)

## 2021-06-24 LAB — RESP PANEL BY RT-PCR (FLU A&B, COVID) ARPGX2
Influenza A by PCR: NEGATIVE
Influenza B by PCR: NEGATIVE
SARS Coronavirus 2 by RT PCR: NEGATIVE

## 2021-06-24 LAB — URINALYSIS, ROUTINE W REFLEX MICROSCOPIC
Bacteria, UA: NONE SEEN
Bilirubin Urine: NEGATIVE
Glucose, UA: 150 mg/dL — AB
Ketones, ur: NEGATIVE mg/dL
Leukocytes,Ua: NEGATIVE
Nitrite: NEGATIVE
Protein, ur: NEGATIVE mg/dL
Specific Gravity, Urine: 1.006 (ref 1.005–1.030)
pH: 6 (ref 5.0–8.0)

## 2021-06-24 LAB — PREPARE RBC (CROSSMATCH)

## 2021-06-24 LAB — LACTIC ACID, PLASMA
Lactic Acid, Venous: 2.7 mmol/L (ref 0.5–1.9)
Lactic Acid, Venous: 4.2 mmol/L (ref 0.5–1.9)

## 2021-06-24 LAB — TROPONIN I (HIGH SENSITIVITY)
Troponin I (High Sensitivity): 73 ng/L — ABNORMAL HIGH (ref <18)
Troponin I (High Sensitivity): 68 ng/L — ABNORMAL HIGH (ref <18)

## 2021-06-24 LAB — BRAIN NATRIURETIC PEPTIDE: B Natriuretic Peptide: 2807.6 pg/mL — ABNORMAL HIGH (ref 0.0–100.0)

## 2021-06-24 LAB — GLUCOSE, CAPILLARY: Glucose-Capillary: 238 mg/dL — ABNORMAL HIGH (ref 70–99)

## 2021-06-24 MED ORDER — SODIUM CHLORIDE 0.9 % IV SOLN
2.0000 g | Freq: Once | INTRAVENOUS | Status: AC
  Administered 2021-06-24: 2 g via INTRAVENOUS
  Filled 2021-06-24: qty 12.5

## 2021-06-24 MED ORDER — PROCHLORPERAZINE MALEATE 10 MG PO TABS
10.0000 mg | ORAL_TABLET | Freq: Four times a day (QID) | ORAL | Status: DC | PRN
Start: 2021-06-24 — End: 2021-06-27

## 2021-06-24 MED ORDER — VANCOMYCIN HCL IN DEXTROSE 1-5 GM/200ML-% IV SOLN
1000.0000 mg | Freq: Once | INTRAVENOUS | Status: DC
  Administered 2021-06-24: 1000 mg via INTRAVENOUS
  Filled 2021-06-24: qty 200

## 2021-06-24 MED ORDER — METHYLPREDNISOLONE 4 MG PO TBPK: ORAL_TABLET | ORAL | 0 refills | Status: DC

## 2021-06-24 MED ORDER — CARVEDILOL 6.25 MG PO TABS
6.2500 mg | ORAL_TABLET | Freq: Two times a day (BID) | ORAL | Status: DC
  Administered 2021-06-25 (×2): 6.25 mg via ORAL
  Filled 2021-06-24 (×2): qty 1

## 2021-06-24 MED ORDER — SODIUM CHLORIDE 0.9 % IV SOLN: 2.0000 g | Freq: Once | INTRAVENOUS | Status: DC

## 2021-06-24 MED ORDER — CROMOLYN SODIUM 4 % OP SOLN
1.0000 [drp] | Freq: Every day | OPHTHALMIC | Status: DC
Start: 2021-06-25 — End: 2021-06-27
  Administered 2021-06-25 – 2021-06-27 (×3): 1 [drp] via OPHTHALMIC
  Filled 2021-06-24: qty 10

## 2021-06-24 MED ORDER — AZELASTINE HCL 0.1 % NA SOLN
1.0000 | Freq: Every day | NASAL | Status: DC | PRN
Start: 2021-06-24 — End: 2021-06-27

## 2021-06-24 MED ORDER — MAGIC MOUTHWASH W/LIDOCAINE
5.0000 mL | Freq: Three times a day (TID) | ORAL | Status: DC | PRN
Start: 2021-06-24 — End: 2021-06-27

## 2021-06-24 MED ORDER — INSULIN ASPART 100 UNIT/ML IJ SOLN
5.0000 [IU] | Freq: Once | INTRAMUSCULAR | Status: AC
  Administered 2021-06-24: 5 [IU] via SUBCUTANEOUS
  Filled 2021-06-24: qty 1

## 2021-06-24 MED ORDER — ISOSORBIDE MONONITRATE ER 30 MG PO TB24
15.0000 mg | ORAL_TABLET | Freq: Every day | ORAL | Status: DC
  Administered 2021-06-25 – 2021-06-27 (×3): 15 mg via ORAL
  Filled 2021-06-24 (×3): qty 1

## 2021-06-24 MED ORDER — TBO-FILGRASTIM 300 MCG/0.5ML ~~LOC~~ SOSY
300.0000 ug | PREFILLED_SYRINGE | Freq: Every day | SUBCUTANEOUS | Status: DC
  Administered 2021-06-25: 300 ug via SUBCUTANEOUS
  Filled 2021-06-24 (×2): qty 0.5

## 2021-06-24 MED ORDER — SACUBITRIL-VALSARTAN 97-103 MG PO TABS
1.0000 | ORAL_TABLET | Freq: Two times a day (BID) | ORAL | Status: DC
  Administered 2021-06-24 – 2021-06-27 (×6): 1 via ORAL
  Filled 2021-06-24 (×6): qty 1

## 2021-06-24 MED ORDER — INSULIN ASPART 100 UNIT/ML IJ SOLN
0.0000 [IU] | Freq: Three times a day (TID) | INTRAMUSCULAR | Status: DC
  Administered 2021-06-25: 3 [IU] via SUBCUTANEOUS
  Administered 2021-06-25: 5 [IU] via SUBCUTANEOUS
  Administered 2021-06-25: 2 [IU] via SUBCUTANEOUS
  Filled 2021-06-24: qty 0.09

## 2021-06-24 MED ORDER — FOLIC ACID 1 MG PO TABS
1.0000 mg | ORAL_TABLET | Freq: Every day | ORAL | Status: DC
  Administered 2021-06-25 – 2021-06-27 (×3): 1 mg via ORAL
  Filled 2021-06-24 (×3): qty 1

## 2021-06-24 MED ORDER — FUROSEMIDE 10 MG/ML IJ SOLN
60.0000 mg | Freq: Two times a day (BID) | INTRAMUSCULAR | Status: DC
  Administered 2021-06-25 – 2021-06-26 (×4): 60 mg via INTRAVENOUS
  Filled 2021-06-24 (×4): qty 6

## 2021-06-24 MED ORDER — ALPRAZOLAM 0.25 MG PO TABS
0.2500 mg | ORAL_TABLET | Freq: Three times a day (TID) | ORAL | Status: DC | PRN
Start: 2021-06-24 — End: 2021-06-27
  Administered 2021-06-24 – 2021-06-26 (×3): 0.25 mg via ORAL
  Filled 2021-06-24 (×3): qty 1

## 2021-06-24 MED ORDER — ISOSORBIDE MONONITRATE ER 30 MG PO TB24: 30.0000 mg | ORAL_TABLET | Freq: Every day | ORAL | Status: DC

## 2021-06-24 MED ORDER — FLUTICASONE PROPIONATE 50 MCG/ACT NA SUSP
2.0000 | Freq: Every day | NASAL | Status: DC | PRN
Start: 2021-06-24 — End: 2021-06-27

## 2021-06-24 MED ORDER — POLYVINYL ALCOHOL-POVIDONE 5-6 MG/ML OP SOLN: Freq: Every day | OPHTHALMIC | Status: DC | PRN

## 2021-06-24 MED ORDER — FLUTICASONE FUROATE-VILANTEROL 100-25 MCG/ACT IN AEPB
1.0000 | INHALATION_SPRAY | Freq: Every day | RESPIRATORY_TRACT | Status: DC
  Administered 2021-06-25 – 2021-06-27 (×3): 1 via RESPIRATORY_TRACT
  Filled 2021-06-24: qty 28

## 2021-06-24 MED ORDER — POLYVINYL ALCOHOL 1.4 % OP SOLN: 1.0000 [drp] | Freq: Every day | OPHTHALMIC | Status: DC | PRN

## 2021-06-24 MED ORDER — UMECLIDINIUM BROMIDE 62.5 MCG/ACT IN AEPB
1.0000 | INHALATION_SPRAY | Freq: Every day | RESPIRATORY_TRACT | Status: DC
  Administered 2021-06-25 – 2021-06-27 (×3): 1 via RESPIRATORY_TRACT
  Filled 2021-06-24: qty 7

## 2021-06-24 MED ORDER — LACTATED RINGERS IV BOLUS (SEPSIS): 1000.0000 mL | Freq: Once | INTRAVENOUS | Status: DC

## 2021-06-24 MED ORDER — LACTATED RINGERS IV SOLN: INTRAVENOUS | Status: DC

## 2021-06-24 MED ORDER — NITROGLYCERIN 0.4 MG SL SUBL
0.4000 mg | SUBLINGUAL_TABLET | SUBLINGUAL | Status: DC | PRN
  Administered 2021-06-25: 0.4 mg via SUBLINGUAL
  Filled 2021-06-24 (×2): qty 1

## 2021-06-24 MED ORDER — FILGRASTIM-SNDZ 300 MCG/0.5ML IJ SOSY
300.0000 ug | PREFILLED_SYRINGE | Freq: Once | INTRAMUSCULAR | Status: AC
  Administered 2021-06-24: 300 ug via SUBCUTANEOUS
  Filled 2021-06-24: qty 0.5

## 2021-06-24 MED ORDER — RANOLAZINE ER 500 MG PO TB12
500.0000 mg | ORAL_TABLET | Freq: Two times a day (BID) | ORAL | Status: DC
  Administered 2021-06-24 – 2021-06-27 (×6): 500 mg via ORAL
  Filled 2021-06-24 (×6): qty 1

## 2021-06-24 MED ORDER — FUROSEMIDE 10 MG/ML IJ SOLN
60.0000 mg | Freq: Once | INTRAMUSCULAR | Status: AC
  Administered 2021-06-24: 60 mg via INTRAVENOUS
  Filled 2021-06-24: qty 8

## 2021-06-24 NOTE — Progress Notes (Signed)
A consult was received from an ED physician for vancomycin and cefepime per pharmacy dosing.  The patient's profile has been reviewed for ht/wt/allergies/indication/available labs.    I agree with one time orders placed by ED provider for cefepime 2 g IV and vancomycin 1000 mg IV.    Further antibiotics/pharmacy consults should be ordered by admitting physician if indicated.                       Thank you, Suzzanne Cloud, PharmD, BCPS 06/24/2021  6:09 PM

## 2021-06-24 NOTE — ED Triage Notes (Signed)
Sent by [facility] due to abnormal chest X-ray.

## 2021-06-24 NOTE — Telephone Encounter (Signed)
Called report to [facility] and pt was assigned room 22 .   Pt notified.

## 2021-06-24 NOTE — Patient Instructions (Signed)
Filgrastim, G-CSF injection What is this medication? FILGRASTIM, G-CSF (fil GRA stim) is a granulocyte colony-stimulating factor that stimulates the growth of neutrophils, a type of white blood cell (WBC) important in the body's fight against infection. It is used to reduce the incidence of fever and infection in patients with certain types of cancer who are receiving chemotherapy that affects the bone marrow, to stimulate blood cell production for removal of WBCs from the body prior to a bone marrow transplantation, to reduce the incidence of fever and infection in patients who have severe chronic neutropenia, and to improve survival outcomes following high-dose radiation exposure that is toxic to the bone marrow. This medicine may be used for other purposes; ask your health care provider or pharmacist if you have questions. COMMON BRAND NAME(S): Neupogen, Nivestym, Releuko, Zarxio What should I tell my care team before I take this medication? They need to know if you have any of these conditions: kidney disease latex allergy ongoing radiation therapy sickle cell disease an unusual or allergic reaction to filgrastim, pegfilgrastim, other medicines, foods, dyes, or preservatives pregnant or trying to get pregnant breast-feeding How should I use this medication? This medicine is for injection under the skin or infusion into a vein. As an infusion into a vein, it is usually given by a health care professional in a hospital or clinic setting. If you get this medicine at home, you will be taught how to prepare and give this medicine. Refer to the Instructions for Use that come with your medication packaging. Use exactly as directed. Take your medicine at regular intervals. Do not take your medicine more often than directed. It is important that you put your used needles and syringes in a special sharps container. Do not put them in a trash can. If you do not have a sharps container, call your pharmacist  or healthcare provider to get one. Talk to your pediatrician regarding the use of this medicine in children. While this drug may be prescribed for children as young as 7 months for selected conditions, precautions do apply. Overdosage: If you think you have taken too much of this medicine contact a poison control center or emergency room at once. NOTE: This medicine is only for you. Do not share this medicine with others. What if I miss a dose? It is important not to miss your dose. Call your doctor or health care professional if you miss a dose. What may interact with this medication? This medicine may interact with the following medications: medicines that may cause a release of neutrophils, such as lithium This list may not describe all possible interactions. Give your health care provider a list of all the medicines, herbs, non-prescription drugs, or dietary supplements you use. Also tell them if you smoke, drink alcohol, or use illegal drugs. Some items may interact with your medicine. What should I watch for while using this medication? Your condition will be monitored carefully while you are receiving this medicine. You may need blood work done while you are taking this medicine. Talk to your health care provider about your risk of cancer. You may be more at risk for certain types of cancer if you take this medicine. What side effects may I notice from receiving this medication? Side effects that you should report to your doctor or health care professional as soon as possible: allergic reactions like skin rash, itching or hives, swelling of the face, lips, or tongue back pain dizziness or feeling faint fever pain, redness, or  irritation at site where injected pinpoint red spots on the skin shortness of breath or breathing problems signs and symptoms of kidney injury like trouble passing urine, change in the amount of urine, or red or dark-brown urine stomach or side pain, or pain at  the shoulder swelling tiredness unusual bleeding or bruising Side effects that usually do not require medical attention (report to your doctor or health care professional if they continue or are bothersome): bone pain cough diarrhea hair loss headache muscle pain This list may not describe all possible side effects. Call your doctor for medical advice about side effects. You may report side effects to FDA at 1-800-FDA-1088. Where should I keep my medication? Keep out of the reach of children. Store in a refrigerator between 2 and 8 degrees C (36 and 46 degrees F). Do not freeze. Keep in carton to protect from light. Throw away this medicine if vials or syringes are left out of the refrigerator for more than 24 hours. Throw away any unused medicine after the expiration date. NOTE: This sheet is a summary. It may not cover all possible information. If you have questions about this medicine, talk to your doctor, pharmacist, or health care provider.  2023 Elsevier/Gold Standard (2020-11-20 00:00:00)

## 2021-06-24 NOTE — H&P (Addendum)
History and Physical    [name] [name] LOV:[id number] DOB: 1950/07/06 DOA: 06/24/2021  PCP: [name] Orn, MD Patient coming from: sent to ED by oncologist   I have personally briefly reviewed patient's old medical records in [place]  Chief Complaint: abnormal labs and cxr  HPI: [name] [name] is a 71 y.o. male with medical history significant of CAD s/p CABG, systolic  chf, NIDDM2, recently diagnosed stage III b adenocarcinoma of the lung, received first of a chemotherapy on 6 /8 with carbo/alimta, seen by oncology today, found to have neutropenia, anemia and thrombocytopenia, hyponatremia ,abnormal cr, LFT ,abnormal chest x-ray, he was sent to ED for further evaluation, he reports  cough, sob, weight gain, lower extremity edema, denies fever, reports developed mouth sores after chemo. Cxr: 1. Cardiomegaly with diffuse bilateral interstitial pulmonary opacity, consistent with edema or infection. 2. Small right pleural effusion, increased compared to prior examination.  ED Course:   Data reviewed: Blood pressure (!) 145/85, pulse 85, temperature 97.8 F (36.6 C), temperature source Oral, resp. rate 18, SpO2 99 %. EDP ordered additional labs and called hospitalist to admit the patient I requested iv lasix to be given in the ED   Review of Systems: As per HPI otherwise all other systems reviewed and are negative.   Past Medical History:  Diagnosis Date   Anxiety    Arthritis    Chest pain    a. experience weekly, nitrate responsive c/p x several years.   b. ranexa added on 08/2014 admission    Chronic combined systolic and diastolic CHF (congestive heart failure) ([place])    Claudication ([place])    a. 08/2014 ABI: R 0.79, L 1.0-->Walking program instituted.   Coronary artery disease    a. s/p MI in 1989;  b. 04/2008 CABG x 5 (LIMA->LAD, VG->D1, VG->RI, VG->PDA, L Radial->OM2);  c. 06/2010 PCI/DES to the Diag (2.25x28 Promus Element DES). VG->Diag occluded;  d. 08/2010 Attempted PTCA of  the OM2 via the free radial graft. Unsuccessful.  Lesion felt to be 2/2 suture.  c. NSTEMI s/p [facility] with severe native and graph dz; D1 that was stented in 2012 now occulded: Rx therapy recommended   Coronary artery disease involving native coronary artery of native heart with angina pectoris ([place])    a. s/p MI in 1989;  b. 04/2008 CABG x 5 (LIMA->LAD, VG->D1, VG->RI, VG->PDA, L Radial->OM2);  c. 06/2010 PCI/DES to the Diag (2.25x28 Promus Element DES). VG->Diag occluded;  d. 08/2010 Attempted PTCA of the OM2 via the free radial graft. Unsuccessful.  Lesion felt to be 2/2 suture.  c. NSTEMI s/p [facility] with severe native and graph dz; Rx therapy recommended    COVID 2021   mild case   Diabetes mellitus ([place]) 06/02/2011   type 2   Hyperlipidemia    Hypertension    NSTEMI (non-ST elevated myocardial infarction) ([place]) 08/29/2014   Peripheral arterial disease ([place]) 08/16/2014   Pneumonia    Type II diabetes mellitus ([place])     Past Surgical History:  Procedure Laterality Date   BRONCHIAL BIOPSY  05/04/2021   Procedure: BRONCHIAL BIOPSIES;  Surgeon: Garner Nash, DO;  Location: [place] [facility];  Service: Pulmonary;;   BRONCHIAL BRUSHINGS  05/04/2021   Procedure: BRONCHIAL BRUSHINGS;  Surgeon: Garner Nash, DO;  Location: [place] [facility];  Service: Pulmonary;;   BRONCHIAL WASHINGS  05/04/2021   Procedure: BRONCHIAL WASHINGS;  Surgeon: Garner Nash, DO;  Location: [place] [facility];  Service: Pulmonary;;   CARDIAC CATHETERIZATION N/A 08/28/2014  Procedure: Left Heart Cath and Cors/Grafts Angiography;  Surgeon: Belva Crome, MD;  Location: [place] [facility];  Service: Cardiovascular;  Laterality: N/A;   COLONOSCOPY     CORONARY ANGIOPLASTY WITH STENT PLACEMENT     CORONARY ARTERY BYPASS GRAFT     FINE NEEDLE ASPIRATION  05/04/2021   Procedure: FINE NEEDLE ASPIRATION (FNA) LINEAR;  Surgeon: Garner Nash, DO;  Location: [place];  Service: Pulmonary;;   LEFT HEART CATH AND CORS/GRAFTS ANGIOGRAPHY N/A  03/20/2018   Procedure: LEFT HEART CATH AND CORS/GRAFTS ANGIOGRAPHY;  Surgeon: Martinique, Peter M, MD;  Location: [place] [facility];  Service: Cardiovascular;  Laterality: N/A;   PERIPHERAL VASCULAR CATHETERIZATION N/A 07/15/2015   Procedure: Abdominal Aortogram w/Lower Extremity;  Surgeon: Wellington Hampshire, MD;  Location: [place] [facility];  Service: Cardiovascular;  Laterality: N/A;   PERIPHERAL VASCULAR CATHETERIZATION Bilateral 07/15/2015   Procedure: Peripheral Vascular Intervention;  Surgeon: Wellington Hampshire, MD;  Location: [place] [facility];  Service: Cardiovascular;  Laterality: Bilateral;  Common Iliacs   SHOULDER ARTHROSCOPY WITH ROTATOR CUFF REPAIR AND SUBACROMIAL DECOMPRESSION Left 05/23/2013   Procedure: LEFT SHOULDER ARTHROSCOPY SUBACROMIAL  DECOMPRESSION DISTAL CLAVICLE RESECTION AND ROTATOR CUFF REPAIR ;  Surgeon: Marin Shutter, MD;  Location: [facility];  Service: Orthopedics;  Laterality: Left;   SHOULDER SURGERY Right 7-8 years ago   Dr. Dorna Leitz   VIDEO BRONCHOSCOPY WITH ENDOBRONCHIAL ULTRASOUND Bilateral 05/04/2021   Procedure: VIDEO BRONCHOSCOPY WITH ENDOBRONCHIAL ULTRASOUND;  Surgeon: Garner Nash, DO;  Location: [place];  Service: Pulmonary;  Laterality: Bilateral;    Social History  reports that he quit smoking about 34 years ago. His smoking use included cigarettes. He has quit using smokeless tobacco. He reports that he does not currently use alcohol. He reports that he does not use drugs.  Allergies  Allergen Reactions   Aldactone [Spironolactone] Nausea And Vomiting   Amlodipine Swelling and Other (See Comments)    Edema    Farxiga [Dapagliflozin]     Yeast infections   Percocet [Oxycodone-Acetaminophen] Other (See Comments)    "nightmares, stomach problems, and foggy brain"   Trulicity [Dulaglutide] Nausea And Vomiting   Avandia [Rosiglitazone] Other (See Comments)    Weight gain   Codeine Itching, Swelling, Palpitations and Other (See Comments)    Heart  flutter, dizziness    Family History  Problem Relation Age of Onset   Heart Problems Brother        heart valve replacement   Anesthesia problems Brother    Heart disease Mother    Lung cancer Mother    Heart failure Mother    Heart attack Father    Heart Problems Sister        had heart valve replacement   Hypertension Brother    Diabetes Brother    Kidney failure Brother    Heart Problems Brother     Prior to Admission medications   Medication Sig Start Date End Date Taking? Authorizing Provider  acetaminophen (TYLENOL) 500 MG tablet Take 500 mg by mouth every 8 (eight) hours as needed for mild pain.    [provider]  ALPRAZolam Duanne Moron) 0.25 MG tablet Take 0.25 mg by mouth 3 (three) times daily as needed for anxiety (nerves).    [provider]  aspirin 81 MG tablet Take 81 mg by mouth daily.     [provider]  atorvastatin (LIPITOR) 80 MG tablet Take 1 tablet by mouth once daily Patient taking differently: Take  80 mg by mouth every evening. 10/09/20   Belva Crome, MD  Azelastine HCl 0.15 % SOLN Place 1 spray into both nostrils daily as needed (Allergies). 02/05/18   [provider]  carvedilol (COREG) 6.25 MG tablet TAKE 1 TABLET TWICE DAILY WITH A MEAL 11/06/19   Belva Crome, MD  cholecalciferol (VITAMIN D) 1000 UNITS tablet Take 1,000 Units by mouth daily.     [provider]  clopidogrel (PLAVIX) 75 MG tablet Take 75 mg by mouth daily with breakfast.    [provider]  dexamethasone (DECADRON) 4 MG tablet 4 mg p.o. twice daily the day before, day of and day after the chemotherapy every 3 weeks 06/03/21   Curt Bears, MD  ezetimibe (ZETIA) 10 MG tablet TAKE 1 TABLET EVERY DAY 09/14/20   Belva Crome, MD  fluticasone [facility]) 50 MCG/ACT nasal spray Place 2 sprays into the nose daily as needed for allergies or rhinitis (For nasal congestion).    [provider]  Fluticasone-Umeclidin-Vilant (TRELEGY  ELLIPTA) 100-62.5-25 MCG/ACT AEPB Inhale 1 puff into the lungs daily. 06/01/21   Freddi Starr, MD  folic acid (FOLVITE) 1 MG tablet Take 1 tablet (1 mg total) by mouth daily. 06/03/21   Curt Bears, MD  furosemide (LASIX) 40 MG tablet Take 1 tablet by mouth once daily 01/02/19   Belva Crome, MD  isosorbide mononitrate (IMDUR) 30 MG 24 hr tablet Take one tablet by mouth once daily along with your Imdur 60mg  tablet for a total of 90mg  04/05/21   Belva Crome, MD  isosorbide mononitrate (IMDUR) 60 MG 24 hr tablet Take 1 tablet (60 mg total) by mouth daily. 12/01/20   Wellington Hampshire, MD  magic mouthwash SOLN Take 5 mLs by mouth 4 (four) times daily as needed for mouth pain (for sore mouth). 06/17/21   Heilingoetter, Cassandra L, PA-C  metFORMIN (GLUCOPHAGE) 1000 MG tablet Take 1,000 mg by mouth 2 (two) times daily with a meal.    [provider]  methylPREDNISolone (MEDROL DOSEPAK) 4 MG TBPK tablet Use as instructed 06/24/21   Heilingoetter, Cassandra L, PA-C  nitroGLYCERIN (NITROSTAT) 0.4 MG SL tablet Place 0.4 mg under the tongue every 5 (five) minutes x 3 doses as needed. For chest pain    [provider]  Polyvinyl Alcohol-Povidone (CLEAR EYES ALL SEASONS OP) Place 1 drop into both eyes daily as needed (for dry eyes).    [provider]  prochlorperazine (COMPAZINE) 10 MG tablet Take 1 tablet (10 mg total) by mouth every 6 (six) hours as needed for nausea or vomiting. 06/03/21   Curt Bears, MD  ranolazine (RANEXA) 500 MG 12 hr tablet TAKE 1 TABLET TWICE DAILY 12/04/20   Belva Crome, MD  sacubitril-valsartan (ENTRESTO) 97-103 MG Take 1 tablet by mouth 2 (two) times daily. 09/16/20   Belva Crome, MD  Semaglutide,0.25 or 0.5MG /DOS, (OZEMPIC, 0.25 OR 0.5 MG/DOSE,) 2 MG/1.5ML SOPN Inject 0.5 mg into the skin once a week. Takes on Wednesday    [provider]    Physical Exam: Vitals:   06/24/21 1657 06/24/21 1912  BP: (!) 145/85 116/75  Pulse: 85  98  Resp: 18 18  Temp: 97.8 F (36.6 C) 97.7 F (36.5 C)  TempSrc: Oral Oral  SpO2: 99% 98%    Constitutional: NAD, calm, comfortable Eyes: PERRL, lids and conjunctivae normal ENMT: mouth sores Respiratory: diminished at basis, some crackles, no wheezing, no rhonchi . Normal respiratory effort. No accessory  muscle use.  Cardiovascular: Regular rate and rhythm,  No extremity edema. 2+ pedal pulses. No carotid bruits.  Abdomen: no tenderness, not distended, Bowel sounds positive.  Musculoskeletal: bilateral lower extremity pitting edema.  Skin: no rashes, lesions, ulcers. No induration Neurologic: CN 2-12 grossly intact. Sensation intact, Strength 5/5 in all 4.  Psychiatric: Normal judgment and insight. Alert and oriented x 3. Normal mood.    Labs on Admission: I have personally reviewed following labs and imaging studies  CBC: Recent Labs  Lab 06/24/21 1322 06/24/21 1728  WBC 0.6* 0.7*  NEUTROABS 0.1* 0.2*  HGB 7.5* 8.7*  HCT 20.8* 24.9*  MCV 87.0 89.2  PLT 40* 57*    Basic Metabolic Panel: Recent Labs  Lab 06/24/21 1322 06/24/21 1728  NA 125* 127*  K 4.5 4.1  CL 94* 95*  CO2 23 21*  GLUCOSE 327* 303*  BUN 25* 25*  CREATININE 1.27* 1.37*  CALCIUM 8.7* 8.8*    GFR: Estimated Creatinine Clearance: 42.6 mL/min (A) (by C-G formula based on SCr of 1.37 mg/dL (H)).  Liver Function Tests: Recent Labs  Lab 06/24/21 1322 06/24/21 1728  AST 484* 515*  ALT 589* 677*  ALKPHOS 140* 158*  BILITOT 1.3* 1.6*  PROT 6.1* 7.0  ALBUMIN 3.1* 3.1*    Urine analysis:    Component Value Date/Time   COLORURINE YELLOW 04/23/2008 0949   APPEARANCEUR CLEAR 04/23/2008 0949   LABSPEC 1.020 04/23/2008 0949   PHURINE 6.0 04/23/2008 0949   GLUCOSEU 500 (A) 04/23/2008 0949   HGBUR NEGATIVE 04/23/2008 0949   BILIRUBINUR NEGATIVE 04/23/2008 0949   KETONESUR NEGATIVE 04/23/2008 0949   PROTEINUR NEGATIVE 04/23/2008 0949   UROBILINOGEN 0.2 04/23/2008 0949   NITRITE NEGATIVE  04/23/2008 0949   LEUKOCYTESUR  04/23/2008 0949    NEGATIVE MICROSCOPIC NOT DONE ON URINES WITH NEGATIVE PROTEIN, BLOOD, LEUKOCYTES, NITRITE, OR GLUCOSE <1000 mg/dL.    Radiological Exams on Admission: US Abdomen Complete  Result Date: 06/24/2021 CLINICAL DATA:  Elevated LFTs. EXAM: ABDOMEN ULTRASOUND COMPLETE COMPARISON:  PET-CT dated May 27, 2021. FINDINGS: Gallbladder: Small gallstones and sludge. No wall thickening visualized. No sonographic Murphy sign noted by sonographer. Common bile duct: Diameter: 3 mm, normal. Liver: No focal lesion identified. Within normal limits in parenchymal echogenicity. Portal vein is patent on color Doppler imaging with normal direction of blood flow towards the liver. IVC: No abnormality visualized. Pancreas: Visualized portion unremarkable. Spleen: Size and appearance within normal limits. Right Kidney: Length: 10.7 cm. Echogenicity within normal limits. No mass or hydronephrosis visualized. Left Kidney: Length: 9.8 cm. Echogenicity within normal limits. No mass or hydronephrosis visualized. Abdominal aorta: No aneurysm visualized. Other findings: Left pleural effusion. IMPRESSION: 1. No acute abnormality. 2. Cholelithiasis and sludge. 3. Left pleural effusion. Electronically Signed   By: Titus Dubin M.D.   On: 06/24/2021 18:58   DG Chest 2 View  Result Date: 06/24/2021 CLINICAL DATA:  Lung cancer, neutropenia, worsening cough EXAM: CHEST - 2 VIEW COMPARISON:  05/04/2021 FINDINGS: Cardiomegaly status post median sternotomy and CABG. Diffuse bilateral interstitial pulmonary opacity, increased compared to prior examination. Small right pleural effusion, increased compared to prior. IMPRESSION: 1. Cardiomegaly with diffuse bilateral interstitial pulmonary opacity, consistent with edema or infection. 2. Small right pleural effusion, increased compared to prior examination. Electronically Signed   By: Delanna Ahmadi M.D.   On: 06/24/2021 15:54    EKG: Independently  reviewed.   Assessment/Plan Principal Problem:   SOB (shortness of breath)    Acute on chronic systolic CHF -  Heart failure could explain chest x-ray findings, physical exam with signs of volume overload, hyponatremia, elevated LFT -awaiting for bnp, troponin -get echocardiogram, get venous doppler lower extremity to rule out DVT -Admit to telemetry, IV Lasix, continue coreg, entresto, imdur ( with holding parameters) -Close monitor volume status  Pancytopenia Received first chemo on 6/8 with carboplatin and Alimta -received GCSF x1 today at the cancer center, will continue daily for now  -supportive transfusion -f/u on culture result ,he received iv vanc anc cefepime in the ED, hold off abx for now due to signs of volume overload, concerning for heart failure, restart abx if patient develop fever  Elevated lft  Could be multifactorial including liver congestion from heart failure Could also be side effect from alimta Repeat lft , check ck, hepatitis, ab [place] Hold statin  AKI Will get ua, renal US Renal dosing meds Repeat bmp in am  Hyponatremia Likely multifactorial including, hyperglycemia, volume overload, SIADH in the setting of lung cancer Correct glucose, getting lasix, repeat bmp in am   NIDDM2 with hyperglycemia on presentation A1c will not be reliable in the setting of anemia , will hold off checking a1c Home meds metformin/ozempic held Start ssi  CAD s/p CABG Hold statin due to elevated lft Hold plavix due to thrombocytopenia Continue coreg, imdur    DVT prophylaxis: scd's  due to thrombocytopenia    Code Status:   Full code , verified with patient  Family Communication:  Wife at bedside   Patient is from:    Anticipated DC to:    Anticipated DC date:     Consults:  Dr Julien Nordmann added to treatment team Admission status:  Inpatient  Severity of Illness:   The appropriate patient status for this patient is INPATIENT due to history and comorbidities,  severity of illness, required intensity of service to ensure the patient's safety and to avoid risk of adverse events/further clinical deterioration.  Severity of illness/comorbidities: heart failure, pancytopenia, abnormal liver and kidney function  Intensity of service: tests, high frequency of surveillance, interventions It is not anticipated that the patient will be medically stable for discharge from the hospital within 2 midnights of admission.    Voice Recognition Viviann Spare dictation system was used to create this note, attempts have been made to correct errors. Please contact the author with questions and/or clarifications.  Florencia Reasons MD PhD FACP [facility]  How to contact the [facility] Attending or Consulting provider [place] or covering provider during after hours [place], for this patient?   Check the care team in [facility] and look for a) attending/consulting [facility] provider listed and b) the [facility] team listed Log into [email or web address] and use [place]'s universal password to access. If you do not have the password, please contact the hospital operator. Locate the [facility] provider you are looking for under [facility] and page to a number that you can be directly reached. If you still have difficulty reaching the provider, please page the [facility] (Director on Call) for the Hospitalists listed on amion for assistance.  06/24/2021, 7:15 PM

## 2021-06-24 NOTE — Progress Notes (Signed)
Attempted to do nursing admission history. MD with pt and then employee came in the room to do bedside scan. Unable to complete nursing admission history at this time. Doroteo Bradford BSN, RN-BC Throughput Nurse 06/24/2021 6:11 PM

## 2021-06-24 NOTE — Progress Notes (Addendum)
[facility]   [name], MD [place] [employer] [address] [place] [address]  DIAGNOSIS: Stage IIIb (T4, N2, M0) non-small cell lung cancer, adenocarcinoma presented with large left lower lobe lung mass in addition to subcarinal lymphadenopathy diagnosed in May 2023 and pending further staging work-up. His molecular studies by [employer] showed no actionable mutations and PD-L1 expression is 2%.  PRIOR THERAPY: None  CURRENT THERAPY:  Induction systemic chemotherapy with carboplatin for AUC of 5 and Alimta 500 Mg/M2 every 3 weeks for 3 cycles.  First cycle expected on 06/10/2021. Status post 1 cycle.   INTERVAL HISTORY: [name] [name] 71 y.o. male returns to the clinic today for a lab only visit which revealed significant lab abnormalities and his appointment was changed to an acute visit.  The patient underwent his first cycle of chemotherapy 2 weeks ago on 06/10/2021.  He felt fairly well receiving treatment but has significant lab abnormalities today with pancytopenia and elevated LFTs.  The patient denies any fevers today.  He reports he feels cold/chills.  Denies any night sweats or skin infections, abdominal pain, burning with urination, sore throat, or sinus infections.  He reports he has an increase in his baseline cough.  He also has worsening dyspnea on exertion.  He is also been bruising more frequently without any significant bleeding except he had some mild blood-tinged sputum.  He has had some very mild nausea which improves with his anti-emetics.  He is only taken Tylenol 1 to 2 tablets per day if needed for discomfort/chills which his wife thinks he may have taken on 3 days or so in the last few days.  He denies any unusual diarrhea or constipation at this time.     MEDICAL HISTORY: Past Medical History:  Diagnosis Date   Anxiety    Arthritis    Chest pain    a. experience weekly, nitrate responsive c/p x several years.   b. ranexa added on  08/2014 admission    Chronic combined systolic and diastolic CHF (congestive heart failure) ([place])    Claudication ([place])    a. 08/2014 ABI: R 0.79, L 1.0-->Walking program instituted.   Coronary artery disease    a. s/p MI in 1989;  b. 04/2008 CABG x 5 (LIMA->LAD, VG->D1, VG->RI, VG->PDA, L Radial->OM2);  c. 06/2010 PCI/DES to the Diag (2.25x28 Promus Element DES). VG->Diag occluded;  d. 08/2010 Attempted PTCA of the OM2 via the free radial graft. Unsuccessful.  Lesion felt to be 2/2 suture.  c. NSTEMI s/p [facility] with severe native and graph dz; D1 that was stented in 2012 now occulded: Rx therapy recommended   Coronary artery disease involving native coronary artery of native heart with angina pectoris ([place])    a. s/p MI in 1989;  b. 04/2008 CABG x 5 (LIMA->LAD, VG->D1, VG->RI, VG->PDA, L Radial->OM2);  c. 06/2010 PCI/DES to the Diag (2.25x28 Promus Element DES). VG->Diag occluded;  d. 08/2010 Attempted PTCA of the OM2 via the free radial graft. Unsuccessful.  Lesion felt to be 2/2 suture.  c. NSTEMI s/p [facility] with severe native and graph dz; Rx therapy recommended    COVID 2021   mild case   Diabetes mellitus ([place]) 06/02/2011   type 2   Hyperlipidemia    Hypertension    NSTEMI (non-ST elevated myocardial infarction) ([place]) 08/29/2014   Peripheral arterial disease ([place]) 08/16/2014   Pneumonia    Type II diabetes mellitus ([place])     ALLERGIES:  is allergic to  aldactone [spironolactone], amlodipine, farxiga [dapagliflozin], percocet [oxycodone-acetaminophen], trulicity [dulaglutide], avandia [rosiglitazone], and codeine.  MEDICATIONS:  Current Outpatient Medications  Medication Sig Dispense Refill   methylPREDNISolone (MEDROL DOSEPAK) 4 MG TBPK tablet Use as instructed 21 tablet 0   acetaminophen (TYLENOL) 500 MG tablet Take 500 mg by mouth every 8 (eight) hours as needed for mild pain.     ALPRAZolam (XANAX) 0.25 MG tablet Take 0.25 mg by mouth 3 (three) times daily as needed for anxiety (nerves).      aspirin 81 MG tablet Take 81 mg by mouth daily.      atorvastatin (LIPITOR) 80 MG tablet Take 1 tablet by mouth once daily (Patient taking differently: Take 80 mg by mouth every evening.) 90 tablet 3   Azelastine HCl 0.15 % SOLN Place 1 spray into both nostrils daily as needed (Allergies).     carvedilol (COREG) 6.25 MG tablet TAKE 1 TABLET TWICE DAILY WITH A MEAL 180 tablet 3   cholecalciferol (VITAMIN D) 1000 UNITS tablet Take 1,000 Units by mouth daily.      clopidogrel (PLAVIX) 75 MG tablet Take 75 mg by mouth daily with breakfast.     dexamethasone (DECADRON) 4 MG tablet 4 mg p.o. twice daily the day before, day of and day after the chemotherapy every 3 weeks 20 tablet 0   ezetimibe (ZETIA) 10 MG tablet TAKE 1 TABLET EVERY DAY 90 tablet 3   fluticasone (FLONASE) 50 MCG/ACT nasal spray Place 2 sprays into the nose daily as needed for allergies or rhinitis (For nasal congestion).     Fluticasone-Umeclidin-Vilant (TRELEGY ELLIPTA) 100-62.5-25 MCG/ACT AEPB Inhale 1 puff into the lungs daily. 1 each 0   folic acid (FOLVITE) 1 MG tablet Take 1 tablet (1 mg total) by mouth daily. 30 tablet 4   furosemide (LASIX) 40 MG tablet Take 1 tablet by mouth once daily 90 tablet 3   isosorbide mononitrate (IMDUR) 30 MG 24 hr tablet Take one tablet by mouth once daily along with your Imdur 66m tablet for a total of 970m90 tablet 3   isosorbide mononitrate (IMDUR) 60 MG 24 hr tablet Take 1 tablet (60 mg total) by mouth daily. 90 tablet 3   magic mouthwash SOLN Take 5 mLs by mouth 4 (four) times daily as needed for mouth pain (for sore mouth). 240 mL 1   metFORMIN (GLUCOPHAGE) 1000 MG tablet Take 1,000 mg by mouth 2 (two) times daily with a meal.     nitroGLYCERIN (NITROSTAT) 0.4 MG SL tablet Place 0.4 mg under the tongue every 5 (five) minutes x 3 doses as needed. For chest pain     Polyvinyl Alcohol-Povidone (CLEAR EYES ALL SEASONS OP) Place 1 drop into both eyes daily as needed (for dry eyes).      prochlorperazine (COMPAZINE) 10 MG tablet Take 1 tablet (10 mg total) by mouth every 6 (six) hours as needed for nausea or vomiting. 30 tablet 0   ranolazine (RANEXA) 500 MG 12 hr tablet TAKE 1 TABLET TWICE DAILY 180 tablet 2   sacubitril-valsartan (ENTRESTO) 97-103 MG Take 1 tablet by mouth 2 (two) times daily. 60 tablet 11   Semaglutide,0.25 or 0.5MG/DOS, (OZEMPIC, 0.25 OR 0.5 MG/DOSE,) 2 MG/1.5ML SOPN Inject 0.5 mg into the skin once a week. Takes on Wednesday     No current facility-administered medications for this visit.   Facility-Administered Medications Ordered in Other Visits  Medication Dose Route Frequency Provider Last Rate Last Admin   filgrastim-sndz (ZARXIO) injection 300 mcg  300  mcg Subcutaneous Once Ayahna Solazzo L, PA-C       insulin aspart (novoLOG) injection 5 Units  5 Units Subcutaneous Once Nena Hampe L, PA-C        SURGICAL HISTORY:  Past Surgical History:  Procedure Laterality Date   BRONCHIAL BIOPSY  05/04/2021   Procedure: BRONCHIAL BIOPSIES;  Surgeon: Garner Nash, DO;  Location: [facility];  Service: Pulmonary;;   BRONCHIAL BRUSHINGS  05/04/2021   Procedure: BRONCHIAL BRUSHINGS;  Surgeon: Garner Nash, DO;  Location: [facility];  Service: Pulmonary;;   BRONCHIAL WASHINGS  05/04/2021   Procedure: BRONCHIAL WASHINGS;  Surgeon: Garner Nash, DO;  Location: [facility];  Service: Pulmonary;;   CARDIAC CATHETERIZATION N/A 08/28/2014   Procedure: Left Heart Cath and Cors/Grafts Angiography;  Surgeon: Belva Crome, MD;  Location: [facility];  Service: Cardiovascular;  Laterality: N/A;   COLONOSCOPY     CORONARY ANGIOPLASTY WITH STENT PLACEMENT     CORONARY ARTERY BYPASS GRAFT     FINE NEEDLE ASPIRATION  05/04/2021   Procedure: FINE NEEDLE ASPIRATION (FNA) LINEAR;  Surgeon: Garner Nash, DO;  Location: [facility];  Service: Pulmonary;;   LEFT HEART CATH AND CORS/GRAFTS ANGIOGRAPHY N/A 03/20/2018   Procedure: LEFT HEART  CATH AND CORS/GRAFTS ANGIOGRAPHY;  Surgeon: Martinique, Peter M, MD;  Location: [facility];  Service: Cardiovascular;  Laterality: N/A;   PERIPHERAL VASCULAR CATHETERIZATION N/A 07/15/2015   Procedure: Abdominal Aortogram w/Lower Extremity;  Surgeon: Wellington Hampshire, MD;  Location: [facility];  Service: Cardiovascular;  Laterality: N/A;   PERIPHERAL VASCULAR CATHETERIZATION Bilateral 07/15/2015   Procedure: Peripheral Vascular Intervention;  Surgeon: Wellington Hampshire, MD;  Location: [facility];  Service: Cardiovascular;  Laterality: Bilateral;  Common Iliacs   SHOULDER ARTHROSCOPY WITH ROTATOR CUFF REPAIR AND SUBACROMIAL DECOMPRESSION Left 05/23/2013   Procedure: LEFT SHOULDER ARTHROSCOPY SUBACROMIAL  DECOMPRESSION DISTAL CLAVICLE RESECTION AND ROTATOR CUFF REPAIR ;  Surgeon: Marin Shutter, MD;  Location: [facility];  Service: Orthopedics;  Laterality: Left;   SHOULDER SURGERY Right 7-8 years ago   Dr. Dorna Leitz   VIDEO BRONCHOSCOPY WITH ENDOBRONCHIAL ULTRASOUND Bilateral 05/04/2021   Procedure: VIDEO BRONCHOSCOPY WITH ENDOBRONCHIAL ULTRASOUND;  Surgeon: Garner Nash, DO;  Location: [facility];  Service: Pulmonary;  Laterality: Bilateral;    REVIEW OF SYSTEMS:   Review of Systems  Constitutional: Negative for appetite change, chills, fatigue, fever and unexpected weight change.  HENT:   Negative for mouth sores, nosebleeds, sore throat and trouble swallowing.   Eyes: Negative for eye problems and icterus.  Respiratory: Positive for worsening cough and shortness of breath with exertion.  Positive for occasional mild blood-tinged sputum.  Cardiovascular: Negative for chest pain and leg swelling.  Gastrointestinal: Positive for mild nausea.  Negative for abdominal pain, constipation, diarrhea, and vomiting.  Genitourinary: Negative for bladder incontinence, difficulty urinating, dysuria, frequency and hematuria.   Musculoskeletal: Negative for back pain, gait problem, neck pain  and neck stiffness.  Skin: Negative for itching and rash.  Neurological: Negative for dizziness, extremity weakness, gait problem, headaches, light-headedness and seizures.  Hematological: Negative for adenopathy.  Positive for easy bruising.  Negative for bleeding. Psychiatric/Behavioral: Negative for confusion, depression and sleep disturbance. The patient is not nervous/anxious.     PHYSICAL EXAMINATION:  There were no vitals taken for this visit.  Respirations 18, Temp 97.8, Pulse 75, BP 118/72  ECOG PERFORMANCE STATUS: 1-2  Physical Exam  Constitutional: Oriented to person, place, and time  and well-developed, well-nourished, and in no distress.  HENT:  Head: Normocephalic and atraumatic.  Mouth/Throat: Oropharynx is clear and moist. No oropharyngeal exudate.  Eyes: Conjunctivae are normal. Right eye exhibits no discharge. Left eye exhibits no discharge. No scleral icterus.  Neck: Normal range of motion. Neck supple.  Cardiovascular: Normal rate, regular rhythm, normal heart sounds and intact distal pulses.   Pulmonary/Chest: Effort normal and breath sounds normal. Mild rhonchi noted in left lung.  No respiratory distress. No rales.  Abdominal: Soft. Bowel sounds are normal. Exhibits no distension and no mass. There is no tenderness.  Musculoskeletal: Normal range of motion. Exhibits no edema.  Lymphadenopathy:    No cervical adenopathy.  Neurological: Alert and oriented to person, place, and time. Exhibits normal muscle tone.  Examined in the wheelchair.  Skin: Skin is warm and dry. No rash noted. Not diaphoretic. No erythema. No pallor.  Psychiatric: Mood, memory and judgment normal.  Vitals reviewed.  LABORATORY DATA: Lab Results  Component Value Date   WBC 0.6 (LL) 06/24/2021   HGB 7.5 (L) 06/24/2021   HCT 20.8 (L) 06/24/2021   MCV 87.0 06/24/2021   PLT 40 (L) 06/24/2021      Chemistry      Component Value Date/Time   NA 125 (L) 06/24/2021 1322   NA 127 (L)  03/24/2021 1025   K 4.5 06/24/2021 1322   CL 94 (L) 06/24/2021 1322   CO2 23 06/24/2021 1322   BUN 25 (H) 06/24/2021 1322   BUN 13 03/24/2021 1025   CREATININE 1.27 (H) 06/24/2021 1322   CREATININE 0.88 11/13/2015 1131      Component Value Date/Time   CALCIUM 8.7 (L) 06/24/2021 1322   ALKPHOS 140 (H) 06/24/2021 1322   AST 484 (HH) 06/24/2021 1322   ALT 589 (HH) 06/24/2021 1322   BILITOT 1.3 (H) 06/24/2021 1322       RADIOGRAPHIC STUDIES:  MR BRAIN W WO CONTRAST  Result Date: 06/01/2021 CLINICAL DATA:  Non-small cell lung cancer, staging. Malignant neoplasm of unspecified part of bronchus or lung. EXAM: MRI HEAD WITHOUT AND WITH CONTRAST TECHNIQUE: Multiplanar, multiecho pulse sequences of the brain and surrounding structures were obtained without and with intravenous contrast. CONTRAST:  15m GADAVIST GADOBUTROL 1 MMOL/ML IV SOLN COMPARISON:  None Available. FINDINGS: Brain: Mild age related volume loss. No evidence of acute or subacute infarction. No evidence of metastatic disease. Mild chronic small-vessel ischemic change of the cerebral hemispheric white matter. No cortical or large vessel territory infarction. No mass, hemorrhage, hydrocephalus or extra-axial collection. No abnormal contrast enhancement occurs. Vascular: Major vessels at the base of the brain show flow. Skull and upper cervical spine: Negative Sinuses/Orbits: Clear/normal Other: None IMPRESSION: No evidence of metastatic disease. No acute intracranial finding. Mild chronic small-vessel ischemic change of the cerebral hemispheric white matter. Electronically Signed   By: MNelson ChimesM.D.   On: 06/01/2021 07:55   NM PET Image Initial (PI) Skull Base To Thigh (F-18 FDG)  Result Date: 05/29/2021 CLINICAL DATA:  Initial treatment strategy for non-small lung cancer. Adenocarcinoma on bronchoscopy with station 7 involvement. EXAM: NUCLEAR MEDICINE PET SKULL BASE TO THIGH TECHNIQUE: 7.51 mCi F-18 FDG was injected  intravenously. Full-ring PET imaging was performed from the skull base to thigh after the radiotracer. CT data was obtained and used for attenuation correction and anatomic localization. Fasting blood glucose: 132 mg/dl COMPARISON:  Chest CT 04/30/2021 and 02/05/2021 FINDINGS: Mediastinal blood pool activity: SUV max 2.1 NECK: No hypermetabolic cervical lymph nodes are  identified.There are no lesions of the pharyngeal mucosal space. Incidental CT findings: Severe bilateral carotid atherosclerosis. CHEST: There are small nonspecific mildly hypermetabolic mediastinal and hilar lymph nodes bilaterally. Representative nodes include inferior right hilar node with an SUV max of 4.0, a left hilar node with an SUV max of 3.8 and a subcarinal node with an SUV max of 3.8. There is heterogeneous low level hypermetabolic activity throughout the previously demonstrated left lower lobe consolidation (greatest SUV max 5.3 superomedially). No hypermetabolic activity or suspicious pulmonary nodularity within the left upper lobe or the right lung. Incidental CT findings: Stable small left pleural effusion without significant hypermetabolic activity. Diffuse atherosclerosis of the aorta, great vessels and coronary arteries status post median sternotomy and CABG. There are calcifications of the aortic valve. The heart size is at the upper limits of normal. Centrilobular and paraseptal emphysema noted. ABDOMEN/PELVIS: There is no hypermetabolic activity within the liver, adrenal glands, spleen or pancreas. There is no hypermetabolic nodal activity. Scattered bowel activity within physiologic limits. Incidental CT findings: Small calcified gallstones. Diffuse aortic and branch vessel atherosclerosis. SKELETON: There is no hypermetabolic activity to suggest osseous metastatic disease. Incidental CT findings: Multilevel spondylosis. IMPRESSION: 1. Nonspecific low-level hypermetabolic activity within the progressive mass-like consolidation  in the left lower lobe corresponding with known adenocarcinoma on bronchoscopic biopsy/brushings. No suspicious pulmonary metabolic activity outside of the left lower lobe. 2. Nonspecific small mildly hypermetabolic mediastinal and hilar lymph nodes bilaterally which are suspicious for early nodal metastases given previous positive subcarinal nodal biopsy. 3. No evidence of distant metastatic disease. 4. Cholelithiasis. Coronary and aortic atherosclerosis (ICD10-I70.0). Emphysema (ICD10-J43.9). Electronically Signed   By: Richardean Sale M.D.   On: 05/29/2021 11:47     ASSESSMENT/PLAN:  This is a very pleasant 71 year old Caucasian male diagnosed with stage IIIb (T4, N0/N2, M0) non-small cell lung cancer, adenocarcinoma.  The patient presented with a large left lower lobe lung mass with suspicious mediastinal lymphadenopathy.  He was diagnosed in May 2023.  His PD-L1 expression is 2% and he has no actionable mutations by [employer].  The patient is currently undergoing induction chemotherapy for 2-3 cycles with carboplatin for an AUC of 5 and Alimta 500 mg per metered squared IV every 3 weeks.  The goal of treatment is to decrease the volume of the disease/tumor to decrease the radiation field. Dr. Julien Nordmann would likely recommend following his current treatment with concurrent chemo/radiation in the near future.   The patient was here today for a weekly lab draw which noted significant lab abnormalities.  His white blood cell count is 0.6 and his ANC 0.1.  The patient's hemoglobin is 7.5 and he reports that he has been colder over the last few days.  Denies any significant bleeding except for specks of blood in the sputum only on a few occasions.  The patient's platelet count is 40,000.  The patient takes aspirin and was instructed to hold this at this time.  His CMP also shows abnormalities today with an elevated blood sugar at 327, mildly elevated creatinine at 1.27, and significantly elevated LFTs.    I reviewed the patient's labs with Dr. Julien Nordmann via telephone today.  The initial plan pending further workup would be to arrange for the patient to have 3 doses of Zarxio (today, tomorrow, and Saturday) for his neutropenia.  I will also arrange for him to have a chest x-ray performed today due to the worsening cough to rule out any pneumonia or infection.  We were arranging for the  patient to receive 2 units of blood tomorrow. Orders have been entered. Advised to hold his aspirin until improvement of his platelet count.  We were going to start the patient on a Medrol Dosepak for possible drug-induced hepatotoxicity. We are arranging for the patient to receive 5 units of insulin while in the clinic today. I discussed with him that steroids may cause insomnia and hyperglycemia.  I strongly encouraged to monitor his blood sugar very closely at home and to follow strict dietary restrictions for his diabetes while undergoing steroid treatment.  I also encouraged him to hydrate at home.   Patient was advised to avoid any alcohol Tylenol, or ibuprofen at this time.  The patient was given strict emergency room precautions as we are leading into a weekend.  If he develops any signs and symptoms of infection, abnormal vitals, uncontrolled hyperglycemia, fevers, chills, upper respiratory infections, skin infections, diarrhea, abdominal pain, dysuria, jaundice, significant bleeding, shortness of breath, etc to seek emergency room evaluation   We will see the patient for follow-up visit next week to recheck his labs.  We will likely delay the start of cycle #2 and will have to discuss dose adjustments to his therapy.   ADDENDUM: The patient's CXR today shows cardiomegaly with diffuse bilateral interstitial pulmonary opacity which is consistent with edema or infection. Given the significant lab abnormalities, neutropenia with chills and possible infection, I believe that the patient's condition can deteriorate  quickly and he requires close monitoring. I called the patient and recommended he go to the emergency room.  He expressed understanding. He received one dose of Zarxio today in the clinic for his neutropenia.   The patient was advised to call immediately if he has any concerning symptoms in the interval. The patient voices understanding of current disease status and treatment options and is in agreement with the current care plan. All questions were answered. The patient knows to call the clinic with any problems, questions or concerns. We can certainly see the patient much sooner if necessary       Orders Placed This Encounter  Procedures   DG Chest 2 View    Standing Status:   Future    Standing Expiration Date:   06/24/2022    Order Specific Question:   Reason for Exam (SYMPTOM  OR DIAGNOSIS REQUIRED)    Answer:   Lung cancer, neutropenia, worsening cough. Rule out infection    Order Specific Question:   Preferred imaging location?    Answer:   [facility]   Sample to Blood Bank    Standing Status:   Standing    Number of Occurrences:   5    Standing Expiration Date:   06/25/2022     The total time spent in the appointment was >50 minutes   Emeric Novinger L Jaicion Laurie, PA-C 06/24/21

## 2021-06-25 ENCOUNTER — Other Ambulatory Visit: Payer: Self-pay

## 2021-06-25 ENCOUNTER — Inpatient Hospital Stay: Payer: Medicare PPO

## 2021-06-25 ENCOUNTER — Inpatient Hospital Stay (HOSPITAL_COMMUNITY): Payer: Medicare PPO

## 2021-06-25 DIAGNOSIS — R0602 Shortness of breath: Secondary | ICD-10-CM | POA: Diagnosis not present

## 2021-06-25 DIAGNOSIS — I2510 Atherosclerotic heart disease of native coronary artery without angina pectoris: Secondary | ICD-10-CM | POA: Diagnosis not present

## 2021-06-25 DIAGNOSIS — I5023 Acute on chronic systolic (congestive) heart failure: Secondary | ICD-10-CM

## 2021-06-25 DIAGNOSIS — K1231 Oral mucositis (ulcerative) due to antineoplastic therapy: Secondary | ICD-10-CM | POA: Insufficient documentation

## 2021-06-25 LAB — CBC WITH DIFFERENTIAL/PLATELET
Abs Immature Granulocytes: 0.02 10*3/uL (ref 0.00–0.07)
Basophils Absolute: 0 10*3/uL (ref 0.0–0.1)
Basophils Relative: 0 %
Eosinophils Absolute: 0 10*3/uL (ref 0.0–0.5)
Eosinophils Relative: 0 %
HCT: 21.3 % — ABNORMAL LOW (ref 39.0–52.0)
Hemoglobin: 7.5 g/dL — ABNORMAL LOW (ref 13.0–17.0)
Immature Granulocytes: 1 %
Lymphocytes Relative: 38 %
Lymphs Abs: 0.6 10*3/uL — ABNORMAL LOW (ref 0.7–4.0)
MCH: 30.9 pg (ref 26.0–34.0)
MCHC: 35.2 g/dL (ref 30.0–36.0)
MCV: 87.7 fL (ref 80.0–100.0)
Monocytes Absolute: 0.3 10*3/uL (ref 0.1–1.0)
Monocytes Relative: 17 %
Neutro Abs: 0.6 10*3/uL — ABNORMAL LOW (ref 1.7–7.7)
Neutrophils Relative %: 44 %
Platelets: 54 10*3/uL — ABNORMAL LOW (ref 150–400)
RBC: 2.43 MIL/uL — ABNORMAL LOW (ref 4.22–5.81)
RDW: 13.2 % (ref 11.5–15.5)
WBC: 1.5 10*3/uL — ABNORMAL LOW (ref 4.0–10.5)
nRBC: 2 % — ABNORMAL HIGH (ref 0.0–0.2)

## 2021-06-25 LAB — ECHOCARDIOGRAM COMPLETE
AR max vel: 0.74 cm2
AV Area VTI: 0.7 cm2
AV Area mean vel: 0.7 cm2
AV Mean grad: 14 mmHg
AV Peak grad: 22.9 mmHg
Ao pk vel: 2.39 m/s
Area-P 1/2: 10.25 cm2
Calc EF: 28.8 %
Height: 66 in
MV M vel: 5.17 m/s
MV Peak grad: 106.9 mmHg
P 1/2 time: 266 msec
Radius: 0.4 cm
S' Lateral: 5.3 cm
Single Plane A2C EF: 28.6 %
Single Plane A4C EF: 30.2 %
Weight: 2620.48 oz

## 2021-06-25 LAB — TYPE AND SCREEN
ABO/RH(D): A POS
Antibody Screen: NEGATIVE
Unit division: 0
Unit division: 0

## 2021-06-25 LAB — COMPREHENSIVE METABOLIC PANEL
ALT: 612 U/L — ABNORMAL HIGH (ref 0–44)
AST: 426 U/L — ABNORMAL HIGH (ref 15–41)
Albumin: 2.7 g/dL — ABNORMAL LOW (ref 3.5–5.0)
Alkaline Phosphatase: 127 U/L — ABNORMAL HIGH (ref 38–126)
Anion gap: 11 (ref 5–15)
BUN: 25 mg/dL — ABNORMAL HIGH (ref 8–23)
CO2: 21 mmol/L — ABNORMAL LOW (ref 22–32)
Calcium: 8.3 mg/dL — ABNORMAL LOW (ref 8.9–10.3)
Chloride: 98 mmol/L (ref 98–111)
Creatinine, Ser: 1.17 mg/dL (ref 0.61–1.24)
GFR, Estimated: >60 mL/min (ref >60)
Glucose, Bld: 202 mg/dL — ABNORMAL HIGH (ref 70–99)
Potassium: 3.9 mmol/L (ref 3.5–5.1)
Sodium: 130 mmol/L — ABNORMAL LOW (ref 135–145)
Total Bilirubin: 1.4 mg/dL — ABNORMAL HIGH (ref 0.3–1.2)
Total Protein: 5.9 g/dL — ABNORMAL LOW (ref 6.5–8.1)

## 2021-06-25 LAB — CK: Total CK: 111 U/L (ref 49–397)

## 2021-06-25 LAB — HEPATITIS PANEL, ACUTE
HCV Ab: NONREACTIVE
Hep A IgM: NONREACTIVE
Hep B C IgM: NONREACTIVE
Hepatitis B Surface Ag: NONREACTIVE

## 2021-06-25 LAB — BPAM RBC
Blood Product Expiration Date: 202307132359
Blood Product Expiration Date: 202307132359
Unit Type and Rh: 6200
Unit Type and Rh: 6200

## 2021-06-25 LAB — GLUCOSE, CAPILLARY
Glucose-Capillary: 181 mg/dL — ABNORMAL HIGH (ref 70–99)
Glucose-Capillary: 231 mg/dL — ABNORMAL HIGH (ref 70–99)

## 2021-06-25 LAB — MAGNESIUM: Magnesium: 1.9 mg/dL (ref 1.7–2.4)

## 2021-06-25 LAB — TROPONIN I (HIGH SENSITIVITY): Troponin I (High Sensitivity): 49 ng/L — ABNORMAL HIGH (ref <18)

## 2021-06-25 LAB — PREPARE RBC (CROSSMATCH)

## 2021-06-25 MED ORDER — SODIUM CHLORIDE 0.9% IV SOLUTION: Freq: Once | INTRAVENOUS | Status: AC

## 2021-06-25 MED ORDER — INSULIN ASPART 100 UNIT/ML IJ SOLN
0.0000 [IU] | Freq: Every day | INTRAMUSCULAR | Status: DC
  Administered 2021-06-25: 4 [IU] via SUBCUTANEOUS

## 2021-06-25 MED ORDER — INSULIN ASPART 100 UNIT/ML IJ SOLN
0.0000 [IU] | Freq: Three times a day (TID) | INTRAMUSCULAR | Status: DC
  Administered 2021-06-26: 8 [IU] via SUBCUTANEOUS
  Administered 2021-06-26: 3 [IU] via SUBCUTANEOUS
  Administered 2021-06-26: 5 [IU] via SUBCUTANEOUS
  Administered 2021-06-27: 11 [IU] via SUBCUTANEOUS
  Administered 2021-06-27: 3 [IU] via SUBCUTANEOUS

## 2021-06-25 MED ORDER — CARVEDILOL 3.125 MG PO TABS
3.1250 mg | ORAL_TABLET | Freq: Two times a day (BID) | ORAL | Status: DC
  Administered 2021-06-26 – 2021-06-27 (×3): 3.125 mg via ORAL
  Filled 2021-06-25 (×3): qty 1

## 2021-06-25 MED ORDER — MAGIC MOUTHWASH W/LIDOCAINE
5.0000 mL | Freq: Four times a day (QID) | ORAL | Status: DC
  Administered 2021-06-25 – 2021-06-27 (×6): 5 mL via ORAL
  Filled 2021-06-25 (×9): qty 5

## 2021-06-25 MED ORDER — ALUM & MAG HYDROXIDE-SIMETH 200-200-20 MG/5ML PO SUSP
15.0000 mL | Freq: Once | ORAL | Status: AC
Start: 2021-06-25 — End: 2021-06-25
  Administered 2021-06-25: 15 mL via ORAL

## 2021-06-25 NOTE — Progress Notes (Addendum)
HEMATOLOGY-ONCOLOGY PROGRESS NOTE  I saw the patient, examined him and edited the notes as follows ASSESSMENT AND PLAN:  Stage IIIb non-small cell lung cancer, adenocarcinoma The patient has started systemic chemotherapy with carboplatin and Alimta on 06/10/2021 Status post 1 cycle of chemotherapy He will have outpatient follow-up with Dr. Arbutus Ped for ongoing systemic chemotherapy Continue supportive care  Pancytopenia secondary to recent chemotherapy Labs from today reviewed and neutropenia slightly improved today Recommend Granix daily until ANC is 1.5 or higher Recommend PRBC transfusion for hemoglobin less than 8 He will receive 1 unit PRBCs today Platelet count is 54,000 I recommend close monitoring We will transfuse platelets if platelet count less than 20,000 or active bleeding  Acute on chronic CHF Currently being diuresed Cardiology following We will defer management to cardiology and primary team  Elevated LFTs Possibly related to CHF versus drug toxicity [place] is unremarkable Liver enzymes stable today Monitor  AKI Renal function has improved today Monitor  Diabetes mellitus On sliding scale insulin We will defer management to hospitalist  Mucositis due to chemotherapy I recommend Magic mouthwash solution to be used  Cough, shortness of breath Likely related to CHF I reviewed his chest x-ray I told the patient I will hold off giving him steroids because I do not believe his symptoms is due to COPD exacerbation   Caren Macadam, MD  SUBJECTIVE: Mr. Bohlen is followed by Dr. Arbutus Ped for stage IIIb (T4, N2, M0) non-small cell lung cancer, adenocarcinoma.  He is currently receiving induction systemic chemotherapy with carboplatin for an AUC of 5 and Alimta 500 mg per metered squared.  He came to the cancer yesterday for a lab visit and was found to have significant lab abnormalities.  He was seen by the PA in the office due to these abnormal findings.   In our office, his WBC was 0.6, ANC 0.1, hemoglobin 7.5.  No fever was noted.  Due to his abnormal lab findings, the case was discussed with Dr. Arbutus Ped who recommended Granix daily x3 days and for PRBC transfusion on the weekend.  He was also given a Medrol Dosepak due to possible drug-induced hepatotoxicity.  He had a chest x-ray which showed cardiomegaly with diffuse bilateral interstitial pulmonary opacity consistent with edema or fraction.  Therefore, he was sent to the emergency department for further evaluation.  Today, patient reports that he is feeling better.  He is not having any fevers or chills.  Due to receive 1 unit PRBCs today.  He is not having any chest pain and his shortness of breath has improved.  He states that he is still coughing and is starting to bring up some sputum.  He has not looked at so does not know what color it is.  Denies bleeding.  He has not been eating well due to mucositis  Oncology History  Adenocarcinoma of left lung, stage 3 (HCC)  05/18/2021 Initial Diagnosis   Adenocarcinoma of left lung, stage 3 (HCC)   05/18/2021 Cancer Staging   Staging form: Lung, AJCC 8th Edition - Clinical: Stage IIIB (cT4, cN2, cM0) - Signed by Si Gaul, MD on 05/18/2021   06/10/2021 -  Chemotherapy   Patient is on Treatment Plan : LUNG NSCLC Pemetrexed + Carboplatin q21d x 3 Cycles        REVIEW OF SYSTEMS:   Review of Systems  Constitutional:  Positive for malaise/fatigue. Negative for chills and fever.  HENT: Negative.    Respiratory:  Positive for cough and sputum production.  Negative for shortness of breath.   Cardiovascular: Negative.   Gastrointestinal: Negative.   Skin: Negative.   Neurological: Negative.   Psychiatric/Behavioral: Negative.      I have reviewed the past medical history, past surgical history, social history and family history with the patient and they are unchanged from previous note.   PHYSICAL EXAMINATION: ECOG PERFORMANCE STATUS: 2 -  Symptomatic, <50% confined to bed  Vitals:   06/25/21 0641 06/25/21 0831  BP: 122/86   Pulse: 87   Resp: 17 (!) 25  Temp: 97.8 F (36.6 C)   SpO2: 99% 99%   Filed Weights   06/24/21 2032 06/25/21 0500  Weight: 74.4 kg 74.3 kg    Intake/Output from previous day: 06/22 0701 - 06/23 0700 In: 120 [P.O.:120] Out: 550 [Urine:550]  Physical Exam Vitals reviewed.  Constitutional:      General: He is not in acute distress. HENT:     Head: Normocephalic.  Eyes:     General: No scleral icterus.    Conjunctiva/sclera: Conjunctivae normal.  Cardiovascular:     Rate and Rhythm: Normal rate.  Pulmonary:     Effort: Pulmonary effort is normal. No respiratory distress.  Abdominal:     Palpations: Abdomen is soft.     Tenderness: There is no abdominal tenderness.  Skin:    General: Skin is warm and dry.  Neurological:     Mental Status: He is alert and oriented to person, place, and time.     LABORATORY DATA:  I have reviewed the data as listed    Latest Ref Rng & Units 06/25/2021    4:34 AM 06/24/2021    5:28 PM 06/24/2021    1:22 PM  CMP  Glucose 70 - 99 mg/dL 295  188  416   BUN 8 - 23 mg/dL 25  25  25    Creatinine 0.61 - 1.24 mg/dL 6.06  3.01  6.01   Sodium 135 - 145 mmol/L 130  127  125   Potassium 3.5 - 5.1 mmol/L 3.9  4.1  4.5   Chloride 98 - 111 mmol/L 98  95  94   CO2 22 - 32 mmol/L 21  21  23    Calcium 8.9 - 10.3 mg/dL 8.3  8.8  8.7   Total Protein 6.5 - 8.1 g/dL 5.9  7.0  6.1   Total Bilirubin 0.3 - 1.2 mg/dL 1.4  1.6  1.3   Alkaline Phos 38 - 126 U/L 127  158  140   AST 15 - 41 U/L 426  515  484   ALT 0 - 44 U/L 612  677  589     Lab Results  Component Value Date   WBC 1.5 (L) 06/25/2021   HGB 7.5 (L) 06/25/2021   HCT 21.3 (L) 06/25/2021   MCV 87.7 06/25/2021   PLT 54 (L) 06/25/2021   NEUTROABS 0.6 (L) 06/25/2021    No results found for: "CEA1", "CEA", "UXN235", "CA125", "PSA1"  US Abdomen Complete  Result Date: 06/24/2021 CLINICAL DATA:   Elevated LFTs. EXAM: ABDOMEN ULTRASOUND COMPLETE COMPARISON:  PET-CT dated May 27, 2021. FINDINGS: Gallbladder: Small gallstones and sludge. No wall thickening visualized. No sonographic Murphy sign noted by sonographer. Common bile duct: Diameter: 3 mm, normal. Liver: No focal lesion identified. Within normal limits in parenchymal echogenicity. Portal vein is patent on color Doppler imaging with normal direction of blood flow towards the liver. IVC: No abnormality visualized. Pancreas: Visualized portion unremarkable. Spleen: Size and appearance within normal  limits. Right Kidney: Length: 10.7 cm. Echogenicity within normal limits. No mass or hydronephrosis visualized. Left Kidney: Length: 9.8 cm. Echogenicity within normal limits. No mass or hydronephrosis visualized. Abdominal aorta: No aneurysm visualized. Other findings: Left pleural effusion. IMPRESSION: 1. No acute abnormality. 2. Cholelithiasis and sludge. 3. Left pleural effusion. Electronically Signed   By: Obie Dredge M.D.   On: 06/24/2021 18:58   DG Chest 2 View  Result Date: 06/24/2021 CLINICAL DATA:  Lung cancer, neutropenia, worsening cough EXAM: CHEST - 2 VIEW COMPARISON:  05/04/2021 FINDINGS: Cardiomegaly status post median sternotomy and CABG. Diffuse bilateral interstitial pulmonary opacity, increased compared to prior examination. Small right pleural effusion, increased compared to prior. IMPRESSION: 1. Cardiomegaly with diffuse bilateral interstitial pulmonary opacity, consistent with edema or infection. 2. Small right pleural effusion, increased compared to prior examination. Electronically Signed   By: Jearld Lesch M.D.   On: 06/24/2021 15:54   MR BRAIN W WO CONTRAST  Result Date: 06/01/2021 CLINICAL DATA:  Non-small cell lung cancer, staging. Malignant neoplasm of unspecified part of bronchus or lung. EXAM: MRI HEAD WITHOUT AND WITH CONTRAST TECHNIQUE: Multiplanar, multiecho pulse sequences of the brain and surrounding structures  were obtained without and with intravenous contrast. CONTRAST:  7mL GADAVIST GADOBUTROL 1 MMOL/ML IV SOLN COMPARISON:  None Available. FINDINGS: Brain: Mild age related volume loss. No evidence of acute or subacute infarction. No evidence of metastatic disease. Mild chronic small-vessel ischemic change of the cerebral hemispheric white matter. No cortical or large vessel territory infarction. No mass, hemorrhage, hydrocephalus or extra-axial collection. No abnormal contrast enhancement occurs. Vascular: Major vessels at the base of the brain show flow. Skull and upper cervical spine: Negative Sinuses/Orbits: Clear/normal Other: None IMPRESSION: No evidence of metastatic disease. No acute intracranial finding. Mild chronic small-vessel ischemic change of the cerebral hemispheric white matter. Electronically Signed   By: [facility] Fusi M.D.   On: 06/01/2021 07:55   NM PET Image Initial (PI) Skull Base To Thigh (F-18 FDG)  Result Date: 05/29/2021 CLINICAL DATA:  Initial treatment strategy for non-small lung cancer. Adenocarcinoma on bronchoscopy with station 7 involvement. EXAM: NUCLEAR MEDICINE PET SKULL BASE TO THIGH TECHNIQUE: 7.51 mCi F-18 FDG was injected intravenously. Full-ring PET imaging was performed from the skull base to thigh after the radiotracer. CT data was obtained and used for attenuation correction and anatomic localization. Fasting blood glucose: 132 mg/dl COMPARISON:  Chest CT 91/47/8295 and 02/05/2021 FINDINGS: Mediastinal blood pool activity: SUV max 2.1 NECK: No hypermetabolic cervical lymph nodes are identified.There are no lesions of the pharyngeal mucosal space. Incidental CT findings: Severe bilateral carotid atherosclerosis. CHEST: There are small nonspecific mildly hypermetabolic mediastinal and hilar lymph nodes bilaterally. Representative nodes include inferior right hilar node with an SUV max of 4.0, a left hilar node with an SUV max of 3.8 and a subcarinal node with an SUV max of  3.8. There is heterogeneous low level hypermetabolic activity throughout the previously demonstrated left lower lobe consolidation (greatest SUV max 5.3 superomedially). No hypermetabolic activity or suspicious pulmonary nodularity within the left upper lobe or the right lung. Incidental CT findings: Stable small left pleural effusion without significant hypermetabolic activity. Diffuse atherosclerosis of the aorta, great vessels and coronary arteries status post median sternotomy and CABG. There are calcifications of the aortic valve. The heart size is at the upper limits of normal. Centrilobular and paraseptal emphysema noted. ABDOMEN/PELVIS: There is no hypermetabolic activity within the liver, adrenal glands, spleen or pancreas. There is no hypermetabolic nodal activity.  Scattered bowel activity within physiologic limits. Incidental CT findings: Small calcified gallstones. Diffuse aortic and branch vessel atherosclerosis. SKELETON: There is no hypermetabolic activity to suggest osseous metastatic disease. Incidental CT findings: Multilevel spondylosis. IMPRESSION: 1. Nonspecific low-level hypermetabolic activity within the progressive mass-like consolidation in the left lower lobe corresponding with known adenocarcinoma on bronchoscopic biopsy/brushings. No suspicious pulmonary metabolic activity outside of the left lower lobe. 2. Nonspecific small mildly hypermetabolic mediastinal and hilar lymph nodes bilaterally which are suspicious for early nodal metastases given previous positive subcarinal nodal biopsy. 3. No evidence of distant metastatic disease. 4. Cholelithiasis. Coronary and aortic atherosclerosis (ICD10-I70.0). Emphysema (ICD10-J43.9). Electronically Signed   By: Carey Bullocks M.D.   On: 05/29/2021 11:47     Future Appointments  Date Time Provider Department Center  06/25/2021 12:00 PM [facility]- ECHO 1-[facility]  06/25/2021  2:00 PM WL VASC US 1[facility] [facility]-VASCL [facility]  06/26/2021 11:15 AM [facility]  MED[facility] FLUSH [facility] None  07/01/2021  1:15 PM [facility] LAB [facility] None  07/01/2021  1:45 PM Si Gaul, MD [facility] None  07/01/2021  2:30 PM [facility] INFUSION [facility] None  07/08/2021  1:00 PM [facility]ONC LAB [facility] None  07/15/2021  1:15 PM [facility]-ONC LAB [facility] None  07/22/2021  8:15 AM [facility]MED-ONC LAB [facility] None  07/22/2021  8:45 AM Si Gaul, MD [facility] None  07/22/2021  9:30 AM [facility] INFUSION [facility] None  07/29/2021  1:15 PM [facility]ONC LAB [facility] None  08/09/2021 10:40 AM Lyn Records, MD [facility]      LOS: 1 day

## 2021-06-25 NOTE — Progress Notes (Signed)
Echocardiogram 2D Echocardiogram has been performed.  [name] [name] 06/25/2021, 10:53 AM

## 2021-06-26 ENCOUNTER — Inpatient Hospital Stay: Payer: Medicare PPO

## 2021-06-26 DIAGNOSIS — I5023 Acute on chronic systolic (congestive) heart failure: Secondary | ICD-10-CM | POA: Diagnosis not present

## 2021-06-26 DIAGNOSIS — R0602 Shortness of breath: Secondary | ICD-10-CM | POA: Diagnosis not present

## 2021-06-26 DIAGNOSIS — I2510 Atherosclerotic heart disease of native coronary artery without angina pectoris: Secondary | ICD-10-CM | POA: Diagnosis not present

## 2021-06-26 LAB — COMPREHENSIVE METABOLIC PANEL
ALT: 576 U/L — ABNORMAL HIGH (ref 0–44)
AST: 287 U/L — ABNORMAL HIGH (ref 15–41)
Albumin: 2.6 g/dL — ABNORMAL LOW (ref 3.5–5.0)
Alkaline Phosphatase: 141 U/L — ABNORMAL HIGH (ref 38–126)
Anion gap: 11 (ref 5–15)
BUN: 34 mg/dL — ABNORMAL HIGH (ref 8–23)
CO2: 24 mmol/L (ref 22–32)
Calcium: 8.5 mg/dL — ABNORMAL LOW (ref 8.9–10.3)
Chloride: 96 mmol/L — ABNORMAL LOW (ref 98–111)
Creatinine, Ser: 1.45 mg/dL — ABNORMAL HIGH (ref 0.61–1.24)
GFR, Estimated: 51 mL/min — ABNORMAL LOW (ref >60)
Glucose, Bld: 158 mg/dL — ABNORMAL HIGH (ref 70–99)
Potassium: 3.7 mmol/L (ref 3.5–5.1)
Sodium: 131 mmol/L — ABNORMAL LOW (ref 135–145)
Total Bilirubin: 1.5 mg/dL — ABNORMAL HIGH (ref 0.3–1.2)
Total Protein: 5.9 g/dL — ABNORMAL LOW (ref 6.5–8.1)

## 2021-06-26 LAB — TYPE AND SCREEN
ABO/RH(D): A POS
Antibody Screen: NEGATIVE
Unit division: 0

## 2021-06-26 LAB — CBC WITH DIFFERENTIAL/PLATELET
Abs Immature Granulocytes: 0.11 10*3/uL — ABNORMAL HIGH (ref 0.00–0.07)
Basophils Absolute: 0 10*3/uL (ref 0.0–0.1)
Basophils Relative: 1 %
Eosinophils Absolute: 0 10*3/uL (ref 0.0–0.5)
Eosinophils Relative: 0 %
HCT: 27 % — ABNORMAL LOW (ref 39.0–52.0)
Hemoglobin: 9.6 g/dL — ABNORMAL LOW (ref 13.0–17.0)
Immature Granulocytes: 2 %
Lymphocytes Relative: 19 %
Lymphs Abs: 1.1 10*3/uL (ref 0.7–4.0)
MCH: 30.7 pg (ref 26.0–34.0)
MCHC: 35.6 g/dL (ref 30.0–36.0)
MCV: 86.3 fL (ref 80.0–100.0)
Monocytes Absolute: 0.8 10*3/uL (ref 0.1–1.0)
Monocytes Relative: 13 %
Neutro Abs: 3.9 10*3/uL (ref 1.7–7.7)
Neutrophils Relative %: 65 %
Platelets: 104 10*3/uL — ABNORMAL LOW (ref 150–400)
RBC: 3.13 MIL/uL — ABNORMAL LOW (ref 4.22–5.81)
RDW: 13.6 % (ref 11.5–15.5)
WBC: 5.9 10*3/uL (ref 4.0–10.5)
nRBC: 3.2 % — ABNORMAL HIGH (ref 0.0–0.2)

## 2021-06-26 LAB — BPAM RBC
Blood Product Expiration Date: 202307132359
ISSUE DATE / TIME: 202306231319
Unit Type and Rh: 6200

## 2021-06-26 LAB — HEMOGLOBIN A1C
Hgb A1c MFr Bld: 7.2 % — ABNORMAL HIGH (ref 4.8–5.6)
Mean Plasma Glucose: 159.94 mg/dL

## 2021-06-26 LAB — LACTIC ACID, PLASMA: Lactic Acid, Venous: 1.8 mmol/L (ref 0.5–1.9)

## 2021-06-26 LAB — URINE CULTURE: Culture: NO GROWTH

## 2021-06-26 LAB — MAGNESIUM: Magnesium: 2.1 mg/dL (ref 1.7–2.4)

## 2021-06-26 MED ORDER — GUAIFENESIN-DM 100-10 MG/5ML PO SYRP
5.0000 mL | ORAL_SOLUTION | ORAL | Status: DC | PRN
Start: 2021-06-26 — End: 2021-06-27
  Administered 2021-06-26: 5 mL via ORAL
  Filled 2021-06-26: qty 10

## 2021-06-26 MED ORDER — CLOPIDOGREL BISULFATE 75 MG PO TABS
75.0000 mg | ORAL_TABLET | Freq: Every day | ORAL | Status: DC
  Administered 2021-06-27: 75 mg via ORAL
  Filled 2021-06-26: qty 1

## 2021-06-26 NOTE — Progress Notes (Signed)
PROGRESS NOTE    [name] [name]  NWG:[id number] DOB: 1950-08-05 DOA: 06/24/2021 PCP: Kirby Funk, MD     Brief Narrative:  H/o CAD s/p CABG, systolic chf, NIDDM2, recently diagnosed stage III b adenocarcinoma of the lung, received first of a chemotherapy on 6 /8 with carbo/alimta, sent to ED by oncologist on 6/22 due to found to have neutropenia, anemia and thrombocytopenia, hyponatremia ,abnormal cr, LFT ,abnormal chest x-ray  Subjective:  complaining of the chest pain this morning 2/10 ,  he relates it more to indigestion right now and says he feels like need the nitro at this time Cbc is improving, resume plavix Cr fluctuating, heart failure meds per cards  Diuresed well,  he is on room air at rest   Assessment & Plan:  Principal Problem:   SOB (shortness of breath) Active Problems:   Diabetes mellitus type 2, noninsulin dependent (HCC)   Coronary artery disease involving native coronary artery of native heart with angina pectoris (HCC)   Adenocarcinoma of left lung, stage 3 (HCC)   Other pancytopenia (HCC)   Acute on chronic systolic CHF (congestive heart failure) (HCC)   AKI (acute kidney injury) (HCC)   LFT elevation   Mucositis due to chemotherapy  Acute on chronic systolic CHF -Heart failure could explain chest x-ray findings, pleural effusions on [place],  hyponatremia, elevated LFT -Patient report recent weight gain, bilateral lower extremity edema -Significantly elevated bnp,  -Mild elevated high-sensitivity troponin  - echocardiogram with new reduced lvef, venous doppler lower extremity no DVT -IV Lasix,  entresto, coreg , imdur  -cardiology consulted , meds per cardiology   CAD s/p CABG Hold statin due to elevated lft plavix held on admission due to thrombocytopenia, resume once plt improves Continue coreg, imdur, prn nitroglycerin  -reports nitroglycerin responding chest pain this am, ekg no acute st/t changes -transfuse prbc to keep hgb > 8 -cardiology  consulted    AKI Ua no sign of infection  renal US no obstructive nephropathy AKI likely from cardiorenal Cr fluctuating on iv lasix Renal dosing meds Repeat bmp in am   Hyponatremia Likely multifactorial including, hyperglycemia, volume overload, SIADH in the setting of lung cancer Correct glucose, getting lasix,  Sodium improved repeat bmp in am     Elevated lft  Could be multifactorial including liver congestion from heart failure, Could also be side effect from alimta Ck wnl, hepatitis panel unremarkable   ab US liver unremarkable, does show cholelithiasis and sludge, he denies ab pain, no n/v Hold statin Trend left      NIDDM2 with hyperglycemia A1c will not be reliable in the setting of anemia , will hold off checking a1c Home meds metformin/ozempic held Start ssi    recently diagnosed stage III b adenocarcinoma of the lung,  received first of a chemotherapy on 6 /8 with carbo/alimta, Management per oncology     Pancytopenia, likely chemo induced , improving  -Received first chemo on 6/8 with carboplatin and Alimta -anc normalized after  GCSF x2, d/c GCSF  -supportive transfusion, s/p prbc x1 on 6/23 -does not appear to have infection, blood culture negative , respiratory viral panel negative , UA no infection ,will hold off abx -appreciate oncology input  Mucositis /mouth sores Continue magic mouth wash prn    I have Reviewed nursing notes, Vitals, pain scores, I/o's, Lab results and  imaging results since pt's last encounter, details please see discussion above  I ordered the following labs:  Unresulted Labs (From admission, onward)  Start     Ordered   06/27/21 0500  Comprehensive metabolic panel  Tomorrow morning,   R       Question:  Specimen collection method  Answer:  Lab=Lab collect   06/26/21 1701   06/27/21 0500  Magnesium  Tomorrow morning,   R       Question:  Specimen collection method  Answer:  Lab=Lab collect   06/26/21 1701   06/26/21  0500  CBC with Differential/Platelet  Daily,   R     Question:  Specimen collection method  Answer:  Lab=Lab collect   06/25/21 1524             DVT prophylaxis: SCDs Start: 06/24/21 1949   Code Status:   Code Status: Full Code  Family Communication: Family at bedside with permission on 6/23 Disposition:   Dispo: The patient is from: home              Anticipated d/c is to: TBD              Anticipated d/c date is: need cr to be stable, needs cards clearance   Antimicrobials:   Received Vanco and cefepime x1 in the ED    Objective: Vitals:   06/25/21 2039 06/26/21 0500 06/26/21 0826 06/26/21 1222  BP: 118/81 119/85  113/71  Pulse: 83 90  85  Resp: 16   16  Temp: 98.5 F (36.9 C) 98.6 F (37 C)  98.6 F (37 C)  TempSrc: Oral Rectal    SpO2: 96% 97% 92% 92%  Weight:  74.3 kg    Height:        Intake/Output Summary (Last 24 hours) at 06/26/2021 1702 Last data filed at 06/26/2021 1603 Gross per 24 hour  Intake 1320 ml  Output 750 ml  Net 570 ml   Filed Weights   06/24/21 2032 06/25/21 0500 06/26/21 0500  Weight: 74.4 kg 74.3 kg 74.3 kg    Examination:  General exam: alert, awake, communicative,calm, NAD Respiratory system: diminished at basis, + crackles , Respiratory effort normal. Cardiovascular system:  RRR.  Gastrointestinal system: Abdomen is nondistended, soft and nontender.  Normal bowel sounds heard. Central nervous system: Alert and oriented. No focal neurological deficits. Extremities:  lower extremity edema has almost resolved  Skin: No rashes, lesions or ulcers Psychiatry: Judgement and insight appear normal. Mood & affect appropriate.     Data Reviewed: I have personally reviewed  labs and visualized  imaging studies since the last encounter and formulate the plan        Scheduled Meds:  carvedilol  3.125 mg Oral BID WC   [START ON 06/27/2021] clopidogrel  75 mg Oral Q breakfast   cromolyn  1 drop Both Eyes Daily   fluticasone  furoate-vilanterol  1 puff Inhalation Daily   And   umeclidinium bromide  1 puff Inhalation Daily   folic acid  1 mg Oral Daily   furosemide  60 mg Intravenous BID   insulin aspart  0-15 Units Subcutaneous TID WC   insulin aspart  0-5 Units Subcutaneous QHS   isosorbide mononitrate  15 mg Oral Daily   magic mouthwash w/lidocaine  5 mL Oral QID   ranolazine  500 mg Oral BID   sacubitril-valsartan  1 tablet Oral BID   Continuous Infusions:   LOS: 2 days     Albertine Grates, MD PhD FACP [facility]  Available via Epic secure chat 7am-7pm for nonurgent issues Please page for urgent issues To  page the attending provider between 7A-7P or the covering provider during after hours 7P-7A, please log into the web site [email or web address] and access using universal [place] password for that web site. If you do not have the password, please call the hospital operator.    06/26/2021, 5:02 PM

## 2021-06-27 DIAGNOSIS — I2510 Atherosclerotic heart disease of native coronary artery without angina pectoris: Secondary | ICD-10-CM | POA: Diagnosis not present

## 2021-06-27 DIAGNOSIS — R0602 Shortness of breath: Secondary | ICD-10-CM | POA: Diagnosis not present

## 2021-06-27 DIAGNOSIS — I5023 Acute on chronic systolic (congestive) heart failure: Secondary | ICD-10-CM | POA: Diagnosis not present

## 2021-06-27 LAB — COMPREHENSIVE METABOLIC PANEL
ALT: 437 U/L — ABNORMAL HIGH (ref 0–44)
AST: 123 U/L — ABNORMAL HIGH (ref 15–41)
Albumin: 2.7 g/dL — ABNORMAL LOW (ref 3.5–5.0)
Alkaline Phosphatase: 144 U/L — ABNORMAL HIGH (ref 38–126)
Anion gap: 10 (ref 5–15)
BUN: 34 mg/dL — ABNORMAL HIGH (ref 8–23)
CO2: 25 mmol/L (ref 22–32)
Calcium: 8.6 mg/dL — ABNORMAL LOW (ref 8.9–10.3)
Chloride: 98 mmol/L (ref 98–111)
Creatinine, Ser: 1.44 mg/dL — ABNORMAL HIGH (ref 0.61–1.24)
GFR, Estimated: 51 mL/min — ABNORMAL LOW (ref >60)
Glucose, Bld: 171 mg/dL — ABNORMAL HIGH (ref 70–99)
Potassium: 3.3 mmol/L — ABNORMAL LOW (ref 3.5–5.1)
Sodium: 133 mmol/L — ABNORMAL LOW (ref 135–145)
Total Bilirubin: 1.6 mg/dL — ABNORMAL HIGH (ref 0.3–1.2)
Total Protein: 5.8 g/dL — ABNORMAL LOW (ref 6.5–8.1)

## 2021-06-27 LAB — CBC WITH DIFFERENTIAL/PLATELET
Abs Immature Granulocytes: 0.81 10*3/uL — ABNORMAL HIGH (ref 0.00–0.07)
Basophils Absolute: 0.1 10*3/uL (ref 0.0–0.1)
Basophils Relative: 1 %
Eosinophils Absolute: 0 10*3/uL (ref 0.0–0.5)
Eosinophils Relative: 0 %
HCT: 27.2 % — ABNORMAL LOW (ref 39.0–52.0)
Hemoglobin: 9.5 g/dL — ABNORMAL LOW (ref 13.0–17.0)
Immature Granulocytes: 5 %
Lymphocytes Relative: 12 %
Lymphs Abs: 1.9 10*3/uL (ref 0.7–4.0)
MCH: 30.6 pg (ref 26.0–34.0)
MCHC: 34.9 g/dL (ref 30.0–36.0)
MCV: 87.7 fL (ref 80.0–100.0)
Monocytes Absolute: 1.7 10*3/uL — ABNORMAL HIGH (ref 0.1–1.0)
Monocytes Relative: 11 %
Neutro Abs: 10.6 10*3/uL — ABNORMAL HIGH (ref 1.7–7.7)
Neutrophils Relative %: 71 %
Platelets: 141 10*3/uL — ABNORMAL LOW (ref 150–400)
RBC: 3.1 MIL/uL — ABNORMAL LOW (ref 4.22–5.81)
RDW: 13.8 % (ref 11.5–15.5)
WBC: 15 10*3/uL — ABNORMAL HIGH (ref 4.0–10.5)
nRBC: 1.5 % — ABNORMAL HIGH (ref 0.0–0.2)

## 2021-06-27 LAB — MAGNESIUM: Magnesium: 2 mg/dL (ref 1.7–2.4)

## 2021-06-27 MED ORDER — ATORVASTATIN CALCIUM 80 MG PO TABS: 80.0000 mg | ORAL_TABLET | Freq: Every day | ORAL | 3 refills | Status: AC

## 2021-06-27 MED ORDER — ASPIRIN 81 MG PO TABS: 81.0000 mg | ORAL_TABLET | Freq: Every day | ORAL | Status: AC

## 2021-06-27 MED ORDER — ISOSORBIDE MONONITRATE ER 60 MG PO TB24: 60.0000 mg | ORAL_TABLET | Freq: Every day | ORAL | 3 refills | Status: AC

## 2021-06-27 MED ORDER — CARVEDILOL 6.25 MG PO TABS
6.2500 mg | ORAL_TABLET | Freq: Two times a day (BID) | ORAL | 3 refills | Status: AC
Start: 2021-06-27 — End: ?

## 2021-06-27 MED ORDER — CARVEDILOL 3.125 MG PO TABS: 3.1250 mg | ORAL_TABLET | Freq: Two times a day (BID) | ORAL | Status: DC

## 2021-06-27 MED ORDER — FUROSEMIDE 40 MG PO TABS
40.0000 mg | ORAL_TABLET | Freq: Every day | ORAL | Status: DC
  Administered 2021-06-27: 40 mg via ORAL
  Filled 2021-06-27: qty 1

## 2021-06-27 MED ORDER — ACETAMINOPHEN 500 MG PO TABS: 500.0000 mg | ORAL_TABLET | Freq: Three times a day (TID) | ORAL | 0 refills | Status: AC | PRN

## 2021-06-27 MED ORDER — FUROSEMIDE 40 MG PO TABS: 40.0000 mg | ORAL_TABLET | Freq: Every day | ORAL | 3 refills | Status: AC

## 2021-06-27 MED ORDER — POTASSIUM CHLORIDE 20 MEQ PO PACK
40.0000 meq | PACK | Freq: Once | ORAL | Status: AC
  Administered 2021-06-27: 40 meq via ORAL
  Filled 2021-06-27: qty 2

## 2021-06-28 LAB — GLUCOSE, CAPILLARY
Glucose-Capillary: 291 mg/dL — ABNORMAL HIGH (ref 70–99)
Glucose-Capillary: 340 mg/dL — ABNORMAL HIGH (ref 70–99)
Glucose-Capillary: 175 mg/dL — ABNORMAL HIGH (ref 70–99)
Glucose-Capillary: 265 mg/dL — ABNORMAL HIGH (ref 70–99)
Glucose-Capillary: 231 mg/dL — ABNORMAL HIGH (ref 70–99)
Glucose-Capillary: 115 mg/dL — ABNORMAL HIGH (ref 70–99)
Glucose-Capillary: 327 mg/dL — ABNORMAL HIGH (ref 70–99)
Glucose-Capillary: 181 mg/dL — ABNORMAL HIGH (ref 70–99)

## 2021-06-29 LAB — CULTURE, BLOOD (ROUTINE X 2)
Culture: NO GROWTH
Culture: NO GROWTH
Special Requests: ADEQUATE
Special Requests: ADEQUATE

## 2021-06-30 MED FILL — Dexamethasone Sodium Phosphate Inj 100 MG/10ML: INTRAMUSCULAR | Qty: 1 | Status: AC

## 2021-06-30 MED FILL — Fosaprepitant Dimeglumine For IV Infusion 150 MG (Base Eq): INTRAVENOUS | Qty: 5 | Status: AC

## 2021-07-01 ENCOUNTER — Inpatient Hospital Stay: Payer: Medicare PPO | Admitting: Internal Medicine

## 2021-07-01 ENCOUNTER — Inpatient Hospital Stay: Payer: Medicare PPO

## 2021-07-01 ENCOUNTER — Encounter: Payer: Self-pay | Admitting: Internal Medicine

## 2021-07-01 ENCOUNTER — Other Ambulatory Visit: Payer: Self-pay

## 2021-07-01 VITALS — BP 131/95 | HR 105 | Temp 97.5°F | Resp 17 | Ht 66.0 in | Wt 154.7 lb

## 2021-07-01 VITALS — HR 99

## 2021-07-01 DIAGNOSIS — D701 Agranulocytosis secondary to cancer chemotherapy: Secondary | ICD-10-CM | POA: Diagnosis not present

## 2021-07-01 DIAGNOSIS — K1379 Other lesions of oral mucosa: Secondary | ICD-10-CM | POA: Diagnosis not present

## 2021-07-01 DIAGNOSIS — C3432 Malignant neoplasm of lower lobe, left bronchus or lung: Secondary | ICD-10-CM | POA: Diagnosis not present

## 2021-07-01 DIAGNOSIS — I739 Peripheral vascular disease, unspecified: Secondary | ICD-10-CM

## 2021-07-01 DIAGNOSIS — R197 Diarrhea, unspecified: Secondary | ICD-10-CM | POA: Diagnosis not present

## 2021-07-01 DIAGNOSIS — C3492 Malignant neoplasm of unspecified part of left bronchus or lung: Secondary | ICD-10-CM

## 2021-07-01 DIAGNOSIS — E119 Type 2 diabetes mellitus without complications: Secondary | ICD-10-CM

## 2021-07-01 DIAGNOSIS — Z5111 Encounter for antineoplastic chemotherapy: Secondary | ICD-10-CM | POA: Diagnosis not present

## 2021-07-01 DIAGNOSIS — D61818 Other pancytopenia: Secondary | ICD-10-CM

## 2021-07-01 DIAGNOSIS — E1165 Type 2 diabetes mellitus with hyperglycemia: Secondary | ICD-10-CM | POA: Diagnosis not present

## 2021-07-01 DIAGNOSIS — Z79899 Other long term (current) drug therapy: Secondary | ICD-10-CM | POA: Diagnosis not present

## 2021-07-01 LAB — CBC WITH DIFFERENTIAL (CANCER CENTER ONLY)
Abs Immature Granulocytes: 2.24 10*3/uL — ABNORMAL HIGH (ref 0.00–0.07)
Basophils Absolute: 0 10*3/uL (ref 0.0–0.1)
Basophils Relative: 0 %
Eosinophils Absolute: 0 10*3/uL (ref 0.0–0.5)
Eosinophils Relative: 0 %
HCT: 30.4 % — ABNORMAL LOW (ref 39.0–52.0)
Hemoglobin: 10.3 g/dL — ABNORMAL LOW (ref 13.0–17.0)
Immature Granulocytes: 15 %
Lymphocytes Relative: 6 %
Lymphs Abs: 0.9 10*3/uL (ref 0.7–4.0)
MCH: 30.9 pg (ref 26.0–34.0)
MCHC: 33.9 g/dL (ref 30.0–36.0)
MCV: 91.3 fL (ref 80.0–100.0)
Monocytes Absolute: 1 10*3/uL (ref 0.1–1.0)
Monocytes Relative: 7 %
Neutro Abs: 10.3 10*3/uL — ABNORMAL HIGH (ref 1.7–7.7)
Neutrophils Relative %: 72 %
Platelet Count: 246 10*3/uL (ref 150–400)
RBC: 3.33 MIL/uL — ABNORMAL LOW (ref 4.22–5.81)
RDW: 15.9 % — ABNORMAL HIGH (ref 11.5–15.5)
WBC Count: 14.5 10*3/uL — ABNORMAL HIGH (ref 4.0–10.5)
nRBC: 0.3 % — ABNORMAL HIGH (ref 0.0–0.2)

## 2021-07-01 LAB — CMP (CANCER CENTER ONLY)
ALT: 164 U/L — ABNORMAL HIGH (ref 0–44)
AST: 29 U/L (ref 15–41)
Albumin: 3.6 g/dL (ref 3.5–5.0)
Alkaline Phosphatase: 134 U/L — ABNORMAL HIGH (ref 38–126)
Anion gap: 17 — ABNORMAL HIGH (ref 5–15)
BUN: 28 mg/dL — ABNORMAL HIGH (ref 8–23)
CO2: 18 mmol/L — ABNORMAL LOW (ref 22–32)
Calcium: 9.6 mg/dL (ref 8.9–10.3)
Chloride: 91 mmol/L — ABNORMAL LOW (ref 98–111)
Creatinine: 1.32 mg/dL — ABNORMAL HIGH (ref 0.61–1.24)
GFR, Estimated: 57 mL/min — ABNORMAL LOW (ref >60)
Glucose, Bld: 392 mg/dL — ABNORMAL HIGH (ref 70–99)
Potassium: 4.6 mmol/L (ref 3.5–5.1)
Sodium: 126 mmol/L — ABNORMAL LOW (ref 135–145)
Total Bilirubin: 1.1 mg/dL (ref 0.3–1.2)
Total Protein: 7.2 g/dL (ref 6.5–8.1)

## 2021-07-01 LAB — SAMPLE TO BLOOD BANK

## 2021-07-01 MED ORDER — SODIUM CHLORIDE 0.9 % IV SOLN
364.5000 mg | Freq: Once | INTRAVENOUS | Status: AC
  Administered 2021-07-01: 360 mg via INTRAVENOUS
  Filled 2021-07-01: qty 36

## 2021-07-01 MED ORDER — SODIUM CHLORIDE 0.9 % IV SOLN: Freq: Once | INTRAVENOUS | Status: AC

## 2021-07-01 MED ORDER — PALONOSETRON HCL INJECTION 0.25 MG/5ML
0.2500 mg | Freq: Once | INTRAVENOUS | Status: AC
  Administered 2021-07-01: 0.25 mg via INTRAVENOUS
  Filled 2021-07-01: qty 5

## 2021-07-01 MED ORDER — SODIUM CHLORIDE 0.9 % IV SOLN
150.0000 mg | Freq: Once | INTRAVENOUS | Status: AC
  Administered 2021-07-01: 150 mg via INTRAVENOUS
  Filled 2021-07-01: qty 150

## 2021-07-01 MED ORDER — INSULIN ASPART 100 UNIT/ML IJ SOLN
10.0000 [IU] | Freq: Once | INTRAMUSCULAR | Status: AC
  Administered 2021-07-01: 10 [IU] via SUBCUTANEOUS
  Filled 2021-07-01: qty 1

## 2021-07-01 MED ORDER — SODIUM CHLORIDE 0.9 % IV SOLN
500.0000 mg/m2 | Freq: Once | INTRAVENOUS | Status: AC
  Administered 2021-07-01: 900 mg via INTRAVENOUS
  Filled 2021-07-01: qty 20

## 2021-07-01 MED ORDER — SODIUM CHLORIDE 0.9 % IV SOLN
10.0000 mg | Freq: Once | INTRAVENOUS | Status: AC
  Administered 2021-07-01: 10 mg via INTRAVENOUS
  Filled 2021-07-01: qty 10

## 2021-07-01 NOTE — Patient Instructions (Signed)
[facility]  Discharge Instructions: Thank you for choosing [facility] to provide your oncology and hematology care.   If you have a lab appointment with the [facility], please go directly to the [facility] and check in at the registration area.   Wear comfortable clothing and clothing appropriate for easy access to any Portacath or PICC line.   We strive to give you quality time with your provider. You may need to reschedule your appointment if you arrive late (15 or more minutes).  Arriving late affects you and other patients whose appointments are after yours.  Also, if you miss three or more appointments without notifying the office, you may be dismissed from the clinic at the provider's discretion.      For prescription refill requests, have your pharmacy contact our office and allow 72 hours for refills to be completed.    Today you received the following chemotherapy and/or immunotherapy agents: Pemetrexed (Alimta) and Carboplatin.   To help prevent nausea and vomiting after your treatment, we encourage you to take your nausea medication as directed.  BELOW ARE SYMPTOMS THAT SHOULD BE REPORTED IMMEDIATELY: *FEVER GREATER THAN 100.4 F (38 C) OR HIGHER *CHILLS OR SWEATING *NAUSEA AND VOMITING THAT IS NOT CONTROLLED WITH YOUR NAUSEA MEDICATION *UNUSUAL SHORTNESS OF BREATH *UNUSUAL BRUISING OR BLEEDING *URINARY PROBLEMS (pain or burning when urinating, or frequent urination) *BOWEL PROBLEMS (unusual diarrhea, constipation, pain near the anus) TENDERNESS IN MOUTH AND THROAT WITH OR WITHOUT PRESENCE OF ULCERS (sore throat, sores in mouth, or a toothache) UNUSUAL RASH, SWELLING OR PAIN  UNUSUAL VAGINAL DISCHARGE OR ITCHING   Items with * indicate a potential emergency and should be followed up as soon as possible or go to the Emergency Department if any problems should occur.  Please show the CHEMOTHERAPY ALERT CARD or IMMUNOTHERAPY  ALERT CARD at check-in to the Emergency Department and triage nurse.  Should you have questions after your visit or need to cancel or reschedule your appointment, please contact [facility]  Dept: [phone]  and follow the prompts.  Office hours are 8:00 a.m. to 4:30 p.m. Monday - Friday. Please note that voicemails left after 4:00 p.m. may not be returned until the following business day.  We are closed weekends and major holidays. You have access to a nurse at all times for urgent questions. Please call the main number to the clinic Dept: [phone] and follow the prompts.   For any non-urgent questions, you may also contact your provider using MyChart. We now offer e-Visits for anyone 36 and older to request care online for non-urgent symptoms. For details visit [email or web address].   Also download the MyChart app! Go to the app store, search "MyChart", open the app, select [facility], and log in with your MyChart username and password.  Masks are optional in the cancer centers. If you would like for your care team to wear a mask while they are taking care of you, please let them know. For doctor visits, patients may have with them one support person who is at least 73 years old. At this time, visitors are not allowed in the infusion area.

## 2021-07-01 NOTE — Progress Notes (Signed)
[place] [phone]   [phone]  OFFICE PROGRESS NOTE  [name] Orn, MD [address] [name] [address]  DIAGNOSIS:  Stage IIIb (T4, N2, M0) non-small cell lung cancer, adenocarcinoma presented with large left lower lobe lung mass in addition to subcarinal lymphadenopathy diagnosed in May 2023 and pending further staging work-up. His molecular studies by [email or web address] showed no actionable mutations and PD-L1 expression is 2%.  PRIOR THERAPY: None  CURRENT THERAPY: Induction systemic chemotherapy with carboplatin for AUC of 5 and Alimta 500 Mg/M2 every 3 weeks for 3 cycles.  First cycle expected on 06/10/2021.  Status post 1 cycle  INTERVAL HISTORY: [name] [name] 71 y.o. male returns to the clinic today for follow-up visit accompanied by his wife.  The patient is feeling fine today with no concerning complaints.  The patient was recently admitted to the hospital with congestive heart failure and neutropenia as well as liver dysfunction.  He received Granix during his hospitalization.  He was also treated for congestive heart failure and had improvement in his condition.  His blood sugar has been uncontrolled recently because of the steroid treatment.  He denied having any current chest pain but has mild cough with no hemoptysis.  He has no fever or chills.  He has no nausea, vomiting, diarrhea or constipation.  He has no headache or visual changes.  He is here today for evaluation before starting cycle #2 of his treatment.   MEDICAL HISTORY: Past Medical History:  Diagnosis Date   Anxiety    Arthritis    Chest pain    a. experience weekly, nitrate responsive c/p x several years.   b. ranexa added on 08/2014 admission    Chronic combined systolic and diastolic CHF (congestive heart failure) ([place])    Claudication ([place])    a. 08/2014 ABI: R 0.79, L 1.0-->Walking program instituted.   Coronary artery disease    a. s/p MI in 1989;  b.  04/2008 CABG x 5 (LIMA->LAD, VG->D1, VG->RI, VG->PDA, L Radial->OM2);  c. 06/2010 PCI/DES to the Diag (2.25x28 Promus Element DES). VG->Diag occluded;  d. 08/2010 Attempted PTCA of the OM2 via the free radial graft. Unsuccessful.  Lesion felt to be 2/2 suture.  c. NSTEMI s/p [facility] with severe native and graph dz; D1 that was stented in 2012 now occulded: Rx therapy recommended   Coronary artery disease involving native coronary artery of native heart with angina pectoris ([place])    a. s/p MI in 1989;  b. 04/2008 CABG x 5 (LIMA->LAD, VG->D1, VG->RI, VG->PDA, L Radial->OM2);  c. 06/2010 PCI/DES to the Diag (2.25x28 Promus Element DES). VG->Diag occluded;  d. 08/2010 Attempted PTCA of the OM2 via the free radial graft. Unsuccessful.  Lesion felt to be 2/2 suture.  c. NSTEMI s/p [facility] with severe native and graph dz; Rx therapy recommended    COVID 2021   mild case   Diabetes mellitus ([place]) 06/02/2011   type 2   Hyperlipidemia    Hypertension    NSTEMI (non-ST elevated myocardial infarction) ([place]) 08/29/2014   Peripheral arterial disease ([place]) 08/16/2014   Pneumonia    Type II diabetes mellitus ([place])     ALLERGIES:  is allergic to aldactone [spironolactone], amlodipine, farxiga [dapagliflozin], percocet [oxycodone-acetaminophen], trulicity [dulaglutide], avandia [rosiglitazone], and codeine.  MEDICATIONS:  Current Outpatient Medications  Medication Sig Dispense Refill   ACCU-CHEK GUIDE test strip      Accu-Chek Softclix Lancets lancets  acetaminophen (TYLENOL) 500 MG tablet Take 1 tablet (500 mg total) by mouth every 8 (eight) hours as needed for mild pain. Please do not take tylenol if your liver function is abnormal 30 tablet 0   ALPRAZolam (XANAX) 0.25 MG tablet Take 0.25 mg by mouth at bedtime as needed for anxiety (restless legs).     aspirin 81 MG tablet Take 1 tablet (81 mg total) by mouth daily. Hold if you have bleeding or low platelet 30 tablet    atorvastatin (LIPITOR) 80 MG tablet Take 1  tablet (80 mg total) by mouth daily. Hold this medication, can resume when your liver function become normal 90 tablet 3   Azelastine HCl 0.15 % SOLN Place 1 spray into both nostrils daily as needed (Allergies).     Blood Glucose Monitoring Suppl (ACCU-CHEK GUIDE) w/Device KIT      carvedilol (COREG) 6.25 MG tablet Take 1 tablet (6.25 mg total) by mouth 2 (two) times daily with a meal. Please take half a tab twice a day for the next few days, please check your blood pressure at home, bring record for your pcp to review, your pcp to decide on when you can go back on one full tab twice a day 180 tablet 3   cholecalciferol (VITAMIN D) 1000 UNITS tablet Take 1,000 Units by mouth daily.      clopidogrel (PLAVIX) 75 MG tablet Take 75 mg by mouth daily with breakfast.     cromolyn (OPTICROM) 4 % ophthalmic solution Place 1 drop into both eyes daily.     dexamethasone (DECADRON) 4 MG tablet 4 mg p.o. twice daily the day before, day of and day after the chemotherapy every 3 weeks (Patient taking differently: Take 4 mg by mouth See admin instructions. Take 4 mg by mouth two times a day the day before, day of, and day after chemo, every three weeks) 20 tablet 0   ezetimibe (ZETIA) 10 MG tablet TAKE 1 TABLET EVERY DAY (Patient taking differently: Take 10 mg by mouth daily.) 90 tablet 3   fluticasone (FLONASE) 50 MCG/ACT nasal spray Place 2 sprays into the nose See admin instructions. Instill 2 sprays into each nostril one to two times a day     Fluticasone-Umeclidin-Vilant (TRELEGY ELLIPTA) 100-62.5-25 MCG/ACT AEPB Inhale 1 puff into the lungs daily. 1 each 0   folic acid (FOLVITE) 1 MG tablet Take 1 tablet (1 mg total) by mouth daily. 30 tablet 4   furosemide (LASIX) 40 MG tablet Take 1 tablet (40 mg total) by mouth daily. Please continue to weight yourself daily, and check your feet/ankle/legs Please take extra dose of lasix if your weight go up and your legs are swollen, please call your doctor if your have to  take extra dose of lasix due to weight gain and being swollen 90 tablet 3   isosorbide mononitrate (IMDUR) 30 MG 24 hr tablet Take one tablet by mouth once daily along with your Imdur 69m tablet for a total of 981m90 tablet 3   isosorbide mononitrate (IMDUR) 60 MG 24 hr tablet Take 1 tablet (60 mg total) by mouth daily. Please hold this medication, please check your blood pressure at home, bring record to your pcp next week for further blood pressure meds adjustment 90 tablet 3   loratadine (CLARITIN) 10 MG tablet Take 10 mg by mouth daily.     magic mouthwash SOLN Take 5 mLs by mouth 4 (four) times daily as needed for mouth pain (for sore mouth).  240 mL 1   metFORMIN (GLUCOPHAGE) 1000 MG tablet Take 1,000 mg by mouth 2 (two) times daily with a meal.     nitroGLYCERIN (NITROSTAT) 0.4 MG SL tablet Place 0.4 mg under the tongue every 5 (five) minutes x 3 doses as needed for chest pain.     Polyvinyl Alcohol-Povidone (CLEAR EYES ALL SEASONS OP) Place 1 drop into both eyes daily as needed (for dry eyes).     prochlorperazine (COMPAZINE) 10 MG tablet Take 1 tablet (10 mg total) by mouth every 6 (six) hours as needed for nausea or vomiting. 30 tablet 0   ranolazine (RANEXA) 500 MG 12 hr tablet TAKE 1 TABLET TWICE DAILY (Patient taking differently: Take 500 mg by mouth 2 (two) times daily.) 180 tablet 2   sacubitril-valsartan (ENTRESTO) 97-103 MG Take 1 tablet by mouth 2 (two) times daily. 60 tablet 11   Semaglutide,0.25 or 0.5MG/DOS, (OZEMPIC, 0.25 OR 0.5 MG/DOSE,) 2 MG/1.5ML SOPN Inject 0.25 mg into the skin every Saturday.     STIOLTO RESPIMAT 2.5-2.5 MCG/ACT AERS Inhale 1 each into the lungs daily.     No current facility-administered medications for this visit.    SURGICAL HISTORY:  Past Surgical History:  Procedure Laterality Date   BRONCHIAL BIOPSY  05/04/2021   Procedure: BRONCHIAL BIOPSIES;  Surgeon: Garner Nash, DO;  Location: [facility];  Service: Pulmonary;;   BRONCHIAL BRUSHINGS   05/04/2021   Procedure: BRONCHIAL BRUSHINGS;  Surgeon: Garner Nash, DO;  Location: [facility];  Service: Pulmonary;;   BRONCHIAL WASHINGS  05/04/2021   Procedure: BRONCHIAL WASHINGS;  Surgeon: Garner Nash, DO;  Location: [place];  Service: Pulmonary;;   CARDIAC CATHETERIZATION N/A 08/28/2014   Procedure: Left Heart Cath and Cors/Grafts Angiography;  Surgeon: Belva Crome, MD;  Location: [facility];  Service: Cardiovascular;  Laterality: N/A;   COLONOSCOPY     CORONARY ANGIOPLASTY WITH STENT PLACEMENT     CORONARY ARTERY BYPASS GRAFT     FINE NEEDLE ASPIRATION  05/04/2021   Procedure: FINE NEEDLE ASPIRATION (FNA) LINEAR;  Surgeon: Garner Nash, DO;  Location: [facility];  Service: Pulmonary;;   LEFT HEART CATH AND CORS/GRAFTS ANGIOGRAPHY N/A 03/20/2018   Procedure: LEFT HEART CATH AND CORS/GRAFTS ANGIOGRAPHY;  Surgeon: Martinique, Peter M, MD;  Location: [facility];  Service: Cardiovascular;  Laterality: N/A;   PERIPHERAL VASCULAR CATHETERIZATION N/A 07/15/2015   Procedure: Abdominal Aortogram w/Lower Extremity;  Surgeon: Wellington Hampshire, MD;  Location: [facility];  Service: Cardiovascular;  Laterality: N/A;   PERIPHERAL VASCULAR CATHETERIZATION Bilateral 07/15/2015   Procedure: Peripheral Vascular Intervention;  Surgeon: Wellington Hampshire, MD;  Location: [facility];  Service: Cardiovascular;  Laterality: Bilateral;  Common Iliacs   SHOULDER ARTHROSCOPY WITH ROTATOR CUFF REPAIR AND SUBACROMIAL DECOMPRESSION Left 05/23/2013   Procedure: LEFT SHOULDER ARTHROSCOPY SUBACROMIAL  DECOMPRESSION DISTAL CLAVICLE RESECTION AND ROTATOR CUFF REPAIR ;  Surgeon: Marin Shutter, MD;  Location: [facility];  Service: Orthopedics;  Laterality: Left;   SHOULDER SURGERY Right 7-8 years ago   Dr. Dorna Leitz   VIDEO BRONCHOSCOPY WITH ENDOBRONCHIAL ULTRASOUND Bilateral 05/04/2021   Procedure: VIDEO BRONCHOSCOPY WITH ENDOBRONCHIAL ULTRASOUND;  Surgeon: Garner Nash, DO;  Location:  [place];  Service: Pulmonary;  Laterality: Bilateral;    REVIEW OF SYSTEMS:  Constitutional: positive for fatigue Eyes: negative Ears, nose, mouth, throat, and face: negative Respiratory: positive for cough Cardiovascular: negative Gastrointestinal: negative Genitourinary:negative Integument/breast: negative Hematologic/lymphatic: negative Musculoskeletal:positive for arthralgias Neurological:  negative Behavioral/Psych: negative Endocrine: negative Allergic/Immunologic: negative   PHYSICAL EXAMINATION: General appearance: alert, cooperative, and no distress Head: Normocephalic, without obvious abnormality, atraumatic Neck: no adenopathy, no JVD, supple, symmetrical, trachea midline, and thyroid not enlarged, symmetric, no tenderness/mass/nodules Lymph nodes: Cervical, supraclavicular, and axillary nodes normal. Resp: clear to auscultation bilaterally Back: symmetric, no curvature. ROM normal. No CVA tenderness. Cardio: regular rate and rhythm, S1, S2 normal, no murmur, click, rub or gallop GI: soft, non-tender; bowel sounds normal; no masses,  no organomegaly Extremities: extremities normal, atraumatic, no cyanosis or edema Neurologic: Alert and oriented X 3, normal strength and tone. Normal symmetric reflexes. Normal coordination and gait  ECOG PERFORMANCE STATUS: 1 - Symptomatic but completely ambulatory  Blood pressure (!) 131/95, pulse (!) 105, temperature (!) 97.5 F (36.4 C), resp. rate 17, height _0  (1.676 m), weight 154 lb 11.2 oz (70.2 kg), SpO2 99 %.  LABORATORY DATA: Lab Results  Component Value Date   WBC 14.5 (H) 07/01/2021   HGB 10.3 (L) 07/01/2021   HCT 30.4 (L) 07/01/2021   MCV 91.3 07/01/2021   PLT 246 07/01/2021      Chemistry      Component Value Date/Time   NA 133 (L) 06/27/2021 0354   NA 127 (L) 03/24/2021 1025   K 3.3 (L) 06/27/2021 0354   CL 98 06/27/2021 0354   CO2 25 06/27/2021 0354   BUN 34 (H) 06/27/2021 0354   BUN 13 03/24/2021  1025   CREATININE 1.44 (H) 06/27/2021 0354   CREATININE 1.27 (H) 06/24/2021 1322   CREATININE 0.88 11/13/2015 1131      Component Value Date/Time   CALCIUM 8.6 (L) 06/27/2021 0354   ALKPHOS 144 (H) 06/27/2021 0354   AST 123 (H) 06/27/2021 0354   AST 484 (HH) 06/24/2021 1322   ALT 437 (H) 06/27/2021 0354   ALT 589 (HH) 06/24/2021 1322   BILITOT 1.6 (H) 06/27/2021 0354   BILITOT 1.3 (H) 06/24/2021 1322       RADIOGRAPHIC STUDIES: VAS [place] LOWER EXTREMITY VENOUS (DVT)  Result Date: 06/25/2021  Lower Venous DVT Study Patient Name:  JERRE VANDRUNEN  Date of Exam:   06/25/2021 Medical Rec #: [id number]     Accession #:    [id number] Date of Birth: Jul 31, 1950     Patient Gender: M Patient Age:   71 years Exam Location:  [facility] Procedure:      VAS [place] LOWER EXTREMITY VENOUS (DVT) Referring Phys: Annamaria Boots XU --------------------------------------------------------------------------------  Indications: SOB.  Risk Factors: Chemotherapy. Comparison Study: No previous exams Performing Technologist: Jody Hill RVT, RDMS  Examination Guidelines: A complete evaluation includes B-mode imaging, spectral Doppler, color Doppler, and power Doppler as needed of all accessible portions of each vessel. Bilateral testing is considered an integral part of a complete examination. Limited examinations for reoccurring indications may be performed as noted. The reflux portion of the exam is performed with the patient in reverse Trendelenburg.  +---------+---------------+---------+-----------+----------+--------------+ RIGHT    CompressibilityPhasicitySpontaneityPropertiesThrombus Aging +---------+---------------+---------+-----------+----------+--------------+ CFV      Full           No       Yes                                 +---------+---------------+---------+-----------+----------+--------------+ SFJ      Full                                                         +---------+---------------+---------+-----------+----------+--------------+  FV Prox  Full           No       Yes                                 +---------+---------------+---------+-----------+----------+--------------+ FV Mid   Full           No       Yes                                 +---------+---------------+---------+-----------+----------+--------------+ FV DistalFull           No       Yes                                 +---------+---------------+---------+-----------+----------+--------------+ PFV      Full                                                        +---------+---------------+---------+-----------+----------+--------------+ POP      Full           No       Yes                                 +---------+---------------+---------+-----------+----------+--------------+ PTV      Full                                                        +---------+---------------+---------+-----------+----------+--------------+ PERO     Full                                                        +---------+---------------+---------+-----------+----------+--------------+   +---------+---------------+---------+-----------+----------+--------------+ LEFT     CompressibilityPhasicitySpontaneityPropertiesThrombus Aging +---------+---------------+---------+-----------+----------+--------------+ CFV      Full           No       Yes                                 +---------+---------------+---------+-----------+----------+--------------+ SFJ      Full                                                        +---------+---------------+---------+-----------+----------+--------------+ FV Prox  Full           No       Yes                                 +---------+---------------+---------+-----------+----------+--------------+ FV Mid  Full           No       Yes                                  +---------+---------------+---------+-----------+----------+--------------+ FV DistalFull           No       Yes                                 +---------+---------------+---------+-----------+----------+--------------+ PFV      Full                                                        +---------+---------------+---------+-----------+----------+--------------+ POP      Full           No       Yes                                 +---------+---------------+---------+-----------+----------+--------------+ PTV      Full                                                        +---------+---------------+---------+-----------+----------+--------------+ PERO     Full                                                        +---------+---------------+---------+-----------+----------+--------------+     Summary: BILATERAL: - No evidence of deep vein thrombosis seen in the lower extremities, bilaterally. -Pulsatile flow seen bilaterally.  - RIGHT: - No cystic structure found in the popliteal fossa.  LEFT: - A cystic structure is found in the popliteal fossa (4.48 x 1.52 x 2.87 cm).  *See table(s) above for measurements and observations. Electronically signed by Monica Martinez MD on 06/25/2021 at 6:18:31 PM.    Final    ECHOCARDIOGRAM COMPLETE  Result Date: 06/25/2021    ECHOCARDIOGRAM REPORT   Patient Name:   ELLIAS MCELREATH Date of Exam: 06/25/2021 Medical Rec #:  [id number]    Height:       66.0 in Accession #:    [id number]   Weight:       163.8 lb Date of Birth:  05-03-1950    BSA:          1.837 m Patient Age:    83 years     BP:           122/86 mmHg Patient Gender: M            HR:           97 bpm. Exam Location:  Inpatient Procedure: 2D Echo, Color Doppler and Cardiac Doppler Indications:    CHF- Systolic  History:        Patient has prior history of Echocardiogram examinations, most  recent 12/23/2020. CAD; Prior CABG. Chemo.  Sonographer:    Joette Catching RCS  Referring Phys: [id number] [place]  1. Left ventricular ejection fraction, by estimation, is 25 to 30%. The left ventricle has severely decreased function. The left ventricle demonstrates regional wall motion abnormalities (see scoring diagram/findings for description). The left ventricular internal cavity size was mildly dilated. Left ventricular diastolic parameters are consistent with Grade III diastolic dysfunction (restrictive). There is severe akinesis of the left ventricular, entire inferior wall.  2. Right ventricular systolic function is severely reduced. The right ventricular size is moderately enlarged.  3. The mitral valve is grossly normal. Moderate mitral valve regurgitation.  4. The aortic valve is calcified. Aortic valve regurgitation is trivial. Mild to moderate aortic valve stenosis.  5. The inferior vena cava is normal in size with <50% respiratory variability, suggesting right atrial pressure of 8 mmHg. FINDINGS  Left Ventricle: Left ventricular ejection fraction, by estimation, is 25 to 30%. The left ventricle has severely decreased function. The left ventricle demonstrates regional wall motion abnormalities. Severe akinesis of the left ventricular, entire inferior wall. The left ventricular internal cavity size was mildly dilated. There is no left ventricular hypertrophy. Left ventricular diastolic parameters are consistent with Grade III diastolic dysfunction (restrictive). Right Ventricle: The right ventricular size is moderately enlarged. Right vetricular wall thickness was not well visualized. Right ventricular systolic function is severely reduced. Left Atrium: Left atrial size was normal in size. Right Atrium: Right atrial size was normal in size. Pericardium: There is no evidence of pericardial effusion. Mitral Valve: The mitral valve is grossly normal. Moderate mitral valve regurgitation. Tricuspid Valve: The tricuspid valve is normal in structure. Tricuspid valve regurgitation  is mild. Aortic Valve: The aortic valve is calcified. Aortic valve regurgitation is trivial. Aortic regurgitation PHT measures 266 msec. Mild to moderate aortic stenosis is present. Aortic valve mean gradient measures 14.0 mmHg. Aortic valve peak gradient measures 22.9 mmHg. Aortic valve area, by VTI measures 0.70 cm. Pulmonic Valve: The pulmonic valve was normal in structure. Pulmonic valve regurgitation is trivial. Aorta: The aortic root and ascending aorta are structurally normal, with no evidence of dilitation. Venous: The inferior vena cava is normal in size with less than 50% respiratory variability, suggesting right atrial pressure of 8 mmHg. IAS/Shunts: The interatrial septum was not well visualized.  LEFT VENTRICLE PLAX 2D LVIDd:         5.70 cm      Diastology LVIDs:         5.30 cm      LV e' medial:    2.93 cm/s LV PW:         0.80 cm      LV E/e' medial:  38.6 LV IVS:        0.70 cm      LV e' lateral:   6.45 cm/s LVOT diam:     1.90 cm      LV E/e' lateral: 17.5 LV SV:         32 LV SV Index:   17 LVOT Area:     2.84 cm  LV Volumes (MOD) LV vol d, MOD A2C: 154.0 ml LV vol d, MOD A4C: 149.0 ml LV vol s, MOD A2C: 110.0 ml LV vol s, MOD A4C: 104.0 ml LV SV MOD A2C:     44.0 ml LV SV MOD A4C:     149.0 ml LV SV MOD BP:      44.3 ml RIGHT VENTRICLE  IVC RV Basal diam:  4.00 cm    IVC diam: 2.00 cm RV Mid diam:    2.40 cm RV S prime:     4.88 cm/s TAPSE (M-mode): 0.8 cm LEFT ATRIUM             Index        RIGHT ATRIUM           Index LA diam:        4.10 cm 2.23 cm/m   RA Area:     16.50 cm LA Vol (A2C):   63.2 ml 34.40 ml/m  RA Volume:   41.90 ml  22.81 ml/m LA Vol (A4C):   55.2 ml 30.05 ml/m LA Biplane Vol: 59.8 ml 32.55 ml/m  AORTIC VALVE                     PULMONIC VALVE AV Area (Vmax):    0.74 cm      PV Vmax:          0.83 m/s AV Area (Vmean):   0.70 cm      PV Peak grad:     2.7 mmHg AV Area (VTI):     0.70 cm      PR End Diast Vel: 7.95 msec AV Vmax:           239.25 cm/s AV  Vmean:          180.250 cm/s AV VTI:            0.459 m AV Peak Grad:      22.9 mmHg AV Mean Grad:      14.0 mmHg LVOT Vmax:         62.80 cm/s LVOT Vmean:        44.300 cm/s LVOT VTI:          0.113 m LVOT/AV VTI ratio: 0.25 AI PHT:            266 msec  AORTA Ao Root diam: 3.00 cm Ao Asc diam:  3.70 cm MITRAL VALVE                  TRICUSPID VALVE MV Area (PHT): 10.25 cm      TR Peak grad:   43.0 mmHg MV Decel Time: 74 msec        TR Vmax:        328.00 cm/s MR Peak grad:    106.9 mmHg MR Mean grad:    70.0 mmHg    SHUNTS MR Vmax:         517.00 cm/s  Systemic VTI:  0.11 m MR Vmean:        396.0 cm/s   Systemic Diam: 1.90 cm MR PISA:         1.01 cm MR PISA Eff ROA: 7 mm MR PISA Radius:  0.40 cm MV E velocity: 113.00 cm/s MV A velocity: 47.70 cm/s MV E/A ratio:  2.37 Mertie Moores MD Electronically signed by Mertie Moores MD Signature Date/Time: 06/25/2021/1:24:31 PM    Final    US Abdomen Complete  Result Date: 06/24/2021 CLINICAL DATA:  Elevated LFTs. EXAM: ABDOMEN ULTRASOUND COMPLETE COMPARISON:  PET-CT dated May 27, 2021. FINDINGS: Gallbladder: Small gallstones and sludge. No wall thickening visualized. No sonographic Murphy sign noted by sonographer. Common bile duct: Diameter: 3 mm, normal. Liver: No focal lesion identified. Within normal limits in parenchymal echogenicity. Portal vein is patent on color Doppler imaging with normal direction of blood flow towards the  liver. IVC: No abnormality visualized. Pancreas: Visualized portion unremarkable. Spleen: Size and appearance within normal limits. Right Kidney: Length: 10.7 cm. Echogenicity within normal limits. No mass or hydronephrosis visualized. Left Kidney: Length: 9.8 cm. Echogenicity within normal limits. No mass or hydronephrosis visualized. Abdominal aorta: No aneurysm visualized. Other findings: Left pleural effusion. IMPRESSION: 1. No acute abnormality. 2. Cholelithiasis and sludge. 3. Left pleural effusion. Electronically Signed   By: Titus Dubin M.D.   On: 06/24/2021 18:58   DG Chest 2 View  Result Date: 06/24/2021 CLINICAL DATA:  Lung cancer, neutropenia, worsening cough EXAM: CHEST - 2 VIEW COMPARISON:  05/04/2021 FINDINGS: Cardiomegaly status post median sternotomy and CABG. Diffuse bilateral interstitial pulmonary opacity, increased compared to prior examination. Small right pleural effusion, increased compared to prior. IMPRESSION: 1. Cardiomegaly with diffuse bilateral interstitial pulmonary opacity, consistent with edema or infection. 2. Small right pleural effusion, increased compared to prior examination. Electronically Signed   By: Delanna Ahmadi M.D.   On: 06/24/2021 15:54    ASSESSMENT AND PLAN: This is a very pleasant 71 years old white male with recently diagnosed stage IIIB (T4, N0/N2, M0) non-small cell lung cancer, adenocarcinoma presented with large left lower lobe lung mass and suspicious mediastinal lymphadenopathy diagnosed in May 2023. The patient has large volume of disease in that area and per discussion with radiation oncology it was advised to consider him for an induction chemotherapy for 2-3 cycles with the hope to decrease the volume of the disease to be irradiated in the near future. The patient is currently undergoing neoadjuvant systemic chemotherapy with carboplatin for AUC of 5 and Alimta 500 Mg/M2 status post 1 cycle. The patient tolerated the first cycle of his treatment fairly well except for chemotherapy-induced neutropenia in addition to liver dysfunction that significantly improved.  He was also recently treated for congestive heart failure.  His blood sugar is uncontrolled today. I recommended for the patient to proceed with cycle #2 today as planned. For the hyperglycemia, he is currently on Ozempic and metformin.  I will give the patient 10 units of regular insulin today and he was advised to follow-up with his primary care physician for any more adjustment of his medication. The patient will  come back for follow-up visit in 3 weeks for evaluation before the next cycle of his treatment. He was advised to call immediately if he has any other concerning symptoms in the interval. The patient voices understanding of current disease status and treatment options and is in agreement with the current care plan.  All questions were answered. The patient knows to call the clinic with any problems, questions or concerns. We can certainly see the patient much sooner if necessary.  The total time spent in the appointment was 30 minutes.  Disclaimer: This note was dictated with voice recognition software. Similar sounding words can inadvertently be transcribed and may not be corrected upon review.

## 2021-07-01 NOTE — Progress Notes (Signed)
Per Dr. Julien Nordmann ok for treatment today with ALT 164 U/L and elevated heart rate. Pt. denies complaints of chest pain, dizziness, no shortness of breath noted. Pt. to receive Insulin for glucose of 392.

## 2021-07-02 DIAGNOSIS — I10 Essential (primary) hypertension: Secondary | ICD-10-CM | POA: Diagnosis not present

## 2021-07-02 DIAGNOSIS — I5040 Unspecified combined systolic (congestive) and diastolic (congestive) heart failure: Secondary | ICD-10-CM | POA: Diagnosis not present

## 2021-07-02 DIAGNOSIS — E1151 Type 2 diabetes mellitus with diabetic peripheral angiopathy without gangrene: Secondary | ICD-10-CM | POA: Diagnosis not present

## 2021-07-02 DIAGNOSIS — I25118 Atherosclerotic heart disease of native coronary artery with other forms of angina pectoris: Secondary | ICD-10-CM | POA: Diagnosis not present

## 2021-07-02 DIAGNOSIS — E782 Mixed hyperlipidemia: Secondary | ICD-10-CM | POA: Diagnosis not present

## 2021-07-03 ENCOUNTER — Other Ambulatory Visit: Payer: Self-pay | Admitting: Internal Medicine

## 2021-07-05 ENCOUNTER — Encounter: Payer: Self-pay | Admitting: Physician Assistant

## 2021-07-05 ENCOUNTER — Encounter: Payer: Self-pay | Admitting: Internal Medicine

## 2021-07-07 DIAGNOSIS — F418 Other specified anxiety disorders: Secondary | ICD-10-CM | POA: Diagnosis not present

## 2021-07-07 DIAGNOSIS — C3492 Malignant neoplasm of unspecified part of left bronchus or lung: Secondary | ICD-10-CM | POA: Diagnosis not present

## 2021-07-07 DIAGNOSIS — J449 Chronic obstructive pulmonary disease, unspecified: Secondary | ICD-10-CM | POA: Diagnosis not present

## 2021-07-07 DIAGNOSIS — R531 Weakness: Secondary | ICD-10-CM | POA: Diagnosis not present

## 2021-07-07 DIAGNOSIS — I5042 Chronic combined systolic (congestive) and diastolic (congestive) heart failure: Secondary | ICD-10-CM | POA: Diagnosis not present

## 2021-07-07 DIAGNOSIS — E1151 Type 2 diabetes mellitus with diabetic peripheral angiopathy without gangrene: Secondary | ICD-10-CM | POA: Diagnosis not present

## 2021-07-08 ENCOUNTER — Inpatient Hospital Stay: Payer: Medicare PPO | Attending: Internal Medicine

## 2021-07-08 ENCOUNTER — Other Ambulatory Visit: Payer: Self-pay

## 2021-07-08 DIAGNOSIS — E0965 Drug or chemical induced diabetes mellitus with hyperglycemia: Secondary | ICD-10-CM | POA: Insufficient documentation

## 2021-07-08 DIAGNOSIS — C3432 Malignant neoplasm of lower lobe, left bronchus or lung: Secondary | ICD-10-CM | POA: Insufficient documentation

## 2021-07-08 DIAGNOSIS — T451X5A Adverse effect of antineoplastic and immunosuppressive drugs, initial encounter: Secondary | ICD-10-CM | POA: Insufficient documentation

## 2021-07-08 DIAGNOSIS — D61810 Antineoplastic chemotherapy induced pancytopenia: Secondary | ICD-10-CM | POA: Insufficient documentation

## 2021-07-08 DIAGNOSIS — D61818 Other pancytopenia: Secondary | ICD-10-CM

## 2021-07-08 DIAGNOSIS — Z5111 Encounter for antineoplastic chemotherapy: Secondary | ICD-10-CM | POA: Insufficient documentation

## 2021-07-08 DIAGNOSIS — Z79899 Other long term (current) drug therapy: Secondary | ICD-10-CM | POA: Insufficient documentation

## 2021-07-08 DIAGNOSIS — C3492 Malignant neoplasm of unspecified part of left bronchus or lung: Secondary | ICD-10-CM

## 2021-07-08 LAB — CMP (CANCER CENTER ONLY)
ALT: 105 U/L — ABNORMAL HIGH (ref 0–44)
AST: 35 U/L (ref 15–41)
Albumin: 3.6 g/dL (ref 3.5–5.0)
Alkaline Phosphatase: 103 U/L (ref 38–126)
Anion gap: 10 (ref 5–15)
BUN: 43 mg/dL — ABNORMAL HIGH (ref 8–23)
CO2: 22 mmol/L (ref 22–32)
Calcium: 9.4 mg/dL (ref 8.9–10.3)
Chloride: 100 mmol/L (ref 98–111)
Creatinine: 1.1 mg/dL (ref 0.61–1.24)
GFR, Estimated: >60 mL/min (ref >60)
Glucose, Bld: 260 mg/dL — ABNORMAL HIGH (ref 70–99)
Potassium: 5.2 mmol/L — ABNORMAL HIGH (ref 3.5–5.1)
Sodium: 132 mmol/L — ABNORMAL LOW (ref 135–145)
Total Bilirubin: 2.2 mg/dL — ABNORMAL HIGH (ref 0.3–1.2)
Total Protein: 6.8 g/dL (ref 6.5–8.1)

## 2021-07-08 LAB — CBC WITH DIFFERENTIAL (CANCER CENTER ONLY)
Abs Immature Granulocytes: 0.05 10*3/uL (ref 0.00–0.07)
Basophils Absolute: 0 10*3/uL (ref 0.0–0.1)
Basophils Relative: 0 %
Eosinophils Absolute: 0 10*3/uL (ref 0.0–0.5)
Eosinophils Relative: 0 %
HCT: 28.5 % — ABNORMAL LOW (ref 39.0–52.0)
Hemoglobin: 9.8 g/dL — ABNORMAL LOW (ref 13.0–17.0)
Immature Granulocytes: 1 %
Lymphocytes Relative: 26 %
Lymphs Abs: 1.1 10*3/uL (ref 0.7–4.0)
MCH: 31.7 pg (ref 26.0–34.0)
MCHC: 34.4 g/dL (ref 30.0–36.0)
MCV: 92.2 fL (ref 80.0–100.0)
Monocytes Absolute: 0 10*3/uL — ABNORMAL LOW (ref 0.1–1.0)
Monocytes Relative: 0 %
Neutro Abs: 2.9 10*3/uL (ref 1.7–7.7)
Neutrophils Relative %: 73 %
Platelet Count: 144 10*3/uL — ABNORMAL LOW (ref 150–400)
RBC: 3.09 MIL/uL — ABNORMAL LOW (ref 4.22–5.81)
RDW: 16.1 % — ABNORMAL HIGH (ref 11.5–15.5)
WBC Count: 4 10*3/uL (ref 4.0–10.5)
nRBC: 0 % (ref 0.0–0.2)

## 2021-07-08 LAB — SAMPLE TO BLOOD BANK

## 2021-07-15 ENCOUNTER — Telehealth: Payer: Self-pay | Admitting: Physician Assistant

## 2021-07-15 ENCOUNTER — Other Ambulatory Visit: Payer: Self-pay

## 2021-07-15 ENCOUNTER — Inpatient Hospital Stay: Payer: Medicare PPO

## 2021-07-15 ENCOUNTER — Telehealth: Payer: Self-pay

## 2021-07-15 DIAGNOSIS — T451X5A Adverse effect of antineoplastic and immunosuppressive drugs, initial encounter: Secondary | ICD-10-CM | POA: Diagnosis not present

## 2021-07-15 DIAGNOSIS — Z79899 Other long term (current) drug therapy: Secondary | ICD-10-CM | POA: Diagnosis not present

## 2021-07-15 DIAGNOSIS — D61818 Other pancytopenia: Secondary | ICD-10-CM

## 2021-07-15 DIAGNOSIS — D61810 Antineoplastic chemotherapy induced pancytopenia: Secondary | ICD-10-CM | POA: Diagnosis not present

## 2021-07-15 DIAGNOSIS — C3432 Malignant neoplasm of lower lobe, left bronchus or lung: Secondary | ICD-10-CM | POA: Diagnosis not present

## 2021-07-15 DIAGNOSIS — C3492 Malignant neoplasm of unspecified part of left bronchus or lung: Secondary | ICD-10-CM

## 2021-07-15 DIAGNOSIS — Z5111 Encounter for antineoplastic chemotherapy: Secondary | ICD-10-CM | POA: Diagnosis not present

## 2021-07-15 DIAGNOSIS — E0965 Drug or chemical induced diabetes mellitus with hyperglycemia: Secondary | ICD-10-CM | POA: Diagnosis not present

## 2021-07-15 LAB — CMP (CANCER CENTER ONLY)
ALT: 74 U/L — ABNORMAL HIGH (ref 0–44)
AST: 37 U/L (ref 15–41)
Albumin: 3.2 g/dL — ABNORMAL LOW (ref 3.5–5.0)
Alkaline Phosphatase: 108 U/L (ref 38–126)
Anion gap: 11 (ref 5–15)
BUN: 45 mg/dL — ABNORMAL HIGH (ref 8–23)
CO2: 22 mmol/L (ref 22–32)
Calcium: 10 mg/dL (ref 8.9–10.3)
Chloride: 98 mmol/L (ref 98–111)
Creatinine: 1.02 mg/dL (ref 0.61–1.24)
GFR, Estimated: >60 mL/min (ref >60)
Glucose, Bld: 292 mg/dL — ABNORMAL HIGH (ref 70–99)
Potassium: 3.7 mmol/L (ref 3.5–5.1)
Sodium: 131 mmol/L — ABNORMAL LOW (ref 135–145)
Total Bilirubin: 1.6 mg/dL — ABNORMAL HIGH (ref 0.3–1.2)
Total Protein: 6.3 g/dL — ABNORMAL LOW (ref 6.5–8.1)

## 2021-07-15 LAB — CBC WITH DIFFERENTIAL (CANCER CENTER ONLY)
Abs Immature Granulocytes: 0.01 10*3/uL (ref 0.00–0.07)
Basophils Absolute: 0 10*3/uL (ref 0.0–0.1)
Basophils Relative: 0 %
Eosinophils Absolute: 0 10*3/uL (ref 0.0–0.5)
Eosinophils Relative: 0 %
HCT: 23.5 % — ABNORMAL LOW (ref 39.0–52.0)
Hemoglobin: 8.4 g/dL — ABNORMAL LOW (ref 13.0–17.0)
Immature Granulocytes: 1 %
Lymphocytes Relative: 38 %
Lymphs Abs: 0.3 10*3/uL — ABNORMAL LOW (ref 0.7–4.0)
MCH: 31.8 pg (ref 26.0–34.0)
MCHC: 35.7 g/dL (ref 30.0–36.0)
MCV: 89 fL (ref 80.0–100.0)
Monocytes Absolute: 0.2 10*3/uL (ref 0.1–1.0)
Monocytes Relative: 23 %
Neutro Abs: 0.3 10*3/uL — CL (ref 1.7–7.7)
Neutrophils Relative %: 38 %
Platelet Count: 10 10*3/uL — ABNORMAL LOW (ref 150–400)
RBC: 2.64 MIL/uL — ABNORMAL LOW (ref 4.22–5.81)
RDW: 15.2 % (ref 11.5–15.5)
WBC Count: 0.8 10*3/uL — CL (ref 4.0–10.5)
nRBC: 7.1 % — ABNORMAL HIGH (ref 0.0–0.2)

## 2021-07-15 LAB — SAMPLE TO BLOOD BANK

## 2021-07-15 NOTE — Telephone Encounter (Signed)
Called pt and left 2 messages to advise that WBC, ANC plt and HGB are critically low. Requested that he call office back immediately  so that he can be scheduled for an injections x3 and platelets. Advised to hold Plavix and aspirin until counts are back up. Also advised regarding bleeding precautions. Attempted to call pt's wife but her VM is full. Sent Mychart message as well with this information.

## 2021-07-15 NOTE — Telephone Encounter (Signed)
CRITICAL VALUE STICKER  CRITICAL VALUE:  WBC  0.8 K/uL HGB 8.4 g/dL ANC 0.3 K/uL  RECEIVER (on-site recipient of call):  Arna Snipe RN  DATE & TIME NOTIFIED: 07/15/21  1:30 PM  MESSENGER (representative from lab): Pam  MD NOTIFIED: Cassandra H. PA  TIME OF NOTIFICATION: 1:51 PM

## 2021-07-15 NOTE — Telephone Encounter (Signed)
We have been trying to get in touch with the patient to review his lab work from today. Numerous attempts were made. Mychart message sent. His wife's home phone number is not in service. His wife's mailbox is full. I called the patient and left a detailed voicemail. Discussed we would like to arrange for granix injections daily today, tomorrow, and Saturday due to his low neutrophil count. We also wanted to assess to ensure no signs of infection. Secondly, his platelet count is low. I would like to arrange for platelet transfusion with 1 unit. Additionally, he should hold his aspirin and plavix for at least 5 days until improvement and re-evaluation of his labs. Should he develop any signs or symptoms of infection or significant bleeding after hours, would recommend evaluation in the ER. Pending call back to discuss this further and arrange for appointments and place orders for transfusion ASAP.

## 2021-07-16 ENCOUNTER — Telehealth: Payer: Self-pay | Admitting: Medical Oncology

## 2021-07-16 NOTE — Telephone Encounter (Signed)
No answer

## 2021-07-19 ENCOUNTER — Telehealth: Payer: Self-pay | Admitting: Pulmonary Disease

## 2021-07-19 MED ORDER — TRELEGY ELLIPTA 100-62.5-25 MCG/ACT IN AEPB: 1.0000 | INHALATION_SPRAY | Freq: Every day | RESPIRATORY_TRACT | 5 refills | Status: AC

## 2021-07-19 NOTE — Telephone Encounter (Signed)
Rx for Trelegy has been sent to preferred pharmacy for pt. Called and spoke with pt's spouse letting her know this had been done and she verbalized understanding. Nothing further needed.

## 2021-07-20 ENCOUNTER — Telehealth: Payer: Self-pay

## 2021-07-20 ENCOUNTER — Inpatient Hospital Stay (HOSPITAL_BASED_OUTPATIENT_CLINIC_OR_DEPARTMENT_OTHER): Payer: Medicare PPO

## 2021-07-20 ENCOUNTER — Other Ambulatory Visit: Payer: Self-pay

## 2021-07-20 DIAGNOSIS — D61810 Antineoplastic chemotherapy induced pancytopenia: Secondary | ICD-10-CM | POA: Diagnosis not present

## 2021-07-20 DIAGNOSIS — C3492 Malignant neoplasm of unspecified part of left bronchus or lung: Secondary | ICD-10-CM

## 2021-07-20 DIAGNOSIS — T451X5A Adverse effect of antineoplastic and immunosuppressive drugs, initial encounter: Secondary | ICD-10-CM | POA: Diagnosis not present

## 2021-07-20 DIAGNOSIS — D61818 Other pancytopenia: Secondary | ICD-10-CM

## 2021-07-20 DIAGNOSIS — E0965 Drug or chemical induced diabetes mellitus with hyperglycemia: Secondary | ICD-10-CM | POA: Diagnosis not present

## 2021-07-20 DIAGNOSIS — C3432 Malignant neoplasm of lower lobe, left bronchus or lung: Secondary | ICD-10-CM | POA: Diagnosis not present

## 2021-07-20 DIAGNOSIS — Z79899 Other long term (current) drug therapy: Secondary | ICD-10-CM | POA: Diagnosis not present

## 2021-07-20 DIAGNOSIS — Z5111 Encounter for antineoplastic chemotherapy: Secondary | ICD-10-CM | POA: Diagnosis not present

## 2021-07-20 LAB — CMP (CANCER CENTER ONLY)
ALT: 85 U/L — ABNORMAL HIGH (ref 0–44)
AST: 44 U/L — ABNORMAL HIGH (ref 15–41)
Albumin: 3.1 g/dL — ABNORMAL LOW (ref 3.5–5.0)
Alkaline Phosphatase: 105 U/L (ref 38–126)
Anion gap: 8 (ref 5–15)
BUN: 55 mg/dL — ABNORMAL HIGH (ref 8–23)
CO2: 24 mmol/L (ref 22–32)
Calcium: 9.1 mg/dL (ref 8.9–10.3)
Chloride: 98 mmol/L (ref 98–111)
Creatinine: 1.3 mg/dL — ABNORMAL HIGH (ref 0.61–1.24)
GFR, Estimated: 58 mL/min — ABNORMAL LOW (ref >60)
Glucose, Bld: 304 mg/dL — ABNORMAL HIGH (ref 70–99)
Potassium: 3.8 mmol/L (ref 3.5–5.1)
Sodium: 130 mmol/L — ABNORMAL LOW (ref 135–145)
Total Bilirubin: 1.6 mg/dL — ABNORMAL HIGH (ref 0.3–1.2)
Total Protein: 6.1 g/dL — ABNORMAL LOW (ref 6.5–8.1)

## 2021-07-20 LAB — CBC WITH DIFFERENTIAL (CANCER CENTER ONLY)
Abs Immature Granulocytes: 0.14 10*3/uL — ABNORMAL HIGH (ref 0.00–0.07)
Basophils Absolute: 0 10*3/uL (ref 0.0–0.1)
Basophils Relative: 0 %
Eosinophils Absolute: 0 10*3/uL (ref 0.0–0.5)
Eosinophils Relative: 0 %
HCT: 28.1 % — ABNORMAL LOW (ref 39.0–52.0)
Hemoglobin: 9.9 g/dL — ABNORMAL LOW (ref 13.0–17.0)
Immature Granulocytes: 2 %
Lymphocytes Relative: 10 %
Lymphs Abs: 0.6 10*3/uL — ABNORMAL LOW (ref 0.7–4.0)
MCH: 32.4 pg (ref 26.0–34.0)
MCHC: 35.2 g/dL (ref 30.0–36.0)
MCV: 91.8 fL (ref 80.0–100.0)
Monocytes Absolute: 0.5 10*3/uL (ref 0.1–1.0)
Monocytes Relative: 9 %
Neutro Abs: 4.9 10*3/uL (ref 1.7–7.7)
Neutrophils Relative %: 79 %
Platelet Count: 116 10*3/uL — ABNORMAL LOW (ref 150–400)
RBC: 3.06 MIL/uL — ABNORMAL LOW (ref 4.22–5.81)
RDW: 19.1 % — ABNORMAL HIGH (ref 11.5–15.5)
WBC Count: 6.2 10*3/uL (ref 4.0–10.5)
nRBC: 2.6 % — ABNORMAL HIGH (ref 0.0–0.2)

## 2021-07-20 LAB — SAMPLE TO BLOOD BANK

## 2021-07-20 NOTE — Telephone Encounter (Signed)
Spoke with pt in lobby to provide copy of today's labs. Per Cassie PA, patient will still need to come for lab and MD apt on 07/22/21.

## 2021-07-21 MED FILL — Fosaprepitant Dimeglumine For IV Infusion 150 MG (Base Eq): INTRAVENOUS | Qty: 5 | Status: AC

## 2021-07-21 MED FILL — Dexamethasone Sodium Phosphate Inj 100 MG/10ML: INTRAMUSCULAR | Qty: 1 | Status: AC

## 2021-07-22 ENCOUNTER — Other Ambulatory Visit: Payer: Self-pay

## 2021-07-22 ENCOUNTER — Inpatient Hospital Stay: Payer: Medicare PPO

## 2021-07-22 ENCOUNTER — Inpatient Hospital Stay: Payer: Medicare PPO | Admitting: Internal Medicine

## 2021-07-22 VITALS — BP 101/69 | HR 81 | Temp 97.6°F | Resp 15 | Wt 145.9 lb

## 2021-07-22 VITALS — BP 81/56 | HR 70 | Temp 97.8°F | Resp 16

## 2021-07-22 DIAGNOSIS — Z79899 Other long term (current) drug therapy: Secondary | ICD-10-CM | POA: Diagnosis not present

## 2021-07-22 DIAGNOSIS — C3490 Malignant neoplasm of unspecified part of unspecified bronchus or lung: Secondary | ICD-10-CM | POA: Diagnosis not present

## 2021-07-22 DIAGNOSIS — E0965 Drug or chemical induced diabetes mellitus with hyperglycemia: Secondary | ICD-10-CM | POA: Diagnosis not present

## 2021-07-22 DIAGNOSIS — C3492 Malignant neoplasm of unspecified part of left bronchus or lung: Secondary | ICD-10-CM

## 2021-07-22 DIAGNOSIS — D61818 Other pancytopenia: Secondary | ICD-10-CM

## 2021-07-22 DIAGNOSIS — C3432 Malignant neoplasm of lower lobe, left bronchus or lung: Secondary | ICD-10-CM | POA: Diagnosis not present

## 2021-07-22 DIAGNOSIS — D61810 Antineoplastic chemotherapy induced pancytopenia: Secondary | ICD-10-CM | POA: Diagnosis not present

## 2021-07-22 DIAGNOSIS — Z5111 Encounter for antineoplastic chemotherapy: Secondary | ICD-10-CM | POA: Diagnosis not present

## 2021-07-22 DIAGNOSIS — T451X5A Adverse effect of antineoplastic and immunosuppressive drugs, initial encounter: Secondary | ICD-10-CM | POA: Diagnosis not present

## 2021-07-22 LAB — CBC WITH DIFFERENTIAL (CANCER CENTER ONLY)
Abs Immature Granulocytes: 0.06 10*3/uL (ref 0.00–0.07)
Basophils Absolute: 0 10*3/uL (ref 0.0–0.1)
Basophils Relative: 0 %
Eosinophils Absolute: 0 10*3/uL (ref 0.0–0.5)
Eosinophils Relative: 0 %
HCT: 26.2 % — ABNORMAL LOW (ref 39.0–52.0)
Hemoglobin: 9.1 g/dL — ABNORMAL LOW (ref 13.0–17.0)
Immature Granulocytes: 1 %
Lymphocytes Relative: 9 %
Lymphs Abs: 0.5 10*3/uL — ABNORMAL LOW (ref 0.7–4.0)
MCH: 32 pg (ref 26.0–34.0)
MCHC: 34.7 g/dL (ref 30.0–36.0)
MCV: 92.3 fL (ref 80.0–100.0)
Monocytes Absolute: 0.3 10*3/uL (ref 0.1–1.0)
Monocytes Relative: 5 %
Neutro Abs: 4.5 10*3/uL (ref 1.7–7.7)
Neutrophils Relative %: 85 %
Platelet Count: 177 10*3/uL (ref 150–400)
RBC: 2.84 MIL/uL — ABNORMAL LOW (ref 4.22–5.81)
RDW: 20.2 % — ABNORMAL HIGH (ref 11.5–15.5)
WBC Count: 5.3 10*3/uL (ref 4.0–10.5)
nRBC: 1.1 % — ABNORMAL HIGH (ref 0.0–0.2)

## 2021-07-22 LAB — CMP (CANCER CENTER ONLY)
ALT: 70 U/L — ABNORMAL HIGH (ref 0–44)
AST: 34 U/L (ref 15–41)
Albumin: 3.1 g/dL — ABNORMAL LOW (ref 3.5–5.0)
Alkaline Phosphatase: 101 U/L (ref 38–126)
Anion gap: 10 (ref 5–15)
BUN: 47 mg/dL — ABNORMAL HIGH (ref 8–23)
CO2: 20 mmol/L — ABNORMAL LOW (ref 22–32)
Calcium: 8.7 mg/dL — ABNORMAL LOW (ref 8.9–10.3)
Chloride: 98 mmol/L (ref 98–111)
Creatinine: 0.98 mg/dL (ref 0.61–1.24)
GFR, Estimated: >60 mL/min (ref >60)
Glucose, Bld: 409 mg/dL — ABNORMAL HIGH (ref 70–99)
Potassium: 3.9 mmol/L (ref 3.5–5.1)
Sodium: 128 mmol/L — ABNORMAL LOW (ref 135–145)
Total Bilirubin: 1.3 mg/dL — ABNORMAL HIGH (ref 0.3–1.2)
Total Protein: 6.1 g/dL — ABNORMAL LOW (ref 6.5–8.1)

## 2021-07-22 LAB — SAMPLE TO BLOOD BANK

## 2021-07-22 MED ORDER — INSULIN ASPART 100 UNIT/ML IJ SOLN
10.0000 [IU] | Freq: Once | INTRAMUSCULAR | Status: AC
  Administered 2021-07-22: 10 [IU] via SUBCUTANEOUS
  Filled 2021-07-22: qty 1

## 2021-07-22 MED ORDER — SODIUM CHLORIDE 0.9 % IV SOLN
10.0000 mg | Freq: Once | INTRAVENOUS | Status: AC
  Administered 2021-07-22: 10 mg via INTRAVENOUS
  Filled 2021-07-22: qty 10

## 2021-07-22 MED ORDER — SODIUM CHLORIDE 0.9 % IV SOLN: Freq: Once | INTRAVENOUS | Status: AC

## 2021-07-22 MED ORDER — SODIUM CHLORIDE 0.9 % IV SOLN
150.0000 mg | Freq: Once | INTRAVENOUS | Status: AC
  Administered 2021-07-22: 150 mg via INTRAVENOUS
  Filled 2021-07-22: qty 150

## 2021-07-22 MED ORDER — CYANOCOBALAMIN 1000 MCG/ML IJ SOLN
1000.0000 ug | Freq: Once | INTRAMUSCULAR | Status: AC
  Administered 2021-07-22: 1000 ug via INTRAMUSCULAR
  Filled 2021-07-22: qty 1

## 2021-07-22 MED ORDER — SODIUM CHLORIDE 0.9 % IV SOLN
400.0000 mg/m2 | Freq: Once | INTRAVENOUS | Status: AC
  Administered 2021-07-22: 700 mg via INTRAVENOUS
  Filled 2021-07-22: qty 20

## 2021-07-22 MED ORDER — SODIUM CHLORIDE 0.9 % IV SOLN
360.0000 mg | Freq: Once | INTRAVENOUS | Status: AC
  Administered 2021-07-22: 360 mg via INTRAVENOUS
  Filled 2021-07-22: qty 36

## 2021-07-22 MED ORDER — PALONOSETRON HCL INJECTION 0.25 MG/5ML
0.2500 mg | Freq: Once | INTRAVENOUS | Status: AC
  Administered 2021-07-22: 0.25 mg via INTRAVENOUS
  Filled 2021-07-22: qty 5

## 2021-07-22 MED ORDER — DEXAMETHASONE 4 MG PO TABS: ORAL_TABLET | ORAL | 0 refills | Status: AC

## 2021-07-22 NOTE — Progress Notes (Signed)
[place] [phone]) [phone]   Fax:([phone]) [phone]  OFFICE PROGRESS NOTE  [name] Orn, MD [address] [place] [address]  DIAGNOSIS:  Stage IIIb (T4, N2, M0) non-small cell lung cancer, adenocarcinoma presented with large left lower lobe lung mass in addition to subcarinal lymphadenopathy diagnosed in May 2023. His molecular studies by [email or web address] showed no actionable mutations and PD-L1 expression is 2%.  PRIOR THERAPY: None  CURRENT THERAPY: Induction systemic chemotherapy with carboplatin for AUC of 5 and Alimta 500 Mg/M2 every 3 weeks for 3 cycles.  First cycle expected on 06/10/2021.  Status post 2 cycles.   INTERVAL HISTORY: [name] [name] 71 y.o. male returns to the clinic today for follow-up visit accompanied by his wife.  The patient is feeling very well today with no concerning complaints except for fatigue.  He has chemotherapy-induced pancytopenia last week with platelets count was down to 10,000.  It was very hard to reach out to the patient for recommendation because his phone was not working.  He recovered very well and his platelets count are back to the normal range.  He continues to have significant hyperglycemia especially when he takes the steroid premedication.  His blood sugar today is 409.  He is currently on Ozempic.  He denied having any current chest pain, shortness of breath, cough or hemoptysis.  He has no nausea, vomiting, diarrhea or constipation.  He has no headache or visual changes.  He is here today for evaluation before starting cycle #3 of his treatment.  MEDICAL HISTORY: Past Medical History:  Diagnosis Date   Anxiety    Arthritis    Chest pain    a. experience weekly, nitrate responsive c/p x several years.   b. ranexa added on 08/2014 admission    Chronic combined systolic and diastolic CHF (congestive heart failure) ([place])    Claudication ([place])    a. 08/2014 ABI: R 0.79, L 1.0-->Walking program instituted.    Coronary artery disease    a. s/p MI in 1989;  b. 04/2008 CABG x 5 (LIMA->LAD, VG->D1, VG->RI, VG->PDA, L Radial->OM2);  c. 06/2010 PCI/DES to the Diag (2.25x28 Promus Element DES). VG->Diag occluded;  d. 08/2010 Attempted PTCA of the OM2 via the free radial graft. Unsuccessful.  Lesion felt to be 2/2 suture.  c. NSTEMI s/p [facility] with severe native and graph dz; D1 that was stented in 2012 now occulded: Rx therapy recommended   Coronary artery disease involving native coronary artery of native heart with angina pectoris ([place])    a. s/p MI in 1989;  b. 04/2008 CABG x 5 (LIMA->LAD, VG->D1, VG->RI, VG->PDA, L Radial->OM2);  c. 06/2010 PCI/DES to the Diag (2.25x28 Promus Element DES). VG->Diag occluded;  d. 08/2010 Attempted PTCA of the OM2 via the free radial graft. Unsuccessful.  Lesion felt to be 2/2 suture.  c. NSTEMI s/p [facility] with severe native and graph dz; Rx therapy recommended    COVID 2021   mild case   Diabetes mellitus ([place]) 06/02/2011   type 2   Hyperlipidemia    Hypertension    NSTEMI (non-ST elevated myocardial infarction) ([place]) 08/29/2014   Peripheral arterial disease ([place]) 08/16/2014   Pneumonia    Type II diabetes mellitus ([place])     ALLERGIES:  is allergic to aldactone [spironolactone], amlodipine, farxiga [dapagliflozin], percocet [oxycodone-acetaminophen], trulicity [dulaglutide], avandia [rosiglitazone], and codeine.  MEDICATIONS:  Current Outpatient Medications  Medication Sig Dispense Refill   ACCU-CHEK GUIDE test strip  Accu-Chek Softclix Lancets lancets      acetaminophen (TYLENOL) 500 MG tablet Take 1 tablet (500 mg total) by mouth every 8 (eight) hours as needed for mild pain. Please do not take tylenol if your liver function is abnormal 30 tablet 0   ALPRAZolam (XANAX) 0.25 MG tablet Take 0.25 mg by mouth at bedtime as needed for anxiety (restless legs).     aspirin 81 MG tablet Take 1 tablet (81 mg total) by mouth daily. Hold if you have bleeding or low platelet 30 tablet     atorvastatin (LIPITOR) 80 MG tablet Take 1 tablet (80 mg total) by mouth daily. Hold this medication, can resume when your liver function become normal 90 tablet 3   Azelastine HCl 0.15 % SOLN Place 1 spray into both nostrils daily as needed (Allergies).     Blood Glucose Monitoring Suppl (ACCU-CHEK GUIDE) w/Device KIT      carvedilol (COREG) 6.25 MG tablet Take 1 tablet (6.25 mg total) by mouth 2 (two) times daily with a meal. Please take half a tab twice a day for the next few days, please check your blood pressure at home, bring record for your pcp to review, your pcp to decide on when you can go back on one full tab twice a day 180 tablet 3   cholecalciferol (VITAMIN D) 1000 UNITS tablet Take 1,000 Units by mouth daily.      clopidogrel (PLAVIX) 75 MG tablet Take 75 mg by mouth daily with breakfast.     cromolyn (OPTICROM) 4 % ophthalmic solution Place 1 drop into both eyes daily.     dexamethasone (DECADRON) 4 MG tablet 4 mg p.o. twice daily the day before, day of and day after the chemotherapy every 3 weeks (Patient taking differently: Take 4 mg by mouth See admin instructions. Take 4 mg by mouth two times a day the day before, day of, and day after chemo, every three weeks) 20 tablet 0   ezetimibe (ZETIA) 10 MG tablet TAKE 1 TABLET EVERY DAY (Patient taking differently: Take 10 mg by mouth daily.) 90 tablet 3   fluticasone (FLONASE) 50 MCG/ACT nasal spray Place 2 sprays into the nose See admin instructions. Instill 2 sprays into each nostril one to two times a day     Fluticasone-Umeclidin-Vilant (TRELEGY ELLIPTA) 100-62.5-25 MCG/ACT AEPB Inhale 1 puff into the lungs daily. 60 each 5   folic acid (FOLVITE) 1 MG tablet Take 1 tablet (1 mg total) by mouth daily. 30 tablet 4   furosemide (LASIX) 40 MG tablet Take 1 tablet (40 mg total) by mouth daily. Please continue to weight yourself daily, and check your feet/ankle/legs Please take extra dose of lasix if your weight go up and your legs are  swollen, please call your doctor if your have to take extra dose of lasix due to weight gain and being swollen 90 tablet 3   isosorbide mononitrate (IMDUR) 30 MG 24 hr tablet Take one tablet by mouth once daily along with your Imdur $RemoveBe'60mg'eBIPAjGkc$  tablet for a total of $Remove'90mg'SZybrmh$  90 tablet 3   isosorbide mononitrate (IMDUR) 60 MG 24 hr tablet Take 1 tablet (60 mg total) by mouth daily. Please hold this medication, please check your blood pressure at home, bring record to your pcp next week for further blood pressure meds adjustment 90 tablet 3   loratadine (CLARITIN) 10 MG tablet Take 10 mg by mouth daily.     magic mouthwash SOLN Take 5 mLs by mouth 4 (four) times  daily as needed for mouth pain (for sore mouth). 240 mL 1   metFORMIN (GLUCOPHAGE) 1000 MG tablet Take 1,000 mg by mouth 2 (two) times daily with a meal.     nitroGLYCERIN (NITROSTAT) 0.4 MG SL tablet Place 0.4 mg under the tongue every 5 (five) minutes x 3 doses as needed for chest pain.     Polyvinyl Alcohol-Povidone (CLEAR EYES ALL SEASONS OP) Place 1 drop into both eyes daily as needed (for dry eyes).     prochlorperazine (COMPAZINE) 10 MG tablet TAKE 1 TABLET BY MOUTH EVERY 6 HOURS AS NEEDED FOR NAUSEA OR VOMITING 30 tablet 0   ranolazine (RANEXA) 500 MG 12 hr tablet TAKE 1 TABLET TWICE DAILY (Patient taking differently: Take 500 mg by mouth 2 (two) times daily.) 180 tablet 2   sacubitril-valsartan (ENTRESTO) 97-103 MG Take 1 tablet by mouth 2 (two) times daily. 60 tablet 11   Semaglutide,0.25 or 0.5MG /DOS, (OZEMPIC, 0.25 OR 0.5 MG/DOSE,) 2 MG/1.5ML SOPN Inject 0.25 mg into the skin every Saturday.     No current facility-administered medications for this visit.    SURGICAL HISTORY:  Past Surgical History:  Procedure Laterality Date   BRONCHIAL BIOPSY  05/04/2021   Procedure: BRONCHIAL BIOPSIES;  Surgeon: Garner Nash, DO;  Location: [place];  Service: Pulmonary;;   BRONCHIAL BRUSHINGS  05/04/2021   Procedure: BRONCHIAL BRUSHINGS;   Surgeon: Garner Nash, DO;  Location: [place];  Service: Pulmonary;;   BRONCHIAL WASHINGS  05/04/2021   Procedure: BRONCHIAL WASHINGS;  Surgeon: Garner Nash, DO;  Location: [place];  Service: Pulmonary;;   CARDIAC CATHETERIZATION N/A 08/28/2014   Procedure: Left Heart Cath and Cors/Grafts Angiography;  Surgeon: Belva Crome, MD;  Location: [place];  Service: Cardiovascular;  Laterality: N/A;   COLONOSCOPY     CORONARY ANGIOPLASTY WITH STENT PLACEMENT     CORONARY ARTERY BYPASS GRAFT     FINE NEEDLE ASPIRATION  05/04/2021   Procedure: FINE NEEDLE ASPIRATION (FNA) LINEAR;  Surgeon: Garner Nash, DO;  Location: [place];  Service: Pulmonary;;   LEFT HEART CATH AND CORS/GRAFTS ANGIOGRAPHY N/A 03/20/2018   Procedure: LEFT HEART CATH AND CORS/GRAFTS ANGIOGRAPHY;  Surgeon: Martinique, Peter M, MD;  Location: [place] [facility];  Service: Cardiovascular;  Laterality: N/A;   PERIPHERAL VASCULAR CATHETERIZATION N/A 07/15/2015   Procedure: Abdominal Aortogram w/Lower Extremity;  Surgeon: Wellington Hampshire, MD;  Location: [place] [facility];  Service: Cardiovascular;  Laterality: N/A;   PERIPHERAL VASCULAR CATHETERIZATION Bilateral 07/15/2015   Procedure: Peripheral Vascular Intervention;  Surgeon: Wellington Hampshire, MD;  Location: [place] [facility];  Service: Cardiovascular;  Laterality: Bilateral;  Common [place]li[place]   SHOULDER ARTHROSCOPY WITH ROTATOR CUFF REPAIR AND SUBACROMIAL DECOMPRESSION Left 05/23/2013   Procedure: LEFT SHOULDER ARTHROSCOPY SUBACROMIAL  DECOMPRESSION DISTAL CLAVICLE RESECTION AND ROTATOR CUFF REPAIR ;  Surgeon: Marin Shutter, MD;  Location: [facility];  Service: Orthopedics;  Laterality: Left;   SHOULDER SURGERY Right 7-8 years ago   Dr. Dorna Leitz   VIDEO BRONCHOSCOPY WITH ENDOBRONCHIAL ULTRASOUND Bilateral 05/04/2021   Procedure: VIDEO BRONCHOSCOPY WITH ENDOBRONCHIAL ULTRASOUND;  Surgeon: Garner Nash, DO;  Location: [facility];  Service: Pulmonary;   Laterality: Bilateral;    REVIEW OF SYSTEMS:  Constitutional: positive for fatigue Eyes: negative Ears, nose, mouth, throat, and face: negative Respiratory: negative Cardiovascular: negative Gastrointestinal: negative Genitourinary:negative Integument/breast: negative Hematologic/lymphatic: negative Musculoskeletal:negative Neurological: negative Behavioral/Psych: negative Endocrine: negative Allergic/Immunologic: negative   PHYSICAL EXAMINATION: General appearance: alert, cooperative,  fatigued, and no distress Head: Normocephalic, without obvious abnormality, atraumatic Neck: no adenopathy, no JVD, supple, symmetrical, trachea midline, and thyroid not enlarged, symmetric, no tenderness/mass/nodules Lymph nodes: Cervical, supraclavicular, and axillary nodes normal. Resp: clear to auscultation bilaterally Back: symmetric, no curvature. ROM normal. No CVA tenderness. Cardio: regular rate and rhythm, S1, S2 normal, no murmur, click, rub or gallop GI: soft, non-tender; bowel sounds normal; no masses,  no organomegaly Extremities: extremities normal, atraumatic, no cyanosis or edema Neurologic: Alert and oriented X 3, normal strength and tone. Normal symmetric reflexes. Normal coordination and gait  ECOG PERFORMANCE STATUS: 1 - Symptomatic but completely ambulatory  Blood pressure 101/69, pulse 81, temperature 97.6 F (36.4 C), temperature source Oral, resp. rate 15, weight 145 lb 14.4 oz (66.2 kg), SpO2 100 %.  LABORATORY DATA: Lab Results  Component Value Date   WBC 5.3 07/22/2021   HGB 9.1 (L) 07/22/2021   HCT 26.2 (L) 07/22/2021   MCV 92.3 07/22/2021   PLT 177 07/22/2021      Chemistry      Component Value Date/Time   NA 128 (L) 07/22/2021 0802   NA 127 (L) 03/24/2021 1025   K 3.9 07/22/2021 0802   CL 98 07/22/2021 0802   CO2 20 (L) 07/22/2021 0802   BUN 47 (H) 07/22/2021 0802   BUN 13 03/24/2021 1025   CREATININE 0.98 07/22/2021 0802   CREATININE 0.88 11/13/2015  1131      Component Value Date/Time   CALCIUM 8.7 (L) 07/22/2021 0802   ALKPHOS 101 07/22/2021 0802   AST 34 07/22/2021 0802   ALT 70 (H) 07/22/2021 0802   BILITOT 1.3 (H) 07/22/2021 0802       RADIOGRAPHIC STUDIES: VAS [place] LOWER EXTREMITY VENOUS (DVT)  Result Date: 06/25/2021  Lower Venous DVT Study Patient Name:  [name] [name]  Date of Exam:   06/25/2021 Medical Rec #: [id number]     Accession #:    [id number] Date of Birth: Jan 02, 1951     Patient Gender: M Patient Age:   89 years Exam Location:  [facility] Procedure:      VAS [place] LOWER EXTREMITY VENOUS (DVT) Referring Phys: Annamaria Boots XU --------------------------------------------------------------------------------  Indications: SOB.  Risk Factors: Chemotherapy. Comparison Study: No previous exams Performing Technologist: Jody Hill RVT, RDMS  Examination Guidelines: A complete evaluation includes B-mode imaging, spectral Doppler, color Doppler, and power Doppler as needed of all accessible portions of each vessel. Bilateral testing is considered an integral part of a complete examination. Limited examinations for reoccurring indications may be performed as noted. The reflux portion of the exam is performed with the patient in reverse Trendelenburg.  +---------+---------------+---------+-----------+----------+--------------+ RIGHT    CompressibilityPhasicitySpontaneityPropertiesThrombus Aging +---------+---------------+---------+-----------+----------+--------------+ CFV      Full           No       Yes                                 +---------+---------------+---------+-----------+----------+--------------+ SFJ      Full                                                        +---------+---------------+---------+-----------+----------+--------------+ FV Prox  Full           No  Yes                                 +---------+---------------+---------+-----------+----------+--------------+ FV Mid   Full            No       Yes                                 +---------+---------------+---------+-----------+----------+--------------+ FV DistalFull           No       Yes                                 +---------+---------------+---------+-----------+----------+--------------+ PFV      Full                                                        +---------+---------------+---------+-----------+----------+--------------+ POP      Full           No       Yes                                 +---------+---------------+---------+-----------+----------+--------------+ PTV      Full                                                        +---------+---------------+---------+-----------+----------+--------------+ PERO     Full                                                        +---------+---------------+---------+-----------+----------+--------------+   +---------+---------------+---------+-----------+----------+--------------+ LEFT     CompressibilityPhasicitySpontaneityPropertiesThrombus Aging +---------+---------------+---------+-----------+----------+--------------+ CFV      Full           No       Yes                                 +---------+---------------+---------+-----------+----------+--------------+ SFJ      Full                                                        +---------+---------------+---------+-----------+----------+--------------+ FV Prox  Full           No       Yes                                 +---------+---------------+---------+-----------+----------+--------------+ FV Mid   Full           No       Yes                                 +---------+---------------+---------+-----------+----------+--------------+  FV DistalFull           No       Yes                                 +---------+---------------+---------+-----------+----------+--------------+ PFV      Full                                                         +---------+---------------+---------+-----------+----------+--------------+ POP      Full           No       Yes                                 +---------+---------------+---------+-----------+----------+--------------+ PTV      Full                                                        +---------+---------------+---------+-----------+----------+--------------+ PERO     Full                                                        +---------+---------------+---------+-----------+----------+--------------+     Summary: BILATERAL: - No evidence of deep vein thrombosis seen in the lower extremities, bilaterally. -Pulsatile flow seen bilaterally.  - RIGHT: - No cystic structure found in the popliteal fossa.  LEFT: - A cystic structure is found in the popliteal fossa (4.48 x 1.52 x 2.87 cm).  *See table(s) above for measurements and observations. Electronically signed by Monica Martinez MD on 06/25/2021 at 6:18:31 PM.    Final    ECHOCARDIOGRAM COMPLETE  Result Date: 06/25/2021    ECHOCARDIOGRAM REPORT   Patient Name:   [name] [name] Date of Exam: 06/25/2021 Medical Rec #:  [id number]    Height:       66.0 in Accession #:    [id number]   Weight:       163.8 lb Date of Birth:  1950/04/30    BSA:          1.837 m Patient Age:    85 years     BP:           122/86 mmHg Patient Gender: M            HR:           97 bpm. Exam Location:  Inpatient Procedure: 2D Echo, Color Doppler and Cardiac Doppler Indications:    CHF- Systolic  History:        Patient has prior history of Echocardiogram examinations, most                 recent 12/23/2020. CAD; Prior CABG. Chemo.  Sonographer:    Joette Catching RCS Referring Phys: [id number] [place]  1. Left ventricular ejection fraction, by estimation, is 25 to 30%. The left ventricle has severely decreased function. The left ventricle  demonstrates regional wall motion abnormalities (see scoring diagram/findings for description). The left ventricular  internal cavity size was mildly dilated. Left ventricular diastolic parameters are consistent with Grade III diastolic dysfunction (restrictive). There is severe akinesis of the left ventricular, entire inferior wall.  2. Right ventricular systolic function is severely reduced. The right ventricular size is moderately enlarged.  3. The mitral valve is grossly normal. Moderate mitral valve regurgitation.  4. The aortic valve is calcified. Aortic valve regurgitation is trivial. Mild to moderate aortic valve stenosis.  5. The inferior vena cava is normal in size with <50% respiratory variability, suggesting right atrial pressure of 8 mmHg. FINDINGS  Left Ventricle: Left ventricular ejection fraction, by estimation, is 25 to 30%. The left ventricle has severely decreased function. The left ventricle demonstrates regional wall motion abnormalities. Severe akinesis of the left ventricular, entire inferior wall. The left ventricular internal cavity size was mildly dilated. There is no left ventricular hypertrophy. Left ventricular diastolic parameters are consistent with Grade III diastolic dysfunction (restrictive). Right Ventricle: The right ventricular size is moderately enlarged. Right vetricular wall thickness was not well visualized. Right ventricular systolic function is severely reduced. Left Atrium: Left atrial size was normal in size. Right Atrium: Right atrial size was normal in size. Pericardium: There is no evidence of pericardial effusion. Mitral Valve: The mitral valve is grossly normal. Moderate mitral valve regurgitation. Tricuspid Valve: The tricuspid valve is normal in structure. Tricuspid valve regurgitation is mild. Aortic Valve: The aortic valve is calcified. Aortic valve regurgitation is trivial. Aortic regurgitation PHT measures 266 msec. Mild to moderate aortic stenosis is present. Aortic valve mean gradient measures 14.0 mmHg. Aortic valve peak gradient measures 22.9 mmHg. Aortic valve area, by  VTI measures 0.70 cm. Pulmonic Valve: The pulmonic valve was normal in structure. Pulmonic valve regurgitation is trivial. Aorta: The aortic root and ascending aorta are structurally normal, with no evidence of dilitation. Venous: The inferior vena cava is normal in size with less than 50% respiratory variability, suggesting right atrial pressure of 8 mmHg. IAS/Shunts: The interatrial septum was not well visualized.  LEFT VENTRICLE PLAX 2D LVIDd:         5.70 cm      Diastology LVIDs:         5.30 cm      LV e' medial:    2.93 cm/s LV PW:         0.80 cm      LV E/e' medial:  38.6 LV IVS:        0.70 cm      LV e' lateral:   6.45 cm/s LVOT diam:     1.90 cm      LV E/e' lateral: 17.5 LV SV:         32 LV SV Index:   17 LVOT Area:     2.84 cm  LV Volumes (MOD) LV vol d, MOD A2C: 154.0 ml LV vol d, MOD A4C: 149.0 ml LV vol s, MOD A2C: 110.0 ml LV vol s, MOD A4C: 104.0 ml LV SV MOD A2C:     44.0 ml LV SV MOD A4C:     149.0 ml LV SV MOD BP:      44.3 ml RIGHT VENTRICLE            IVC RV Basal diam:  4.00 cm    IVC diam: 2.00 cm RV Mid diam:    2.40 cm RV S prime:     4.88 cm/s TAPSE (M-mode):  0.8 cm LEFT ATRIUM             Index        RIGHT ATRIUM           Index LA diam:        4.10 cm 2.23 cm/m   RA Area:     16.50 cm LA Vol (A2C):   63.2 ml 34.40 ml/m  RA Volume:   41.90 ml  22.81 ml/m LA Vol (A4C):   55.2 ml 30.05 ml/m LA Biplane Vol: 59.8 ml 32.55 ml/m  AORTIC VALVE                     PULMONIC VALVE AV Area (Vmax):    0.74 cm      PV Vmax:          0.83 m/s AV Area (Vmean):   0.70 cm      PV Peak grad:     2.7 mmHg AV Area (VTI):     0.70 cm      PR End Diast Vel: 7.95 msec AV Vmax:           239.25 cm/s AV Vmean:          180.250 cm/s AV VTI:            0.459 m AV Peak Grad:      22.9 mmHg AV Mean Grad:      14.0 mmHg LVOT Vmax:         62.80 cm/s LVOT Vmean:        44.300 cm/s LVOT VTI:          0.113 m LVOT/AV VTI ratio: 0.25 AI PHT:            266 msec  AORTA Ao Root diam: 3.00 cm Ao Asc diam:   3.70 cm MITRAL VALVE                  TRICUSPID VALVE MV Area (PHT): 10.25 cm      TR Peak grad:   43.0 mmHg MV Decel Time: 74 msec        TR Vmax:        328.00 cm/s MR Peak grad:    106.9 mmHg MR Mean grad:    70.0 mmHg    SHUNTS MR Vmax:         517.00 cm/s  Systemic VTI:  0.11 m MR Vmean:        396.0 cm/s   Systemic Diam: 1.90 cm MR PISA:         1.01 cm MR PISA Eff ROA: 7 mm MR PISA Radius:  0.40 cm MV E velocity: 113.00 cm/s MV A velocity: 47.70 cm/s MV E/A ratio:  2.37 Mertie Moores MD Electronically signed by Mertie Moores MD Signature Date/Time: 06/25/2021/1:24:31 PM    Final    US Abdomen Complete  Result Date: 06/24/2021 CLINICAL DATA:  Elevated LFTs. EXAM: ABDOMEN ULTRASOUND COMPLETE COMPARISON:  PET-CT dated May 27, 2021. FINDINGS: Gallbladder: Small gallstones and sludge. No wall thickening visualized. No sonographic Murphy sign noted by sonographer. Common bile duct: Diameter: 3 mm, normal. Liver: No focal lesion identified. Within normal limits in parenchymal echogenicity. Portal vein is patent on color Doppler imaging with normal direction of blood flow towards the liver. IVC: No abnormality visualized. Pancreas: Visualized portion unremarkable. Spleen: Size and appearance within normal limits. Right Kidney: Length: 10.7 cm. Echogenicity within normal limits. No mass or hydronephrosis visualized. Left Kidney: Length:  9.8 cm. Echogenicity within normal limits. No mass or hydronephrosis visualized. Abdominal aorta: No aneurysm visualized. Other findings: Left pleural effusion. IMPRESSION: 1. No acute abnormality. 2. Cholelithiasis and sludge. 3. Left pleural effusion. Electronically Signed   By: Titus Dubin M.D.   On: 06/24/2021 18:58   DG Chest 2 View  Result Date: 06/24/2021 CLINICAL DATA:  Lung cancer, neutropenia, worsening cough EXAM: CHEST - 2 VIEW COMPARISON:  05/04/2021 FINDINGS: Cardiomegaly status post median sternotomy and CABG. Diffuse bilateral interstitial pulmonary  opacity, increased compared to prior examination. Small right pleural effusion, increased compared to prior. IMPRESSION: 1. Cardiomegaly with diffuse bilateral interstitial pulmonary opacity, consistent with edema or infection. 2. Small right pleural effusion, increased compared to prior examination. Electronically Signed   By: Delanna Ahmadi M.D.   On: 06/24/2021 15:54    ASSESSMENT AND PLAN: This is a very pleasant 71 years old white male with recently diagnosed stage IIIB (T4, N0/N2, M0) non-small cell lung cancer, adenocarcinoma presented with large left lower lobe lung mass and suspicious mediastinal lymphadenopathy diagnosed in May 2023. The patient has large volume of disease in that area and per discussion with radiation oncology it was advised to consider him for an induction chemotherapy for 2-3 cycles with the hope to decrease the volume of the disease to be irradiated in the near future. The patient is currently undergoing neoadjuvant systemic chemotherapy with carboplatin for AUC of 5 and Alimta 500 Mg/M2 status post 2 cycles. The patient continues to tolerate his treatment fairly well except for the fatigue. I recommended for him to proceed with cycle #3 but I will reduce the dose of carboplatin to AUC of 4 and Alimta 400 Mg/M2 because of the significant chemotherapy-induced pancytopenia with the last cycle of his treatment. I will see him back for follow-up visit in 3 weeks for evaluation with repeat CT scan of the chest for restaging of his disease. For the uncontrolled diabetes mellitus and hyperglycemia today, the patient will continue his current treatment with Ozempic and metformin.  We will give him 10 units of regular insulin. The patient was advised to call immediately if he has any other concerning symptoms in the interval.  The patient voices understanding of current disease status and treatment options and is in agreement with the current care plan.  All questions were answered.  The patient knows to call the clinic with any problems, questions or concerns. We can certainly see the patient much sooner if necessary.  The total time spent in the appointment was 30 minutes.  Disclaimer: This note was dictated with voice recognition software. Similar sounding words can inadvertently be transcribed and may not be corrected upon review.

## 2021-07-22 NOTE — Progress Notes (Signed)
Pt requested refill on Decodron. Refill sent to [facility] in [place].  Verbal order from Dr. Julien Nordmann for 10 units of regular insulin while in infusion. Spoke with Melissa in [place] who will place the order.

## 2021-07-22 NOTE — Progress Notes (Signed)
Patient recheck of blood sugar was 365. Dr. Julien Nordmann aware. Patient to treat further with medications at home. Patient post chemo blood pressure was low 81/56- Dr. Julien Nordmann aware.

## 2021-07-22 NOTE — Patient Instructions (Signed)
[facility]  Discharge Instructions: Thank you for choosing [name] to provide your oncology and hematology care.   If you have a lab appointment with the [facility], please go directly to the [facility] and check in at the registration area.   Wear comfortable clothing and clothing appropriate for easy access to any Portacath or PICC line.   We strive to give you quality time with your provider. You may need to reschedule your appointment if you arrive late (15 or more minutes).  Arriving late affects you and other patients whose appointments are after yours.  Also, if you miss three or more appointments without notifying the office, you may be dismissed from the clinic at the provider's discretion.      For prescription refill requests, have your pharmacy contact our office and allow 72 hours for refills to be completed.    Today you received the following chemotherapy and/or immunotherapy agents: Pemetrexed (Alimta) and Carboplatin.   To help prevent nausea and vomiting after your treatment, we encourage you to take your nausea medication as directed.  BELOW ARE SYMPTOMS THAT SHOULD BE REPORTED IMMEDIATELY: *FEVER GREATER THAN 100.4 F (38 C) OR HIGHER *CHILLS OR SWEATING *NAUSEA AND VOMITING THAT IS NOT CONTROLLED WITH YOUR NAUSEA MEDICATION *UNUSUAL SHORTNESS OF BREATH *UNUSUAL BRUISING OR BLEEDING *URINARY PROBLEMS (pain or burning when urinating, or frequent urination) *BOWEL PROBLEMS (unusual diarrhea, constipation, pain near the anus) TENDERNESS IN MOUTH AND THROAT WITH OR WITHOUT PRESENCE OF ULCERS (sore throat, sores in mouth, or a toothache) UNUSUAL RASH, SWELLING OR PAIN  UNUSUAL VAGINAL DISCHARGE OR ITCHING   Items with * indicate a potential emergency and should be followed up as soon as possible or go to the Emergency Department if any problems should occur.  Please show the CHEMOTHERAPY ALERT CARD or IMMUNOTHERAPY  ALERT CARD at check-in to the Emergency Department and triage nurse.  Should you have questions after your visit or need to cancel or reschedule your appointment, please contact [facility]  Dept: [phone]  and follow the prompts.  Office hours are 8:00 a.m. to 4:30 p.m. Monday - Friday. Please note that voicemails left after 4:00 p.m. may not be returned until the following business day.  We are closed weekends and major holidays. You have access to a nurse at all times for urgent questions. Please call the main number to the clinic Dept: [phone] and follow the prompts.   For any non-urgent questions, you may also contact your provider using MyChart. We now offer e-Visits for anyone 77 and older to request care online for non-urgent symptoms. For details visit [email or web address].   Also download the MyChart app! Go to the app store, search "MyChart", open the app, select [facility], and log in with your MyChart username and password.  Masks are optional in the cancer centers. If you would like for your care team to wear a mask while they are taking care of you, please let them know. For doctor visits, patients may have with them one support person who is at least 73 years old. At this time, visitors are not allowed in the infusion area.

## 2021-07-23 ENCOUNTER — Telehealth: Payer: Self-pay | Admitting: Physician Assistant

## 2021-07-23 NOTE — Telephone Encounter (Signed)
Scheduled per 07/21 los, patient has been called and notified of upcoming appointments.

## 2021-07-26 ENCOUNTER — Other Ambulatory Visit: Payer: Self-pay

## 2021-07-28 ENCOUNTER — Inpatient Hospital Stay: Payer: Medicare PPO

## 2021-07-28 ENCOUNTER — Other Ambulatory Visit: Payer: Self-pay | Admitting: Physician Assistant

## 2021-07-28 ENCOUNTER — Other Ambulatory Visit: Payer: Self-pay

## 2021-07-28 DIAGNOSIS — T451X5A Adverse effect of antineoplastic and immunosuppressive drugs, initial encounter: Secondary | ICD-10-CM | POA: Diagnosis not present

## 2021-07-28 DIAGNOSIS — E0965 Drug or chemical induced diabetes mellitus with hyperglycemia: Secondary | ICD-10-CM | POA: Diagnosis not present

## 2021-07-28 DIAGNOSIS — D61810 Antineoplastic chemotherapy induced pancytopenia: Secondary | ICD-10-CM | POA: Diagnosis not present

## 2021-07-28 DIAGNOSIS — C3432 Malignant neoplasm of lower lobe, left bronchus or lung: Secondary | ICD-10-CM | POA: Diagnosis not present

## 2021-07-28 DIAGNOSIS — D6481 Anemia due to antineoplastic chemotherapy: Secondary | ICD-10-CM

## 2021-07-28 DIAGNOSIS — Z79899 Other long term (current) drug therapy: Secondary | ICD-10-CM | POA: Diagnosis not present

## 2021-07-28 DIAGNOSIS — C3492 Malignant neoplasm of unspecified part of left bronchus or lung: Secondary | ICD-10-CM

## 2021-07-28 DIAGNOSIS — Z5111 Encounter for antineoplastic chemotherapy: Secondary | ICD-10-CM | POA: Diagnosis not present

## 2021-07-28 LAB — CMP (CANCER CENTER ONLY)
ALT: 70 U/L — ABNORMAL HIGH (ref 0–44)
AST: 58 U/L — ABNORMAL HIGH (ref 15–41)
Albumin: 3.1 g/dL — ABNORMAL LOW (ref 3.5–5.0)
Alkaline Phosphatase: 85 U/L (ref 38–126)
Anion gap: 13 (ref 5–15)
BUN: 37 mg/dL — ABNORMAL HIGH (ref 8–23)
CO2: 24 mmol/L (ref 22–32)
Calcium: 9.2 mg/dL (ref 8.9–10.3)
Chloride: 96 mmol/L — ABNORMAL LOW (ref 98–111)
Creatinine: 0.87 mg/dL (ref 0.61–1.24)
GFR, Estimated: >60 mL/min (ref >60)
Glucose, Bld: 208 mg/dL — ABNORMAL HIGH (ref 70–99)
Potassium: 3.5 mmol/L (ref 3.5–5.1)
Sodium: 133 mmol/L — ABNORMAL LOW (ref 135–145)
Total Bilirubin: 2.5 mg/dL — ABNORMAL HIGH (ref 0.3–1.2)
Total Protein: 6.8 g/dL (ref 6.5–8.1)

## 2021-07-28 LAB — CBC WITH DIFFERENTIAL (CANCER CENTER ONLY)
Abs Immature Granulocytes: 0.03 10*3/uL (ref 0.00–0.07)
Basophils Absolute: 0 10*3/uL (ref 0.0–0.1)
Basophils Relative: 0 %
Eosinophils Absolute: 0 10*3/uL (ref 0.0–0.5)
Eosinophils Relative: 0 %
HCT: 24.7 % — ABNORMAL LOW (ref 39.0–52.0)
Hemoglobin: 8.7 g/dL — ABNORMAL LOW (ref 13.0–17.0)
Immature Granulocytes: 1 %
Lymphocytes Relative: 20 %
Lymphs Abs: 0.8 10*3/uL (ref 0.7–4.0)
MCH: 33.5 pg (ref 26.0–34.0)
MCHC: 35.2 g/dL (ref 30.0–36.0)
MCV: 95 fL (ref 80.0–100.0)
Monocytes Absolute: 0 10*3/uL — ABNORMAL LOW (ref 0.1–1.0)
Monocytes Relative: 1 %
Neutro Abs: 3.1 10*3/uL (ref 1.7–7.7)
Neutrophils Relative %: 78 %
Platelet Count: 109 10*3/uL — ABNORMAL LOW (ref 150–400)
RBC: 2.6 MIL/uL — ABNORMAL LOW (ref 4.22–5.81)
RDW: 22 % — ABNORMAL HIGH (ref 11.5–15.5)
WBC Count: 4 10*3/uL (ref 4.0–10.5)
nRBC: 0 % (ref 0.0–0.2)

## 2021-07-29 ENCOUNTER — Inpatient Hospital Stay: Payer: Medicare PPO

## 2021-07-31 ENCOUNTER — Other Ambulatory Visit: Payer: Self-pay

## 2021-07-31 ENCOUNTER — Emergency Department (HOSPITAL_COMMUNITY)
Admission: EM | Admit: 2021-07-31 | Discharge: 2021-08-03 | Disposition: E | Payer: Medicare PPO | Attending: Emergency Medicine | Admitting: Emergency Medicine

## 2021-07-31 ENCOUNTER — Emergency Department (HOSPITAL_COMMUNITY): Payer: Medicare PPO

## 2021-07-31 DIAGNOSIS — K5900 Constipation, unspecified: Secondary | ICD-10-CM | POA: Diagnosis not present

## 2021-07-31 DIAGNOSIS — Z5111 Encounter for antineoplastic chemotherapy: Secondary | ICD-10-CM | POA: Diagnosis not present

## 2021-07-31 DIAGNOSIS — A419 Sepsis, unspecified organism: Secondary | ICD-10-CM | POA: Diagnosis not present

## 2021-07-31 DIAGNOSIS — J90 Pleural effusion, not elsewhere classified: Secondary | ICD-10-CM | POA: Diagnosis not present

## 2021-07-31 DIAGNOSIS — Z7982 Long term (current) use of aspirin: Secondary | ICD-10-CM | POA: Insufficient documentation

## 2021-07-31 DIAGNOSIS — I469 Cardiac arrest, cause unspecified: Secondary | ICD-10-CM | POA: Insufficient documentation

## 2021-07-31 DIAGNOSIS — Z794 Long term (current) use of insulin: Secondary | ICD-10-CM | POA: Diagnosis not present

## 2021-07-31 DIAGNOSIS — R0602 Shortness of breath: Secondary | ICD-10-CM | POA: Diagnosis not present

## 2021-07-31 DIAGNOSIS — R079 Chest pain, unspecified: Secondary | ICD-10-CM | POA: Diagnosis not present

## 2021-07-31 DIAGNOSIS — E0965 Drug or chemical induced diabetes mellitus with hyperglycemia: Secondary | ICD-10-CM | POA: Diagnosis not present

## 2021-07-31 DIAGNOSIS — J189 Pneumonia, unspecified organism: Secondary | ICD-10-CM | POA: Insufficient documentation

## 2021-07-31 DIAGNOSIS — J9601 Acute respiratory failure with hypoxia: Secondary | ICD-10-CM | POA: Diagnosis not present

## 2021-07-31 DIAGNOSIS — J449 Chronic obstructive pulmonary disease, unspecified: Secondary | ICD-10-CM | POA: Diagnosis not present

## 2021-07-31 DIAGNOSIS — D61810 Antineoplastic chemotherapy induced pancytopenia: Secondary | ICD-10-CM | POA: Diagnosis not present

## 2021-07-31 DIAGNOSIS — Z85118 Personal history of other malignant neoplasm of bronchus and lung: Secondary | ICD-10-CM | POA: Diagnosis not present

## 2021-07-31 DIAGNOSIS — C3432 Malignant neoplasm of lower lobe, left bronchus or lung: Secondary | ICD-10-CM | POA: Diagnosis not present

## 2021-07-31 DIAGNOSIS — T451X5A Adverse effect of antineoplastic and immunosuppressive drugs, initial encounter: Secondary | ICD-10-CM | POA: Diagnosis not present

## 2021-07-31 DIAGNOSIS — R6521 Severe sepsis with septic shock: Secondary | ICD-10-CM | POA: Diagnosis not present

## 2021-07-31 DIAGNOSIS — Z7902 Long term (current) use of antithrombotics/antiplatelets: Secondary | ICD-10-CM | POA: Diagnosis not present

## 2021-07-31 DIAGNOSIS — Z7951 Long term (current) use of inhaled steroids: Secondary | ICD-10-CM | POA: Diagnosis not present

## 2021-07-31 DIAGNOSIS — I509 Heart failure, unspecified: Secondary | ICD-10-CM | POA: Diagnosis not present

## 2021-07-31 DIAGNOSIS — R1084 Generalized abdominal pain: Secondary | ICD-10-CM | POA: Insufficient documentation

## 2021-07-31 DIAGNOSIS — J9621 Acute and chronic respiratory failure with hypoxia: Secondary | ICD-10-CM | POA: Insufficient documentation

## 2021-07-31 DIAGNOSIS — D72819 Decreased white blood cell count, unspecified: Secondary | ICD-10-CM | POA: Insufficient documentation

## 2021-07-31 DIAGNOSIS — R109 Unspecified abdominal pain: Secondary | ICD-10-CM | POA: Diagnosis not present

## 2021-07-31 DIAGNOSIS — Z79899 Other long term (current) drug therapy: Secondary | ICD-10-CM | POA: Diagnosis not present

## 2021-07-31 LAB — COMPREHENSIVE METABOLIC PANEL
ALT: 188 U/L — ABNORMAL HIGH (ref 0–44)
AST: 252 U/L — ABNORMAL HIGH (ref 15–41)
Albumin: 2.6 g/dL — ABNORMAL LOW (ref 3.5–5.0)
Alkaline Phosphatase: 74 U/L (ref 38–126)
Anion gap: 25 — ABNORMAL HIGH (ref 5–15)
BUN: 54 mg/dL — ABNORMAL HIGH (ref 8–23)
CO2: 7 mmol/L — ABNORMAL LOW (ref 22–32)
Calcium: 8.6 mg/dL — ABNORMAL LOW (ref 8.9–10.3)
Chloride: 101 mmol/L (ref 98–111)
Creatinine, Ser: 1.35 mg/dL — ABNORMAL HIGH (ref 0.61–1.24)
GFR, Estimated: 55 mL/min — ABNORMAL LOW (ref >60)
Glucose, Bld: 135 mg/dL — ABNORMAL HIGH (ref 70–99)
Potassium: 4.4 mmol/L (ref 3.5–5.1)
Sodium: 133 mmol/L — ABNORMAL LOW (ref 135–145)
Total Bilirubin: 2.8 mg/dL — ABNORMAL HIGH (ref 0.3–1.2)
Total Protein: 6 g/dL — ABNORMAL LOW (ref 6.5–8.1)

## 2021-07-31 LAB — CBG MONITORING, ED: Glucose-Capillary: 120 mg/dL — ABNORMAL HIGH (ref 70–99)

## 2021-07-31 LAB — BLOOD GAS, VENOUS
Acid-base deficit: 19.8 mmol/L — ABNORMAL HIGH (ref 0.0–2.0)
Bicarbonate: 7.1 mmol/L — ABNORMAL LOW (ref 20.0–28.0)
O2 Saturation: 30.2 %
Patient temperature: 37.1
pCO2, Ven: 20 mmHg — ABNORMAL LOW (ref 44–60)
pH, Ven: 7.16 — CL (ref 7.25–7.43)
pO2, Ven: 32 mmHg (ref 32–45)

## 2021-07-31 LAB — CBC
HCT: 24 % — ABNORMAL LOW (ref 39.0–52.0)
Hemoglobin: 7.7 g/dL — ABNORMAL LOW (ref 13.0–17.0)
MCH: 33.2 pg (ref 26.0–34.0)
MCHC: 32.1 g/dL (ref 30.0–36.0)
MCV: 103.4 fL — ABNORMAL HIGH (ref 80.0–100.0)
Platelets: 38 10*3/uL — ABNORMAL LOW (ref 150–400)
RBC: 2.32 MIL/uL — ABNORMAL LOW (ref 4.22–5.81)
RDW: 21.8 % — ABNORMAL HIGH (ref 11.5–15.5)
WBC: 1 10*3/uL — CL (ref 4.0–10.5)
nRBC: 0 % (ref 0.0–0.2)

## 2021-07-31 LAB — PROTIME-INR
INR: 1.9 — ABNORMAL HIGH (ref 0.8–1.2)
Prothrombin Time: 21.8 seconds — ABNORMAL HIGH (ref 11.4–15.2)

## 2021-07-31 LAB — LACTIC ACID, PLASMA: Lactic Acid, Venous: >9 mmol/L (ref 0.5–1.9)

## 2021-07-31 LAB — BRAIN NATRIURETIC PEPTIDE: B Natriuretic Peptide: >4500 pg/mL — ABNORMAL HIGH (ref 0.0–100.0)

## 2021-07-31 LAB — TROPONIN I (HIGH SENSITIVITY): Troponin I (High Sensitivity): 162 ng/L (ref <18)

## 2021-07-31 MED ORDER — EPINEPHRINE 1 MG/10ML IJ SOSY
PREFILLED_SYRINGE | INTRAMUSCULAR | Status: AC | PRN
  Administered 2021-07-31 (×3): 1 mg via INTRAVENOUS
  Administered 2021-07-31: .3 mg via INTRAVENOUS

## 2021-07-31 MED ORDER — PIPERACILLIN-TAZOBACTAM 3.375 G IVPB 30 MIN: 3.3750 g | Freq: Once | INTRAVENOUS | Status: DC

## 2021-07-31 MED ORDER — ETOMIDATE 2 MG/ML IV SOLN
INTRAVENOUS | Status: AC
  Filled 2021-07-31: qty 10

## 2021-07-31 MED ORDER — SUCCINYLCHOLINE CHLORIDE 200 MG/10ML IV SOSY
PREFILLED_SYRINGE | INTRAVENOUS | Status: AC
  Filled 2021-07-31: qty 10

## 2021-07-31 MED ORDER — SODIUM CHLORIDE (PF) 0.9 % IJ SOLN
INTRAMUSCULAR | Status: AC
  Filled 2021-07-31: qty 50

## 2021-07-31 MED ORDER — EPINEPHRINE 1 MG/10ML IJ SOSY
PREFILLED_SYRINGE | INTRAMUSCULAR | Status: AC | PRN
  Administered 2021-07-31: .3 mg via INTRAVENOUS
  Administered 2021-07-31: 1 mg via INTRAVENOUS

## 2021-07-31 MED ORDER — VANCOMYCIN HCL IN DEXTROSE 1-5 GM/200ML-% IV SOLN: 1000.0000 mg | Freq: Once | INTRAVENOUS | Status: DC

## 2021-07-31 MED ORDER — SODIUM CHLORIDE 0.9 % IV BOLUS: 1000.0000 mL | Freq: Once | INTRAVENOUS | Status: DC

## 2021-07-31 MED ORDER — KETAMINE HCL 50 MG/5ML IJ SOSY
PREFILLED_SYRINGE | INTRAMUSCULAR | Status: AC
  Filled 2021-07-31: qty 5

## 2021-07-31 MED ORDER — PIPERACILLIN-TAZOBACTAM 4.5 G IVPB: 4.5000 g | Freq: Once | INTRAVENOUS | Status: DC

## 2021-07-31 MED ORDER — NOREPINEPHRINE 4 MG/250ML-% IV SOLN
INTRAVENOUS | Status: AC | PRN
  Administered 2021-07-31: 10 ug/min via INTRAVENOUS

## 2021-07-31 MED ORDER — SODIUM CHLORIDE 0.9 % IV SOLN: 500.0000 mg | INTRAVENOUS | Status: DC

## 2021-07-31 MED ORDER — VANCOMYCIN HCL 1250 MG/250ML IV SOLN
1250.0000 mg | Freq: Once | INTRAVENOUS | Status: DC
  Filled 2021-07-31: qty 250

## 2021-07-31 MED ORDER — SODIUM BICARBONATE 8.4 % IV SOLN
INTRAVENOUS | Status: AC | PRN
  Administered 2021-07-31: 50 meq via INTRAVENOUS

## 2021-07-31 MED ORDER — NOREPINEPHRINE 4 MG/250ML-% IV SOLN
INTRAVENOUS | Status: AC
  Filled 2021-07-31: qty 250

## 2021-07-31 MED ORDER — ETOMIDATE 2 MG/ML IV SOLN
INTRAVENOUS | Status: AC
  Filled 2021-07-31: qty 20

## 2021-07-31 MED ORDER — EPINEPHRINE 1 MG/10ML IJ SOSY
PREFILLED_SYRINGE | INTRAMUSCULAR | Status: AC | PRN
  Administered 2021-07-31: 1 mg via INTRAVENOUS
  Administered 2021-07-31: .3 mg via INTRAVENOUS

## 2021-07-31 MED ORDER — FENTANYL CITRATE PF 50 MCG/ML IJ SOSY
PREFILLED_SYRINGE | INTRAMUSCULAR | Status: AC
  Filled 2021-07-31: qty 2

## 2021-07-31 MED ORDER — MIDAZOLAM HCL 2 MG/2ML IJ SOLN
INTRAMUSCULAR | Status: AC
  Filled 2021-07-31: qty 2

## 2021-07-31 MED ORDER — ROCURONIUM BROMIDE 10 MG/ML (PF) SYRINGE
PREFILLED_SYRINGE | INTRAVENOUS | Status: AC
  Filled 2021-07-31: qty 10

## 2021-07-31 MED ORDER — ALBUTEROL SULFATE HFA 108 (90 BASE) MCG/ACT IN AERS: 2.0000 | INHALATION_SPRAY | RESPIRATORY_TRACT | Status: DC | PRN

## 2021-07-31 MED ORDER — IOHEXOL 350 MG/ML SOLN
100.0000 mL | Freq: Once | INTRAVENOUS | Status: AC | PRN
  Administered 2021-07-31: 100 mL via INTRAVENOUS

## 2021-07-31 MED FILL — Medication: Qty: 1 | Status: AC

## 2021-07-31 NOTE — Code Documentation (Signed)
CPR stopped

## 2021-07-31 NOTE — ED Notes (Signed)
WBCs 1.0 read back and verified with Toni Arthurs.

## 2021-07-31 NOTE — Code Documentation (Signed)
Pulse check CPR continued

## 2021-07-31 NOTE — ED Notes (Signed)
Refused to ambulate prior to discharge, stated he was leaving.

## 2021-07-31 NOTE — ED Triage Notes (Signed)
Pt BIBA from home. Pt called for abd pain and constipation for 2x days. On arrival pt had laboured breathing w/decreased bilat lung sounds.  Given cont duo-neb and albuterol treatment en route.  Pt pale upon arrival  BP: 101/87 HR: 110 SPO2: 96 on 4L

## 2021-07-31 NOTE — Code Documentation (Signed)
Pulse checked CPR continued

## 2021-07-31 NOTE — ED Notes (Signed)
Transported to CT with respiratory therapy and RN in attendance.

## 2021-07-31 NOTE — ED Provider Notes (Signed)
Called emergently to the bedside as patient was unresponsive after CT scan. Found to be pulses/agonal respirations. CPR started.  He was intubated though he seemed to breathing on his own during compressions. CPR continued. Unfortunately he ultimately expired. For further details see Dr. Shon Baton note  Procedure Name: Intubation Date/Time: 07/24/2021 8:41 PM  Performed by: Sherwood Gambler, MDPre-anesthesia Checklist: Patient identified, Patient being monitored, Emergency Drugs available, Timeout performed and Suction available Oxygen Delivery Method: Non-rebreather mask Preoxygenation: Pre-oxygenation with 100% oxygen Induction Type: Rapid sequence Ventilation: Mask ventilation without difficulty Laryngoscope Size: Glidescope and 3 Grade View: Grade II Tube size: 8.0 mm Number of attempts: 2 Airway Equipment and Method: Video-laryngoscopy Placement Confirmation: ETT inserted through vocal cords under direct vision and CO2 detector Tube secured with: ETT holder Dental Injury: Teeth and Oropharynx as per pre-operative assessment         Sherwood Gambler, MD 07/08/2021 2042

## 2021-07-31 NOTE — Code Documentation (Signed)
Pulse check - pulse noted CPR stopped

## 2021-07-31 NOTE — ED Notes (Addendum)
Pt had agonal breathing and faint pulses - CPR began  - Pt NO LONGER ON BIPAP - BVM being uses at this time

## 2021-07-31 NOTE — ED Provider Notes (Signed)
[facility] DEPT Provider Note   CSN: [id number] Arrival date & time: 08/02/2021  1811     History {Add pertinent medical, surgical, social history, OB history to HPI:1} Chief Complaint  Patient presents with   Shortness of Breath   Abdominal Pain    [name] [name] is a 71 y.o. male.  71 year old male with a history of stage III lung cancer on chemotherapy, COPD not on home oxygen, and CHF presents to the emergency department with shortness of breath and constipation.  History primarily obtained from the patient's wife who states that 2 days ago he started experiencing constipation and abdominal pain.  She also noticed that he had a dry cough and was working harder to breathe which gradually worsened so they brought him to the emergency department today.  Says that he has had bilateral lower extremity swelling as well.  He denies any chest pain at all.  Says that he does have generalized abdominal pain that has been present for the past 2 days.  Also with constipation and decreased passage of flatus.  Feels that his right leg may be slightly more swollen than the left.  Has had chills but no fevers at home.  Denies any nausea or vomiting.  No dysuria or frequency.  Denies history of intubations for COPD.   Shortness of Breath Abdominal Pain      Home Medications Prior to Admission medications   Medication Sig Start Date End Date Taking? Authorizing Provider  ACCU-CHEK GUIDE test strip  06/29/21   [provider]  Accu-Chek Softclix Lancets lancets  06/29/21   [provider]  acetaminophen (TYLENOL) 500 MG tablet Take 1 tablet (500 mg total) by mouth every 8 (eight) hours as needed for mild pain. Please do not take tylenol if your liver function is abnormal 06/27/21   Florencia Reasons, MD  ALPRAZolam Duanne Moron) 0.25 MG tablet Take 0.25 mg by mouth at bedtime as needed for anxiety (restless legs).    [provider]  aspirin 81 MG tablet Take  1 tablet (81 mg total) by mouth daily. Hold if you have bleeding or low platelet 06/27/21   Florencia Reasons, MD  atorvastatin (LIPITOR) 80 MG tablet Take 1 tablet (80 mg total) by mouth daily. Hold this medication, can resume when your liver function become normal 06/27/21   Florencia Reasons, MD  Azelastine HCl 0.15 % SOLN Place 1 spray into both nostrils daily as needed (Allergies). 02/05/18   [provider]  Blood Glucose Monitoring Suppl (ACCU-CHEK GUIDE) w/Device KIT  06/29/21   [provider]  carvedilol (COREG) 6.25 MG tablet Take 1 tablet (6.25 mg total) by mouth 2 (two) times daily with a meal. Please take half a tab twice a day for the next few days, please check your blood pressure at home, bring record for your pcp to review, your pcp to decide on when you can go back on one full tab twice a day 06/27/21   Florencia Reasons, MD  cholecalciferol (VITAMIN D) 1000 UNITS tablet Take 1,000 Units by mouth daily.     [provider]  clopidogrel (PLAVIX) 75 MG tablet Take 75 mg by mouth daily with breakfast.    [provider]  cromolyn (OPTICROM) 4 % ophthalmic solution Place 1 drop into both eyes daily. 05/24/21   [provider]  dexamethasone (DECADRON) 4 MG tablet 4 mg p.o. twice daily the day before, day of and day after the chemotherapy every 3 weeks 07/22/21  Curt Bears, MD  ezetimibe (ZETIA) 10 MG tablet TAKE 1 TABLET EVERY DAY Patient taking differently: Take 10 mg by mouth daily. 09/14/20   Belva Crome, MD  fluticasone (FLONASE) 50 MCG/ACT nasal spray Place 2 sprays into the nose See admin instructions. Instill 2 sprays into each nostril one to two times a day    [provider]  Fluticasone-Umeclidin-Vilant (TRELEGY ELLIPTA) 100-62.5-25 MCG/ACT AEPB Inhale 1 puff into the lungs daily. 07/19/21   Freddi Starr, MD  folic acid (FOLVITE) 1 MG tablet Take 1 tablet (1 mg total) by mouth daily. 06/03/21   Curt Bears, MD  furosemide (LASIX) 40 MG tablet  Take 1 tablet (40 mg total) by mouth daily. Please continue to weight yourself daily, and check your feet/ankle/legs Please take extra dose of lasix if your weight go up and your legs are swollen, please call your doctor if your have to take extra dose of lasix due to weight gain and being swollen 06/27/21   Florencia Reasons, MD  glimepiride (AMARYL) 1 MG tablet Take 1 mg by mouth every morning. 07/27/21   [provider]  isosorbide mononitrate (IMDUR) 30 MG 24 hr tablet Take one tablet by mouth once daily along with your Imdur 55m tablet for a total of 931m4/3/23   SmBelva CromeMD  isosorbide mononitrate (IMDUR) 60 MG 24 hr tablet Take 1 tablet (60 mg total) by mouth daily. Please hold this medication, please check your blood pressure at home, bring record to your pcp next week for further blood pressure meds adjustment 06/27/21   XuFlorencia ReasonsMD  loratadine (CLARITIN) 10 MG tablet Take 10 mg by mouth daily.    [provider]  magic mouthwash SOLN Take 5 mLs by mouth 4 (four) times daily as needed for mouth pain (for sore mouth). 06/17/21   Heilingoetter, Cassandra L, PA-C  metFORMIN (GLUCOPHAGE) 1000 MG tablet Take 1,000 mg by mouth 2 (two) times daily with a meal.    [provider]  nitroGLYCERIN (NITROSTAT) 0.4 MG SL tablet Place 0.4 mg under the tongue every 5 (five) minutes x 3 doses as needed for chest pain.    [provider]  Polyvinyl Alcohol-Povidone (CLEAR EYES ALL SEASONS OP) Place 1 drop into both eyes daily as needed (for dry eyes).    [provider]  prochlorperazine (COMPAZINE) 10 MG tablet TAKE 1 TABLET BY MOUTH EVERY 6 HOURS AS NEEDED FOR NAUSEA OR VOMITING 07/05/21   MoCurt BearsMD  ranolazine (RANEXA) 500 MG 12 hr tablet TAKE 1 TABLET TWICE DAILY Patient taking differently: Take 500 mg by mouth 2 (two) times daily. 12/04/20   SmBelva CromeMD  sacubitril-valsartan (ENTRESTO) 97-103 MG Take 1 tablet by mouth 2 (two) times daily. 09/16/20    SmBelva CromeMD  Semaglutide,0.25 or 0.5MG/DOS, (OZEMPIC, 0.25 OR 0.5 MG/DOSE,) 2 MG/1.5ML SOPN Inject 0.25 mg into the skin every Saturday.    [provider]      Allergies    Tape, Amlodipine, Dapagliflozin, Dulaglutide, Oxycodone-acetaminophen, Spironolactone, Codeine, and Rosiglitazone    Review of Systems   Review of Systems  Physical Exam Updated Vital Signs BP (!) 180/40   Pulse (!) 132   Temp 97.9 F (36.6 C) (Oral)   Resp 15   Ht _0  (1.676 m)   Wt 66 kg   SpO2 (!) 79% Comment: Pulse Ox not reading, extremities cold to touch, multiple sites tried.  BMI 23.48 kg/m  Physical Exam  Vitals and nursing note reviewed.  Constitutional:      General: He is in acute distress.     Appearance: He is ill-appearing.     Comments: Tachypneic but still able to speak in short sentences.  Responds appropriately to questions though is hard of hearing.  HENT:     Head: Normocephalic and atraumatic.     Mouth/Throat:     Mouth: Mucous membranes are moist.     Pharynx: Oropharynx is clear.  Eyes:     Extraocular Movements: Extraocular movements intact.     Conjunctiva/sclera: Conjunctivae normal.     Pupils: Pupils are equal, round, and reactive to light.  Cardiovascular:     Rate and Rhythm: Normal rate and regular rhythm. No extrasystoles are present.    Pulses: No decreased pulses.     Heart sounds: No murmur heard. Pulmonary:     Effort: Tachypnea and accessory muscle usage (Intercostal retractions and belly breathing) present.     Comments: Crackles heard in left base.  No significant wheezing or rhonchi present.  Right hemifield clear to auscultation.  On 6L nasal cannula satting 99%. Abdominal:     Palpations: Abdomen is soft.     Tenderness: There is no abdominal tenderness.  Musculoskeletal:        General: No swelling.     Cervical back: Normal range of motion and neck supple.     Right lower leg: Edema (3+) present.     Left lower leg: Edema (2+)  present.  Skin:    General: Skin is warm and dry.     Capillary Refill: Capillary refill takes 2 to 3 seconds.  Neurological:     General: No focal deficit present.     Mental Status: He is alert and oriented to person, place, and time.  Psychiatric:        Mood and Affect: Mood normal.     ED Results / Procedures / Treatments   Labs (all labs ordered are listed, but only abnormal results are displayed) Labs Reviewed  LACTIC ACID, PLASMA - Abnormal; Notable for the following components:      Result Value   Lactic Acid, Venous >9.0 (*)    All other components within normal limits  CBC - Abnormal; Notable for the following components:   WBC 1.0 (*)    RBC 2.32 (*)    Hemoglobin 7.7 (*)    HCT 24.0 (*)    MCV 103.4 (*)    RDW 21.8 (*)    Platelets 38 (*)    All other components within normal limits  COMPREHENSIVE METABOLIC PANEL - Abnormal; Notable for the following components:   Sodium 133 (*)    CO2 7 (*)    Glucose, Bld 135 (*)    BUN 54 (*)    Creatinine, Ser 1.35 (*)    Calcium 8.6 (*)    Total Protein 6.0 (*)    Albumin 2.6 (*)    AST 252 (*)    ALT 188 (*)    Total Bilirubin 2.8 (*)    GFR, Estimated 55 (*)    Anion gap 25 (*)    All other components within normal limits  PROTIME-INR - Abnormal; Notable for the following components:   Prothrombin Time 21.8 (*)    INR 1.9 (*)    All other components within normal limits  BLOOD GAS, VENOUS - Abnormal; Notable for the following components:   pH, Ven 7.16 (*)    pCO2, Ven 20 (*)  Bicarbonate 7.1 (*)    Acid-base deficit 19.8 (*)    All other components within normal limits  CBG MONITORING, ED - Abnormal; Notable for the following components:   Glucose-Capillary 120 (*)    All other components within normal limits  TROPONIN I (HIGH SENSITIVITY) - Abnormal; Notable for the following components:   Troponin I (High Sensitivity) 162 (*)    All other components within normal limits  CULTURE, BLOOD (ROUTINE X 2)   CULTURE, BLOOD (ROUTINE X 2)  URINE CULTURE  LACTIC ACID, PLASMA  URINALYSIS, ROUTINE W REFLEX MICROSCOPIC  BRAIN NATRIURETIC PEPTIDE  TROPONIN I (HIGH SENSITIVITY)    EKG None  Radiology CT Angio Chest Pulmonary Embolism (PE) W or WO Contrast  Result Date: 07/28/2021 CLINICAL DATA:  Acute shortness of breath. Abnormal breath sounds. Altered mental status. Abdominal pain and constipation for several days. Non-small cell lung carcinoma. * Tracking Code: BO * EXAM: CT ANGIOGRAPHY CHEST CT ABDOMEN AND PELVIS WITH CONTRAST TECHNIQUE: Multidetector CT imaging of the chest was performed using the standard protocol during bolus administration of intravenous contrast. Multiplanar CT image reconstructions and MIPs were obtained to evaluate the vascular anatomy. Multidetector CT imaging of the abdomen and pelvis was performed using the standard protocol during bolus administration of intravenous contrast. RADIATION DOSE REDUCTION: This exam was performed according to the departmental dose-optimization program which includes automated exposure control, adjustment of the mA and/or kV according to patient size and/or use of iterative reconstruction technique. CONTRAST:  163m OMNIPAQUE IOHEXOL 350 MG/ML SOLN COMPARISON:  PET-CT on 05/27/2021 FINDINGS: CTA CHEST FINDINGS Cardiovascular: Satisfactory opacification of pulmonary arteries noted, however image degradation is seen due to respiratory motion artifact. No definite pulmonary emboli identified. No evidence of thoracic aortic aneurysm. Aortic and coronary atherosclerotic calcification incidentally noted. Mild cardiomegaly. Prior CABG. Reflux of contrast into the IVC and hepatic veins is consistent with right heart insufficiency. Mediastinum/Nodes: No masses or pathologically enlarged lymph nodes identified. Lungs/Pleura: Small bilateral pleural effusions are seen. Diffuse interstitial prominence is seen bilaterally, with asymmetric patchy areas of airspace  disease in the left upper and lower lobes. This may be due to asymmetric pulmonary edema or pneumonia. Musculoskeletal: No suspicious bone lesions identified. Review of the MIP images confirms the above findings. CT ABDOMEN and PELVIS FINDINGS Hepatobiliary: No hepatic masses identified. Gallbladder is unremarkable. No evidence of biliary ductal dilatation. Pancreas:  No mass or inflammatory changes. Spleen: Within normal limits in size and appearance. Adrenals/Urinary Tract: Multiple ill-defined areas of decreased parenchymal enhancement are seen in both kidneys, although image degradation due to motion artifact is noted. Pyelonephritis cannot be excluded. No evidence of renal mass or abscess. No evidence of ureteral calculi or hydronephrosis. Unremarkable unopacified urinary bladder. Stomach/Bowel: No evidence of obstruction, inflammatory process or abnormal fluid collections. Vascular/Lymphatic: No pathologically enlarged lymph nodes. No acute vascular findings. Aortic atherosclerotic calcification incidentally noted. Reproductive:  No mass or other significant abnormality. Other:  None. Musculoskeletal:  No suspicious bone lesions identified. Review of the MIP images confirms the above findings. IMPRESSION: Image degradation due to respiratory motion artifact. No definite evidence of pulmonary embolism. Small bilateral pleural effusions, diffuse interstitial prominence, and asymmetric left lung airspace disease. Differential diagnosis includes asymmetric pulmonary edema, pneumonia, or other inflammatory process Ill-defined areas of decreased parenchymal enhancement in both kidneys, although image degradation is noted due to motion artifact. Bilateral pyelonephritis cannot be excluded. Suggest correlation with urinalysis. Aortic Atherosclerosis (ICD10-I70.0). Electronically Signed   By: JMarlaine HindM.D.   On: 07/07/2021  20:27   CT ABDOMEN PELVIS W CONTRAST  Result Date: 07/10/2021 CLINICAL DATA:  Acute  shortness of breath. Abnormal breath sounds. Altered mental status. Abdominal pain and constipation for several days. Non-small cell lung carcinoma. * Tracking Code: BO * EXAM: CT ANGIOGRAPHY CHEST CT ABDOMEN AND PELVIS WITH CONTRAST TECHNIQUE: Multidetector CT imaging of the chest was performed using the standard protocol during bolus administration of intravenous contrast. Multiplanar CT image reconstructions and MIPs were obtained to evaluate the vascular anatomy. Multidetector CT imaging of the abdomen and pelvis was performed using the standard protocol during bolus administration of intravenous contrast. RADIATION DOSE REDUCTION: This exam was performed according to the departmental dose-optimization program which includes automated exposure control, adjustment of the mA and/or kV according to patient size and/or use of iterative reconstruction technique. CONTRAST:  162m OMNIPAQUE IOHEXOL 350 MG/ML SOLN COMPARISON:  PET-CT on 05/27/2021 FINDINGS: CTA CHEST FINDINGS Cardiovascular: Satisfactory opacification of pulmonary arteries noted, however image degradation is seen due to respiratory motion artifact. No definite pulmonary emboli identified. No evidence of thoracic aortic aneurysm. Aortic and coronary atherosclerotic calcification incidentally noted. Mild cardiomegaly. Prior CABG. Reflux of contrast into the IVC and hepatic veins is consistent with right heart insufficiency. Mediastinum/Nodes: No masses or pathologically enlarged lymph nodes identified. Lungs/Pleura: Small bilateral pleural effusions are seen. Diffuse interstitial prominence is seen bilaterally, with asymmetric patchy areas of airspace disease in the left upper and lower lobes. This may be due to asymmetric pulmonary edema or pneumonia. Musculoskeletal: No suspicious bone lesions identified. Review of the MIP images confirms the above findings. CT ABDOMEN and PELVIS FINDINGS Hepatobiliary: No hepatic masses identified. Gallbladder is  unremarkable. No evidence of biliary ductal dilatation. Pancreas:  No mass or inflammatory changes. Spleen: Within normal limits in size and appearance. Adrenals/Urinary Tract: Multiple ill-defined areas of decreased parenchymal enhancement are seen in both kidneys, although image degradation due to motion artifact is noted. Pyelonephritis cannot be excluded. No evidence of renal mass or abscess. No evidence of ureteral calculi or hydronephrosis. Unremarkable unopacified urinary bladder. Stomach/Bowel: No evidence of obstruction, inflammatory process or abnormal fluid collections. Vascular/Lymphatic: No pathologically enlarged lymph nodes. No acute vascular findings. Aortic atherosclerotic calcification incidentally noted. Reproductive:  No mass or other significant abnormality. Other:  None. Musculoskeletal:  No suspicious bone lesions identified. Review of the MIP images confirms the above findings. IMPRESSION: Image degradation due to respiratory motion artifact. No definite evidence of pulmonary embolism. Small bilateral pleural effusions, diffuse interstitial prominence, and asymmetric left lung airspace disease. Differential diagnosis includes asymmetric pulmonary edema, pneumonia, or other inflammatory process Ill-defined areas of decreased parenchymal enhancement in both kidneys, although image degradation is noted due to motion artifact. Bilateral pyelonephritis cannot be excluded. Suggest correlation with urinalysis. Aortic Atherosclerosis (ICD10-I70.0). Electronically Signed   By: JMarlaine HindM.D.   On: 07/19/2021 20:27   DG Chest 1 View  Result Date: 07/19/2021 CLINICAL DATA:  Abdominal pain. EXAM: CHEST  1 VIEW COMPARISON:  Chest x-ray 06/24/2021.  Chest CT 04/30/2021. FINDINGS: Cardiomediastinal silhouette is stable, the heart is enlarged. Patient is status post cardiac surgery. There is some minimal patchy airspace opacities in the retrocardiac region similar to prior study. There is no pleural  effusion or pneumothorax. No acute fractures are seen. IMPRESSION: Minimal retrocardiac airspace disease. Correlate clinically for infection. Stable cardiomegaly. Electronically Signed   By: ARonney AstersM.D.   On: 07/18/2021 19:13    Procedures CPR  Date/Time: 07/04/2021 9:14 PM  Performed by: PFransico Meadow MD Authorized  by: Fransico Meadow, MD  CPR Procedure Details:      Amount of time prior to administration of ACLS/BLS (minutes):  0   CPR/ACLS performed in the ED: Yes     Duration of CPR (minutes):  38   Outcome: Pt declared dead    CPR performed via ACLS guidelines under my direct supervision.  See RN documentation for details including defibrillator use, medications, doses and timing. .Critical Care  Performed by: Fransico Meadow, MD Authorized by: Fransico Meadow, MD   Critical care provider statement:    Critical care time (minutes):  60   Critical care was necessary to treat or prevent imminent or life-threatening deterioration of the following conditions:  Sepsis, respiratory failure, cardiac failure and shock   Critical care was time spent personally by me on the following activities:  Ordering and performing treatments and interventions, development of treatment plan with patient or surrogate, ordering and review of laboratory studies, ordering and review of radiographic studies, pulse oximetry, re-evaluation of patient's condition, examination of patient and obtaining history from patient or surrogate   I assumed direction of critical care for this patient from another provider in my specialty: yes     {Document cardiac monitor, telemetry assessment procedure when appropriate:1}  Medications Ordered in ED Medications  albuterol (VENTOLIN HFA) 108 (90 Base) MCG/ACT inhaler 2 puff (has no administration in time range)  azithromycin (ZITHROMAX) 500 mg in sodium chloride 0.9 % 250 mL IVPB (has no administration in time range)  piperacillin-tazobactam (ZOSYN)  IVPB 3.375 g (has no administration in time range)  vancomycin (VANCOREADY) IVPB 1250 mg/250 mL (has no administration in time range)  sodium chloride (PF) 0.9 % injection (has no administration in time range)  sodium chloride 0.9 % bolus 1,000 mL (has no administration in time range)  succinylcholine (ANECTINE) 200 MG/10ML syringe (has no administration in time range)  etomidate (AMIDATE) 2 MG/ML injection (has no administration in time range)  rocuronium bromide 100 MG/10ML SOSY (has no administration in time range)  midazolam (VERSED) 2 MG/2ML injection (has no administration in time range)  fentaNYL (SUBLIMAZE) 50 MCG/ML injection (has no administration in time range)  ketamine HCl 50 MG/5ML SOSY (has no administration in time range)  etomidate (AMIDATE) 2 MG/ML injection (has no administration in time range)  EPINEPHrine (ADRENALIN) 1 MG/10ML injection (1 mg Intravenous Given 08/02/2021 2001)  norepinephrine (LEVOPHED) 4-5 MG/250ML-% infusion SOLN (has no administration in time range)  EPINEPHrine (ADRENALIN) 1 MG/10ML injection (1 mg Intravenous Given 07/20/2021 2015)  norepinephrine (LEVOPHED) 10m in 2556m(0.016 mg/mL) premix infusion (0 mcg/kg/min Intravenous Stopped 07/14/2021 2026)  sodium bicarbonate injection (50 mEq Intravenous Given 08/02/2021 2000)  iohexol (OMNIPAQUE) 350 MG/ML injection 100 mL (100 mLs Intravenous Contrast Given 07/07/2021 1941)    ED Course/ Medical Decision Making/ A&P Clinical Course as of 07/16/2021 2043  Sat Jul 31, 2021  1919 Starting on BiPAP for work of breathing. [RP]    Clinical Course User Index [RP] PaFransico MeadowMD                           Medical Decision Making Amount and/or Complexity of Data Reviewed Labs: ordered. Radiology: ordered.  Risk Prescription drug management.    Final Clinical Impression(s) / ED Diagnoses Final diagnoses:  None    Rx / DC Orders ED Discharge Orders     None

## 2021-07-31 NOTE — ED Notes (Signed)
TIME OF DEATH

## 2021-07-31 NOTE — ED Notes (Signed)
Became unresponsive while in CT.  Placed back on ED stretcher, carotid pulse palpable remains on bipap.  On arrival back to room patient became pulseless with agonal respirations.  CPR started and respirations assisted with BVM.

## 2021-07-31 NOTE — Progress Notes (Signed)
Pt transported to CT on BIPAP vitals remained stable throughout.  Upon completion of CT scans pt went unresponsive.  Pt moved back to room where CPR was initiated.  Pt intubated with 8.0 ET tube and secured at 25 by ED MD.

## 2021-07-31 NOTE — Code Documentation (Signed)
Time of death

## 2021-07-31 NOTE — Code Documentation (Addendum)
Pulse checked CPR restarted MD  Sharlett Iles going to speak with family

## 2021-07-31 NOTE — ED Notes (Signed)
Lactate > 9.0 read and verified with Dierdre Highman.

## 2021-08-03 ENCOUNTER — Other Ambulatory Visit: Payer: Medicare PPO

## 2021-08-03 NOTE — ED Notes (Signed)
In patients chart, as charge RN, to get patients wifes number. Patient left his watch behind.

## 2021-08-03 DEATH — deceased

## 2021-08-04 ENCOUNTER — Inpatient Hospital Stay: Payer: Medicare PPO

## 2021-08-05 LAB — CULTURE, BLOOD (ROUTINE X 2)
Culture: NO GROWTH
Culture: NO GROWTH
Special Requests: ADEQUATE
Special Requests: ADEQUATE

## 2021-08-09 ENCOUNTER — Ambulatory Visit: Payer: Medicare PPO | Admitting: Interventional Cardiology

## 2021-08-10 ENCOUNTER — Inpatient Hospital Stay: Payer: Medicare PPO

## 2021-08-12 ENCOUNTER — Inpatient Hospital Stay: Payer: Medicare PPO | Admitting: Physician Assistant
